# Patient Record
Sex: Male | Born: 1945
Health system: Southern US, Community
[De-identification: ages and names within clinical notes are randomized; demographics above are authoritative.]

## PROBLEM LIST (undated history)

## (undated) DIAGNOSIS — Z5189 Encounter for other specified aftercare: Secondary | ICD-10-CM

## (undated) DIAGNOSIS — H409 Unspecified glaucoma: Secondary | ICD-10-CM

## (undated) DIAGNOSIS — Z87442 Personal history of urinary calculi: Secondary | ICD-10-CM

## (undated) DIAGNOSIS — M1990 Unspecified osteoarthritis, unspecified site: Secondary | ICD-10-CM

## (undated) DIAGNOSIS — N400 Enlarged prostate without lower urinary tract symptoms: Secondary | ICD-10-CM

## (undated) DIAGNOSIS — T7840XA Allergy, unspecified, initial encounter: Secondary | ICD-10-CM

## (undated) DIAGNOSIS — C801 Malignant (primary) neoplasm, unspecified: Secondary | ICD-10-CM

## (undated) DIAGNOSIS — Z8719 Personal history of other diseases of the digestive system: Secondary | ICD-10-CM

## (undated) DIAGNOSIS — E785 Hyperlipidemia, unspecified: Secondary | ICD-10-CM

## (undated) DIAGNOSIS — I219 Acute myocardial infarction, unspecified: Secondary | ICD-10-CM

## (undated) DIAGNOSIS — N200 Calculus of kidney: Secondary | ICD-10-CM

## (undated) HISTORY — PX: OTHER SURGICAL HISTORY: SHX169

## (undated) HISTORY — DX: Benign prostatic hyperplasia without lower urinary tract symptoms: N40.0

## (undated) HISTORY — DX: Allergy, unspecified, initial encounter: T78.40XA

## (undated) HISTORY — DX: Unspecified glaucoma: H40.9

## (undated) HISTORY — DX: Acute myocardial infarction, unspecified: I21.9

## (undated) HISTORY — DX: Hyperlipidemia, unspecified: E78.5

## (undated) HISTORY — DX: Personal history of other diseases of the digestive system: Z87.19

## (undated) HISTORY — PX: COLONOSCOPY: SHX174

## (undated) HISTORY — DX: Encounter for other specified aftercare: Z51.89

## (undated) HISTORY — DX: Calculus of kidney: N20.0

## (undated) MED FILL — Dexamethasone Sodium Phosphate Inj 100 MG/10ML: INTRAMUSCULAR | Qty: 1 | Status: AC

---

## 2000-07-26 ENCOUNTER — Emergency Department (HOSPITAL_COMMUNITY): Admission: EM | Admit: 2000-07-26 | Discharge: 2000-07-26 | Payer: Self-pay | Admitting: Emergency Medicine

## 2000-11-27 ENCOUNTER — Other Ambulatory Visit: Admission: RE | Admit: 2000-11-27 | Discharge: 2000-11-27 | Payer: Self-pay | Admitting: Gastroenterology

## 2000-11-27 ENCOUNTER — Encounter (INDEPENDENT_AMBULATORY_CARE_PROVIDER_SITE_OTHER): Payer: Self-pay | Admitting: Specialist

## 2002-05-13 ENCOUNTER — Encounter: Admission: RE | Admit: 2002-05-13 | Discharge: 2002-05-13 | Payer: Self-pay | Admitting: Internal Medicine

## 2002-05-13 ENCOUNTER — Encounter: Payer: Self-pay | Admitting: Internal Medicine

## 2002-05-13 ENCOUNTER — Ambulatory Visit (HOSPITAL_COMMUNITY): Admission: RE | Admit: 2002-05-13 | Discharge: 2002-05-13 | Payer: Self-pay | Admitting: Internal Medicine

## 2003-08-01 ENCOUNTER — Emergency Department (HOSPITAL_COMMUNITY): Admission: EM | Admit: 2003-08-01 | Discharge: 2003-08-01 | Payer: Self-pay | Admitting: Family Medicine

## 2004-01-19 ENCOUNTER — Ambulatory Visit: Payer: Self-pay | Admitting: Gastroenterology

## 2004-01-29 ENCOUNTER — Ambulatory Visit: Payer: Self-pay | Admitting: Gastroenterology

## 2008-12-22 ENCOUNTER — Encounter (INDEPENDENT_AMBULATORY_CARE_PROVIDER_SITE_OTHER): Payer: Self-pay | Admitting: *Deleted

## 2009-01-12 ENCOUNTER — Encounter (INDEPENDENT_AMBULATORY_CARE_PROVIDER_SITE_OTHER): Payer: Self-pay | Admitting: *Deleted

## 2009-01-13 ENCOUNTER — Encounter (INDEPENDENT_AMBULATORY_CARE_PROVIDER_SITE_OTHER): Payer: Self-pay | Admitting: *Deleted

## 2009-01-13 ENCOUNTER — Ambulatory Visit: Payer: Self-pay | Admitting: Gastroenterology

## 2009-01-27 ENCOUNTER — Ambulatory Visit: Payer: Self-pay | Admitting: Gastroenterology

## 2009-02-10 ENCOUNTER — Encounter: Payer: Self-pay | Admitting: Gastroenterology

## 2010-05-18 LAB — GLUCOSE, CAPILLARY
Glucose-Capillary: 117 mg/dL — ABNORMAL HIGH (ref 70–99)
Glucose-Capillary: 127 mg/dL — ABNORMAL HIGH (ref 70–99)

## 2011-03-16 ENCOUNTER — Encounter (HOSPITAL_COMMUNITY): Payer: Self-pay | Admitting: Pharmacy Technician

## 2011-03-21 ENCOUNTER — Other Ambulatory Visit: Payer: Self-pay | Admitting: Orthopedic Surgery

## 2011-03-22 ENCOUNTER — Other Ambulatory Visit: Payer: Self-pay

## 2011-03-22 ENCOUNTER — Encounter (HOSPITAL_COMMUNITY)
Admission: RE | Admit: 2011-03-22 | Discharge: 2011-03-22 | Disposition: A | Discharge status: Home / Self Care | Payer: Medicare Other | Source: Ambulatory Visit | Attending: Orthopedic Surgery | Admitting: Orthopedic Surgery

## 2011-03-22 ENCOUNTER — Encounter (HOSPITAL_COMMUNITY): Payer: Self-pay

## 2011-03-22 HISTORY — DX: Unspecified osteoarthritis, unspecified site: M19.90

## 2011-03-22 LAB — COMPREHENSIVE METABOLIC PANEL
ALT: 20 U/L (ref 0–53)
Albumin: 4.2 g/dL (ref 3.5–5.2)
Alkaline Phosphatase: 57 U/L (ref 39–117)
Glucose, Bld: 92 mg/dL (ref 70–99)
Potassium: 4.2 mEq/L (ref 3.5–5.1)
Sodium: 138 mEq/L (ref 135–145)
Total Protein: 8 g/dL (ref 6.0–8.3)

## 2011-03-22 LAB — URINALYSIS, ROUTINE W REFLEX MICROSCOPIC
Bilirubin Urine: NEGATIVE
Hgb urine dipstick: NEGATIVE
Nitrite: NEGATIVE
Specific Gravity, Urine: 1.026 (ref 1.005–1.030)
pH: 5 (ref 5.0–8.0)

## 2011-03-22 LAB — DIFFERENTIAL
Eosinophils Absolute: 0.2 10*3/uL (ref 0.0–0.7)
Eosinophils Relative: 2 % (ref 0–5)
Lymphs Abs: 2.3 10*3/uL (ref 0.7–4.0)
Monocytes Absolute: 0.6 10*3/uL (ref 0.1–1.0)
Monocytes Relative: 7 % (ref 3–12)

## 2011-03-22 LAB — CBC
Hemoglobin: 13.2 g/dL (ref 13.0–17.0)
MCHC: 32.2 g/dL (ref 30.0–36.0)
RDW: 14.7 % (ref 11.5–15.5)
WBC: 8 10*3/uL (ref 4.0–10.5)

## 2011-03-22 LAB — APTT: aPTT: 34 seconds (ref 24–37)

## 2011-03-22 MED ORDER — CHLORHEXIDINE GLUCONATE 4 % EX LIQD: 60.0000 mL | Freq: Once | CUTANEOUS | Status: DC

## 2011-03-22 MED ORDER — SODIUM CHLORIDE 0.9 % IV SOLN: INTRAVENOUS | Status: DC

## 2011-03-22 NOTE — Progress Notes (Signed)
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 AND WOULD PREFER NOT TO WEAR THE BLUE BLOOD BAND FOR  13 DAYS...WILL DRAW DOS

## 2011-03-22 NOTE — Pre-Procedure Instructions (Signed)
20 David Kelly  03/22/2011   Your procedure is scheduled on: Monday, FEB 18th  Report to [facility] at  5:30 AM.  Call this number if you have problems the morning of surgery: [phone]   Remember:   Do not eat food:After Midnight Tuesday.  May have clear liquids: up to 4 Hours before arrival time  (1:30AM).  Clear liquids include soda, tea, black coffee, apple or grape juice, broth.   Take these medicines the morning of surgery with A SIP OF WATER: METOPROLOL   Do not wear jewelry, make-up or nail polish.   Do not wear lotions, powders, or perfumes. You may wear deodorant.   Do not bring valuables to the hospital.   Contacts, dentures or bridgework may not be worn into surgery.  Leave suitcase in the car. After surgery it may be brought to your room.  For patients admitted to the hospital, checkout time is 11:00 AM the day of discharge.   Patients discharged the day of surgery will not be allowed to drive home.  Name and phone number of your driver:  David Kelly  --  [phone]*  Special Instructions: CHG Shower Use Special Wash: 1/2 bottle night before surgery and 1/2 bottle morning of surgery.   Please read over the following fact sheets that you were given: Pain Booklet, MRSA Information and Surgical Site Infection Prevention

## 2011-03-23 LAB — URINE CULTURE
Culture  Setup Time: 201302051154
Culture: NO GROWTH

## 2011-03-30 ENCOUNTER — Other Ambulatory Visit: Payer: Self-pay | Admitting: Orthopedic Surgery

## 2011-03-30 NOTE — H&P (Signed)
  David Kelly MRN:  [id number] DOB/SEX:  [birth year]/male  CHIEF COMPLAINT:  Painful left Knee  HISTORY: Patient is a 66 y.o. male presented with a history of pain in the left knee. Onset of symptoms was gradual starting several years ago with gradually worsening course since that time. The patient noted no past surgery on the left knee. Prior procedures on the knee include meniscectomy. Patient has been treated conservatively with over-the-counter NSAIDs and activity modification. Patient currently rates pain in the knee at 8 out of 10 with activity. There is pain at night.  PAST MEDICAL HISTORY: There are no active problems to display for this patient.  Past Medical History  Diagnosis Date  . Bronchitis, chronic   . Diabetes mellitus     DX  5 YR AGO  . Arthritis    No past surgical history on file.   MEDICATIONS:   (Not in a hospital admission)  ALLERGIES:  No Known Allergies  REVIEW OF SYSTEMS:  Pertinent items are noted in HPI.   FAMILY HISTORY:  No family history on file.  SOCIAL HISTORY:   History  Substance Use Topics  . Smoking status: Former Smoker -- 1.5 packs/day for 45 years    Types: Cigarettes    Quit date: 03/21/2006  . Smokeless tobacco: Not on file  . Alcohol Use: No     EXAMINATION:  Vital signs in last 24 hours: @VSRANGES @  General appearance: alert, cooperative and no distress Lungs: clear to auscultation bilaterally Heart: regular rate and rhythm, S1, S2 normal, no murmur, click, rub or gallop Abdomen: soft, non-tender; bowel sounds normal; no masses,  no organomegaly Extremities: Homans sign is negative, no sign of DVT Pulses: 2+ and symmetric Skin: Skin color, texture, turgor normal. No rashes or lesions Neurologic: Alert and oriented X 3, normal strength and tone. Normal symmetric reflexes. Normal coordination and gait  Musculoskeletal:  ROM 0-90, Ligaments INTACT,  Imaging Review Plain radiographs demonstrate severe degenerative  joint disease of the left knee. The overall alignment is mild varus. The bone quality appears to be good for age and reported activity level.  Assessment/Plan: End stage arthritis, left knee   The patient history, physical examination and imaging studies are consistent with advanced degenerative joint disease of the left knee. The patient has failed conservative treatment.  The clearance notes were reviewed.  After discussion with the patient it was felt that Total Knee Replacement was indicated. The procedure,  risks, and benefits of total knee arthroplasty were presented and reviewed. The risks including but not limited to aseptic loosening, infection, blood clots, vascular injury, stiffness, patella tracking problems complications among others were discussed. The patient acknowledged the explanation, agreed to proceed with the plan.  [name] 03/30/2011, 9:19 AM

## 2011-04-03 MED ORDER — CEFAZOLIN SODIUM-DEXTROSE 2-3 GM-% IV SOLR
2.0000 g | INTRAVENOUS | Status: AC
  Administered 2011-04-04: 2 g via INTRAVENOUS
  Filled 2011-04-03: qty 50

## 2011-04-03 MED ORDER — ACETAMINOPHEN 10 MG/ML IV SOLN
1000.0000 mg | Freq: Four times a day (QID) | INTRAVENOUS | Status: DC
  Administered 2011-04-04: 1000 mg via INTRAVENOUS
  Filled 2011-04-03 (×4): qty 100

## 2011-04-04 ENCOUNTER — Inpatient Hospital Stay (HOSPITAL_COMMUNITY)
Admission: RE | Admit: 2011-04-04 | Discharge: 2011-04-06 | DRG: 470 | Disposition: A | Discharge status: Home Health | Payer: Medicare Other | Source: Ambulatory Visit | Attending: Orthopedic Surgery | Admitting: Orthopedic Surgery

## 2011-04-04 ENCOUNTER — Ambulatory Visit (HOSPITAL_COMMUNITY): Payer: Medicare Other | Admitting: Registered Nurse

## 2011-04-04 ENCOUNTER — Encounter (HOSPITAL_COMMUNITY): Payer: Self-pay | Admitting: Registered Nurse

## 2011-04-04 ENCOUNTER — Encounter (HOSPITAL_COMMUNITY): Payer: Self-pay | Admitting: Surgery

## 2011-04-04 ENCOUNTER — Encounter (HOSPITAL_COMMUNITY)
Admission: RE | Disposition: A | Discharge status: Home Health | Payer: Self-pay | Source: Ambulatory Visit | Attending: Orthopedic Surgery

## 2011-04-04 DIAGNOSIS — M1710 Unilateral primary osteoarthritis, unspecified knee: Principal | ICD-10-CM | POA: Diagnosis present

## 2011-04-04 DIAGNOSIS — M1712 Unilateral primary osteoarthritis, left knee: Secondary | ICD-10-CM

## 2011-04-04 DIAGNOSIS — Z23 Encounter for immunization: Secondary | ICD-10-CM

## 2011-04-04 DIAGNOSIS — Z79899 Other long term (current) drug therapy: Secondary | ICD-10-CM

## 2011-04-04 DIAGNOSIS — E119 Type 2 diabetes mellitus without complications: Secondary | ICD-10-CM | POA: Diagnosis present

## 2011-04-04 DIAGNOSIS — Z87891 Personal history of nicotine dependence: Secondary | ICD-10-CM

## 2011-04-04 DIAGNOSIS — D62 Acute posthemorrhagic anemia: Secondary | ICD-10-CM | POA: Diagnosis not present

## 2011-04-04 HISTORY — PX: TOTAL KNEE ARTHROPLASTY: SHX125

## 2011-04-04 LAB — GLUCOSE, CAPILLARY
Glucose-Capillary: 126 mg/dL — ABNORMAL HIGH (ref 70–99)
Glucose-Capillary: 114 mg/dL — ABNORMAL HIGH (ref 70–99)

## 2011-04-04 LAB — TYPE AND SCREEN: Antibody Screen: NEGATIVE

## 2011-04-04 LAB — ABO/RH: ABO/RH(D): A POS

## 2011-04-04 SURGERY — ARTHROPLASTY, KNEE, TOTAL
Anesthesia: General | Site: Knee | Laterality: Left | Wound class: Clean

## 2011-04-04 MED ORDER — INFLUENZA VIRUS VACC SPLIT PF IM SUSP
0.5000 mL | INTRAMUSCULAR | Status: AC
  Filled 2011-04-04: qty 0.5

## 2011-04-04 MED ORDER — PROPOFOL 10 MG/ML IV EMUL
INTRAVENOUS | Status: DC | PRN
  Administered 2011-04-04: 200 mg via INTRAVENOUS

## 2011-04-04 MED ORDER — METFORMIN HCL 850 MG PO TABS
850.0000 mg | ORAL_TABLET | Freq: Two times a day (BID) | ORAL | Status: DC
  Administered 2011-04-04 – 2011-04-06 (×4): 850 mg via ORAL
  Filled 2011-04-04 (×6): qty 1

## 2011-04-04 MED ORDER — SENNOSIDES-DOCUSATE SODIUM 8.6-50 MG PO TABS: 1.0000 | ORAL_TABLET | Freq: Every evening | ORAL | Status: DC | PRN

## 2011-04-04 MED ORDER — OXYCODONE HCL 10 MG PO TB12
10.0000 mg | ORAL_TABLET | Freq: Two times a day (BID) | ORAL | Status: DC
  Administered 2011-04-04 – 2011-04-06 (×4): 10 mg via ORAL
  Filled 2011-04-04 (×4): qty 1

## 2011-04-04 MED ORDER — DIPHENHYDRAMINE HCL 12.5 MG/5ML PO ELIX
12.5000 mg | ORAL_SOLUTION | ORAL | Status: DC | PRN
Start: 2011-04-04 — End: 2011-04-06
  Filled 2011-04-04: qty 10

## 2011-04-04 MED ORDER — SODIUM CHLORIDE 0.9 % IV SOLN
INTRAVENOUS | Status: DC
  Administered 2011-04-04: 1000 mL via INTRAVENOUS
  Administered 2011-04-04 – 2011-04-05 (×2): via INTRAVENOUS

## 2011-04-04 MED ORDER — BUPIVACAINE-EPINEPHRINE PF 0.5-1:200000 % IJ SOLN
INTRAMUSCULAR | Status: DC | PRN
  Administered 2011-04-04: 150 mg

## 2011-04-04 MED ORDER — ONDANSETRON HCL 4 MG PO TABS: 4.0000 mg | ORAL_TABLET | Freq: Four times a day (QID) | ORAL | Status: DC | PRN

## 2011-04-04 MED ORDER — ACETAMINOPHEN 10 MG/ML IV SOLN
INTRAVENOUS | Status: AC
  Filled 2011-04-04: qty 100

## 2011-04-04 MED ORDER — BUPIVACAINE-EPINEPHRINE 0.25% -1:200000 IJ SOLN
INTRAMUSCULAR | Status: DC | PRN
  Administered 2011-04-04: 20 mL

## 2011-04-04 MED ORDER — DOCUSATE SODIUM 100 MG PO CAPS
100.0000 mg | ORAL_CAPSULE | Freq: Two times a day (BID) | ORAL | Status: DC
  Administered 2011-04-04 – 2011-04-06 (×4): 100 mg via ORAL
  Filled 2011-04-04 (×6): qty 1

## 2011-04-04 MED ORDER — BUPIVACAINE 0.25 % ON-Q PUMP SINGLE CATH 300ML
300.0000 mL | INJECTION | Status: AC
  Administered 2011-04-04: 300 mL
  Filled 2011-04-04: qty 300

## 2011-04-04 MED ORDER — METOCLOPRAMIDE HCL 5 MG/ML IJ SOLN
5.0000 mg | Freq: Three times a day (TID) | INTRAMUSCULAR | Status: DC | PRN
  Filled 2011-04-04: qty 2

## 2011-04-04 MED ORDER — SODIUM CHLORIDE 0.9 % IR SOLN
Status: DC | PRN
  Administered 2011-04-04: 1000 mL

## 2011-04-04 MED ORDER — PHENOL 1.4 % MT LIQD
1.0000 | OROMUCOSAL | Status: DC | PRN
  Filled 2011-04-04: qty 177

## 2011-04-04 MED ORDER — METHOCARBAMOL 500 MG PO TABS
500.0000 mg | ORAL_TABLET | Freq: Four times a day (QID) | ORAL | Status: DC | PRN
  Administered 2011-04-05 – 2011-04-06 (×2): 500 mg via ORAL
  Filled 2011-04-04 (×3): qty 1

## 2011-04-04 MED ORDER — DROPERIDOL 2.5 MG/ML IJ SOLN: 0.6250 mg | INTRAMUSCULAR | Status: DC | PRN

## 2011-04-04 MED ORDER — ALUM & MAG HYDROXIDE-SIMETH 200-200-20 MG/5ML PO SUSP: 30.0000 mL | ORAL | Status: DC | PRN

## 2011-04-04 MED ORDER — ACETAMINOPHEN 325 MG PO TABS: 650.0000 mg | ORAL_TABLET | Freq: Four times a day (QID) | ORAL | Status: DC | PRN

## 2011-04-04 MED ORDER — METOPROLOL TARTRATE 25 MG PO TABS
25.0000 mg | ORAL_TABLET | Freq: Two times a day (BID) | ORAL | Status: DC
  Administered 2011-04-04 – 2011-04-06 (×3): 25 mg via ORAL
  Filled 2011-04-04 (×5): qty 1

## 2011-04-04 MED ORDER — BUPIVACAINE ON-Q PAIN PUMP (FOR ORDER SET NO CHG)
INJECTION | Status: DC
  Filled 2011-04-04: qty 1

## 2011-04-04 MED ORDER — OXYCODONE HCL 5 MG PO TABS
5.0000 mg | ORAL_TABLET | ORAL | Status: DC | PRN
  Administered 2011-04-05 – 2011-04-06 (×5): 10 mg via ORAL
  Filled 2011-04-04 (×6): qty 2

## 2011-04-04 MED ORDER — BISACODYL 5 MG PO TBEC: 5.0000 mg | DELAYED_RELEASE_TABLET | Freq: Every day | ORAL | Status: DC | PRN

## 2011-04-04 MED ORDER — ONDANSETRON HCL 4 MG/2ML IJ SOLN
INTRAMUSCULAR | Status: DC | PRN
  Administered 2011-04-04: 4 mg via INTRAVENOUS

## 2011-04-04 MED ORDER — MENTHOL 3 MG MT LOZG: 1.0000 | LOZENGE | OROMUCOSAL | Status: DC | PRN

## 2011-04-04 MED ORDER — FENTANYL CITRATE 0.05 MG/ML IJ SOLN
INTRAMUSCULAR | Status: DC | PRN
  Administered 2011-04-04: 100 ug via INTRAVENOUS
  Administered 2011-04-04 (×3): 50 ug via INTRAVENOUS

## 2011-04-04 MED ORDER — LACTATED RINGERS IV SOLN
INTRAVENOUS | Status: DC | PRN
  Administered 2011-04-04 (×2): via INTRAVENOUS

## 2011-04-04 MED ORDER — ZOLPIDEM TARTRATE 5 MG PO TABS: 5.0000 mg | ORAL_TABLET | Freq: Every evening | ORAL | Status: DC | PRN

## 2011-04-04 MED ORDER — SIMVASTATIN 20 MG PO TABS
20.0000 mg | ORAL_TABLET | Freq: Every day | ORAL | Status: DC
  Administered 2011-04-04 – 2011-04-05 (×2): 20 mg via ORAL
  Filled 2011-04-04 (×3): qty 1

## 2011-04-04 MED ORDER — ACETAMINOPHEN 650 MG RE SUPP: 650.0000 mg | Freq: Four times a day (QID) | RECTAL | Status: DC | PRN

## 2011-04-04 MED ORDER — CELECOXIB 200 MG PO CAPS
200.0000 mg | ORAL_CAPSULE | Freq: Two times a day (BID) | ORAL | Status: DC
  Administered 2011-04-04 – 2011-04-06 (×4): 200 mg via ORAL
  Filled 2011-04-04 (×6): qty 1

## 2011-04-04 MED ORDER — MIDAZOLAM HCL 5 MG/5ML IJ SOLN
INTRAMUSCULAR | Status: DC | PRN
  Administered 2011-04-04 (×2): 1 mg via INTRAVENOUS

## 2011-04-04 MED ORDER — HYDROMORPHONE HCL PF 1 MG/ML IJ SOLN
0.2500 mg | INTRAMUSCULAR | Status: DC | PRN
  Administered 2011-04-04 (×2): 0.5 mg via INTRAVENOUS

## 2011-04-04 MED ORDER — ONDANSETRON HCL 4 MG/2ML IJ SOLN: 4.0000 mg | Freq: Four times a day (QID) | INTRAMUSCULAR | Status: DC | PRN

## 2011-04-04 MED ORDER — DEXTROSE 5 % IV SOLN
500.0000 mg | Freq: Four times a day (QID) | INTRAVENOUS | Status: DC | PRN
  Filled 2011-04-04: qty 5

## 2011-04-04 MED ORDER — METOCLOPRAMIDE HCL 10 MG PO TABS: 5.0000 mg | ORAL_TABLET | Freq: Three times a day (TID) | ORAL | Status: DC | PRN

## 2011-04-04 MED ORDER — HYDROMORPHONE HCL PF 1 MG/ML IJ SOLN
0.5000 mg | INTRAMUSCULAR | Status: DC | PRN
  Administered 2011-04-04 – 2011-04-05 (×4): 1 mg via INTRAVENOUS
  Filled 2011-04-04 (×4): qty 1

## 2011-04-04 MED ORDER — CEFAZOLIN SODIUM-DEXTROSE 2-3 GM-% IV SOLR
2.0000 g | Freq: Four times a day (QID) | INTRAVENOUS | Status: AC
  Administered 2011-04-04 – 2011-04-05 (×3): 2 g via INTRAVENOUS
  Filled 2011-04-04 (×3): qty 50

## 2011-04-04 MED ORDER — FLEET ENEMA 7-19 GM/118ML RE ENEM: 1.0000 | ENEMA | Freq: Once | RECTAL | Status: AC | PRN

## 2011-04-04 MED ORDER — ENOXAPARIN SODIUM 40 MG/0.4ML ~~LOC~~ SOLN
40.0000 mg | SUBCUTANEOUS | Status: DC
  Administered 2011-04-05 – 2011-04-06 (×2): 40 mg via SUBCUTANEOUS
  Filled 2011-04-04 (×3): qty 0.4

## 2011-04-04 SURGICAL SUPPLY — 63 items
BANDAGE ACE 4 STERILE (GAUZE/BANDAGES/DRESSINGS) ×1 IMPLANT
BANDAGE ELASTIC 6 VELCRO ST LF (GAUZE/BANDAGES/DRESSINGS) ×1 IMPLANT
BANDAGE ESMARK 6X9 LF (GAUZE/BANDAGES/DRESSINGS) ×1 IMPLANT
BLADE SAGITTAL 13X1.27X60 (BLADE) ×2 IMPLANT
BLADE SAW SGTL 83.5X18.5 (BLADE) ×2 IMPLANT
BNDG CMPR 9X6 STRL LF SNTH (GAUZE/BANDAGES/DRESSINGS) ×1
BNDG ESMARK 6X9 LF (GAUZE/BANDAGES/DRESSINGS) ×2
BOWL SMART MIX CTS (DISPOSABLE) ×2 IMPLANT
CATH KIT ON Q 10IN SLV (PAIN MANAGEMENT) ×2 IMPLANT
CEMENT BONE SIMPLEX SPEEDSET (Cement) ×4 IMPLANT
CLOTH BEACON ORANGE TIMEOUT ST (SAFETY) ×2 IMPLANT
COVER BACK TABLE 24X17X13 BIG (DRAPES) ×1 IMPLANT
COVER SURGICAL LIGHT HANDLE (MISCELLANEOUS) ×3 IMPLANT
CUFF TOURNIQUET SINGLE 34IN LL (TOURNIQUET CUFF) ×2 IMPLANT
DRAPE EXTREMITY T 121X128X90 (DRAPE) ×2 IMPLANT
DRAPE INCISE IOBAN 66X45 STRL (DRAPES) ×4 IMPLANT
DRAPE PROXIMA HALF (DRAPES) ×2 IMPLANT
DRAPE U-SHAPE 47X51 STRL (DRAPES) ×2 IMPLANT
DRSG ADAPTIC 3X8 NADH LF (GAUZE/BANDAGES/DRESSINGS) ×2 IMPLANT
DRSG PAD ABDOMINAL 8X10 ST (GAUZE/BANDAGES/DRESSINGS) ×2 IMPLANT
DURAPREP 26ML APPLICATOR (WOUND CARE) ×4 IMPLANT
ELECT REM PT RETURN 9FT ADLT (ELECTROSURGICAL) ×2
ELECTRODE REM PT RTRN 9FT ADLT (ELECTROSURGICAL) ×1 IMPLANT
EVACUATOR 1/8 PVC DRAIN (DRAIN) ×2 IMPLANT
GLOVE BIO SURGEON STRL SZ8.5 (GLOVE) ×1 IMPLANT
GLOVE BIOGEL M 7.0 STRL (GLOVE) ×1 IMPLANT
GLOVE BIOGEL PI IND STRL 7.5 (GLOVE) IMPLANT
GLOVE BIOGEL PI IND STRL 8.5 (GLOVE) ×2 IMPLANT
GLOVE BIOGEL PI INDICATOR 7.5 (GLOVE) ×1
GLOVE BIOGEL PI INDICATOR 8.5 (GLOVE) ×2
GLOVE SURG ORTHO 8.0 STRL STRW (GLOVE) ×4 IMPLANT
GLOVE SURG SS PI 7.5 STRL IVOR (GLOVE) ×1 IMPLANT
GLOVE SURG SS PI 8.5 STRL IVOR (GLOVE) ×1
GLOVE SURG SS PI 8.5 STRL STRW (GLOVE) IMPLANT
GOWN PREVENTION PLUS XLARGE (GOWN DISPOSABLE) ×4 IMPLANT
GOWN PREVENTION PLUS XXLARGE (GOWN DISPOSABLE) ×1 IMPLANT
GOWN STRL NON-REIN LRG LVL3 (GOWN DISPOSABLE) ×3 IMPLANT
HANDPIECE INTERPULSE COAX TIP (DISPOSABLE) ×2
HOOD PEEL AWAY FACE SHEILD DIS (HOOD) ×8 IMPLANT
KIT BASIN OR (CUSTOM PROCEDURE TRAY) ×2 IMPLANT
KIT ROOM TURNOVER OR (KITS) ×2 IMPLANT
MANIFOLD NEPTUNE II (INSTRUMENTS) ×2 IMPLANT
NEEDLE 22X1 1/2 (OR ONLY) (NEEDLE) ×1 IMPLANT
NS IRRIG 1000ML POUR BTL (IV SOLUTION) ×2 IMPLANT
PACK TOTAL JOINT (CUSTOM PROCEDURE TRAY) ×2 IMPLANT
PAD ARMBOARD 7.5X6 YLW CONV (MISCELLANEOUS) ×4 IMPLANT
PADDING CAST COTTON 6X4 STRL (CAST SUPPLIES) ×2 IMPLANT
POSITIONER HEAD PRONE TRACH (MISCELLANEOUS) ×2 IMPLANT
SET HNDPC FAN SPRY TIP SCT (DISPOSABLE) ×1 IMPLANT
SPONGE GAUZE 4X4 12PLY (GAUZE/BANDAGES/DRESSINGS) ×2 IMPLANT
STAPLER VISISTAT 35W (STAPLE) ×2 IMPLANT
SUCTION FRAZIER TIP 10 FR DISP (SUCTIONS) ×2 IMPLANT
SUT BONE WAX W31G (SUTURE) ×2 IMPLANT
SUT VIC AB 0 CTB1 27 (SUTURE) ×4 IMPLANT
SUT VIC AB 1 CT1 27 (SUTURE) ×8
SUT VIC AB 1 CT1 27XBRD ANBCTR (SUTURE) ×4 IMPLANT
SUT VIC AB 2-0 CT1 27 (SUTURE) ×4
SUT VIC AB 2-0 CT1 TAPERPNT 27 (SUTURE) ×2 IMPLANT
SYR CONTROL 10ML LL (SYRINGE) ×1 IMPLANT
TOWEL OR 17X24 6PK STRL BLUE (TOWEL DISPOSABLE) ×2 IMPLANT
TOWEL OR 17X26 10 PK STRL BLUE (TOWEL DISPOSABLE) ×2 IMPLANT
TRAY FOLEY CATH 14FR (SET/KITS/TRAYS/PACK) ×1 IMPLANT
WATER STERILE IRR 1000ML POUR (IV SOLUTION) ×5 IMPLANT

## 2011-04-04 NOTE — Anesthesia Postprocedure Evaluation (Signed)
Anesthesia Post Note  Patient: David Kelly  Procedure(s) Performed: Procedure(s) (LRB): TOTAL KNEE ARTHROPLASTY (Left)  Anesthesia type: general  Patient location: PACU  Post pain: Pain level controlled  Post assessment: Patient's Cardiovascular Status Stable  Last Vitals:  Filed Vitals:   04/04/11 1000  BP:   Pulse: 81  Temp:   Resp: 13    Post vital signs: Reviewed and stable  Level of consciousness: sedated  Complications: No apparent anesthesia complications

## 2011-04-04 NOTE — H&P (View-Only) (Signed)
  David Kelly MRN:  [id number] DOB/SEX:  10/18/1945/male  CHIEF COMPLAINT:  Painful left Knee  HISTORY: Patient is a 66 y.o. male presented with a history of pain in the left knee. Onset of symptoms was gradual starting several years ago with gradually worsening course since that time. The patient noted no past surgery on the left knee. Prior procedures on the knee include meniscectomy. Patient has been treated conservatively with over-the-counter NSAIDs and activity modification. Patient currently rates pain in the knee at 8 out of 10 with activity. There is pain at night.  PAST MEDICAL HISTORY: There are no active problems to display for this patient.  Past Medical History  Diagnosis Date  . Bronchitis, chronic   . Diabetes mellitus     DX  5 YR AGO  . Arthritis    No past surgical history on file.   MEDICATIONS:   (Not in a hospital admission)  ALLERGIES:  No Known Allergies  REVIEW OF SYSTEMS:  Pertinent items are noted in HPI.   FAMILY HISTORY:  No family history on file.  SOCIAL HISTORY:   History  Substance Use Topics  . Smoking status: Former Smoker -- 1.5 packs/day for 45 years    Types: Cigarettes    Quit date: 03/21/2006  . Smokeless tobacco: Not on file  . Alcohol Use: No     EXAMINATION:  Vital signs in last 24 hours: @VSRANGES@  General appearance: alert, cooperative and no distress Lungs: clear to auscultation bilaterally Heart: regular rate and rhythm, S1, S2 normal, no murmur, click, rub or gallop Abdomen: soft, non-tender; bowel sounds normal; no masses,  no organomegaly Extremities: Homans sign is negative, no sign of DVT Pulses: 2+ and symmetric Skin: Skin color, texture, turgor normal. No rashes or lesions Neurologic: Alert and oriented X 3, normal strength and tone. Normal symmetric reflexes. Normal coordination and gait  Musculoskeletal:  ROM 0-90, Ligaments INTACT,  Imaging Review Plain radiographs demonstrate severe degenerative  joint disease of the left knee. The overall alignment is mild varus. The bone quality appears to be good for age and reported activity level.  Assessment/Plan: End stage arthritis, left knee   The patient history, physical examination and imaging studies are consistent with advanced degenerative joint disease of the left knee. The patient has failed conservative treatment.  The clearance notes were reviewed.  After discussion with the patient it was felt that Total Knee Replacement was indicated. The procedure,  risks, and benefits of total knee arthroplasty were presented and reviewed. The risks including but not limited to aseptic loosening, infection, blood clots, vascular injury, stiffness, patella tracking problems complications among others were discussed. The patient acknowledged the explanation, agreed to proceed with the plan.  David Kelly 03/30/2011, 9:19 AM   

## 2011-04-04 NOTE — Progress Notes (Signed)
Orthopedic Tech Progress Note Patient Details:  David Kelly June 06, 1945 [id number]  CPM Left Knee CPM Left Knee: On Left Knee Flexion (Degrees): 90  Left Knee Extension (Degrees): 0    David Kelly 04/04/2011, 10:47 AM

## 2011-04-04 NOTE — Anesthesia Preprocedure Evaluation (Signed)
Anesthesia Evaluation  Patient identified by MRN, date of birth, ID band Patient awake    Reviewed: Allergy & Precautions, H&P , NPO status , Patient's Chart, lab work & pertinent test results, reviewed documented beta blocker date and time   History of Anesthesia Complications Negative for: history of anesthetic complications  Airway Mallampati: II TM Distance: >3 FB Neck ROM: Full    Dental  (+) Poor Dentition and Dental Advisory Given   Pulmonary former smoker clear to auscultation  Pulmonary exam normal       Cardiovascular neg cardio ROS Regular Normal- Systolic murmurs    Neuro/Psych Negative Neurological ROS  Negative Psych ROS   GI/Hepatic negative GI ROS, Neg liver ROS,   Endo/Other  Diabetes mellitus-, Type 2, Oral Hypoglycemic Agents  Renal/GU negative Renal ROS     Musculoskeletal   Abdominal   Peds  Hematology   Anesthesia Other Findings   Reproductive/Obstetrics                           Anesthesia Physical Anesthesia Plan  ASA: II  Anesthesia Plan: General   Post-op Pain Management:    Induction: Intravenous  Airway Management Planned: LMA  Additional Equipment:   Intra-op Plan:   Post-operative Plan:   Informed Consent: I have reviewed the patients History and Physical, chart, labs and discussed the procedure including the risks, benefits and alternatives for the proposed anesthesia with the patient or authorized representative who has indicated his/her understanding and acceptance.   Dental advisory given  Plan Discussed with: CRNA, Anesthesiologist and Surgeon  Anesthesia Plan Comments:         Anesthesia Quick Evaluation

## 2011-04-04 NOTE — Progress Notes (Signed)
Orthopedic Tech Progress Note Patient Details:  JATAVIUS ELLENWOOD Feb 06, 1946 [id number]  Patient ID: Delene Ruffini, male   DOB: 03-29-1945, 66 y.o.   MRN: [id number]   Shawnie Pons 04/04/2011, 10:47 AM Trapeze bar

## 2011-04-04 NOTE — Progress Notes (Signed)
Pt took Metformin this AM.  Pt's CBG this AM was 103.  Notified anesthesia per note on chart.//L. LLove,RN

## 2011-04-04 NOTE — Interval H&P Note (Signed)
History and Physical Interval Note:  04/04/2011 7:14 AM  David Kelly  has presented today for surgery, with the diagnosis of OA Left knee   The various methods of treatment have been discussed with the patient and family. After consideration of risks, benefits and other options for treatment, the patient has consented to  Procedure(s) (LRB): TOTAL KNEE ARTHROPLASTY (Left) as a surgical intervention .  The patients' history has been reviewed, patient examined, no change in status, stable for surgery.  I have reviewed the patients' chart and labs.  Questions were answered to the patient's satisfaction.     [name],STEPHEN D

## 2011-04-04 NOTE — Plan of Care (Signed)
Problem: Consults Goal: Diagnosis- Total Joint Replacement Primary Total Knee     

## 2011-04-04 NOTE — Transfer of Care (Signed)
Immediate Anesthesia Transfer of Care Note  Patient: David Kelly  Procedure(s) Performed: Procedure(s) (LRB): TOTAL KNEE ARTHROPLASTY (Left)  Patient Location: PACU  Anesthesia Type: General  Level of Consciousness: awake and alert   Airway & Oxygen Therapy: Patient Spontanous Breathing and Patient connected to nasal cannula oxygen  Post-op Assessment: Report given to PACU RN  Post vital signs: Reviewed  Complications: No apparent anesthesia complications

## 2011-04-04 NOTE — Anesthesia Procedure Notes (Signed)
Anesthesia Regional Block:  Femoral nerve block  Pre-Anesthetic Checklist: ,, timeout performed, Correct Patient, Correct Site, Correct Laterality, Correct Procedure,, site marked, risks and benefits discussed, Surgical consent,  Pre-op evaluation,  At surgeon's request and post-op pain management  Laterality: Left  Prep: chloraprep       Needles:  Injection technique: Single-shot  Needle Type: Echogenic Stimulator Needle     Needle Length: 5cm 5 cm Needle Gauge: 22 and 22 G    Additional Needles:  Procedures: ultrasound guided and nerve stimulator Femoral nerve block  Nerve Stimulator or Paresthesia:  Response: quadraceps contraction, 0.45 mA,   Additional Responses:   Narrative:  Start time: 04/04/2011 7:02 AM End time: 04/04/2011 7:13 AM Injection made incrementally with aspirations every 5 mL.  Performed by: Personally  Anesthesiologist: Halford Decamp, MD  Additional Notes: Functioning IV was confirmed and monitors were applied.  A 50mm 22ga Arrow echogenic stimulator needle was used. Sterile prep and drape,hand hygiene and sterile gloves were used. Ultrasound guidance: relevent anatomy identified, needle position confirmed, local anesthetic spread visualized around nerve(s)., vascular puncture avoided.  Image printed for medical record. Negative aspiration and negative test dose prior to incremental administration of local anesthetic. The patient tolerated the procedure well.    Femoral nerve block

## 2011-04-04 NOTE — Preoperative (Signed)
Beta Blockers   Reason not to administer Beta Blockers:Not Applicable, Patient states Metoprolol was d/c back in January.

## 2011-04-05 LAB — CBC
HCT: 30.8 % — ABNORMAL LOW (ref 39.0–52.0)
Hemoglobin: 10 g/dL — ABNORMAL LOW (ref 13.0–17.0)
MCHC: 32.5 g/dL (ref 30.0–36.0)
MCV: 84.4 fL (ref 78.0–100.0)
RDW: 14.6 % (ref 11.5–15.5)

## 2011-04-05 LAB — GLUCOSE, CAPILLARY
Glucose-Capillary: 120 mg/dL — ABNORMAL HIGH (ref 70–99)
Glucose-Capillary: 121 mg/dL — ABNORMAL HIGH (ref 70–99)
Glucose-Capillary: 113 mg/dL — ABNORMAL HIGH (ref 70–99)

## 2011-04-05 LAB — BASIC METABOLIC PANEL
BUN: 8 mg/dL (ref 6–23)
Creatinine, Ser: 0.6 mg/dL (ref 0.50–1.35)
GFR calc Af Amer: >90 mL/min (ref >90)
GFR calc non Af Amer: >90 mL/min (ref >90)
Glucose, Bld: 144 mg/dL — ABNORMAL HIGH (ref 70–99)
Potassium: 4 mEq/L (ref 3.5–5.1)

## 2011-04-05 NOTE — Evaluation (Signed)
Occupational Therapy Evaluation Patient Details Name: David Kelly MRN: [id number] DOB: Jul 12, 1945 Today's Date: 04/05/2011  Problem List: There is no problem list on file for this patient.   Past Medical History:  Past Medical History  Diagnosis Date  . Bronchitis, chronic   . Diabetes mellitus     DX  5 YR AGO  . Arthritis    Past Surgical History: History reviewed. No pertinent past surgical history.  OT Assessment/Plan/Recommendation OT Assessment Clinical Impression Statement: Pt. presents s/p left TKA and with increased pain. Pt. will benefit from skilled OT to increase functional independence to supervision level at D/C OT Recommendation/Assessment: Patient will need skilled OT in the acute care venue OT Problem List: Decreased activity tolerance;Decreased safety awareness;Decreased knowledge of use of DME or AE;Pain Barriers to Discharge: None OT Therapy Diagnosis : Acute pain OT Plan OT Frequency: Min 2X/week OT Treatment/Interventions: Self-care/ADL training;DME and/or AE instruction;Therapeutic activities;Patient/family education OT Recommendation Follow Up Recommendations: Home health OT;Supervision - Intermittent Equipment Recommended: Tub/shower bench Individuals Consulted Consulted and Agree with Results and Recommendations: Patient OT Goals Acute Rehab OT Goals OT Goal Formulation: With patient Time For Goal Achievement: 7 days ADL Goals Pt Will Perform Grooming: with set-up;with supervision;Standing at sink ADL Goal: Grooming - Progress: Goal set today Pt Will Perform Lower Body Bathing: with set-up;with supervision;Sit to stand from chair;with adaptive equipment ADL Goal: Lower Body Bathing - Progress: Goal set today Pt Will Perform Lower Body Dressing: with set-up;with supervision;Sit to stand from chair;with adaptive equipment ADL Goal: Lower Body Dressing - Progress: Goal set today Pt Will Perform Tub/Shower Transfer: Tub transfer;with  supervision;with DME;Transfer tub bench ADL Goal: Tub/Shower Transfer - Progress: Goal set today  OT Evaluation Precautions/Restrictions  Precautions Precautions: Knee Required Braces or Orthoses: No Restrictions Weight Bearing Restrictions: Yes LLE Weight Bearing: Weight bearing as tolerated Prior Functioning Home Living Lives With: Alone Receives Help From: Friend(s) Type of Home: Mobile home Home Layout: One level Home Access: Stairs to enter Entrance Stairs-Rails: Right;Left;Can reach both Entrance Stairs-Number of Steps: 4 Bathroom Shower/Tub: Forensic scientist: Standard Bathroom Accessibility: Yes How Accessible: Accessible via walker Home Adaptive Equipment: Walker - rolling;Bedside commode/3-in-1 Prior Function Level of Independence: Independent with basic ADLs;Independent with gait;Independent with transfers Able to Take Stairs?: Yes Driving: Yes Vocation: Retired ADL ADL Eating/Feeding: Performed;Independent Where Assessed - Eating/Feeding: Chair Grooming: Performed;Wash/dry hands;Wash/dry face;Set up;Minimal assistance Grooming Details (indicate cue type and reason): Min assist to stand due to left knee buckling Upper Body Bathing: Simulated;Chest;Right arm;Left arm;Abdomen;Set up Where Assessed - Upper Body Bathing: Sitting, chair Lower Body Bathing: Simulated;Maximal assistance Lower Body Bathing Details (indicate cue type and reason): Educated on use of long handled sponge Where Assessed - Lower Body Bathing: Sit to stand from chair Upper Body Dressing: Simulated;Set up Where Assessed - Upper Body Dressing: Sitting, chair Lower Body Dressing: Performed;Minimal assistance Lower Body Dressing Details (indicate cue type and reason): With use of reacher and sock aid to don/doff socks and educated on technique for use of reacher to don/doff undergarments and pants Where Assessed - Lower Body Dressing: Sit to stand from chair Toilet  Transfer: Simulated;Minimal assistance Toilet Transfer Details (indicate cue type and reason): Mod verbal cues for hand placement and technique Toilet Transfer Method: Ambulating Toilet Transfer Equipment: Other (comment) (recliner) Toileting - Clothing Manipulation: Simulated;Minimal assistance Where Assessed - Toileting Clothing Manipulation: Sit to stand from 3-in-1 or toilet Toileting - Hygiene: Simulated;Minimal assistance Where Assessed - Toileting Hygiene: Sit to stand from 3-in-1 or toilet  Tub/Shower Transfer: Not assessed Tub/Shower Transfer Method: Not assessed Equipment Used: Rolling walker;Reacher;Sock aid Ambulation Related to ADLs: NA ADL Comments: Educated pt. on LB ADL techniques with use of AE to complete. Educated and demonstrated for pt. technique for tub transfer with use of bench.     Extremity Assessment RUE Assessment RUE Assessment: Within Functional Limits LUE Assessment LUE Assessment: Within Functional Limits Mobility  Bed Mobility Bed Mobility: No Transfers Transfers: Yes Sit to Stand: 4: Min assist;With armrests;From chair/3-in-1 Sit to Stand Details (indicate cue type and reason): Min verbal cues for hand placement and technique with use of RW    End of Session OT - End of Session Equipment Utilized During Treatment: Gait belt Activity Tolerance: Patient tolerated treatment well Patient left: in chair;with call bell in reach;with family/visitor present Nurse Communication: Mobility status for transfers General Behavior During Session: [facility] for tasks performed Cognition: [facility] for tasks performed   Jazmine Longshore, OTR/L Pager [phone] 04/05/2011, 9:40 AM

## 2011-04-05 NOTE — Progress Notes (Signed)
Physical Therapy Progress Note   04/05/11 1652  PT Visit Information  Last PT Received On 04/05/11  Precautions  Precautions Knee  Restrictions  LLE Weight Bearing WBAT  Bed Mobility  Sit to Supine 3: Mod assist;HOB flat  Sit to Supine - Details (indicate cue type and reason) physical assist to help LLE into bed  Transfers  Sit to Stand 4: Min assist;With upper extremity assist;From chair/3-in-1  Sit to Stand Details (indicate cue type and reason) cues for hand and LLe positioning  Stand to Sit 4: Min assist (guard assist)  Stand to Sit Details cues for safety and control  Ambulation/Gait  Ambulation/Gait Yes  Ambulation/Gait Assistance 4: Min assist  Ambulation/Gait Assistance Details (indicate cue type and reason) cues for sequence and to activate Lquad for stance stability  Ambulation Distance (Feet) 40 Feet (More pain this pm)  Assistive device Rolling walker  Gait Pattern Step-through pattern  Exercises  Exercises Total Joint (Pt politely declining therex; opted for CPM)  PT - End of Session  Equipment Utilized During Treatment Gait belt  Activity Tolerance Patient tolerated treatment well (even though he was in pain)  Patient left in bed;in CPM;with call bell in reach;with family/visitor present  General  Behavior During Session [facility] for tasks performed  Cognition [facility] for tasks performed  PT - Assessment/Plan  Comments on Treatment Session Pain limiting this session; but pt still participating; should be able to dc tomorrow (will likely need both PT sessions)  PT Plan Discharge plan remains appropriate  PT Frequency 7X/week  Follow Up Recommendations Home health PT;Supervision - Intermittent  Equipment Recommended Tub/shower bench  Acute Rehab PT Goals  Time For Goal Achievement 7 days  Pt will go Supine/Side to Sit with modified independence  PT Goal: Supine/Side to Sit - Progress Other (comment)  Pt will go Sit to Supine/Side with modified independence  PT Goal:  Sit to Supine/Side - Progress Progressing toward goal  Pt will go Sit to Stand with modified independence  PT Goal: Sit to Stand - Progress Progressing toward goal  Pt will go Stand to Sit with modified independence  PT Goal: Stand to Sit - Progress Progressing toward goal  Pt will Ambulate >150 feet;with modified independence;with rolling walker  PT Goal: Ambulate - Progress Progressing toward goal  Pt will Go Up / Down Stairs 3-5 stairs;with modified independence;with rail(s)  PT Goal: Up/Down Stairs - Progress Other (comment)  Pt will Perform Home Exercise Program Independently  PT Goal: Perform Home Exercise Program - Progress Other (comment)    Van Clines, Lena [phone]

## 2011-04-05 NOTE — Progress Notes (Signed)
CARE MANAGEMENT NOTE 04/05/2011  Action/Plan:   Discharge planning. Spoke with patient. Choice offered. Preoperatively setup with [facility], no changes. CPM, rolling walker and 3in1 have been delivered to his home.   Anticipated DC Date:  04/07/2011   Anticipated DC Plan:  HOME W HOME HEALTH SERVICES      DC Planning Services  CM consult      [facility] Choice  HOME HEALTH   Choice offered to / List presented to:  C-1 Patient   DME arranged  NA      DME agency  NA     HH arranged  HH-2 PT      [facility] agency  [facility]   Status of service:  Completed, signed off MDischarge Disposition:  HOME W HOME HEALTH SERVICES

## 2011-04-05 NOTE — Progress Notes (Signed)
Physical Therapy Evaluation Patient Details Name: David Kelly MRN: [id number] DOB: [birth year] Today's Date: 04/05/2011  Problem List: There is no problem list on file for this patient.   Past Medical History:  Past Medical History  Diagnosis Date  . Bronchitis, chronic   . Diabetes mellitus     DX  5 YR AGO  . Arthritis    Past Surgical History: History reviewed. No pertinent past surgical history.  PT Assessment/Plan/Recommendation PT Assessment Clinical Impression Statement: 66 yo male s/p LTKA presents with decr functional mobility and impairments listed below; will benefit from acute PT services to maximize independence and safety with mobility, amb, steps, to enable dc home PT Recommendation/Assessment: Patient will need skilled PT in the acute care venue PT Problem List: Decreased strength;Decreased range of motion;Decreased mobility;Decreased knowledge of use of DME;Pain PT Therapy Diagnosis : Difficulty walking;Acute pain PT Plan PT Frequency: 7X/week PT Treatment/Interventions: DME instruction;Gait training;Stair training;Functional mobility training;Therapeutic activities;Therapeutic exercise;Patient/family education PT Recommendation Recommendations for Other Services: OT consult Follow Up Recommendations: Home health PT;Supervision - Intermittent Equipment Recommended: Tub/shower bench PT Goals  Acute Rehab PT Goals PT Goal Formulation: With patient Time For Goal Achievement: 7 days Pt will go Supine/Side to Sit: with modified independence PT Goal: Supine/Side to Sit - Progress: Goal set today Pt will go Sit to Supine/Side: with modified independence PT Goal: Sit to Supine/Side - Progress: Goal set today Pt will go Sit to Stand: with modified independence PT Goal: Sit to Stand - Progress: Goal set today Pt will go Stand to Sit: with modified independence PT Goal: Stand to Sit - Progress: Goal set today Pt will Ambulate: >150 feet;with modified  independence;with rolling walker PT Goal: Ambulate - Progress: Goal set today Pt will Go Up / Down Stairs: 3-5 stairs;with modified independence;with rail(s) PT Goal: Up/Down Stairs - Progress: Goal set today Pt will Perform Home Exercise Program: Independently PT Goal: Perform Home Exercise Program - Progress: Goal set today  PT Evaluation Precautions/Restrictions  Precautions Precautions: Knee Required Braces or Orthoses: No Restrictions Weight Bearing Restrictions: Yes LLE Weight Bearing: Weight bearing as tolerated Prior Functioning  Home Living Lives With: Alone Receives Help From: Friend(s) Type of Home: Mobile home Home Layout: One level Home Access: Stairs to enter Entrance Stairs-Rails: Right;Left;Can reach both Entrance Stairs-Number of Steps: 4 Bathroom Shower/Tub: Forensic scientist: Standard Bathroom Accessibility: Yes How Accessible: Accessible via walker Home Adaptive Equipment: Walker - rolling;Bedside commode/3-in-1 Prior Function Level of Independence: Independent with basic ADLs;Independent with gait;Independent with transfers Able to Take Stairs?: Yes Driving: Yes Vocation: Retired Producer, television/film/video: Awake/alert Overall Cognitive Status: Appears within functional limits for tasks assessed Orientation Level: Oriented X4 Sensation/Coordination Sensation Light Touch: Appears Intact Coordination Gross Motor Movements are Fluid and Coordinated: Yes Fine Motor Movements are Fluid and Coordinated: Yes Extremity Assessment RUE Assessment RUE Assessment: Within Functional Limits LUE Assessment LUE Assessment: Within Functional Limits RLE Assessment RLE Assessment: Within Functional Limits LLE Assessment LLE Assessment: Exceptions to WFL LLE AROM (degrees) LLE Overall AROM Comments: Grossly decr AROM limited by pain; visual estimate 0-70 deg LLE Strength LLE Overall Strength Comments: grossly decr strength,  limited by pain postop; positive quad activation Mobility (including Balance) Bed Mobility Bed Mobility: Yes Supine to Sit: 3: Mod assist;HOB flat;With rails Supine to Sit Details (indicate cue type and reason): cues for safety and technique; physical assist for LLE Transfers Transfers: Yes Sit to Stand: 4: Min assist;With upper extremity assist;From bed Sit to Stand Details (indicate cue type  and reason): Cues fro safety and hand placement Stand to Sit: 4: Min assist;With armrests;To chair/3-in-1 Stand to Sit Details: cues for safety, control, and to pre-position LLE for comfort  Ambulation/Gait Ambulation/Gait: Yes Ambulation/Gait Assistance: 3: Mod assist;4: Min assist (mod assist progressing to min assist) Ambulation/Gait Assistance Details (indicate cue type and reason): initially requiring mod assist for L knee block secondary to buckling; cues for quad activation L for stance stability, which imporved with practice; second person pushing chair behind for safety Ambulation Distance (Feet): 65 Feet Assistive device: Rolling walker Gait Pattern: Step-through pattern (emerging)    Exercise  Total Joint Exercises Ankle Circles/Pumps: AROM;Both;10 reps;Supine Quad Sets: AROM;Left;10 reps;Supine Heel Slides: AAROM;Left;10 reps;Supine End of Session PT - End of Session Equipment Utilized During Treatment: Gait belt Activity Tolerance: Patient tolerated treatment well Patient left: in chair;with call bell in reach;with family/visitor present General Behavior During Session: [facility] for tasks performed Cognition: [facility] for tasks performed  Van Clines [facility] [place], [place] [phone]  04/05/2011, 11:38 AM

## 2011-04-05 NOTE — Progress Notes (Signed)
Clinical Social Work-CSW received referral and reviewed chart with CM/PN-pt will d/c home with home health-No CSW needs-David Kelly-MSW, [phone]

## 2011-04-05 NOTE — Progress Notes (Signed)
PATIENT ID: David Kelly        MRN:  [id number]          DOB/AGE: [birth year] / 66 y.o.     PROGRESS NOTE  Subjective:  negative for Chest Pain  negative for Shortness of Breath  negative for Nausea/Vomiting   negative for Calf Pain  negative for Bowel Movement   Tolerating Diet: yes         Patient reports pain as 3 on 0-10 scale.    Objective: Vital signs in last 24 hours:   Patient Vitals for the past 24 hrs:  BP Temp Temp src Pulse Resp SpO2 Height Weight  04/05/11 0641 131/85 mmHg 97.7 F (36.5 C) - 80  18  100 % - -  04/05/11 0240 132/83 mmHg 98.2 F (36.8 C) - 89  19  100 % - -  04/04/11 2240 152/92 mmHg 98.2 F (36.8 C) Oral 101  20  98 % 6' (1.829 m) 94.666 kg (208 lb 11.2 oz)  04/04/11 1515 133/84 mmHg 97.8 F (36.6 C) - 79  18  100 % - -  04/04/11 1100 118/74 mmHg 97.9 F (36.6 C) - 78  20  98 % - -  04/04/11 1030 - - - 81  11  97 % - -  04/04/11 1020 110/66 mmHg - - - - - - -  04/04/11 1015 - - - 80  13  99 % - -  04/04/11 1005 113/67 mmHg - - - - - - -  04/04/11 1000 - - - 81  13  98 % - -  04/04/11 0950 124/69 mmHg - - - - - - -  04/04/11 0945 - - - 89  15  98 % - -  04/04/11 0935 145/95 mmHg 98 F (36.7 C) - 106  - 98 % - -    @flow {1959:LAST@   Intake/Output from previous day:   02/18 0701 - 02/19 0700 In: 2940 [P.O.:240; I.V.:2700] Out: 1450 [Urine:800; Drains:500]   Intake/Output this shift:   02/19 0701 - 02/19 1900 In: -  Out: 75 [Drains:75]   Intake/Output      02/18 0701 - 02/19 0700 02/19 0701 - 02/20 0700   P.O. 240    I.V. (mL/kg) 2700 (28.5)    Total Intake(mL/kg) 2940 (31.1)    Urine (mL/kg/hr) 800 (0.4)    Drains 500 75   Blood 150    Total Output 1450 75   Net +1490 -75           LABORATORY DATA:  Basename 04/05/11 0505  WBC 8.3  HGB 10.0*  HCT 30.8*  PLT 231    Basename 04/05/11 0505  NA 137  K 4.0  CL 104  CO2 25  BUN 8  CREATININE 0.60  GLUCOSE 144*  CALCIUM 8.9   Lab Results  Component Value Date   INR 1.06 03/22/2011    Examination:  General appearance: alert, cooperative and no distress Extremities: extremities normal, atraumatic, no cyanosis or edema and Homans sign is negative, no sign of DVT  Wound Exam: clean, dry, intact   Drainage:  None: wound tissue dry  Motor Exam: EHL and FHL Intact  Sensory Exam: Deep Peroneal normal  Vascular Exam:    Assessment:    1 Day Post-Op  Procedure(s) (LRB): TOTAL KNEE ARTHROPLASTY (Left)  ADDITIONAL DIAGNOSIS:  Active Problems:  * No active hospital problems. *   Acute Blood Loss Anemia   Plan: Physical Therapy as ordered  Weight Bearing as Tolerated (WBAT)  DVT Prophylaxis:  Lovenox  DISCHARGE PLAN: Home  DISCHARGE NEEDS: HHPT, CPM, Walker and 3-in-1 comode seat         David Kelly 04/05/2011, 8:29 AM

## 2011-04-06 LAB — GLUCOSE, CAPILLARY
Glucose-Capillary: 120 mg/dL — ABNORMAL HIGH (ref 70–99)
Glucose-Capillary: 137 mg/dL — ABNORMAL HIGH (ref 70–99)

## 2011-04-06 LAB — BASIC METABOLIC PANEL
CO2: 24 mEq/L (ref 19–32)
Calcium: 8.8 mg/dL (ref 8.4–10.5)
Chloride: 104 mEq/L (ref 96–112)
Potassium: 3.8 mEq/L (ref 3.5–5.1)
Sodium: 137 mEq/L (ref 135–145)

## 2011-04-06 LAB — CBC
Hemoglobin: 8.8 g/dL — ABNORMAL LOW (ref 13.0–17.0)
Platelets: 199 10*3/uL (ref 150–400)
RBC: 3.27 MIL/uL — ABNORMAL LOW (ref 4.22–5.81)
WBC: 7.2 10*3/uL (ref 4.0–10.5)

## 2011-04-06 MED ORDER — CELECOXIB 200 MG PO CAPS: 200.0000 mg | ORAL_CAPSULE | Freq: Two times a day (BID) | ORAL | Status: AC

## 2011-04-06 MED ORDER — OXYCODONE HCL 10 MG PO TB12: 10.0000 mg | ORAL_TABLET | Freq: Two times a day (BID) | ORAL | Status: AC

## 2011-04-06 MED ORDER — OXYCODONE HCL 5 MG PO TABS: 5.0000 mg | ORAL_TABLET | ORAL | Status: AC | PRN

## 2011-04-06 MED ORDER — ENOXAPARIN SODIUM 40 MG/0.4ML ~~LOC~~ SOLN
40.0000 mg | SUBCUTANEOUS | Status: DC
Start: 2011-04-06 — End: 2015-07-15

## 2011-04-06 MED ORDER — METHOCARBAMOL 500 MG PO TABS: 500.0000 mg | ORAL_TABLET | Freq: Four times a day (QID) | ORAL | Status: AC | PRN

## 2011-04-06 NOTE — Discharge Instructions (Signed)
Home Health PT to be provided thru [facility] [phone]  Diet: As you were doing prior to hospitalization   Activity:  Increase activity slowly as tolerated                  No lifting or driving for 6 weeks  Shower:  May shower without a dressing once there is no drainage from your wound.                 Do NOT wash over the wound.  If drainage remains, cover wound with saran                  Wrap and then shower.  Clean incision with betadine and change dressing                        After saran wrap removed.  Dressing:  You may change your dressing on Thursday                    Then change the dressing daily with sterile 4"x4"s gauze dressing                     And TED hose for knees.  Use paper tape to hold dressing in place                     For hips.  You may clean the incision with alcohol prior to redressing.  Weight Bearing:  Weight bearing as tolerated as taught in physical therapy.  Use a                                walker or Crutches as instructed.  To prevent constipation: you may use a stool softener such as -               Colace ( over the counter) 100 mg by mouth twice a day                Drink plenty of fluids ( prune juice may be helpful) and high fiber foods                Miralax ( over the counter) for constipation as needed.    Precautions:  If you experience chest pain or shortness of breath - call 911 immediately               For transfer to the hospital emergency department!!               If you develop a fever greater that 101 F, purulent drainage from wound,                             increased redness or drainage from wound, or calf pain -- Call the office at                                                 [phone].  Follow- Up Appointment:  Please call for an appointment to be seen on 04/19/11  [place] - [phone]                   

## 2011-04-06 NOTE — Progress Notes (Signed)
  David Spurling, MD   Altamese Cabal, PA-C [address] [place], [place], [place]  [address]                             [phone]   PROGRESS NOTE  Subjective:  negative for Chest Pain  negative for Shortness of Breath  negative for Nausea/Vomiting   negative for Calf Pain  negative for Bowel Movement   Tolerating Diet: yes         Patient reports pain as 3 on 0-10 scale.    Objective: Vital signs in last 24 hours:   Patient Vitals for the past 24 hrs:  BP Temp Pulse Resp SpO2  04/06/11 0623 111/68 mmHg 98.5 F (36.9 C) 82  19  92 %  04/05/11 2229 125/74 mmHg 97.8 F (36.6 C) 102  18  92 %  04/05/11 1330 124/66 mmHg 99.3 F (37.4 C) 93  18  96 %    @flow {1959:LAST@   Intake/Output from previous day:   02/19 0701 - 02/20 0700 In: 960 [P.O.:960] Out: 2175 [Urine:1800; Drains:375]   Intake/Output this shift:   02/19 1901 - 02/20 0700 In: -  Out: 1200 [Urine:900; Drains:300]   Intake/Output      02/19 0701 - 02/20 0700   P.O. 960   Total Intake(mL/kg) 960 (10.1)   Urine (mL/kg/hr) 1800 (0.8)   Drains 375   Total Output 2175   Net -1215          LABORATORY DATA:  Basename 04/06/11 0510 04/05/11 0505  WBC 7.2 8.3  HGB 8.8* 10.0*  HCT 27.3* 30.8*  PLT 199 231    Basename 04/05/11 0505  NA 137  K 4.0  CL 104  CO2 25  BUN 8  CREATININE 0.60  GLUCOSE 144*  CALCIUM 8.9   Lab Results  Component Value Date   INR 1.06 03/22/2011    Examination:  General appearance: alert, cooperative and no distress Extremities: Homans sign is negative, no sign of DVT  Wound Exam: clean, dry, intact   Drainage:  None: wound tissue dry  Motor Exam: EHL and FHL Intact  Sensory Exam: Deep Peroneal normal  Vascular Exam:    Assessment:    2 Days Post-Op  Procedure(s) (LRB): TOTAL KNEE ARTHROPLASTY (Left)  ADDITIONAL DIAGNOSIS:  Active Problems:  * No active hospital problems. *   Acute Blood Loss Anemia   Plan: Physical Therapy as ordered Weight  Bearing as Tolerated (WBAT)  DVT Prophylaxis:  Lovenox  DISCHARGE PLAN: Home  DISCHARGE NEEDS: HHPT, CPM, Walker and 3-in-1 comode seat         David Kelly 04/06/2011, 6:54 AM

## 2011-04-06 NOTE — Op Note (Signed)
TOTAL KNEE REPLACEMENT OPERATIVE NOTE:  04/04/2011  6:14 AM  PATIENT:  David Kelly  66 y.o. male  PRE-OPERATIVE DIAGNOSIS:  OA Left knee   POST-OPERATIVE DIAGNOSIS:  OA Left knee   PROCEDURE:  Procedure(s): TOTAL KNEE ARTHROPLASTY  SURGEON:  Surgeon(s): Raymon Mutton, MD  PHYSICIAN ASSISTANT: Altamese Cabal, [facility]  ANESTHESIA:   general  DRAINS: Hemovac and On-Q Marcaine Pain Pump  SPECIMEN: None  COUNTS:  Correct  TOURNIQUET:   Total Tourniquet Time Documented: Thigh (Left) - 57 minutes  DICTATION:  Indication for procedure:    The patient is a 66 y.o. male who has failed conservative treatment for OA Left knee .  Informed consent was obtained prior to anesthesia. The risks versus benefits of the operation were explain and in a way the patient can, and did, understand.   Description of procedure:     The patient was taken to the operating room and placed under anesthesia.  The patient was positioned in the usual fashion taking care that all body parts were adequately padded and/or protected.  I foley catheter was not placed.  A tourniquet was applied and the leg prepped and draped in the usual sterile fashion.  The extremity was exsanguinated with the esmarch and tourniquet inflated to 350 mmHg.  Pre-operative range of motion was normal.  The knee was in 5 degree of significant varus.  A midline incision approximately 6-7 inches long was made with a #10 blade.  A new blade was used to make a parapatellar arthrotomy going 2-3 cm into the quadriceps tendon, over the patella, and alongside the medial aspect of the patellar tendon.  A synovectomy was then performed with the #10 blade and forceps. I then elevated the deep MCL off the medial tibial metaphysis subperiosteally around to the semimembranosus attachment.    I everted the patella and used calipers to measure patellar thickness.  I used the reamer to ream down to appropriate thickness to recreate the native  thickness.  I then removed excess bone with the rongeur and sagittal saw.  I used the appropriately sized template and drilled the three lug holes.  I then put the trial in place and measured the thickness with the calipers to ensure recreation of the native thickness.  The trial was then removed and the patella subluxed and the knee brought into flexion.  A homan retractor was place to retract and protect the patella and lateral structures.  A Z-retractor was place medially to protect the medial structures.  The extra-medullary alignment system was used to make cut the tibial articular surface perpendicular to the anamotic axis of the tibia and in 3 degrees of posterior slope.  The cut surface and alignment jig was removed.  I then used the intramedullary alignment guide to make a 6 valgus cut on the distal femur.  I then marked out the epicondylar axis on the distal femur.  The posterior condylar axis measured 3 degrees.  I then used the anterior referencing sizer and measured the femur to be a size E.  The 4-In-1 cutting block was screwed into place in external rotation matching the posterior condylar angle, making our cuts perpendicular to the epicondylar axis.  Anterior, posterior and chamfer cuts were made with the sagittal saw.  The cutting block and cut pieces were removed.  A lamina spreader was placed in 90 degrees of flexion.  The ACL, PCL, menisci, and posterior condylar osteophytes were removed.  A 12 mm spacer blocked was found  to offer good flexion and extension gap balance after moderate in degree releasing.   The scoop retractor was then placed and the femoral finishing block was pinned in place.  The small sagittal saw was used as well as the lug drill to finish the femur.  The block and cut surfaces were removed and the medullary canal hole filled with autograft bone from the cut pieces.  The tibia was delivered forward in deep flexion and external rotation.  A size 7 tray was selected and  pinned into place centered on the medial 1/3 of the tibial tubercle.  The reamer and keel was used to prepare the tibia through the tray.    I then trialed with the size E femur, size 7 tibia, a 12 mm insert and the 35 patella.  I had excellent flexion/extension gap balance, excellent patella tracking.  Flexion was full and beyond 120 degrees; extension was zero.  These components were chosen and the staff opened them to me on the back table while the knee was lavaged copiously and the cement mixed.  I cemented in the components and removed all excess cement.  The polyethylene tibial component was snapped into place and the knee placed in extension while cement was hardening.  The capsule was infilltrated with 20cc of .25% Marcaine with epinephrine.  A hemovac was place in the joint exiting superolaterally.  A pain pump was place superomedially superficial to the arthrotomy.  Once the cement was hard, the tourniquet was let down.  Hemostasis was obtained.  The arthrotomy was closed with figure-8 #1 vicryl sutures.  The deep soft tissues were closed with #0 vicryls and the subcuticular layer closed with a running #2-0 vicryl.  The skin was reapproximated and closed with skin staples.  The wound was dressed with xeroform, 4 x4's, 2 ABD sponges, a single layer of webril and a TED stocking.   The patient was then awakened, extubated, and taken to the recovery room in stable condition.  BLOOD LOSS:  300cc DRAINS: 1 hemovac, 1 pain catheter COMPLICATIONS:  None.  PLAN OF CARE: Admit to inpatient   PATIENT DISPOSITION:  PACU - hemodynamically stable.   Delay start of Pharmacological VTE agent (>24hrs) due to surgical blood loss or risk of bleeding:  not applicable  Please fax a copy of this op note to my office at [phone] (please only include page 1 and 2 of the Case Information op note)

## 2011-04-06 NOTE — Discharge Summary (Signed)
PATIENT ID:      [name]  MRN:     [id number] DOB/AGE:    1946-01-03 / 66 y.o.     DISCHARGE SUMMARY  ADMISSION DATE:    04/04/2011 DISCHARGE DATE:   04/06/2011   ADMISSION DIAGNOSIS: OA Left knee   (OA Left knee )  DISCHARGE DIAGNOSIS:  OA Left knee     ADDITIONAL DIAGNOSIS: Active Problems:  * No active hospital problems. *   Past Medical History  Diagnosis Date  . Bronchitis, chronic   . Diabetes mellitus     DX  5 YR AGO  . Arthritis     PROCEDURE: Procedure(s): TOTAL KNEE ARTHROPLASTY on 04/04/2011  CONSULTS:     HISTORY:  See H&P in chart  HOSPITAL COURSE:  David Kelly is a 66 y.o. admitted on 04/04/2011 and found to have a diagnosis of OA Left knee .  After appropriate laboratory studies were obtained  they were taken to the operating room on 04/04/2011 and underwent Procedure(s): TOTAL KNEE ARTHROPLASTY.   They were given perioperative antibiotics:  Anti-infectives     Start     Dose/Rate Route Frequency Ordered Stop   04/04/11 1500   ceFAZolin (ANCEF) IVPB 2 g/50 mL premix        2 g 100 mL/hr over 30 Minutes Intravenous Every 6 hours 04/04/11 1103 2011/04/22 0248   04/03/11 1045   ceFAZolin (ANCEF) IVPB 2 g/50 mL premix        2 g 100 mL/hr over 30 Minutes Intravenous 60 min pre-op 04/03/11 1041 04/04/11 0740        . Blood products given:none   The remainder of the hospital course was dedicated to ambulation and strengthening.   The patient was discharged on 2 Days Post-Op in  Good condition.   DIAGNOSTIC STUDIES: Recent vital signs: Patient Vitals for the past 24 hrs:  BP Temp Pulse Resp SpO2  04/06/11 0623 111/68 mmHg 98.5 F (36.9 C) 82  19  92 %  2011/04/22 2229 125/74 mmHg 97.8 F (36.6 C) 102  18  92 %  2011/04/22 1330 124/66 mmHg 99.3 F (37.4 C) 93  18  96 %       Recent laboratory studies:  Basename 04/06/11 0510 04/22/2011 0505  WBC 7.2 8.3  HGB 8.8* 10.0*  HCT 27.3* 30.8*  PLT 199 231    Basename Apr 22, 2011 0505  NA 137    K 4.0  CL 104  CO2 25  BUN 8  CREATININE 0.60  GLUCOSE 144*  CALCIUM 8.9   Lab Results  Component Value Date   INR 1.06 03/22/2011     Recent Radiographic Studies :  No results found.  DISCHARGE INSTRUCTIONS: Discharge Orders    Future Orders Please Complete By Expires   Diet - low sodium heart healthy      Call MD / Call 911      Comments:   If you experience chest pain or shortness of breath, CALL 911 and be transported to the hospital emergency room.  If you develope a fever above 101 F, pus (white drainage) or increased drainage or redness at the wound, or calf pain, call your surgeon's office.   Constipation Prevention      Comments:   Drink plenty of fluids.  Prune juice may be helpful.  You may use a stool softener, such as Colace (over the counter) 100 mg twice a day.  Use MiraLax (over the counter) for constipation as needed.  Increase activity slowly as tolerated      Weight Bearing as taught in Physical Therapy      Comments:   Use a walker or crutches as instructed.   Do not put a pillow under the knee. Place it under the heel.      Change dressing      Comments:   Change dressing on thursday, then change the dressing daily with sterile 4 x 4 inch gauze dressing and apply TED hose.  You may clean the incision with alcohol prior to redressing.   TED hose      Comments:   Use stockings (TED hose) for 3 weeks on both leg(s).  You may remove them at night for sleeping.   CPM      Comments:   Continuous passive motion machine (CPM):      Use the CPM from 0 to 90 for 6-8 hours per day.      You may increase by 10 per day.  You may break it up into 2 or 3 sessions per day.      Use CPM for 2 weeks or until you are told to stop.   Lifting restrictions      Comments:   No lifting for 6 weeks   Driving restrictions      Comments:   No driving for 6 weeks      DISCHARGE MEDICATIONS:   Medication List  As of 04/06/2011  7:01 AM   STOP taking these medications          aspirin 81 MG chewable tablet      fish oil-omega-3 fatty acids 1000 MG capsule         TAKE these medications         acetaminophen 500 MG tablet   Commonly known as: TYLENOL   Take 500 mg by mouth every 6 (six) hours as needed. For pain/fever      celecoxib 200 MG capsule   Commonly known as: CELEBREX   Take 1 capsule (200 mg total) by mouth 2 (two) times daily with a meal.      enoxaparin 40 MG/0.4ML Soln   Commonly known as: LOVENOX   Inject 0.4 mLs (40 mg total) into the skin daily.      lovastatin 40 MG tablet   Commonly known as: MEVACOR   Take 40 mg by mouth at bedtime.      metFORMIN 850 MG tablet   Commonly known as: GLUCOPHAGE   Take 850 mg by mouth 2 (two) times daily with a meal.      methocarbamol 500 MG tablet   Commonly known as: ROBAXIN   Take 1-2 tablets (500-1,000 mg total) by mouth every 6 (six) hours as needed.      metoprolol tartrate 25 MG tablet   Commonly known as: LOPRESSOR   Take 25 mg by mouth 2 (two) times daily.      oxyCODONE 5 MG immediate release tablet   Commonly known as: Oxy IR/ROXICODONE   Take 1-2 tablets (5-10 mg total) by mouth every 3 (three) hours as needed.      oxyCODONE 10 MG 12 hr tablet   Commonly known as: OXYCONTIN   Take 1 tablet (10 mg total) by mouth every 12 (twelve) hours.            FOLLOW UP VISIT:   Follow-up Information    Follow up with Raymon Mutton, MD. Call on 04/19/2011.   Contact information:   [address] [place] [address] [phone]          DISPOSITION:  Home  Final discharge disposition not confirmed  CONDITION:  Good   [name] 04/06/2011, 7:01 AM

## 2011-04-06 NOTE — Progress Notes (Signed)
UR COMPLETED  

## 2011-04-06 NOTE — Progress Notes (Signed)
CARE MANAGEMENT NOTE 04/06/2011  [facility] can not service this patient. Contacted [facility]- the dont accept [place], contacted [facility], they will be able to accept patient with start of care being 04/07/11. Informed patient.

## 2011-04-06 NOTE — Progress Notes (Signed)
Physical Therapy Treatment Patient Details Name: David Kelly MRN: [id number] DOB: 12-29-1945 Today's Date: 04/06/2011  PT Assessment/Plan  PT - Assessment/Plan Comments on Treatment Session: Pt admitted s/p left TKA and is progressing great.  Pt able to increase ambulation distance/independence as well as negotiate stairs.  Pt ready for d/c home once medically cleared by MD. PT Plan: Discharge plan remains appropriate;Frequency remains appropriate PT Frequency: 7X/week Follow Up Recommendations: Home health PT;Supervision - Intermittent Equipment Recommended: Tub/shower bench PT Goals  Acute Rehab PT Goals PT Goal Formulation: With patient Time For Goal Achievement: 7 days PT Goal: Supine/Side to Sit - Progress: Progressing toward goal PT Goal: Sit to Stand - Progress: Progressing toward goal PT Goal: Stand to Sit - Progress: Progressing toward goal PT Goal: Ambulate - Progress: Progressing toward goal PT Goal: Up/Down Stairs - Progress: Progressing toward goal  PT Treatment Precautions/Restrictions  Precautions Precautions: Knee Precaution Booklet Issued: No Required Braces or Orthoses: No Restrictions Weight Bearing Restrictions: Yes LLE Weight Bearing: Weight bearing as tolerated Mobility (including Balance) Bed Mobility Bed Mobility: Yes Supine to Sit: 4: Min assist;HOB flat Supine to Sit Details (indicate cue type and reason): Assist for left LE due to decreased strength.  Cues for sequence. Sit to Supine: Not Tested (comment) Transfers Transfers: Yes Sit to Stand: 5: Supervision;With upper extremity assist;From bed Sit to Stand Details (indicate cue type and reason): Verbal cues for hand placement and safety. Stand to Sit: 4: Min assist;With upper extremity assist;To chair/3-in-1 (Min (guard)) Stand to Sit Details: Guarding for balance with cues for hand/left LE placement. Ambulation/Gait Ambulation/Gait: Yes Ambulation/Gait Assistance: 5:  Supervision Ambulation/Gait Assistance Details (indicate cue type and reason): Verbal cues for tall posture and safety inside RW. Ambulation Distance (Feet): 150 Feet Assistive device: Rolling walker Gait Pattern: Step-through pattern;Decreased step length - left;Decreased stance time - left;Trunk flexed;Left flexed knee in stance Stairs: Yes Stairs Assistance: 4: Min assist (Min (guard)) Stairs Assistance Details (indicate cue type and reason): Guarding for balance with verbal cues for safe sequence using "up with good, down with bad." Stair Management Technique: Two rails;Step to pattern;Forwards Number of Stairs: 4  Height of Stairs: 8  (inches.) Wheelchair Mobility Wheelchair Mobility: No  Posture/Postural Control Posture/Postural Control: No significant limitations Balance Balance Assessed: No End of Session PT - End of Session Equipment Utilized During Treatment: Gait belt Activity Tolerance: Patient tolerated treatment well Patient left: in chair;with call bell in reach;with family/visitor present Nurse Communication: Mobility status for transfers;Mobility status for ambulation General Behavior During Session: [facility] for tasks performed Cognition: [facility] for tasks performed  Cephus Shelling 04/06/2011, 10:17 AM  04/06/2011 Cephus Shelling, PT, DPT [phone]

## 2011-04-06 NOTE — Progress Notes (Signed)
Occupational Therapy Treatment Patient Details Name: David Kelly MRN: [id number] DOB: [birth year] Today's Date: 04/06/2011  OT Assessment/Plan OT Assessment/Plan Comments on Treatment Session: Pt. moving well and anticipates D/C today OT Plan: Discharge plan remains appropriate OT Frequency: Min 2X/week Follow Up Recommendations: Home health OT;Supervision - Intermittent Equipment Recommended: Tub/shower bench OT Goals Acute Rehab OT Goals OT Goal Formulation: With patient Time For Goal Achievement: 7 days ADL Goals Pt Will Perform Grooming: with set-up;with supervision;Standing at sink ADL Goal: Grooming - Progress: Progressing toward goals Pt Will Perform Lower Body Bathing: with set-up;with supervision;Sit to stand from chair;with adaptive equipment ADL Goal: Lower Body Bathing - Progress: Partly met Pt Will Perform Lower Body Dressing: with set-up;with supervision;Sit to stand from chair;with adaptive equipment ADL Goal: Lower Body Dressing - Progress: Partly met Pt Will Perform Tub/Shower Transfer: Tub transfer;with supervision;with DME;Transfer tub bench ADL Goal: Tub/Shower Transfer - Progress: Partly met  OT Treatment Precautions/Restrictions  Precautions Precautions: Knee Precaution Booklet Issued: No Required Braces or Orthoses: No Restrictions Weight Bearing Restrictions: Yes LLE Weight Bearing: Weight bearing as tolerated   ADL ADL Tub/Shower Transfer: Performed;Minimal assistance Tub/Shower Transfer Details (indicate cue type and reason): Assist for Lt. LE over wall of tub and mod verbal cues for transfer technique and safe hand placement Tub/Shower Transfer Method: Ambulating Tub/Shower Transfer Equipment: Counsellor Used: Rolling walker Ambulation Related to ADLs: Pt. supervision ~8' with RW ADL Comments: Educated pt. on LB ADL techniques with use of AE to complete. Educated and demonstrated for pt. technique for tub transfer with use of  bench.  Mobility  Bed Mobility Bed Mobility: No Supine to Sit: 4: Min assist;HOB flat Supine to Sit Details (indicate cue type and reason): Assist for left LE due to decreased strength.  Cues for sequence. Sit to Supine: Not Tested (comment) Transfers Sit to Stand: 5: Supervision;With upper extremity assist;From bed Sit to Stand Details (indicate cue type and reason): Mod verbal cues for hand placement and safety Stand to Sit: 4: Min assist;With upper extremity assist;To chair/3-in-1 Stand to Sit Details: Guarding for balance with cues for hand/left LE placement.     End of Session OT - End of Session Equipment Utilized During Treatment: Gait belt Activity Tolerance: Patient tolerated treatment well Patient left: in chair;with call bell in reach;with family/visitor present Nurse Communication: Mobility status for transfers General Behavior During Session: [facility] for tasks performed Cognition: [facility] for tasks performed  Clancy Leiner, OTR/L Pager [phone]  04/06/2011, 12:00 PM

## 2011-04-12 ENCOUNTER — Encounter (HOSPITAL_COMMUNITY): Payer: Self-pay | Admitting: Orthopedic Surgery

## 2014-06-23 ENCOUNTER — Encounter: Payer: Self-pay | Admitting: Gastroenterology

## 2015-04-29 DIAGNOSIS — J309 Allergic rhinitis, unspecified: Secondary | ICD-10-CM | POA: Diagnosis not present

## 2015-04-29 DIAGNOSIS — N400 Enlarged prostate without lower urinary tract symptoms: Secondary | ICD-10-CM | POA: Diagnosis not present

## 2015-04-29 DIAGNOSIS — G8929 Other chronic pain: Secondary | ICD-10-CM | POA: Diagnosis not present

## 2015-05-21 DIAGNOSIS — M25562 Pain in left knee: Secondary | ICD-10-CM | POA: Diagnosis not present

## 2015-05-21 DIAGNOSIS — M1712 Unilateral primary osteoarthritis, left knee: Secondary | ICD-10-CM | POA: Diagnosis not present

## 2015-05-28 DIAGNOSIS — Z01818 Encounter for other preprocedural examination: Secondary | ICD-10-CM | POA: Diagnosis not present

## 2015-05-28 DIAGNOSIS — G8929 Other chronic pain: Secondary | ICD-10-CM | POA: Diagnosis not present

## 2015-05-28 DIAGNOSIS — Z79899 Other long term (current) drug therapy: Secondary | ICD-10-CM | POA: Diagnosis not present

## 2015-05-28 DIAGNOSIS — J309 Allergic rhinitis, unspecified: Secondary | ICD-10-CM | POA: Diagnosis not present

## 2015-06-09 DIAGNOSIS — Z01810 Encounter for preprocedural cardiovascular examination: Secondary | ICD-10-CM | POA: Diagnosis not present

## 2015-06-09 DIAGNOSIS — Z01818 Encounter for other preprocedural examination: Secondary | ICD-10-CM | POA: Diagnosis not present

## 2015-07-02 DIAGNOSIS — R1310 Dysphagia, unspecified: Secondary | ICD-10-CM | POA: Diagnosis not present

## 2015-07-15 ENCOUNTER — Other Ambulatory Visit (INDEPENDENT_AMBULATORY_CARE_PROVIDER_SITE_OTHER): Payer: Medicare Other

## 2015-07-15 ENCOUNTER — Encounter: Payer: Self-pay | Admitting: Family

## 2015-07-15 ENCOUNTER — Telehealth: Payer: Self-pay | Admitting: Family

## 2015-07-15 ENCOUNTER — Ambulatory Visit (INDEPENDENT_AMBULATORY_CARE_PROVIDER_SITE_OTHER): Payer: Medicare Other | Admitting: Family

## 2015-07-15 VITALS — BP 118/70 | HR 69 | Temp 97.9°F | Resp 16 | Ht 72.0 in | Wt 203.0 lb

## 2015-07-15 DIAGNOSIS — M150 Primary generalized (osteo)arthritis: Secondary | ICD-10-CM | POA: Diagnosis not present

## 2015-07-15 DIAGNOSIS — E119 Type 2 diabetes mellitus without complications: Secondary | ICD-10-CM

## 2015-07-15 DIAGNOSIS — M159 Polyosteoarthritis, unspecified: Secondary | ICD-10-CM

## 2015-07-15 DIAGNOSIS — M1990 Unspecified osteoarthritis, unspecified site: Secondary | ICD-10-CM | POA: Insufficient documentation

## 2015-07-15 DIAGNOSIS — Z79899 Other long term (current) drug therapy: Secondary | ICD-10-CM | POA: Diagnosis not present

## 2015-07-15 LAB — HEMOGLOBIN A1C: Hgb A1c MFr Bld: 6.8 % — ABNORMAL HIGH (ref 4.6–6.5)

## 2015-07-15 LAB — BASIC METABOLIC PANEL
BUN: 13 mg/dL (ref 6–23)
CO2: 26 mEq/L (ref 19–32)
CREATININE: 0.68 mg/dL (ref 0.40–1.50)
Calcium: 9.5 mg/dL (ref 8.4–10.5)
Chloride: 105 mEq/L (ref 96–112)
GFR: 148.12 mL/min (ref >60.00)
GLUCOSE: 105 mg/dL — AB (ref 70–99)
POTASSIUM: 4.3 meq/L (ref 3.5–5.1)
Sodium: 137 mEq/L (ref 135–145)

## 2015-07-15 MED ORDER — HYDROCODONE-ACETAMINOPHEN 7.5-325 MG PO TABS: 1.0000 | ORAL_TABLET | Freq: Two times a day (BID) | ORAL | Status: DC | PRN

## 2015-07-15 MED ORDER — PROMETHAZINE-CODEINE 6.25-10 MG/5ML PO SYRP: 5.0000 mL | ORAL_SOLUTION | Freq: Four times a day (QID) | ORAL | Status: DC | PRN

## 2015-07-15 NOTE — Progress Notes (Addendum)
Subjective:    Patient ID: David Kelly, male    DOB: 1945/09/20, 70 y.o.   MRN: [id number]  Chief Complaint  Patient presents with  . Establish Care    has arthritis and has been having pain in his right knee, states he gets pain in right shoulder and goes down to right hip, leg and knee, would like refill of hydrocodone, needs A1c check, feet check     HPI:  David Kelly is a 70 y.o. male who  has a past medical history of Diabetes mellitus; Arthritis; Glaucoma; Kidney stones; and BPH (benign prostatic hypertrophy). and presents today for an office visit to establish care.  1.) Type 2 diabetes - Currently maintained on metformin. Reports taking the medication as prescribed and denies adverse side effects or hypoglycemic readings. Blood sugars at home have been lower than 130. Denies symptoms of end organ damage. Last eye exam was in Feb 2017.  2.) Osteoarthritis - Previously diagnosed with osteoarthritis located his right knee and arm that has been going on for about a year. Previous modifying factors include cortisone injections with the last being in 2016 which helped minimally with the pain and did not last very long. Currently maintained on hydrocodone-acetaminophen. Reports taking the medications as prescribed and denies adverse side effects or constipation. Severity of the pain without the medication is a level of awful and unbearable and with the medication it is improved. Not currently working with an orthopedic although has worked with Dr. Lorre Nick in the past for his left total knee replacement.  No Known Allergies   Outpatient Prescriptions Prior to Visit  Medication Sig Dispense Refill  . acetaminophen (TYLENOL) 500 MG tablet Take 500 mg by mouth every 6 (six) hours as needed. For pain/fever    . enoxaparin (LOVENOX) 40 MG/0.4ML SOLN Inject 0.4 mLs (40 mg total) into the skin daily. 12 Syringe 0  . lovastatin (MEVACOR) 40 MG tablet Take 40 mg by mouth at bedtime.    .  metFORMIN (GLUCOPHAGE) 850 MG tablet Take 850 mg by mouth 2 (two) times daily with a meal.    . metoprolol tartrate (LOPRESSOR) 25 MG tablet Take 25 mg by mouth 2 (two) times daily.     No facility-administered medications prior to visit.     Past Medical History  Diagnosis Date  . Diabetes mellitus     DX  5 YR AGO  . Arthritis   . Glaucoma   . Kidney stones   . BPH (benign prostatic hypertrophy)      Past Surgical History  Procedure Laterality Date  . Total knee arthroplasty  04/04/2011    Procedure: TOTAL KNEE ARTHROPLASTY;  Surgeon: Rudean Haskell, MD;  Location: [place];  Service: Orthopedics;  Laterality: Left;  . Left total knee replacement       Family History  Problem Relation Age of Onset  . Arthritis Mother   . Diabetes Sister   . Asthma Paternal Grandfather      Social History   Social History  . Marital Status: Single    Spouse Name: N/A  . Number of Children: 4  . Years of Education: 11   Occupational History  . Not on file.   Social History Main Topics  . Smoking status: Former Smoker -- 1.50 packs/day for 45 years    Types: Cigarettes    Quit date: 03/21/2006  . Smokeless tobacco: Current User    Types: Chew  . Alcohol Use: No  .  Drug Use: No  . Sexual Activity: Not on file   Other Topics Concern  . Not on file   Social History Narrative   Fun: Fish and go to the park     Review of Systems  Constitutional: Negative for fever and chills.  Respiratory: Negative for chest tightness and shortness of breath.   Cardiovascular: Negative for chest pain, palpitations and leg swelling.  Endocrine: Negative for polydipsia, polyphagia and polyuria.  Musculoskeletal:       Positive for right knee pain.       Objective:    BP 118/70 mmHg  Pulse 69  Temp(Src) 97.9 F (36.6 C) (Oral)  Resp 16  Ht 6' (1.829 m)  Wt 203 lb (92.08 kg)  BMI 27.53 kg/m2  SpO2 98% Nursing note and vital signs reviewed.  Physical Exam  Constitutional: He is  oriented to person, place, and time. He appears well-developed and well-nourished. No distress.  Cardiovascular: Normal rate, regular rhythm, normal heart sounds and intact distal pulses.   Pulmonary/Chest: Effort normal and breath sounds normal.  Musculoskeletal:  Right knee - no obvious deformity, discoloration, or edema noted. Generalized tenderness elicited on lateral and medial joint lines. Range of motion is within normal limits and strength is normal. Ligamentous and meniscal testing are negative. Distal pulses and sensation are intact and appropriate.  Neurological: He is alert and oriented to person, place, and time.  Diabetic Foot Exam - Simple   Simple Foot Form  Diabetic Foot exam was performed with the following findings:  Yes  07/15/2015  1:01 PM  Visual Inspection  No deformities, no ulcerations, no other skin breakdown bilaterally:  Yes  Sensation Testing  See comments:  Yes  Pulse Check  See comments:  Yes  Comments  Sensation decreased to monofilament throughout but intact tactile  sensation.   Skin: Skin is warm and dry.  Psychiatric: He has a normal mood and affect. His behavior is normal. Judgment and thought content normal.       Assessment & Plan:   Problem List Items Addressed This Visit      Endocrine   Type 2 diabetes mellitus ([place]) - Primary    Type 2 diabetes appears adequately controlled through blood sugar log at home. No adverse side effects of metformin or hypoglycemic readings. Tablet foot exam completed today. Diabetic eye exam up-to-date. Not currently maintained on a statin or ACE/ARB for CAD risk reduction. Will check [facility] records. Continue to monitor blood sugar at home. Obtain A1c and basic metabolic panel. Continue current dosage of metformin pending A1c results.      Relevant Medications   metFORMIN (GLUCOPHAGE) 1000 MG tablet   aspirin 81 MG tablet   Other Relevant Orders   Hemoglobin A1c (Completed)   Basic Metabolic Panel (BMET)  (Completed)     Musculoskeletal and Integument   Osteoarthritis    Symptoms and exam consistent with osteoarthritis with adequate range of motion and functionality. Discussed use of opioids/narcotics and decreasing to minimal as tolerated for functionality. Refill hydrocodone-acetaminophen decreasing the dose to twice daily as needed. Recommend ice and Tylenol as needed for discomfort. Follow-up with orthopedics if symptoms continue to occur for possible total knee arthroplasty.      Relevant Medications   aspirin 81 MG tablet   HYDROcodone-acetaminophen (NORCO) 7.5-325 MG tablet       I have discontinued Mr. Deschepper lovastatin, metoprolol tartrate, acetaminophen, and enoxaparin. I have also changed his promethazine-codeine and HYDROcodone-acetaminophen. Additionally, I am having him maintain  his tamsulosin, metFORMIN, montelukast, and aspirin.   Meds ordered this encounter  Medications  . tamsulosin (FLOMAX) 0.4 MG CAPS capsule    Sig: Take 0.4 mg by mouth.  . metFORMIN (GLUCOPHAGE) 1000 MG tablet    Sig: Take 1,000 mg by mouth 2 (two) times daily with a meal.  . montelukast (SINGULAIR) 10 MG tablet    Sig: Take 10 mg by mouth at bedtime.  Marland Kitchen DISCONTD: HYDROcodone-acetaminophen (NORCO) 7.5-325 MG tablet    Sig: Take 1 tablet by mouth every 6 (six) hours as needed for moderate pain.  Marland Kitchen aspirin 81 MG tablet    Sig: Take 81 mg by mouth daily.  Marland Kitchen DISCONTD: promethazine-codeine (PHENERGAN WITH CODEINE) 6.25-10 MG/5ML syrup    Sig: Take by mouth every 6 (six) hours as needed for cough.  . promethazine-codeine (PHENERGAN WITH CODEINE) 6.25-10 MG/5ML syrup    Sig: Take 5 mLs by mouth every 6 (six) hours as needed for cough.    Dispense:  180 mL    Refill:  0    Order Specific Question:  Supervising Provider    Answer:  Pricilla Holm A [id number]  . HYDROcodone-acetaminophen (NORCO) 7.5-325 MG tablet    Sig: Take 1 tablet by mouth 2 (two) times daily as needed for moderate pain.     Dispense:  60 tablet    Refill:  0    Order Specific Question:  Supervising Provider    Answer:  Pricilla Holm A [id number]     Follow-up: Return in about 1 month (around 08/14/2015), or if symptoms worsen or fail to improve.  Mauricio Po, FNP

## 2015-07-15 NOTE — Telephone Encounter (Signed)
Please inform patient that his hemoglobin A1c is 6.8 and therefore please continue the current dosage of metformin. Otherwise his kidney function and electrolytes are within the normal limits. Therefore he can plan to follow up in 3 months for his diabetes.

## 2015-07-15 NOTE — Progress Notes (Signed)
Pre visit review using our clinic review tool, if applicable. No additional management support is needed unless otherwise documented below in the visit note. 

## 2015-07-15 NOTE — Patient Instructions (Signed)
Thank you for choosing [facility].  Summary/Instructions:  Please continue to take your medication as prescribed.   Limit the amount of narcotics/opioid as much as possible. Ice and Tylenol as needed. If you continue to have pain please contact Dr. Jeoffrey Massed office to follow up.  Your prescription(s) have been submitted to your pharmacy or been printed and provided for you. Please take as directed and contact our office if you believe you are having problem(s) with the medication(s) or have any questions.  Please stop by the lab on the basement level of the building for your blood work. Your results will be released to [facility] (or called to you) after review, usually within 72 hours after test completion. If any changes need to be made, you will be notified at that same time.  If your symptoms worsen or fail to improve, please contact our office for further instruction, or in case of emergency go directly to the emergency room at the closest medical facility.

## 2015-07-15 NOTE — Assessment & Plan Note (Signed)
Symptoms and exam consistent with osteoarthritis with adequate range of motion and functionality. Discussed use of opioids/narcotics and decreasing to minimal as tolerated for functionality. Refill hydrocodone-acetaminophen decreasing the dose to twice daily as needed. Recommend ice and Tylenol as needed for discomfort. Follow-up with orthopedics if symptoms continue to occur for possible total knee arthroplasty.

## 2015-07-15 NOTE — Assessment & Plan Note (Signed)
Type 2 diabetes appears adequately controlled through blood sugar log at home. No adverse side effects of metformin or hypoglycemic readings. Tablet foot exam completed today. Diabetic eye exam up-to-date. Not currently maintained on a statin or ACE/ARB for CAD risk reduction. Will check [facility] records. Continue to monitor blood sugar at home. Obtain A1c and basic metabolic panel. Continue current dosage of metformin pending A1c results.

## 2015-07-17 NOTE — Telephone Encounter (Signed)
Pt aware of results. Pt states that you gave him a 30 day supply of pain medication. Did you want him to make a follow up appointment for a refill of that pain medication when it is due or does he just need to call for the refill? Please advise

## 2015-07-17 NOTE — Telephone Encounter (Signed)
He was instructed to follow up in 1 month for pain medication follow up.

## 2015-07-23 DIAGNOSIS — E119 Type 2 diabetes mellitus without complications: Secondary | ICD-10-CM | POA: Diagnosis not present

## 2015-07-23 DIAGNOSIS — K648 Other hemorrhoids: Secondary | ICD-10-CM | POA: Diagnosis not present

## 2015-07-23 DIAGNOSIS — K5730 Diverticulosis of large intestine without perforation or abscess without bleeding: Secondary | ICD-10-CM | POA: Diagnosis not present

## 2015-07-23 DIAGNOSIS — F17200 Nicotine dependence, unspecified, uncomplicated: Secondary | ICD-10-CM | POA: Diagnosis not present

## 2015-07-23 DIAGNOSIS — D124 Benign neoplasm of descending colon: Secondary | ICD-10-CM | POA: Diagnosis not present

## 2015-07-23 DIAGNOSIS — Z86010 Personal history of colonic polyps: Secondary | ICD-10-CM | POA: Diagnosis not present

## 2015-07-23 DIAGNOSIS — Z79899 Other long term (current) drug therapy: Secondary | ICD-10-CM | POA: Diagnosis not present

## 2015-07-23 DIAGNOSIS — Z1211 Encounter for screening for malignant neoplasm of colon: Secondary | ICD-10-CM | POA: Diagnosis not present

## 2015-07-23 DIAGNOSIS — Z7984 Long term (current) use of oral hypoglycemic drugs: Secondary | ICD-10-CM | POA: Diagnosis not present

## 2015-07-23 DIAGNOSIS — K635 Polyp of colon: Secondary | ICD-10-CM | POA: Diagnosis not present

## 2015-07-24 NOTE — Telephone Encounter (Signed)
Set up 1 month follow up for medication refill

## 2015-07-28 ENCOUNTER — Encounter: Payer: Self-pay | Admitting: Family

## 2015-08-13 ENCOUNTER — Encounter: Payer: Self-pay | Admitting: Family

## 2015-08-13 ENCOUNTER — Ambulatory Visit (INDEPENDENT_AMBULATORY_CARE_PROVIDER_SITE_OTHER): Payer: Medicare Other | Admitting: Family

## 2015-08-13 VITALS — BP 130/82 | HR 66 | Temp 97.8°F | Resp 16 | Ht 72.0 in | Wt 205.0 lb

## 2015-08-13 DIAGNOSIS — M150 Primary generalized (osteo)arthritis: Secondary | ICD-10-CM

## 2015-08-13 DIAGNOSIS — L299 Pruritus, unspecified: Secondary | ICD-10-CM | POA: Insufficient documentation

## 2015-08-13 DIAGNOSIS — M159 Polyosteoarthritis, unspecified: Secondary | ICD-10-CM

## 2015-08-13 MED ORDER — HYDROCODONE-ACETAMINOPHEN 7.5-325 MG PO TABS: 1.0000 | ORAL_TABLET | Freq: Three times a day (TID) | ORAL | Status: DC | PRN

## 2015-08-13 MED ORDER — BRIMONIDINE TARTRATE-TIMOLOL 0.2-0.5 % OP SOLN: 1.0000 [drp] | Freq: Two times a day (BID) | OPHTHALMIC | Status: DC

## 2015-08-13 MED ORDER — TRIAMCINOLONE ACETONIDE 0.025 % EX CREA: 1.0000 "application " | TOPICAL_CREAM | Freq: Two times a day (BID) | CUTANEOUS | Status: DC

## 2015-08-13 MED ORDER — HYDROCODONE-ACETAMINOPHEN 7.5-325 MG PO TABS: 1.0000 | ORAL_TABLET | Freq: Two times a day (BID) | ORAL | Status: DC | PRN

## 2015-08-13 NOTE — Patient Instructions (Signed)
Thank you for choosing [facility].  Summary/Instructions:  Please check with pharmacy regarding medication change.  Your prescription(s) have been submitted to your pharmacy or been printed and provided for you. Please take as directed and contact our office if you believe you are having problem(s) with the medication(s) or have any questions.  If your symptoms worsen or fail to improve, please contact our office for further instruction, or in case of emergency go directly to the emergency room at the closest medical facility.

## 2015-08-13 NOTE — Assessment & Plan Note (Signed)
No obvious cause of ear itchiness. Question possible eczematous or dryness. Continue current dosage of triamcinolone. Advised to use minimally to prevent skin thinning. Follow up if symptoms worsen or are no longer controlled.

## 2015-08-13 NOTE — Assessment & Plan Note (Signed)
Appears stable and functional with current regimen. Does have some increased pain compared to previous. Will increase frequency of medication back to 3x daily. Continue ice therapy and home exercise. Follow up in 1 month.

## 2015-08-13 NOTE — Progress Notes (Signed)
Subjective:    Patient ID: David Kelly, male    DOB: 1945/07/01, 70 y.o.   MRN: [id number]  Chief Complaint  Patient presents with  . Medication Refill    Refill of pain medication, triamcinolone cream and eye drops    HPI:  David Kelly is a 70 y.o. male who  has a past medical history of Diabetes mellitus; Arthritis; Glaucoma; Kidney stones; and BPH (benign prostatic hypertrophy). and presents today for a follow up office visit.   1.) Osteoarthritis - Currently maintained on hydrocodone-acetaminophen for the associated symptom of pain located in his right knee. Reports taking the medication as prescribed and denies adverse side effects or constipation. Pain is labile with the current medication and does have adequate functionality.  2.) Ear itching - Associated symptom of ear itchiness and currently well maintained with triamcinalone 0.025%. Denies pain, discharge or changes in hearing.    No Known Allergies   Current Outpatient Prescriptions on File Prior to Visit  Medication Sig Dispense Refill  . aspirin 81 MG tablet Take 81 mg by mouth daily.    . metFORMIN (GLUCOPHAGE) 1000 MG tablet Take 1,000 mg by mouth 2 (two) times daily with a meal.    . montelukast (SINGULAIR) 10 MG tablet Take 10 mg by mouth at bedtime.    . tamsulosin (FLOMAX) 0.4 MG CAPS capsule Take 0.4 mg by mouth.     No current facility-administered medications on file prior to visit.     Review of Systems  Constitutional: Negative for fever and chills.  HENT: Negative for ear discharge and ear pain.   Musculoskeletal: Positive for arthralgias.  Neurological: Negative for weakness and numbness.      Objective:    BP 130/82 mmHg  Pulse 66  Temp(Src) 97.8 F (36.6 C) (Oral)  Resp 16  Ht 6' (1.829 m)  Wt 205 lb (92.987 kg)  BMI 27.80 kg/m2  SpO2 97% Nursing note and vital signs reviewed.  Physical Exam  Constitutional: He is oriented to person, place, and time. He appears  well-developed and well-nourished. No distress.  HENT:  Right Ear: Tympanic membrane, external ear and ear canal normal. No drainage, swelling or tenderness. No foreign bodies. No middle ear effusion. No decreased hearing is noted.  Left Ear: Hearing, tympanic membrane, external ear and ear canal normal. No drainage, swelling or tenderness. No foreign bodies.  No middle ear effusion. No decreased hearing is noted.  Cardiovascular: Normal rate, regular rhythm, normal heart sounds and intact distal pulses.   Pulmonary/Chest: Effort normal and breath sounds normal.  Musculoskeletal:  Right knee - no obvious deformity, discoloration, or edema noted. Generalized tenderness elicited on lateral and medial joint lines. Range of motion is within normal limits and strength is normal. Ligamentous and meniscal testing are negative. Distal pulses and sensation are intact and appropriate.  Neurological: He is alert and oriented to person, place, and time.  Skin: Skin is warm and dry.  Psychiatric: He has a normal mood and affect. His behavior is normal. Judgment and thought content normal.       Assessment & Plan:   Problem List Items Addressed This Visit      Musculoskeletal and Integument   Osteoarthritis - Primary    Appears stable and functional with current regimen. Does have some increased pain compared to previous. Will increase frequency of medication back to 3x daily. Continue ice therapy and home exercise. Follow up in 1 month.       Relevant  Medications   HYDROcodone-acetaminophen (NORCO) 7.5-325 MG tablet     Other   Itching of ear    No obvious cause of ear itchiness. Question possible eczematous or dryness. Continue current dosage of triamcinolone. Advised to use minimally to prevent skin thinning. Follow up if symptoms worsen or are no longer controlled.       Relevant Medications   triamcinolone (KENALOG) 0.025 % cream       I have discontinued Mr. Spielberg promethazine-codeine  and HYDROcodone-acetaminophen. I have also changed his HYDROcodone-acetaminophen. Additionally, I am having him maintain his tamsulosin, metFORMIN, montelukast, aspirin, brimonidine-timolol, and triamcinolone.   Meds ordered this encounter  Medications  . DISCONTD: triamcinolone (KENALOG) 0.025 % cream    Sig: Apply 1 application topically 2 (two) times daily.  Marland Kitchen DISCONTD: brimonidine-timolol (COMBIGAN) 0.2-0.5 % ophthalmic solution    Sig: Place 1 drop into both eyes every 12 (twelve) hours.  . brimonidine-timolol (COMBIGAN) 0.2-0.5 % ophthalmic solution    Sig: Place 1 drop into both eyes every 12 (twelve) hours.    Dispense:  15 mL    Refill:  1    Order Specific Question:  Supervising Provider    Answer:  Pricilla Holm A [id number]  . DISCONTD: HYDROcodone-acetaminophen (NORCO) 7.5-325 MG tablet    Sig: Take 1 tablet by mouth 2 (two) times daily as needed for moderate pain.    Dispense:  60 tablet    Refill:  0    Order Specific Question:  Supervising Provider    Answer:  Pricilla Holm A [id number]  . triamcinolone (KENALOG) 0.025 % cream    Sig: Apply 1 application topically 2 (two) times daily.    Dispense:  30 g    Refill:  1    Order Specific Question:  Supervising Provider    Answer:  Pricilla Holm A [id number]  . HYDROcodone-acetaminophen (NORCO) 7.5-325 MG tablet    Sig: Take 1 tablet by mouth 3 (three) times daily as needed for moderate pain.    Dispense:  90 tablet    Refill:  0    Order Specific Question:  Supervising Provider    Answer:  Pricilla Holm A [id number]     Follow-up: Return in about 1 month (around 09/12/2015).  Mauricio Po, FNP

## 2015-09-23 ENCOUNTER — Ambulatory Visit: Payer: Medicare Other | Admitting: Family

## 2015-09-23 ENCOUNTER — Other Ambulatory Visit (INDEPENDENT_AMBULATORY_CARE_PROVIDER_SITE_OTHER): Payer: Medicare Other

## 2015-09-23 ENCOUNTER — Ambulatory Visit (INDEPENDENT_AMBULATORY_CARE_PROVIDER_SITE_OTHER): Payer: Medicare Other | Admitting: Family

## 2015-09-23 ENCOUNTER — Encounter: Payer: Self-pay | Admitting: Family

## 2015-09-23 VITALS — BP 122/80 | HR 71 | Temp 98.0°F | Resp 16 | Ht 72.0 in | Wt 204.0 lb

## 2015-09-23 DIAGNOSIS — M150 Primary generalized (osteo)arthritis: Secondary | ICD-10-CM | POA: Diagnosis not present

## 2015-09-23 DIAGNOSIS — E119 Type 2 diabetes mellitus without complications: Secondary | ICD-10-CM | POA: Diagnosis not present

## 2015-09-23 DIAGNOSIS — M159 Polyosteoarthritis, unspecified: Secondary | ICD-10-CM

## 2015-09-23 LAB — HEMOGLOBIN A1C: Hgb A1c MFr Bld: 6.7 % — ABNORMAL HIGH (ref 4.6–6.5)

## 2015-09-23 LAB — MICROALBUMIN / CREATININE URINE RATIO
CREATININE, U: 162.3 mg/dL
MICROALB UR: 1.5 mg/dL (ref 0.0–1.9)
Microalb Creat Ratio: 0.9 mg/g (ref 0.0–30.0)

## 2015-09-23 MED ORDER — HYDROCODONE-ACETAMINOPHEN 10-325 MG PO TABS: 1.0000 | ORAL_TABLET | Freq: Three times a day (TID) | ORAL | 0 refills | Status: DC | PRN

## 2015-09-23 NOTE — Assessment & Plan Note (Signed)
Type 2 diabetes appears adequately controlled with current regimen and no adverse side effects. Continue current dosage of metformin. Obtain A1c and microalbumin. Not currently maintained on ACE/ARB or statin. Will schedule time for Pneumovax. Diabetic foot and eye exams are up-to-date.

## 2015-09-23 NOTE — Assessment & Plan Note (Signed)
Continues to experience osteoarthritic related pain in multiple joints that is less controlled with a decrease dosage of hydrocodone-acetaminophen. His functionality has been affected not been able to move as much. Increase hydrocodone-acetaminophen to 10-3 25. No side effects or constipation. Recommend a controlled substance database reviewed with no irregularities. Encouraged to continue nonpharmacological treatments including ice/heat and exercise therapy.

## 2015-09-23 NOTE — Patient Instructions (Signed)
Thank you for choosing [facility].  Summary/Instructions:  Please continue to take her medications as prescribed.  They will call to schedule your podiatry exam for your diabetic foot care.  Your prescription(s) have been submitted to your pharmacy or been printed and provided for you. Please take as directed and contact our office if you believe you are having problem(s) with the medication(s) or have any questions.  Please stop by the lab on the lower level of the building for your blood work. Your results will be released to [facility] (or called to you) after review, usually within 72 hours after test completion. If any changes need to be made, you will be notified at that same time.  1. The lab is open from 7:30am to 5:30 pm Monday-Friday  2. No appointment is necessary  3. Fasting (if needed) is 6-8 hours after food and drink; black coffee and water  are okay   If your symptoms worsen or fail to improve, please contact our office for further instruction, or in case of emergency go directly to the emergency room at the closest medical facility.

## 2015-09-23 NOTE — Progress Notes (Signed)
Subjective:    Patient ID: David Kelly, male    DOB: [birth year], 70 y.o.   MRN: [id number]  Chief Complaint  Patient presents with  . Follow-up    requesting a refill of hydrocodone 10-325 mg says that the 7.5-325 are not strong enough, eye drops and ibuprofen refill    HPI:  David Kelly is a 71 y.o. male who  has a past medical history of Arthritis; BPH (benign prostatic hypertrophy); Diabetes mellitus; Glaucoma; and Kidney stones. and presents today for a follow up.  1.) Type 2 diabetes - Currently maintained on metformin. Reports taken medications prescribed and denies adverse side effects. Blood sugars at home have been labile and generally below 150. No new symptoms of end organ damage.   Lab Results  Component Value Date   HGBA1C 6.7 (H) 09/23/2015    2.) Osteoarthritis / Chronic joint pain - Currently maintained on hydrocodone-acetaminophen. Reports taking the medication as prescribed and denies adverse side effects. Notes he continues to experience pain in multiple joints with the current regimen. Notes pain with previously better controlled than the higher dose of hydrocodone-acetaminophen. Has also had some functional limitation secondary to the pain. Denies constipation.  No Known Allergies   Current Outpatient Prescriptions on File Prior to Visit  Medication Sig Dispense Refill  . aspirin 81 MG tablet Take 81 mg by mouth daily.    . brimonidine-timolol (COMBIGAN) 0.2-0.5 % ophthalmic solution Place 1 drop into both eyes every 12 (twelve) hours. 15 mL 1  . metFORMIN (GLUCOPHAGE) 1000 MG tablet Take 1,000 mg by mouth 2 (two) times daily with a meal.    . montelukast (SINGULAIR) 10 MG tablet Take 10 mg by mouth at bedtime.    . tamsulosin (FLOMAX) 0.4 MG CAPS capsule Take 0.4 mg by mouth.    . triamcinolone (KENALOG) 0.025 % cream Apply 1 application topically 2 (two) times daily. 30 g 1   No current facility-administered medications on file prior to visit.       Review of Systems  Constitutional: Negative for chills and fever.  Eyes:       Negative for changes in vision.  Respiratory: Negative for chest tightness and shortness of breath.   Cardiovascular: Negative for chest pain, palpitations and leg swelling.  Endocrine: Negative for polydipsia, polyphagia and polyuria.  Musculoskeletal: Positive for arthralgias.  Neurological: Negative for dizziness, weakness, light-headedness and headaches.      Objective:    BP 122/80 (BP Location: Left Arm, Patient Position: Sitting, Cuff Size: Normal)   Pulse 71   Temp 98 F (36.7 C) (Oral)   Resp 16   Ht 6' (1.829 m)   Wt 204 lb (92.5 kg)   SpO2 96%   BMI 27.67 kg/m  Nursing note and vital signs reviewed.  Physical Exam  Constitutional: He is oriented to person, place, and time. He appears well-developed and well-nourished. No distress.  Cardiovascular: Normal rate, regular rhythm, normal heart sounds and intact distal pulses.   Pulmonary/Chest: Effort normal and breath sounds normal.  Neurological: He is alert and oriented to person, place, and time.  Skin: Skin is warm and dry.  Psychiatric: He has a normal mood and affect. His behavior is normal. Judgment and thought content normal.       Assessment & Plan:   Problem List Items Addressed This Visit      Endocrine   Type 2 diabetes mellitus (David Kelly) - Primary    Type 2 diabetes appears adequately controlled  with current regimen and no adverse side effects. Continue current dosage of metformin. Obtain A1c and microalbumin. Not currently maintained on ACE/ARB or statin. Will schedule time for Pneumovax. Diabetic foot and eye exams are up-to-date.      Relevant Orders   Hemoglobin A1c (Completed)   Urine Microalbumin w/creat. ratio (Completed)   Ambulatory referral to Podiatry     Musculoskeletal and Integument   Osteoarthritis    Continues to experience osteoarthritic related pain in multiple joints that is less controlled with  a decrease dosage of hydrocodone-acetaminophen. His functionality has been affected not been able to move as much. Increase hydrocodone-acetaminophen to 10-3 25. No side effects or constipation. Recommend a controlled substance database reviewed with no irregularities. Encouraged to continue nonpharmacological treatments including ice/heat and exercise therapy.      Relevant Medications   HYDROcodone-acetaminophen (NORCO) 10-325 MG tablet    Other Visit Diagnoses   None.      I have discontinued [name] HYDROcodone-acetaminophen. I am also having him start on HYDROcodone-acetaminophen. Additionally, I am having him maintain his tamsulosin, metFORMIN, montelukast, aspirin, brimonidine-timolol, and triamcinolone.   Meds ordered this encounter  Medications  . HYDROcodone-acetaminophen (NORCO) 10-325 MG tablet    Sig: Take 1 tablet by mouth every 8 (eight) hours as needed.    Dispense:  90 tablet    Refill:  0    Order Specific Question:   Supervising Provider    Answer:   Pricilla Holm A [id number]     Follow-up: Return in about 1 month (around 10/24/2015), or if symptoms worsen or fail to improve.  Mauricio Po, FNP

## 2015-10-05 ENCOUNTER — Encounter: Payer: Self-pay | Admitting: Podiatry

## 2015-10-05 ENCOUNTER — Ambulatory Visit (INDEPENDENT_AMBULATORY_CARE_PROVIDER_SITE_OTHER): Payer: Medicare Other | Admitting: Podiatry

## 2015-10-05 DIAGNOSIS — E119 Type 2 diabetes mellitus without complications: Secondary | ICD-10-CM | POA: Diagnosis not present

## 2015-10-05 DIAGNOSIS — B351 Tinea unguium: Secondary | ICD-10-CM | POA: Diagnosis not present

## 2015-10-05 DIAGNOSIS — M79676 Pain in unspecified toe(s): Secondary | ICD-10-CM | POA: Diagnosis not present

## 2015-10-05 NOTE — Progress Notes (Signed)
   Subjective:    Patient ID: David Kelly, male    DOB: [birth year], 70 y.o.   MRN: [id number]  HPI    Review of Systems  All other systems reviewed and are negative.      Objective:   Physical Exam        Assessment & Plan:

## 2015-10-05 NOTE — Progress Notes (Signed)
Complaint:  Visit Type: Patient returns to my office for diabetic foot exam. Complaint: Patient states" my nails have grown long and thick and become painful to walk and wear shoes".  He says he cut his toenails just last night. Patient has been diagnosed with DM with no foot complications.  No changes to ROS  Podiatric Exam: Vascular: dorsalis pedis and posterior tibial pulses are palpable bilateral. Capillary return is immediate. Temperature gradient is WNL. Skin turgor WNL  Sensorium: Normal Semmes Weinstein monofilament test. Normal tactile sensation bilaterally. Nail Exam: Pt has thick disfigured discolored nails with subungual debris noted bilateral entire nail hallux Ulcer Exam: There is no evidence of ulcer or pre-ulcerative changes or infection. Orthopedic Exam: Muscle tone and strength are WNL. No limitations in general ROM. No crepitus or effusions noted. Foot type and digits show no abnormalities. Bony prominences are unremarkable.HAV  B/L.  Hammer toe B/L Skin: No Porokeratosis. No infection or ulcers  Diagnosis:  Onychomycosis, , Pain in right toe, pain in left toes  Treatment & Plan Procedures and Treatment: Consent by patient was obtained for treatment procedures. The patient understood the discussion of treatment and procedures well. All questions were answered thoroughly reviewed. Debridement of mycotic and hypertrophic toenails, 1 through 5 bilateral and clearing of subungual debris. No ulceration, no infection noted. Diabetic foot exam performed. Return Visit-Office Procedure: Patient instructed to return to the office for a follow up visit 3 months for nail care.    Gardiner Barefoot DPM

## 2015-10-28 ENCOUNTER — Encounter: Payer: Self-pay | Admitting: Family

## 2015-10-28 ENCOUNTER — Ambulatory Visit (INDEPENDENT_AMBULATORY_CARE_PROVIDER_SITE_OTHER): Payer: Medicare Other | Admitting: Family

## 2015-10-28 VITALS — BP 128/80 | HR 76 | Temp 97.9°F | Resp 16 | Ht 72.0 in | Wt 209.0 lb

## 2015-10-28 DIAGNOSIS — M159 Polyosteoarthritis, unspecified: Secondary | ICD-10-CM

## 2015-10-28 DIAGNOSIS — M150 Primary generalized (osteo)arthritis: Secondary | ICD-10-CM

## 2015-10-28 DIAGNOSIS — E119 Type 2 diabetes mellitus without complications: Secondary | ICD-10-CM | POA: Diagnosis not present

## 2015-10-28 DIAGNOSIS — Z23 Encounter for immunization: Secondary | ICD-10-CM | POA: Diagnosis not present

## 2015-10-28 DIAGNOSIS — R300 Dysuria: Secondary | ICD-10-CM | POA: Diagnosis not present

## 2015-10-28 DIAGNOSIS — Z79891 Long term (current) use of opiate analgesic: Secondary | ICD-10-CM | POA: Diagnosis not present

## 2015-10-28 LAB — POCT URINALYSIS DIPSTICK
BILIRUBIN UA: NEGATIVE
Glucose, UA: NEGATIVE
KETONES UA: NEGATIVE
LEUKOCYTES UA: NEGATIVE
Nitrite, UA: NEGATIVE
PH UA: 6
PROTEIN UA: NEGATIVE
RBC UA: NEGATIVE
Spec Grav, UA: >=1.03
Urobilinogen, UA: NEGATIVE

## 2015-10-28 MED ORDER — FLUOCINOLONE ACETONIDE 0.01 % OT OIL: 5.0000 [drp] | TOPICAL_OIL | Freq: Two times a day (BID) | OTIC | 0 refills | Status: DC

## 2015-10-28 MED ORDER — HYDROCODONE-ACETAMINOPHEN 10-325 MG PO TABS: 1.0000 | ORAL_TABLET | Freq: Three times a day (TID) | ORAL | 0 refills | Status: DC | PRN

## 2015-10-28 MED ORDER — METFORMIN HCL 1000 MG PO TABS: 1000.0000 mg | ORAL_TABLET | Freq: Two times a day (BID) | ORAL | 0 refills | Status: DC

## 2015-10-28 NOTE — Progress Notes (Signed)
Subjective:    Patient ID: David Kelly, male    DOB: [birth year], 70 y.o.   MRN: [id number]  Chief Complaint  Patient presents with  . Follow-up    diabetes, dysuria at times    HPI:  David Kelly is a 70 y.o. male who  has a past medical history of Arthritis; BPH (benign prostatic hypertrophy); Diabetes mellitus; Glaucoma; and Kidney stones. and presents today   1.) Type 2 diabetes - Currently maintained on metformin. Reports taking the medication as prescribed and denies adverse side effects or hypoglycemic readings. No symptoms of end organ damage present. Reports blood sugars at home have been averaging around the lower 100s. Does have occasional numbness and tingling.    Lab Results  Component Value Date   HGBA1C 6.7 (H) 09/23/2015    2.) Osteoarthritis/chronic joint pain - currently maintained on hydrocodone-acetaminophen. Reports taking medications prescribed and denies adverse side effects or constipation. Notes that his pain is generally well controlled with occasional exacerbations of a stinging pain. Continues to move around with his cane and does a lot of exercising. Describes the pain as manageable at the current time. Without the medication he is unable to function as much.    No Known Allergies   Outpatient Medications Prior to Visit  Medication Sig Dispense Refill  . aspirin 81 MG tablet Take 81 mg by mouth daily.    . brimonidine-timolol (COMBIGAN) 0.2-0.5 % ophthalmic solution Place 1 drop into both eyes every 12 (twelve) hours. 15 mL 1  . montelukast (SINGULAIR) 10 MG tablet Take 10 mg by mouth at bedtime.    . tamsulosin (FLOMAX) 0.4 MG CAPS capsule Take 0.4 mg by mouth.    . triamcinolone (KENALOG) 0.025 % cream Apply 1 application topically 2 (two) times daily. 30 g 1  . HYDROcodone-acetaminophen (NORCO) 10-325 MG tablet Take 1 tablet by mouth every 8 (eight) hours as needed. 90 tablet 0  . metFORMIN (GLUCOPHAGE) 1000 MG tablet Take 1,000 mg by mouth  2 (two) times daily with a meal.     No facility-administered medications prior to visit.      Review of Systems  Constitutional: Negative for chills and fever.  Musculoskeletal: Positive for arthralgias.  Neurological: Negative for weakness and numbness.      Objective:    BP 128/80 (BP Location: Left Arm, Patient Position: Sitting, Cuff Size: Large)   Pulse 76   Temp 97.9 F (36.6 C) (Oral)   Resp 16   Ht 6' (1.829 m)   Wt 209 lb (94.8 kg)   SpO2 98%   BMI 28.35 kg/m  Nursing note and vital signs reviewed.  Physical Exam  Constitutional: He is oriented to person, place, and time. He appears well-developed and well-nourished. No distress.  Cardiovascular: Normal rate, regular rhythm, normal heart sounds and intact distal pulses.   Pulmonary/Chest: Effort normal and breath sounds normal.  Neurological: He is alert and oriented to person, place, and time.  Skin: Skin is warm and dry.  Psychiatric: He has a normal mood and affect. His behavior is normal. Judgment and thought content normal.       Assessment & Plan:   Problem List Items Addressed This Visit      Endocrine   Type 2 diabetes mellitus ([place])    Type 2 diabetes appears adequate control with A1c of 6.7. Continue current dosage of metformin. Follow-up A1c in 4 months. Not currently maintained on statin or Ace/ARB for CAD risk reduction. Follow-up in  4 months.      Relevant Medications   metFORMIN (GLUCOPHAGE) 1000 MG tablet     Musculoskeletal and Integument   Osteoarthritis - Primary    Osteoarthritis appears stable with increased pain medication and able to function adequately in performing his activities of daily living. No adverse side effects or constipation. Does continue to have occasional pain although is manageable at this time. Urine drug screen obtained. [place] controlled substance database reviewed with no irregularities. Continue current dosage of hydrocodone-acetaminophen.       Relevant Medications   HYDROcodone-acetaminophen (NORCO) 10-325 MG tablet     Other   Dysuria    In office UA negative for leukocytes, nitrates and hematuria. No other urinary symptoms and only occurs sporadically. No evidence of prostate issues or UTI. Continue to monitor and additional work up as needed.       Relevant Orders   POCT urinalysis dipstick (Completed)    Other Visit Diagnoses    Encounter for immunization       Relevant Orders   Flu vaccine HIGH DOSE PF (Completed)       I have changed David Kelly metFORMIN. I am also having him start on Fluocinolone Acetonide. Additionally, I am having him maintain his tamsulosin, montelukast, aspirin, brimonidine-timolol, triamcinolone, and HYDROcodone-acetaminophen.   Meds ordered this encounter  Medications  . metFORMIN (GLUCOPHAGE) 1000 MG tablet    Sig: Take 1 tablet (1,000 mg total) by mouth 2 (two) times daily with a meal.    Dispense:  180 tablet    Refill:  0  . HYDROcodone-acetaminophen (NORCO) 10-325 MG tablet    Sig: Take 1 tablet by mouth every 8 (eight) hours as needed.    Dispense:  90 tablet    Refill:  0  . Fluocinolone Acetonide 0.01 % OIL    Sig: Place 5 drops in ear(s) 2 (two) times daily.    Dispense:  20 mL    Refill:  0    Order Specific Question:   Supervising Provider    Answer:   Pricilla Holm A [id number]     Follow-up: Return in about 2 months (around 12/28/2015), or if symptoms worsen or fail to improve.  David Po, FNP

## 2015-10-28 NOTE — Assessment & Plan Note (Signed)
Osteoarthritis appears stable with increased pain medication and able to function adequately in performing his activities of daily living. No adverse side effects or constipation. Does continue to have occasional pain although is manageable at this time. Urine drug screen obtained. [place] controlled substance database reviewed with no irregularities. Continue current dosage of hydrocodone-acetaminophen.

## 2015-10-28 NOTE — Patient Instructions (Signed)
Thank you for choosing [facility].  SUMMARY AND INSTRUCTIONS:  Medication:  Your prescription(s) have been submitted to your pharmacy or been printed and provided for you. Please take as directed and contact our office if you believe you are having problem(s) with the medication(s) or have any questions.  Follow up:  If your symptoms worsen or fail to improve, please contact our office for further instruction, or in case of emergency go directly to the emergency room at the closest medical facility.     

## 2015-10-28 NOTE — Assessment & Plan Note (Signed)
Type 2 diabetes appears adequate control with A1c of 6.7. Continue current dosage of metformin. Follow-up A1c in 4 months. Not currently maintained on statin or Ace/ARB for CAD risk reduction. Follow-up in 4 months.

## 2015-10-28 NOTE — Assessment & Plan Note (Signed)
In office UA negative for leukocytes, nitrates and hematuria. No other urinary symptoms and only occurs sporadically. No evidence of prostate issues or UTI. Continue to monitor and additional work up as needed.

## 2015-11-27 ENCOUNTER — Telehealth: Payer: Self-pay | Admitting: Family

## 2015-11-27 DIAGNOSIS — M159 Polyosteoarthritis, unspecified: Secondary | ICD-10-CM

## 2015-11-27 DIAGNOSIS — M150 Primary generalized (osteo)arthritis: Principal | ICD-10-CM

## 2015-11-27 NOTE — Telephone Encounter (Signed)
Pt come in and said that he needs a refill on his eye drops, ear drops and pain meds   Best number - Home number

## 2015-11-30 MED ORDER — BRIMONIDINE TARTRATE-TIMOLOL 0.2-0.5 % OP SOLN: 1.0000 [drp] | Freq: Two times a day (BID) | OPHTHALMIC | 1 refills | Status: DC

## 2015-11-30 MED ORDER — FLUOCINOLONE ACETONIDE 0.01 % OT OIL: 5.0000 [drp] | TOPICAL_OIL | Freq: Two times a day (BID) | OTIC | 0 refills | Status: DC

## 2015-11-30 MED ORDER — HYDROCODONE-ACETAMINOPHEN 10-325 MG PO TABS: 1.0000 | ORAL_TABLET | Freq: Three times a day (TID) | ORAL | 0 refills | Status: DC | PRN

## 2015-11-30 NOTE — Telephone Encounter (Signed)
Medications refilled

## 2015-11-30 NOTE — Telephone Encounter (Signed)
LVM letting pt know rx was ready for pick up.

## 2015-12-30 NOTE — Telephone Encounter (Signed)
Patient came in the office today requesting refills on the same medication.  Please call once ready for pickup.

## 2015-12-31 MED ORDER — FLUOCINOLONE ACETONIDE 0.01 % OT OIL: 5.0000 [drp] | TOPICAL_OIL | Freq: Two times a day (BID) | OTIC | 1 refills | Status: DC

## 2015-12-31 MED ORDER — BRIMONIDINE TARTRATE-TIMOLOL 0.2-0.5 % OP SOLN
1.0000 [drp] | Freq: Two times a day (BID) | OPHTHALMIC | 1 refills | Status: DC
Start: 2015-12-31 — End: 2016-02-01

## 2015-12-31 MED ORDER — HYDROCODONE-ACETAMINOPHEN 10-325 MG PO TABS: 1.0000 | ORAL_TABLET | Freq: Three times a day (TID) | ORAL | 0 refills | Status: DC | PRN

## 2015-12-31 NOTE — Addendum Note (Signed)
Addended by: Mauricio Po D on: 12/31/2015 01:26 PM   Modules accepted: Orders

## 2015-12-31 NOTE — Telephone Encounter (Signed)
Medications refilled for pick up.

## 2015-12-31 NOTE — Telephone Encounter (Signed)
Notified patient.

## 2016-01-04 DIAGNOSIS — J069 Acute upper respiratory infection, unspecified: Secondary | ICD-10-CM | POA: Diagnosis not present

## 2016-01-04 DIAGNOSIS — J209 Acute bronchitis, unspecified: Secondary | ICD-10-CM | POA: Diagnosis not present

## 2016-01-04 DIAGNOSIS — R0602 Shortness of breath: Secondary | ICD-10-CM | POA: Diagnosis not present

## 2016-01-11 ENCOUNTER — Ambulatory Visit: Payer: Medicare Other | Admitting: Podiatry

## 2016-01-15 ENCOUNTER — Ambulatory Visit: Payer: Medicare Other | Admitting: Sports Medicine

## 2016-01-22 ENCOUNTER — Encounter: Payer: Self-pay | Admitting: Sports Medicine

## 2016-01-22 ENCOUNTER — Ambulatory Visit (INDEPENDENT_AMBULATORY_CARE_PROVIDER_SITE_OTHER): Payer: Medicare Other | Admitting: Sports Medicine

## 2016-01-22 DIAGNOSIS — E119 Type 2 diabetes mellitus without complications: Secondary | ICD-10-CM | POA: Diagnosis not present

## 2016-01-22 DIAGNOSIS — M79676 Pain in unspecified toe(s): Secondary | ICD-10-CM

## 2016-01-22 DIAGNOSIS — B351 Tinea unguium: Secondary | ICD-10-CM | POA: Diagnosis not present

## 2016-01-22 NOTE — Progress Notes (Signed)
Subjective: David Kelly is a 70 y.o. male patient with history of diabetes who presents to office today complaining of long, painful nails  while ambulating in shoes; unable to trim. Patient states that the glucose reading this morning was 125 mg/dl. Patient denies any new changes in medication or new problems. Patient denies any new cramping, numbness, burning or tingling in the legs.  Patient Active Problem List   Diagnosis Date Noted  . Dysuria 10/28/2015  . Itching of ear 08/13/2015  . Type 2 diabetes mellitus ([place]) 07/15/2015  . Osteoarthritis 07/15/2015   Current Outpatient Prescriptions on File Prior to Visit  Medication Sig Dispense Refill  . aspirin 81 MG tablet Take 81 mg by mouth daily.    . brimonidine-timolol (COMBIGAN) 0.2-0.5 % ophthalmic solution Place 1 drop into both eyes every 12 (twelve) hours. 15 mL 1  . Fluocinolone Acetonide 0.01 % OIL Place 5 drops in ear(s) 2 (two) times daily. 20 mL 1  . HYDROcodone-acetaminophen (NORCO) 10-325 MG tablet Take 1 tablet by mouth every 8 (eight) hours as needed. 90 tablet 0  . metFORMIN (GLUCOPHAGE) 1000 MG tablet Take 1 tablet (1,000 mg total) by mouth 2 (two) times daily with a meal. 180 tablet 0  . montelukast (SINGULAIR) 10 MG tablet Take 10 mg by mouth at bedtime.    . tamsulosin (FLOMAX) 0.4 MG CAPS capsule Take 0.4 mg by mouth.    . triamcinolone (KENALOG) 0.025 % cream Apply 1 application topically 2 (two) times daily. 30 g 1   No current facility-administered medications on file prior to visit.    No Known Allergies  Recent Results (from the past 2160 hour(s))  POCT urinalysis dipstick     Status: None   Collection Time: 10/28/15 10:26 AM  Result Value Ref Range   Color, UA yellow    Clarity, UA clear    Glucose, UA negative    Bilirubin, UA negative    Ketones, UA negative    Spec Grav, UA >=1.030    Blood, UA negative    pH, UA 6.0    Protein, UA negative    Urobilinogen, UA negative    Nitrite, UA  negative    Leukocytes, UA Negative Negative    Objective: General: Patient is awake, alert, and oriented x 3 and in no acute distress.  Integument: Skin is warm, dry and supple bilateral. Nails are tender, long, thickened and dystrophic with subungual debris, consistent with onychomycosis, 1-5 bilateral. No signs of infection. No open lesions or preulcerative lesions present bilateral. Remaining integument unremarkable.  Vasculature:  Dorsalis Pedis pulse 2/4 bilateral. Posterior Tibial pulse 1 /4 bilateral. Capillary fill time <3 sec 1-5 bilateral. Positive hair growth to the level of the digits.Temperature gradient within normal limits. No varicosities present bilateral. No edema present bilateral.   Neurology: The patient has intact sensation measured with a 5.07/10g Semmes Weinstein Monofilament at all pedal sites bilateral . Vibratory sensation diminished bilateral with tuning fork. No Babinski sign present bilateral.   Musculoskeletal: Asymptomatic bunion and hammertoe pedal deformities noted bilateral. Muscular strength 5/5 in all lower extremity muscular groups bilateral without pain on range of motion. No tenderness with calf compression bilateral.  Assessment and Plan: Problem List Items Addressed This Visit    None    Visit Diagnoses    Pain due to onychomycosis of toenail    -  Primary   Diabetes mellitus without complication ([place])          -Examined patient. -Discussed  and educated patient on diabetic foot care, especially with  regards to the vascular, neurological and musculoskeletal systems.  -Stressed the importance of good glycemic control and the detriment of not  controlling glucose levels in relation to the foot. -Mechanically debrided all nails 1-5 bilateral using sterile nail nipper and filed with dremel without incident  -Recommend good supportive shoes for foot type -Answered all patient questions -Patient to return  in 3 months for at risk foot  care -Patient advised to call the office if any problems or questions arise in the meantime.  Landis Martins, DPM

## 2016-02-01 ENCOUNTER — Telehealth: Payer: Self-pay | Admitting: *Deleted

## 2016-02-01 DIAGNOSIS — M150 Primary generalized (osteo)arthritis: Principal | ICD-10-CM

## 2016-02-01 DIAGNOSIS — M159 Polyosteoarthritis, unspecified: Secondary | ICD-10-CM

## 2016-02-01 MED ORDER — BRIMONIDINE TARTRATE-TIMOLOL 0.2-0.5 % OP SOLN: 1.0000 [drp] | Freq: Two times a day (BID) | OPHTHALMIC | 1 refills | Status: DC

## 2016-02-01 MED ORDER — HYDROCODONE-ACETAMINOPHEN 10-325 MG PO TABS: 1.0000 | ORAL_TABLET | Freq: Three times a day (TID) | ORAL | 0 refills | Status: DC | PRN

## 2016-02-01 NOTE — Telephone Encounter (Signed)
Left msg on triage requesting refills on his Hydrocodone, ear drops & eye drops...David Kelly

## 2016-02-01 NOTE — Telephone Encounter (Signed)
Medications refilled. Needs OV for additional.

## 2016-02-01 NOTE — Telephone Encounter (Signed)
Notified pt rx ready for pick-up.../lmb 

## 2016-03-01 ENCOUNTER — Telehealth: Payer: Self-pay | Admitting: Family

## 2016-03-01 DIAGNOSIS — M150 Primary generalized (osteo)arthritis: Principal | ICD-10-CM

## 2016-03-01 DIAGNOSIS — M159 Polyosteoarthritis, unspecified: Secondary | ICD-10-CM

## 2016-03-01 MED ORDER — BRIMONIDINE TARTRATE-TIMOLOL 0.2-0.5 % OP SOLN: 1.0000 [drp] | Freq: Two times a day (BID) | OPHTHALMIC | 1 refills | Status: DC

## 2016-03-01 MED ORDER — FLUOCINOLONE ACETONIDE 0.01 % OT OIL: 5.0000 [drp] | TOPICAL_OIL | Freq: Two times a day (BID) | OTIC | 1 refills | Status: DC

## 2016-03-01 MED ORDER — MONTELUKAST SODIUM 10 MG PO TABS
10.0000 mg | ORAL_TABLET | Freq: Every day | ORAL | 0 refills | Status: DC
Start: 2016-03-01 — End: 2016-04-29

## 2016-03-01 MED ORDER — HYDROCODONE-ACETAMINOPHEN 10-325 MG PO TABS: 1.0000 | ORAL_TABLET | Freq: Three times a day (TID) | ORAL | 0 refills | Status: DC | PRN

## 2016-03-01 NOTE — Telephone Encounter (Signed)
Pt came by and needs refills on   Hydrocodone  Brimonidine Tartrate (eye drops) Singulair   Pt also said he needs refill on ear drops   Pharmacy [facility]

## 2016-03-01 NOTE — Telephone Encounter (Signed)
Patient had pharmacy call in regard to hydrocodone.

## 2016-03-01 NOTE — Telephone Encounter (Signed)
All medications have been sent. Please advise on the pain meds. Thanks

## 2016-03-04 NOTE — Telephone Encounter (Signed)
Pain med up front ready for pick up. Pt is aware.

## 2016-03-24 DIAGNOSIS — R51 Headache: Secondary | ICD-10-CM | POA: Diagnosis not present

## 2016-03-24 DIAGNOSIS — E119 Type 2 diabetes mellitus without complications: Secondary | ICD-10-CM | POA: Diagnosis not present

## 2016-03-24 DIAGNOSIS — R5383 Other fatigue: Secondary | ICD-10-CM | POA: Diagnosis not present

## 2016-03-24 DIAGNOSIS — R200 Anesthesia of skin: Secondary | ICD-10-CM | POA: Diagnosis not present

## 2016-03-24 DIAGNOSIS — R531 Weakness: Secondary | ICD-10-CM | POA: Diagnosis not present

## 2016-03-24 DIAGNOSIS — R202 Paresthesia of skin: Secondary | ICD-10-CM | POA: Diagnosis not present

## 2016-03-31 ENCOUNTER — Telehealth: Payer: Self-pay | Admitting: *Deleted

## 2016-03-31 NOTE — Telephone Encounter (Signed)
Rec'd call pt requesting refill on his Hydrocodone.../lmb 

## 2016-04-01 ENCOUNTER — Telehealth: Payer: Self-pay | Admitting: Family

## 2016-04-01 DIAGNOSIS — L299 Pruritus, unspecified: Secondary | ICD-10-CM

## 2016-04-01 DIAGNOSIS — M159 Polyosteoarthritis, unspecified: Secondary | ICD-10-CM

## 2016-04-01 DIAGNOSIS — M150 Primary generalized (osteo)arthritis: Secondary | ICD-10-CM

## 2016-04-01 DIAGNOSIS — E119 Type 2 diabetes mellitus without complications: Secondary | ICD-10-CM

## 2016-04-01 MED ORDER — BRIMONIDINE TARTRATE-TIMOLOL 0.2-0.5 % OP SOLN: 1.0000 [drp] | Freq: Two times a day (BID) | OPHTHALMIC | 1 refills | Status: DC

## 2016-04-01 MED ORDER — TRIAMCINOLONE ACETONIDE 0.025 % EX CREA: 1.0000 "application " | TOPICAL_CREAM | Freq: Two times a day (BID) | CUTANEOUS | 1 refills | Status: DC

## 2016-04-01 MED ORDER — METFORMIN HCL 1000 MG PO TABS: 1000.0000 mg | ORAL_TABLET | Freq: Two times a day (BID) | ORAL | 0 refills | Status: DC

## 2016-04-01 NOTE — Telephone Encounter (Signed)
Called pt no answer LMOM w/Greg response../lmb 

## 2016-04-01 NOTE — Telephone Encounter (Signed)
Needs OV for controlled substance as he has not been seen since September - noted at last refill.

## 2016-04-01 NOTE — Telephone Encounter (Signed)
All medications have been sent. Please advise on pain meds.

## 2016-04-01 NOTE — Telephone Encounter (Signed)
Got appt scheduled

## 2016-04-01 NOTE — Telephone Encounter (Signed)
Needs office visit as he has not been seen since September.

## 2016-04-01 NOTE — Telephone Encounter (Signed)
Patient requesting refills on metformin and triamcinolone .025% and combigan 0.2-0.5%.  Also, see request for hydrocodone.   Patient uses [facility].

## 2016-04-04 ENCOUNTER — Ambulatory Visit (INDEPENDENT_AMBULATORY_CARE_PROVIDER_SITE_OTHER): Payer: Medicare Other | Admitting: Family

## 2016-04-04 ENCOUNTER — Encounter: Payer: Self-pay | Admitting: Family

## 2016-04-04 VITALS — BP 118/78 | HR 78 | Temp 98.0°F | Ht 72.0 in | Wt 214.0 lb

## 2016-04-04 DIAGNOSIS — M150 Primary generalized (osteo)arthritis: Secondary | ICD-10-CM

## 2016-04-04 DIAGNOSIS — M159 Polyosteoarthritis, unspecified: Secondary | ICD-10-CM

## 2016-04-04 MED ORDER — HYDROCODONE-ACETAMINOPHEN 10-325 MG PO TABS: 1.0000 | ORAL_TABLET | Freq: Three times a day (TID) | ORAL | 0 refills | Status: DC | PRN

## 2016-04-04 NOTE — Patient Instructions (Addendum)
Thank you for choosing [facility].  SUMMARY AND INSTRUCTIONS:  Medication:  Please continue to take your medications as prescribed.   Your prescription(s) have been submitted to your pharmacy or been printed and provided for you. Please take as directed and contact our office if you believe you are having problem(s) with the medication(s) or have any questions.  Follow up:  If your symptoms worsen or fail to improve, please contact our office for further instruction, or in case of emergency go directly to the emergency room at the closest medical facility.     

## 2016-04-04 NOTE — Assessment & Plan Note (Addendum)
Symptoms and exam remain consistent with osteoarthritis of multiple joints currently maintained on hydrocodone-acetaminophen with mild improvements in pain with the current regimen. No adverse side effects. Emphasize importance of nonpharmacological treatments and physical activity. Counseled regarding risks associated with long-term opioid/narcotic use. [place] controlled substance database reviewed with no irregularities. Continue current dosage of hydrocodone-acetaminophen. Most recent urine drug screen on 07/15/15

## 2016-04-04 NOTE — Progress Notes (Signed)
Subjective:    Patient ID: David Kelly, male    DOB: [birth year], 71 y.o.   MRN: [id number]  Chief Complaint  Patient presents with  . Knee Pain    Pt need refill on pain medication.    HPI:  David Kelly is a 71 y.o. male who  has a past medical history of Arthritis; BPH (benign prostatic hypertrophy); Diabetes mellitus; Glaucoma; and Kidney stones. and presents today for a follow up office visit.   Osteoarthritis - Currently maintained on hydrocodone-acetaminophen. Reports taking the medication as prescribed and denies adverse side. Continues to experience pain located in his knees, arms and back. He is able to function at a pain level at 7.5/8 out of 10 with the current medication regimen that would be increased to "into the hospital" without the medication. Not currently using any ice or heat packs. Exercise consists of walking the dog.     No Known Allergies    Outpatient Medications Prior to Visit  Medication Sig Dispense Refill  . aspirin 81 MG tablet Take 81 mg by mouth daily.    . brimonidine-timolol (COMBIGAN) 0.2-0.5 % ophthalmic solution Place 1 drop into both eyes every 12 (twelve) hours. 15 mL 1  . Fluocinolone Acetonide 0.01 % OIL Place 5 drops in ear(s) 2 (two) times daily. 20 mL 1  . metFORMIN (GLUCOPHAGE) 1000 MG tablet Take 1 tablet (1,000 mg total) by mouth 2 (two) times daily with a meal. 180 tablet 0  . montelukast (SINGULAIR) 10 MG tablet Take 1 tablet (10 mg total) by mouth at bedtime. 90 tablet 0  . tamsulosin (FLOMAX) 0.4 MG CAPS capsule Take 0.4 mg by mouth.    . triamcinolone (KENALOG) 0.025 % cream Apply 1 application topically 2 (two) times daily. 30 g 1  . HYDROcodone-acetaminophen (NORCO) 10-325 MG tablet Take 1 tablet by mouth every 8 (eight) hours as needed. 90 tablet 0   No facility-administered medications prior to visit.       Past Surgical History:  Procedure Laterality Date  . left total knee replacement    . TOTAL KNEE  ARTHROPLASTY  04/04/2011   Procedure: TOTAL KNEE ARTHROPLASTY;  Surgeon: Rudean Haskell, MD;  Location: [facility];  Service: Orthopedics;  Laterality: Left;      Past Medical History:  Diagnosis Date  . Arthritis   . BPH (benign prostatic hypertrophy)   . Diabetes mellitus    DX  5 YR AGO  . Glaucoma   . Kidney stones       Review of Systems  Constitutional: Negative for chills and fever.  Respiratory: Negative for chest tightness and shortness of breath.   Cardiovascular: Negative for chest pain, palpitations and leg swelling.  Musculoskeletal: Positive for back pain.       Positive for bilateral knee and arm pain.  Neurological: Negative for weakness and light-headedness.      Objective:    BP 118/78   Pulse 78   Temp 98 F (36.7 C)   Ht 6' (1.829 m)   Wt 214 lb (97.1 kg)   SpO2 97%   BMI 29.02 kg/m  Nursing note and vital signs reviewed.  Physical Exam  Constitutional: He is oriented to person, place, and time. He appears well-developed and well-nourished. No distress.  Cardiovascular: Normal rate, regular rhythm, normal heart sounds and intact distal pulses.   Pulmonary/Chest: Effort normal and breath sounds normal.  Musculoskeletal:  Bilateral knees - no obvious deformity, discoloration, or edema. There is  generalized tenderness on the anterior aspect of the knee on medial or lateral joint lines. Range of motion appears normal. Strength slightly decreased bilaterally. Distal pulses and sensation are intact and appropriate.  Low back - no obvious deformity, discoloration, or edema. Mild tenderness elicited over bilateral lumbar paraspinal musculature with no crepitus or deformity. Tone of muscle appears normal. There is decreased flexion secondary to discomfort. All other motions are normal. Distal pulses and sensation are intact and appropriate. Negative straight leg raise and Faber's.  Neurological: He is alert and oriented to person, place, and time.  Skin: Skin is  warm and dry.  Psychiatric: He has a normal mood and affect. His behavior is normal. Judgment and thought content normal.       Assessment & Plan:   Problem List Items Addressed This Visit      Musculoskeletal and Integument   Osteoarthritis - Primary    Symptoms and exam remain consistent with osteoarthritis of multiple joints currently maintained on hydrocodone-acetaminophen with mild improvements in pain with the current regimen. No adverse side effects. Emphasize importance of nonpharmacological treatments and physical activity. Counseled regarding risks associated with long-term opioid/narcotic use. [place] controlled substance database reviewed with no irregularities. Continue current dosage of hydrocodone-acetaminophen. Most recent urine drug screen on 07/15/15      Relevant Medications   HYDROcodone-acetaminophen (NORCO) 10-325 MG tablet       I am having Mr. Manella maintain his tamsulosin, aspirin, montelukast, Fluocinolone Acetonide, triamcinolone, metFORMIN, brimonidine-timolol, and HYDROcodone-acetaminophen.   Meds ordered this encounter  Medications  . HYDROcodone-acetaminophen (NORCO) 10-325 MG tablet    Sig: Take 1 tablet by mouth every 8 (eight) hours as needed.    Dispense:  90 tablet    Refill:  0    Order Specific Question:   Supervising Provider    Answer:   Pricilla Holm A [id number]     Follow-up: Return in about 2 months (around 06/02/2016), or if symptoms worsen or fail to improve.  Mauricio Po, FNP

## 2016-04-05 NOTE — Progress Notes (Signed)
Reviewed and agree with treatment plan. Hoyt Koch, MD

## 2016-04-22 ENCOUNTER — Ambulatory Visit (INDEPENDENT_AMBULATORY_CARE_PROVIDER_SITE_OTHER): Payer: Medicare Other | Admitting: Sports Medicine

## 2016-04-22 ENCOUNTER — Encounter: Payer: Self-pay | Admitting: Sports Medicine

## 2016-04-22 DIAGNOSIS — E119 Type 2 diabetes mellitus without complications: Secondary | ICD-10-CM

## 2016-04-22 DIAGNOSIS — M79676 Pain in unspecified toe(s): Secondary | ICD-10-CM | POA: Diagnosis not present

## 2016-04-22 DIAGNOSIS — B351 Tinea unguium: Secondary | ICD-10-CM | POA: Diagnosis not present

## 2016-04-22 NOTE — Progress Notes (Signed)
Subjective: David Kelly is a 71 y.o. male patient with history of diabetes who presents to office today complaining of long, painful nails  while ambulating in shoes; unable to trim. Patient states that the glucose reading this morning was 160 mg/dl. Patient denies any new changes in medication or new problems. Patient denies any new cramping, numbness, burning or tingling in the legs.  Only wants me to cut nails since last time they were cut too close  Patient Active Problem List   Diagnosis Date Noted  . Dysuria 10/28/2015  . Itching of ear 08/13/2015  . Type 2 diabetes mellitus ([place]) 07/15/2015  . Osteoarthritis 07/15/2015   Current Outpatient Prescriptions on File Prior to Visit  Medication Sig Dispense Refill  . aspirin 81 MG tablet Take 81 mg by mouth daily.    . brimonidine-timolol (COMBIGAN) 0.2-0.5 % ophthalmic solution Place 1 drop into both eyes every 12 (twelve) hours. 15 mL 1  . Fluocinolone Acetonide 0.01 % OIL Place 5 drops in ear(s) 2 (two) times daily. 20 mL 1  . HYDROcodone-acetaminophen (NORCO) 10-325 MG tablet Take 1 tablet by mouth every 8 (eight) hours as needed. 90 tablet 0  . metFORMIN (GLUCOPHAGE) 1000 MG tablet Take 1 tablet (1,000 mg total) by mouth 2 (two) times daily with a meal. 180 tablet 0  . montelukast (SINGULAIR) 10 MG tablet Take 1 tablet (10 mg total) by mouth at bedtime. 90 tablet 0  . tamsulosin (FLOMAX) 0.4 MG CAPS capsule Take 0.4 mg by mouth.    . triamcinolone (KENALOG) 0.025 % cream Apply 1 application topically 2 (two) times daily. 30 g 1   No current facility-administered medications on file prior to visit.    No Known Allergies  No results found for this or any previous visit (from the past 2160 hour(s)).  Objective: General: Patient is awake, alert, and oriented x 3 and in no acute distress.  Integument: Skin is warm, dry and supple bilateral. Nails are tender, long, thickened and dystrophic with subungual debris, consistent with  onychomycosis, 1-5 bilateral. No signs of infection. No open lesions or preulcerative lesions present bilateral. Remaining integument unremarkable.  Vasculature:  Dorsalis Pedis pulse 2/4 bilateral. Posterior Tibial pulse 1 /4 bilateral. Capillary fill time <3 sec 1-5 bilateral. Positive hair growth to the level of the digits.Temperature gradient within normal limits. No varicosities present bilateral. No edema present bilateral.   Neurology: The patient has intact sensation measured with a 5.07/10g Semmes Weinstein Monofilament at all pedal sites bilateral . Vibratory sensation diminished bilateral with tuning fork. No Babinski sign present bilateral.   Musculoskeletal: Asymptomatic bunion and hammertoe pedal deformities noted bilateral. Muscular strength 5/5 in all lower extremity muscular groups bilateral without pain on range of motion. No tenderness with calf compression bilateral.  Assessment and Plan: Problem List Items Addressed This Visit    None    Visit Diagnoses    Pain due to onychomycosis of toenail    -  Primary   Diabetes mellitus without complication ([place])          -Examined patient. -Discussed and educated patient on diabetic foot care, especially with  regards to the vascular, neurological and musculoskeletal systems.  -Stressed the importance of good glycemic control and the detriment of not  controlling glucose levels in relation to the foot. -Mechanically debrided all nails 1-5 bilateral using sterile nail nipper and filed with dremel without incident  -Recommend good supportive shoes for foot type -Answered all patient questions -Patient to return  in 3 months for at risk foot care -Patient advised to call the office if any problems or questions arise in the meantime.  Landis Martins, DPM

## 2016-04-29 ENCOUNTER — Telehealth: Payer: Self-pay | Admitting: Family

## 2016-04-29 DIAGNOSIS — M159 Polyosteoarthritis, unspecified: Secondary | ICD-10-CM

## 2016-04-29 DIAGNOSIS — M150 Primary generalized (osteo)arthritis: Principal | ICD-10-CM

## 2016-04-29 MED ORDER — BRIMONIDINE TARTRATE-TIMOLOL 0.2-0.5 % OP SOLN: 1.0000 [drp] | Freq: Two times a day (BID) | OPHTHALMIC | 1 refills | Status: DC

## 2016-04-29 MED ORDER — FLUOCINOLONE ACETONIDE 0.01 % OT OIL: 5.0000 [drp] | TOPICAL_OIL | Freq: Two times a day (BID) | OTIC | 1 refills | Status: DC

## 2016-04-29 MED ORDER — MONTELUKAST SODIUM 10 MG PO TABS: 10.0000 mg | ORAL_TABLET | Freq: Every day | ORAL | 1 refills | Status: DC

## 2016-04-29 MED ORDER — HYDROCODONE-ACETAMINOPHEN 10-325 MG PO TABS: 1.0000 | ORAL_TABLET | Freq: Three times a day (TID) | ORAL | 0 refills | Status: DC | PRN

## 2016-04-29 MED ORDER — MONTELUKAST SODIUM 10 MG PO TABS: 10.0000 mg | ORAL_TABLET | Freq: Every day | ORAL | 0 refills | Status: DC

## 2016-04-29 NOTE — Telephone Encounter (Signed)
Pt needs refill on   Eye drops Ear drops  Hydrocodone  Singulair   Pharmacy [facility]

## 2016-04-29 NOTE — Telephone Encounter (Signed)
Last refill for pain med was 04/04/16. The rest of the medications were refilled.

## 2016-04-29 NOTE — Telephone Encounter (Signed)
Medications refilled and dated per previous refill.

## 2016-04-29 NOTE — Telephone Encounter (Signed)
Pt aware.

## 2016-05-04 ENCOUNTER — Telehealth: Payer: Self-pay

## 2016-05-04 NOTE — Telephone Encounter (Signed)
Called to let pt know that in order to get forms filled out for diabetic shoes we would need him to make an appointment with Marya Amsler to have his feet checked and have notes supporting the fact of him needing diabetic shoes. Pt states he will call back to do so at his convenience to schedule an appointment.

## 2016-05-17 ENCOUNTER — Encounter (HOSPITAL_COMMUNITY): Payer: Self-pay | Admitting: Emergency Medicine

## 2016-05-17 DIAGNOSIS — E119 Type 2 diabetes mellitus without complications: Secondary | ICD-10-CM | POA: Insufficient documentation

## 2016-05-17 DIAGNOSIS — Z79899 Other long term (current) drug therapy: Secondary | ICD-10-CM | POA: Insufficient documentation

## 2016-05-17 DIAGNOSIS — Z87891 Personal history of nicotine dependence: Secondary | ICD-10-CM | POA: Insufficient documentation

## 2016-05-17 DIAGNOSIS — Z7984 Long term (current) use of oral hypoglycemic drugs: Secondary | ICD-10-CM | POA: Diagnosis not present

## 2016-05-17 DIAGNOSIS — Z96652 Presence of left artificial knee joint: Secondary | ICD-10-CM | POA: Insufficient documentation

## 2016-05-17 DIAGNOSIS — Z7982 Long term (current) use of aspirin: Secondary | ICD-10-CM | POA: Diagnosis not present

## 2016-05-17 DIAGNOSIS — N1339 Other hydronephrosis: Secondary | ICD-10-CM | POA: Insufficient documentation

## 2016-05-17 DIAGNOSIS — R109 Unspecified abdominal pain: Secondary | ICD-10-CM | POA: Diagnosis present

## 2016-05-17 DIAGNOSIS — N132 Hydronephrosis with renal and ureteral calculous obstruction: Secondary | ICD-10-CM | POA: Diagnosis not present

## 2016-05-17 DIAGNOSIS — N201 Calculus of ureter: Secondary | ICD-10-CM | POA: Insufficient documentation

## 2016-05-17 LAB — URINALYSIS, ROUTINE W REFLEX MICROSCOPIC
Bilirubin Urine: NEGATIVE
GLUCOSE, UA: NEGATIVE mg/dL
HGB URINE DIPSTICK: NEGATIVE
KETONES UR: NEGATIVE mg/dL
Leukocytes, UA: NEGATIVE
Nitrite: NEGATIVE
PROTEIN: NEGATIVE mg/dL
Specific Gravity, Urine: 1.018 (ref 1.005–1.030)
pH: 5 (ref 5.0–8.0)

## 2016-05-17 LAB — COMPREHENSIVE METABOLIC PANEL
ALK PHOS: 55 U/L (ref 38–126)
ALT: 30 U/L (ref 17–63)
AST: 30 U/L (ref 15–41)
Albumin: 4 g/dL (ref 3.5–5.0)
Anion gap: 13 (ref 5–15)
BUN: 13 mg/dL (ref 6–20)
CHLORIDE: 107 mmol/L (ref 101–111)
CO2: 22 mmol/L (ref 22–32)
CREATININE: 0.79 mg/dL (ref 0.61–1.24)
Calcium: 9.5 mg/dL (ref 8.9–10.3)
GFR calc Af Amer: >60 mL/min (ref >60)
Glucose, Bld: 177 mg/dL — ABNORMAL HIGH (ref 65–99)
Potassium: 4.1 mmol/L (ref 3.5–5.1)
Sodium: 142 mmol/L (ref 135–145)
Total Bilirubin: 0.3 mg/dL (ref 0.3–1.2)
Total Protein: 7.2 g/dL (ref 6.5–8.1)

## 2016-05-17 LAB — CBC
HCT: 38.2 % — ABNORMAL LOW (ref 39.0–52.0)
Hemoglobin: 12.1 g/dL — ABNORMAL LOW (ref 13.0–17.0)
MCH: 26.7 pg (ref 26.0–34.0)
MCHC: 31.7 g/dL (ref 30.0–36.0)
MCV: 84.3 fL (ref 78.0–100.0)
PLATELETS: 278 10*3/uL (ref 150–400)
RBC: 4.53 MIL/uL (ref 4.22–5.81)
RDW: 14.5 % (ref 11.5–15.5)
WBC: 7.9 10*3/uL (ref 4.0–10.5)

## 2016-05-17 LAB — LIPASE, BLOOD: LIPASE: 36 U/L (ref 11–51)

## 2016-05-17 NOTE — ED Triage Notes (Addendum)
Patient reports right lateral abdominal pain with urinary frequency onset 2 days ago , denies emesis or diarrhea , no fever or chills . Pt. added mild fatigue this week .

## 2016-05-18 ENCOUNTER — Emergency Department (HOSPITAL_COMMUNITY): Payer: Medicare Other

## 2016-05-18 ENCOUNTER — Emergency Department (HOSPITAL_COMMUNITY)
Admission: EM | Admit: 2016-05-18 | Discharge: 2016-05-18 | Disposition: A | Discharge status: Home / Self Care | Payer: Medicare Other | Attending: Emergency Medicine | Admitting: Emergency Medicine

## 2016-05-18 DIAGNOSIS — N132 Hydronephrosis with renal and ureteral calculous obstruction: Secondary | ICD-10-CM | POA: Diagnosis not present

## 2016-05-18 DIAGNOSIS — N1330 Unspecified hydronephrosis: Secondary | ICD-10-CM

## 2016-05-18 DIAGNOSIS — N201 Calculus of ureter: Secondary | ICD-10-CM | POA: Diagnosis not present

## 2016-05-18 MED ORDER — HYDROCODONE-ACETAMINOPHEN 5-325 MG PO TABS
1.0000 | ORAL_TABLET | Freq: Once | ORAL | Status: AC
  Administered 2016-05-18: 1 via ORAL
  Filled 2016-05-18: qty 1

## 2016-05-18 MED ORDER — ONDANSETRON 4 MG PO TBDP
8.0000 mg | ORAL_TABLET | Freq: Once | ORAL | Status: AC
  Administered 2016-05-18: 8 mg via ORAL
  Filled 2016-05-18: qty 2

## 2016-05-18 NOTE — ED Provider Notes (Signed)
[facility] DEPT Provider Note   CSN: [id number] Arrival date & time: 05/17/16  2004  By signing my name below, I, Oleh Genin, attest that this documentation has been prepared under the direction and in the presence of Ripley Fraise, MD. Electronically Signed: Oleh Genin, Scribe. 05/18/16. 1:01 AM.   History   Chief Complaint Chief Complaint  Patient presents with  . Abdominal Pain  . Urinary Frequency  . Fatigue    HPI David Kelly is a 71 y.o. male with history of diabetes and prior nephrolithiasis who presents to the ED with abdominal pain. This patient states that in the last week he has experienced R lateral vs R flank pain as well as urinary frequency in the last 3 days. No dysuria. No hematuria. At interview, he is also reporting a mild productive cough. No hemoptysis. No diarrhea. No fever, vomiting, or diarrhea. No chest pain. He does have history of a renal stone just over 1 year ago.   The history is provided by the patient. No language interpreter was used.  Abdominal Pain   This is a new problem. Episode onset: 1 week. The problem has not changed since onset.The pain is associated with an unknown factor. Pain location: R lateral. Associated symptoms include frequency. Pertinent negatives include fever, diarrhea, nausea, vomiting, dysuria, hematuria and headaches.    Past Medical History:  Diagnosis Date  . Arthritis   . BPH (benign prostatic hypertrophy)   . Diabetes mellitus    DX  5 YR AGO  . Glaucoma   . Kidney stones     Patient Active Problem List   Diagnosis Date Noted  . Dysuria 10/28/2015  . Itching of ear 08/13/2015  . Type 2 diabetes mellitus ([place]) 07/15/2015  . Osteoarthritis 07/15/2015    Past Surgical History:  Procedure Laterality Date  . left total knee replacement    . TOTAL KNEE ARTHROPLASTY  04/04/2011   Procedure: TOTAL KNEE ARTHROPLASTY;  Surgeon: Rudean Haskell, MD;  Location: [facility];  Service: Orthopedics;   Laterality: Left;       Home Medications    Prior to Admission medications   Medication Sig Start Date End Date Taking? Authorizing Provider  aspirin 81 MG tablet Take 81 mg by mouth daily.    Historical Provider, MD  brimonidine-timolol (COMBIGAN) 0.2-0.5 % ophthalmic solution Place 1 drop into both eyes every 12 (twelve) hours. 04/29/16   Golden Circle, FNP  Fluocinolone Acetonide 0.01 % OIL Place 5 drops in ear(s) 2 (two) times daily. 04/29/16   Golden Circle, FNP  HYDROcodone-acetaminophen (NORCO) 10-325 MG tablet Take 1 tablet by mouth every 8 (eight) hours as needed. 04/29/16   Golden Circle, FNP  metFORMIN (GLUCOPHAGE) 1000 MG tablet Take 1 tablet (1,000 mg total) by mouth 2 (two) times daily with a meal. 04/01/16   Golden Circle, FNP  montelukast (SINGULAIR) 10 MG tablet Take 1 tablet (10 mg total) by mouth at bedtime. 04/29/16   Golden Circle, FNP  tamsulosin (FLOMAX) 0.4 MG CAPS capsule Take 0.4 mg by mouth.    Historical Provider, MD  triamcinolone (KENALOG) 0.025 % cream Apply 1 application topically 2 (two) times daily. 04/01/16   Golden Circle, FNP    Family History Family History  Problem Relation Age of Onset  . Arthritis Mother   . Asthma Paternal Grandfather   . Diabetes Sister     Social History Social History  Substance Use Topics  . Smoking status: Former Smoker  Packs/day: 1.50    Years: 45.00    Types: Cigarettes    Quit date: 03/21/2006  . Smokeless tobacco: Current User    Types: Chew  . Alcohol use No     Allergies   Patient has no known allergies.   Review of Systems Review of Systems  Constitutional: Negative for fever.  Respiratory: Positive for cough.   Gastrointestinal: Positive for abdominal pain. Negative for diarrhea, nausea and vomiting.  Genitourinary: Positive for flank pain and frequency. Negative for dysuria and hematuria.  Neurological: Negative for headaches.  All other systems reviewed and are  negative.    Physical Exam Updated Vital Signs BP 115/71   Pulse (!) 103   Temp 98.3 F (36.8 C) (Oral)   Resp 18   Wt 212 lb 4.8 oz (96.3 kg)   SpO2 96%   BMI 28.79 kg/m   Physical Exam CONSTITUTIONAL: Well developed/well nourished HEAD: Normocephalic/atraumatic EYES: EOMI/PERRL ENMT: Mucous membranes moist NECK: supple no meningeal signs SPINE/BACK:entire spine nontender CV: S1/S2 noted, no murmurs/rubs/gallops noted LUNGS: Lungs are clear to auscultation bilaterally, no apparent distress ABDOMEN: soft, nontender, no rebound or guarding, bowel sounds noted throughout abdomen GU: R CVA tenderness NEURO: Pt is awake/alert/appropriate, moves all extremitiesx4.  No facial droop.   EXTREMITIES: pulses normal/equal, full ROM SKIN: warm, color normal PSYCH: no abnormalities of mood noted, alert and oriented to situation  ED Treatments / Results  Labs (all labs ordered are listed, but only abnormal results are displayed) Labs Reviewed  COMPREHENSIVE METABOLIC PANEL - Abnormal; Notable for the following:       Result Value   Glucose, Bld 177 (*)    All other components within normal limits  CBC - Abnormal; Notable for the following:    Hemoglobin 12.1 (*)    HCT 38.2 (*)    All other components within normal limits  LIPASE, BLOOD  URINALYSIS, ROUTINE W REFLEX MICROSCOPIC    EKG  EKG Interpretation  Date/Time:  Tuesday May 17 2016 20:30:03 EDT Ventricular Rate:  104 PR Interval:  174 QRS Duration: 64 QT Interval:  326 QTC Calculation: 428 R Axis:   -2 Text Interpretation:  Sinus tachycardia Low voltage QRS Septal infarct , age undetermined Abnormal ECG Confirmed by Christy Gentles  MD, Fitzroy Mikami ([id number]) on 05/18/2016 12:59:26 AM       Radiology Ct Renal Stone Study  Result Date: 05/18/2016 CLINICAL DATA:  Right lower quadrant abdominal pain. EXAM: CT ABDOMEN AND PELVIS WITHOUT CONTRAST TECHNIQUE: Multidetector CT imaging of the abdomen and pelvis was performed following  the standard protocol without IV contrast. COMPARISON:  09/30/2014 FINDINGS: Lower chest: No acute abnormality. Hepatobiliary: Cholelithiasis. Normal bile ducts. Unremarkable unenhanced appearances of the liver except for a stable subcentimeter hypodensity in the left lobe dome which is unchanged from at least 11/25/2013. Pancreas: Unremarkable. No pancreatic ductal dilatation or surrounding inflammatory changes. Spleen: Normal in size without focal abnormality. Adrenals/Urinary Tract: Both adrenals are normal. There is marked hydronephrosis and hydroureter on the right. There is a 7 x 10 mm calculus which appears to be within the urinary bladder lumen and this may represent recent stone passage. Left kidney has a 4 x 7 mm calculus in the midpole collecting system. No left hydronephrosis or ureteral dilatation. Stomach/Bowel: Stomach is within normal limits. Appendix is normal. Extensive colonic diverticulosis. No evidence of bowel wall thickening, distention, or inflammatory changes. Vascular/Lymphatic: The abdominal aorta is normal in caliber with moderate atherosclerotic calcification. No adenopathy in the abdomen or pelvis. Reproductive: Unremarkable  Other: No focal inflammation.  No ascites. Musculoskeletal: No significant skeletal lesion. IMPRESSION: 1. Normal appendix 2. Marked right hydronephrosis and ureteral dilatation. There is an 8 x 10 mm calculus which probably is in the urinary bladder lumen and this could represent recent stone passage. Left nephrolithiasis. 3. Colonic diverticulosis.  No evidence of acute diverticulitis. 4. Aortic atherosclerosis. Electronically Signed   By: Andreas Newport M.D.   On: 05/18/2016 02:19    Procedures Procedures (including critical care time)  Medications Ordered in ED Medications  ondansetron (ZOFRAN-ODT) disintegrating tablet 8 mg (8 mg Oral Given 05/18/16 0123)  HYDROcodone-acetaminophen (NORCO/VICODIN) 5-325 MG per tablet 1 tablet (1 tablet Oral Given  05/18/16 0123)     Initial Impression / Assessment and Plan / ED Course  I have reviewed the triage vital signs and the nursing notes.  Pertinent labs & imaging results that were available during my care of the patient were reviewed by me and considered in my medical decision making (see chart for details).     Pt improved No vomiting Reports he feels well He is requesting d/c home Referred to urology He has pain meds at home Narcotic database reviewed and considered in decision making  BP (!) 118/91 (BP Location: Right Arm)   Pulse 79   Temp 98.2 F (36.8 C) (Oral)   Resp 18   Wt 96.3 kg   SpO2 99%   BMI 28.79 kg/m   Final Clinical Impressions(s) / ED Diagnoses   Final diagnoses:  Hydronephrosis of right kidney  Right ureteral stone    New Prescriptions New Prescriptions   No medications on file  I personally performed the services described in this documentation, which was scribed in my presence. The recorded information has been reviewed and is accurate.       Ripley Fraise, MD 05/18/16 [phone]

## 2016-05-18 NOTE — ED Notes (Signed)
ED Provider at bedside. 

## 2016-05-18 NOTE — ED Notes (Signed)
Patient transported to CT 

## 2016-05-18 NOTE — ED Notes (Signed)
Pt returned from CT °

## 2016-05-20 ENCOUNTER — Ambulatory Visit (HOSPITAL_COMMUNITY): Payer: Medicare Other | Admitting: Registered Nurse

## 2016-05-20 ENCOUNTER — Ambulatory Visit (HOSPITAL_COMMUNITY)
Admission: RE | Admit: 2016-05-20 | Discharge: 2016-05-20 | Disposition: A | Discharge status: Home / Self Care | Payer: Medicare Other | Source: Ambulatory Visit | Attending: Urology | Admitting: Urology

## 2016-05-20 ENCOUNTER — Ambulatory Visit (HOSPITAL_COMMUNITY): Payer: Medicare Other

## 2016-05-20 ENCOUNTER — Encounter (HOSPITAL_COMMUNITY)
Admission: RE | Disposition: A | Discharge status: Home / Self Care | Payer: Self-pay | Source: Ambulatory Visit | Attending: Urology

## 2016-05-20 ENCOUNTER — Encounter (HOSPITAL_COMMUNITY): Payer: Self-pay | Admitting: *Deleted

## 2016-05-20 ENCOUNTER — Other Ambulatory Visit: Payer: Self-pay | Admitting: Urology

## 2016-05-20 DIAGNOSIS — N201 Calculus of ureter: Secondary | ICD-10-CM | POA: Diagnosis not present

## 2016-05-20 DIAGNOSIS — J449 Chronic obstructive pulmonary disease, unspecified: Secondary | ICD-10-CM | POA: Insufficient documentation

## 2016-05-20 DIAGNOSIS — Z79899 Other long term (current) drug therapy: Secondary | ICD-10-CM | POA: Insufficient documentation

## 2016-05-20 DIAGNOSIS — N2889 Other specified disorders of kidney and ureter: Secondary | ICD-10-CM | POA: Insufficient documentation

## 2016-05-20 DIAGNOSIS — E119 Type 2 diabetes mellitus without complications: Secondary | ICD-10-CM | POA: Diagnosis not present

## 2016-05-20 DIAGNOSIS — Z7982 Long term (current) use of aspirin: Secondary | ICD-10-CM | POA: Insufficient documentation

## 2016-05-20 DIAGNOSIS — Z87891 Personal history of nicotine dependence: Secondary | ICD-10-CM | POA: Diagnosis not present

## 2016-05-20 DIAGNOSIS — I2510 Atherosclerotic heart disease of native coronary artery without angina pectoris: Secondary | ICD-10-CM | POA: Insufficient documentation

## 2016-05-20 DIAGNOSIS — Z7984 Long term (current) use of oral hypoglycemic drugs: Secondary | ICD-10-CM | POA: Diagnosis not present

## 2016-05-20 DIAGNOSIS — Q6231 Congenital ureterocele, orthotopic: Secondary | ICD-10-CM | POA: Diagnosis not present

## 2016-05-20 DIAGNOSIS — I252 Old myocardial infarction: Secondary | ICD-10-CM | POA: Diagnosis not present

## 2016-05-20 DIAGNOSIS — M1990 Unspecified osteoarthritis, unspecified site: Secondary | ICD-10-CM | POA: Diagnosis not present

## 2016-05-20 DIAGNOSIS — Z79891 Long term (current) use of opiate analgesic: Secondary | ICD-10-CM | POA: Diagnosis not present

## 2016-05-20 HISTORY — PX: CYSTOSCOPY WITH RETROGRADE PYELOGRAM, URETEROSCOPY AND STENT PLACEMENT: SHX5789

## 2016-05-20 LAB — GLUCOSE, CAPILLARY
GLUCOSE-CAPILLARY: 86 mg/dL (ref 65–99)
Glucose-Capillary: 75 mg/dL (ref 65–99)

## 2016-05-20 SURGERY — CYSTOURETEROSCOPY, WITH RETROGRADE PYELOGRAM AND STENT INSERTION
Anesthesia: General | Site: Ureter | Laterality: Right

## 2016-05-20 MED ORDER — ONDANSETRON HCL 4 MG/2ML IJ SOLN
INTRAMUSCULAR | Status: AC
  Filled 2016-05-20: qty 2

## 2016-05-20 MED ORDER — OXYCODONE HCL 5 MG PO TABS: 5.0000 mg | ORAL_TABLET | ORAL | Status: DC | PRN

## 2016-05-20 MED ORDER — ONDANSETRON HCL 4 MG/2ML IJ SOLN
INTRAMUSCULAR | Status: DC | PRN
Start: 2016-05-20 — End: 2016-05-20
  Administered 2016-05-20: 4 mg via INTRAVENOUS

## 2016-05-20 MED ORDER — DEXAMETHASONE SODIUM PHOSPHATE 10 MG/ML IJ SOLN
INTRAMUSCULAR | Status: AC
  Filled 2016-05-20: qty 1

## 2016-05-20 MED ORDER — PROPOFOL 10 MG/ML IV BOLUS
INTRAVENOUS | Status: DC | PRN
  Administered 2016-05-20: 170 mg via INTRAVENOUS

## 2016-05-20 MED ORDER — PROPOFOL 10 MG/ML IV BOLUS
INTRAVENOUS | Status: AC
  Filled 2016-05-20: qty 20

## 2016-05-20 MED ORDER — SODIUM CHLORIDE 0.9% FLUSH
3.0000 mL | INTRAVENOUS | Status: DC | PRN
Start: 2016-05-20 — End: 2016-05-20

## 2016-05-20 MED ORDER — SODIUM CHLORIDE 0.9 % IV SOLN: 250.0000 mL | INTRAVENOUS | Status: DC | PRN

## 2016-05-20 MED ORDER — FENTANYL CITRATE (PF) 100 MCG/2ML IJ SOLN
INTRAMUSCULAR | Status: AC
  Filled 2016-05-20: qty 2

## 2016-05-20 MED ORDER — LIDOCAINE 2% (20 MG/ML) 5 ML SYRINGE
INTRAMUSCULAR | Status: DC | PRN
  Administered 2016-05-20: 60 mg via INTRAVENOUS

## 2016-05-20 MED ORDER — FENTANYL CITRATE (PF) 100 MCG/2ML IJ SOLN: 25.0000 ug | INTRAMUSCULAR | Status: DC | PRN

## 2016-05-20 MED ORDER — ACETAMINOPHEN 650 MG RE SUPP
650.0000 mg | RECTAL | Status: DC | PRN
  Filled 2016-05-20: qty 1

## 2016-05-20 MED ORDER — LIDOCAINE 2% (20 MG/ML) 5 ML SYRINGE
INTRAMUSCULAR | Status: AC
  Filled 2016-05-20: qty 20

## 2016-05-20 MED ORDER — SODIUM CHLORIDE 0.9 % IR SOLN
Status: DC | PRN
  Administered 2016-05-20: 4000 mL via INTRAVESICAL

## 2016-05-20 MED ORDER — SODIUM CHLORIDE 0.9% FLUSH: 3.0000 mL | Freq: Two times a day (BID) | INTRAVENOUS | Status: DC

## 2016-05-20 MED ORDER — DEXAMETHASONE SODIUM PHOSPHATE 10 MG/ML IJ SOLN
INTRAMUSCULAR | Status: DC | PRN
  Administered 2016-05-20: 10 mg via INTRAVENOUS

## 2016-05-20 MED ORDER — LACTATED RINGERS IV SOLN
INTRAVENOUS | Status: DC
  Administered 2016-05-20 (×2): via INTRAVENOUS

## 2016-05-20 MED ORDER — CEFAZOLIN SODIUM-DEXTROSE 2-4 GM/100ML-% IV SOLN
2.0000 g | INTRAVENOUS | Status: AC
  Administered 2016-05-20: 2 g via INTRAVENOUS
  Filled 2016-05-20: qty 100

## 2016-05-20 MED ORDER — FENTANYL CITRATE (PF) 100 MCG/2ML IJ SOLN
INTRAMUSCULAR | Status: DC | PRN
  Administered 2016-05-20 (×2): 50 ug via INTRAVENOUS

## 2016-05-20 MED ORDER — ACETAMINOPHEN 325 MG PO TABS: 650.0000 mg | ORAL_TABLET | ORAL | Status: DC | PRN

## 2016-05-20 SURGICAL SUPPLY — 26 items
BAG URO CATCHER STRL LF (MISCELLANEOUS) ×3 IMPLANT
BASKET STONE NCOMPASS (UROLOGICAL SUPPLIES) IMPLANT
BASKET ZERO TIP NITINOL 2.4FR (BASKET) ×2 IMPLANT
BSKT STON RTRVL ZERO TP 2.4FR (BASKET) ×1
CATH URET 5FR 28IN OPEN ENDED (CATHETERS) ×2 IMPLANT
CATH URET DUAL LUMEN 6-10FR 50 (CATHETERS) ×1 IMPLANT
CLOTH BEACON ORANGE TIMEOUT ST (SAFETY) ×3 IMPLANT
COVER SURGICAL LIGHT HANDLE (MISCELLANEOUS) ×1 IMPLANT
ELECT COAG BIPOLAR CYL 1.2MMM (ELECTROSURGICAL) ×3
ELECTRODE COAG BIPLR CYL 1.2MM (ELECTROSURGICAL) IMPLANT
EXTRACTOR STONE NITINOL NGAGE (UROLOGICAL SUPPLIES) ×1 IMPLANT
FIBER LASER FLEXIVA 1000 (UROLOGICAL SUPPLIES) IMPLANT
FIBER LASER FLEXIVA 365 (UROLOGICAL SUPPLIES) IMPLANT
FIBER LASER FLEXIVA 550 (UROLOGICAL SUPPLIES) IMPLANT
GLOVE SURG SS PI 8.0 STRL IVOR (GLOVE) ×4 IMPLANT
GOWN STRL REUS W/TWL XL LVL3 (GOWN DISPOSABLE) ×5 IMPLANT
GUIDEWIRE STR DUAL SENSOR (WIRE) ×3 IMPLANT
IV NS 1000ML (IV SOLUTION) ×3
IV NS 1000ML BAXH (IV SOLUTION) ×1 IMPLANT
IV NS IRRIG 3000ML ARTHROMATIC (IV SOLUTION) ×1 IMPLANT
MANIFOLD NEPTUNE II (INSTRUMENTS) ×3 IMPLANT
PACK CYSTO (CUSTOM PROCEDURE TRAY) ×3 IMPLANT
SHEATH ACCESS URETERAL 38CM (SHEATH) ×1 IMPLANT
SHEATH URET ACCESS 10/12FR (MISCELLANEOUS) IMPLANT
TUBING CONNECTING 10 (TUBING) ×2 IMPLANT
TUBING CONNECTING 10' (TUBING) ×1

## 2016-05-20 NOTE — Anesthesia Postprocedure Evaluation (Addendum)
Anesthesia Post Note  Patient: David Kelly  Procedure(s) Performed: Procedure(s) (LRB): CYSTOSCOPY, URETEROSCOPY, TRANSURETHRAL INCISION OF URETHROCELE, BASKET STONE REMOVAL (Right)  Patient location during evaluation: PACU Anesthesia Type: General Level of consciousness: awake and alert Pain management: pain level controlled Vital Signs Assessment: post-procedure vital signs reviewed and stable Respiratory status: spontaneous breathing, nonlabored ventilation, respiratory function stable and patient connected to nasal cannula oxygen Cardiovascular status: blood pressure returned to baseline and stable Postop Assessment: no signs of nausea or vomiting Anesthetic complications: no       Last Vitals:  Vitals:   05/20/16 1730 05/20/16 1743  BP: 129/68 (!) 150/69  Pulse: (!) 52 (!) 54  Resp: (!) 21 17  Temp: 36.6 C 36.5 C    Last Pain:  Vitals:   05/20/16 1426  TempSrc: Oral  PainSc: 4                  Effie Berkshire

## 2016-05-20 NOTE — Anesthesia Procedure Notes (Signed)
Procedure Name: LMA Insertion Date/Time: 05/20/2016 4:10 PM Performed by: Talbot Grumbling Pre-anesthesia Checklist: Patient identified, Emergency Drugs available, Suction available and Patient being monitored Patient Re-evaluated:Patient Re-evaluated prior to inductionOxygen Delivery Method: Circle system utilized Preoxygenation: Pre-oxygenation with 100% oxygen Intubation Type: IV induction Ventilation: Mask ventilation without difficulty LMA: LMA inserted LMA Size: 4.0 Number of attempts: 1 Placement Confirmation: positive ETCO2 and breath sounds checked- equal and bilateral Tube secured with: Tape Dental Injury: Teeth and Oropharynx as per pre-operative assessment

## 2016-05-20 NOTE — Op Note (Signed)
NAMEMING, MCMANNIS NO.:  [id number]  MEDICAL RECORD NO.:  [id number]  LOCATION:                                 FACILITY:  PHYSICIAN:  Marshall Cork. Jeffie Pollock, M.D.    DATE OF BIRTH:  September 30, 1945  DATE OF PROCEDURE:  05/20/2016 DATE OF DISCHARGE:                              OPERATIVE REPORT   PROCEDURE:  Cystoscopy with transurethral incision of right ureterocele and ureteroscopy with basket retrieval of a right ureteral stone.  PREOPERATIVE DIAGNOSIS:  Right distal ureteral stone.  POSTOPERATIVE DIAGNOSIS:  Right distal ureteral stone with simple ureterocele.  SURGEON:  Marshall Cork. Jeffie Pollock, M.D.  ANESTHESIA:  General.  SPECIMEN:  Stone.  DRAINS:  None.  BLOOD LOSS:  None.  SPECIMEN:  Stone.  COMPLICATIONS:  None.  INDICATION:  Mr. David Kelly is a 71 year old African American male, who presented today in followup from the ER visit, where he was found to have a 7 x 10 mm stone in the very right distal ureter, there was actually a suggestion of a stone within a ureterocele.  He was quite uncomfortable and elected to proceed with endoscopic therapy.  FINDINGS OF PROCEDURE:  He was taken to the operating room where general anesthetic was induced.  He was placed in lithotomy position.  His perineum and genitalia were prepped with Betadine solution and he was draped in the usual sterile fashion.  A cystoscopy was performed using the 23-French scope and 30-degree lens. Examination revealed a normal urethra.  The external sphincter was intact.  The prostatic urethra was patent from prior TURP with some left prostatic regrowth. Inspection of the bladder demonstrated a stone crowning.  A large stone crowning at the right ureteral orifice within a ureterocele with some surrounding erythema.  The left ureteral orifice was unremarkable.  The bladder wall had mild trabeculation with no tumors, stones, or inflammation.  An initial attempt to incise the ureterocele with  endoscopic scissors was unsuccessful and the stone actually popped back up into the ureter.  I then switched out to a 28-French continuous flow resectoscope sheath. This was fitted with an Beatrix Fetters handle with a [employer] and a 30- degree lens, saline was used as the irrigant and bipolar setting for the knife was used.  The ureterocele was then unroofed, but the stone had retropulsed and was not visible.  The resectoscope was then removed and a short 6.5 French semirigid ureteroscope was then passed up the ureter until the stone was visualized.  It was grasped with a Zero-Tip Nitinol basket, removed intact.  Once the stone was out, the cystoscope was reinserted and the bladder was drained.  The scope was removed.  The patient was taken down from the lithotomy position.  His anesthetic was reversed.  He was moved to recovery room in stable condition.  There were no complications.     Marshall Cork. Jeffie Pollock, M.D.   ______________________________ Marshall Cork. Jeffie Pollock, M.D.    JJW/MEDQ  D:  05/20/2016  T:  05/20/2016  Job:  [id number]

## 2016-05-20 NOTE — Transfer of Care (Signed)
Immediate Anesthesia Transfer of Care Note  Patient: David Kelly  Procedure(s) Performed: Procedure(s): CYSTOSCOPY, URETEROSCOPY, TRANSURETHRAL INCISION OF URETHROCELE, BASKET STONE REMOVAL (Right)  Patient Location: PACU  Anesthesia Type:General  Level of Consciousness:  sedated, patient cooperative and responds to stimulation  Airway & Oxygen Therapy:Patient Spontanous Breathing and Patient connected to face mask oxgen  Post-op Assessment:  Report given to PACU RN and Post -op Vital signs reviewed and stable  Post vital signs:  Reviewed and stable  Last Vitals:  Vitals:   05/20/16 1426  BP: 137/77  Pulse: 66  Resp: 20  Temp: 20.8 C    Complications: No apparent anesthesia complications

## 2016-05-20 NOTE — H&P (Signed)
CC: I have ureteral stone.  HPI: David Kelly is a 71 year-old male patient who was referred by Dr. Sharyon Cable, MD who is here for ureteral stone.  The problem is on the right side. He first stated noticing pain on approximately 05/13/2016. This is not his first kidney stone. He is currently having flank pain. He denies having back pain, groin pain, nausea, vomiting, fever, and chills. Pain is occuring on the right side. He has not caught a stone in his urine strainer since his symptoms began.   He has never had surgical treatment for calculi in the past.   Mr. David Kelly was seen in the ER on 4/3 for severe right flank pain that had been intermittent over a week and was associated with N/V. He has no hematuria but had some penile discomfort with urgency. he has had no prior stones. He had a 7x28m stone either in the bladder or at the right UVJ. He also has a small left renal stone. He had right hydro. He has no associated signs or symptoms or prior GU surgery.     ALLERGIES: None   MEDICATIONS: Metformin Hcl 1,000 mg tablet  Tamsulosin Hcl 0.4 mg capsule, ext release 24 hr  Aspirin Ec 81 mg tablet, delayed release  Combigan 0.2 %-0.5 % drops  Fluocinolone Acetonide  Hydrocodone-Acetaminophen 10 mg-325 mg tablet  Kenalog  Singulair 10 mg tablet     GU PSH: None     PSH Notes: colon surgery  gallbladder surgery     NON-GU PSH: None   GU PMH: None   NON-GU PMH: Arthritis COPD Coronary Artery Disease Diabetes Type 2 Glaucoma Myocardial Infarction    FAMILY HISTORY: Prostate Cancer - Grandfather   SOCIAL HISTORY: Marital Status: Single Current Smoking Status: Patient does not smoke anymore.   Tobacco Use Assessment Completed: Used Tobacco in last 30 days? Drinks 3 caffeinated drinks per day.     Notes: retired   REVIEW OF SYSTEMS:    GU Review Male:   Patient reports frequent urination, hard to postpone urination, burning/ pain with urination, get up at night  to urinate, leakage of urine, and stream starts and stops. Patient denies trouble starting your stream, have to strain to urinate , erection problems, and penile pain.  Gastrointestinal (Upper):   Patient denies nausea, vomiting, and indigestion/ heartburn.  Gastrointestinal (Lower):   Patient reports diarrhea. Patient denies constipation.  Constitutional:   Patient denies fever, night sweats, weight loss, and fatigue.  Skin:   Patient denies skin rash/ lesion and itching.  Eyes:   Patient denies blurred vision and double vision.  Ears/ Nose/ Throat:   Patient denies sore throat and sinus problems.  Hematologic/Lymphatic:   Patient denies swollen glands and easy bruising.  Cardiovascular:   Patient denies leg swelling and chest pains.  Respiratory:   Patient reports cough and shortness of breath.   Endocrine:   Patient denies excessive thirst.  Musculoskeletal:   Patient reports back pain. Patient denies joint pain.  Neurological:   Patient reports headaches and dizziness.   Psychologic:   Patient denies depression and anxiety.   VITAL SIGNS:      05/20/2016 08:49 AM  Weight 212 lb / 96.16 kg  Height 72 in / 182.88 cm  BP 122/76 mmHg  Pulse 66 /min  Temperature 97.1 F / 36 C  BMI 28.7 kg/m   GU PHYSICAL EXAMINATION:    Scrotum: No lesions. No edema. No cysts. No warts.  Epididymides:  Right: no spermatocele, no masses, no cysts, no tenderness, no induration, no enlargement. Left: no spermatocele, no masses, no cysts, no tenderness, no induration, no enlargement.  Testes: No tenderness, no swelling, no enlargement left testes. No tenderness, no swelling, no enlargement right testes. Normal location left testes. Normal location right testes. No mass, no cyst, no varicocele, no hydrocele left testes. No mass, no cyst, no varicocele, no hydrocele right testes.  Urethral Meatus: Normal size. No lesion, no wart, no discharge, no polyp. Normal location.  Penis: Penis uncircumcised. No foreskin  warts, no cracks. No dorsal peyronie's plaques, no left corporal peyronie's plaques, no right corporal peyronie's plaques, no scarring, no shaft warts. No balanitis, no meatal stenosis.    MULTI-SYSTEM PHYSICAL EXAMINATION:    Constitutional: Well-nourished. No physical deformities. Normally developed. Good grooming.  Neck: Neck symmetrical, not swollen. Normal tracheal position.  Respiratory: No labored breathing, no use of accessory muscles. CTA  Cardiovascular: Normal temperature, RRR without murmur.   Lymphatic: No enlargement of neck, axillae, groin.  Skin: No paleness, no jaundice, no cyanosis. No lesion, no ulcer, no rash.  Neurologic / Psychiatric: Oriented to time, oriented to place, oriented to person. No depression, no anxiety, no agitation.  Gastrointestinal: Abdominal tenderness RCVAT and RLQT, obese. No hernia. No mass, no rigidity.   Musculoskeletal: Normal gait and station of head and neck.     PAST DATA REVIEWED:  Source Of History:  Patient  Lab Test Review:   CBC with Diff, CMP  Records Review:   Previous Hospital Records  Urine Test Review:   Urinalysis  X-Ray Review: C.T. Stone Protocol: Reviewed Films. Reviewed Report. Discussed With Patient.    Notes:                     ER records and labs reviewed.    PROCEDURES:         KUB - [id number]  A single view of the abdomen is obtained. There appears to be a 6 x 66m stone at the right UVJ. He has a 4-567mLMP stone. He has some lumbar DDD. No gas or soft tissue abnormalities are noted.       Stone hasn't passed.          Urinalysis Dipstick Dipstick Cont'd  Color: Yellow Bilirubin: Neg  Appearance: Clear Ketones: Neg  Specific Gravity: 1.025 Blood: Neg  pH: <=5.0 Protein: Neg  Glucose: Neg Urobilinogen: 0.2    Nitrites: Neg    Leukocyte Esterase: Neg    ASSESSMENT:      ICD-10 Details  1 GU:   Calculus Ureter - N20.1 He has a large right UVJ stone and I reviewed the treatment options including MET, ureteroscopy  and ESWL. He wants to go ahead with ureteroscopy today. I reviewed the risks of bleeding, infection, ureteral injury, need for a stent on secondary procedures, thrombotic events and anesthetic complications.    PLAN:           Orders X-Rays: KUB          Schedule Return Visit/Planned Activity: ASAP - Schedule Surgery             Note: Ureteroscopy today.          Document Letter(s):  Created for Patient: Clinical Summary

## 2016-05-20 NOTE — Brief Op Note (Signed)
05/20/2016  4:47 PM  PATIENT:  David Kelly  71 y.o. male  PRE-OPERATIVE DIAGNOSIS:  right ueteral vesicle junction stone  POST-OPERATIVE DIAGNOSIS:  Same with simple right ureterocele  PROCEDURE:  Procedure(s): CYSTOSCOPY, URETEROSCOPY, TRANSURETHRAL INCISION OF URETHROCELE, BASKET STONE REMOVAL (Right)  SURGEON:  Surgeon(s) and Role:    * Irine Seal, MD - Primary  PHYSICIAN ASSISTANT:   ASSISTANTS: none   ANESTHESIA:   general  EBL:  Total I/O In: 1000 [I.V.:1000] Out: 0   BLOOD ADMINISTERED:none  DRAINS: none   LOCAL MEDICATIONS USED:  NONE  SPECIMEN:  Source of Specimen:  right UVJ stone  DISPOSITION OF SPECIMEN:  to patient to bring to office.  COUNTS:  YES  TOURNIQUET:  * No tourniquets in log *  DICTATION: .Other Dictation: Dictation Number [id number]  PLAN OF CARE: Discharge to home after PACU  PATIENT DISPOSITION:  PACU - hemodynamically stable.   Delay start of Pharmacological VTE agent (>24hrs) due to surgical blood loss or risk of bleeding: not applicable

## 2016-05-20 NOTE — Discharge Instructions (Addendum)
General Anesthesia, Adult, Care After These instructions provide you with information about caring for yourself after your procedure. Your health care provider may also give you more specific instructions. Your treatment has been planned according to current medical practices, but problems sometimes occur. Call your health care provider if you have any problems or questions after your procedure. What can I expect after the procedure? After the procedure, it is common to have:  Vomiting.  A sore throat.  Mental slowness. It is common to feel:  Nauseous.  Cold or shivery.  Sleepy.  Tired.  Sore or achy, even in parts of your body where you did not have surgery. Follow these instructions at home: For at least 24 hours after the procedure:   Do not:  Participate in activities where you could fall or become injured.  Drive.  Use heavy machinery.  Drink alcohol.  Take sleeping pills or medicines that cause drowsiness.  Make important decisions or sign legal documents.  Take care of children on your own.  Rest. Eating and drinking   If you vomit, drink water, juice, or soup when you can drink without vomiting.  Drink enough fluid to keep your urine clear or pale yellow.  Make sure you have little or no nausea before eating solid foods.  Follow the diet recommended by your health care provider. General instructions   Have a responsible adult stay with you until you are awake and alert.  Return to your normal activities as told by your health care provider. Ask your health care provider what activities are safe for you.  Take over-the-counter and prescription medicines only as told by your health care provider.  If you smoke, do not smoke without supervision.  Keep all follow-up visits as told by your health care provider. This is important. Contact a health care provider if:  You continue to have nausea or vomiting at home, and medicines are not helpful.  You  cannot drink fluids or start eating again.  You cannot urinate after 8-12 hours.  You develop a skin rash.  You have fever.  You have increasing redness at the site of your procedure. Get help right away if:  You have difficulty breathing.  You have chest pain.  You have unexpected bleeding.  You feel that you are having a life-threatening or urgent problem. This information is not intended to replace advice given to you by your health care provider. Make sure you discuss any questions you have with your health care provider. Document Released: 05/09/2000 Document Revised: 07/06/2015 Document Reviewed: 01/15/2015 Elsevier Interactive Patient Education  2017 [employer]     Ureteroscopy, Care After This sheet gives you information about how to care for yourself after your procedure. Your health care provider may also give you more specific instructions. If you have problems or questions, contact your health care provider. What can I expect after the procedure? After the procedure, it is common to have:  A burning sensation when you urinate.  Blood in your urine.  Mild discomfort in the bladder area or kidney area when urinating.  Needing to urinate more often or urgently. Follow these instructions at home: Medicines   Take over-the-counter and prescription medicines only as told by your health care provider.  If you were prescribed an antibiotic medicine, take it as told by your health care provider. Do not stop taking the antibiotic even if you start to feel better. General instructions    Donot drive for 24 hours if you  were given a medicine to help you relax (sedative) during your procedure.  To relieve burning, try taking a warm bath or holding a warm washcloth over your groin.  Drink enough fluid to keep your urine clear or pale yellow.  Drink two 8-ounce glasses of water every hour for the first 2 hours after you get home.  Continue to drink water often  at home.  You can eat what you usually do.  Keep all follow-up visits as told by your health care provider. This is important.  If you had a tube placed to keep urine flowing (ureteral stent), ask your health care provider when you need to return to have it removed. Contact a health care provider if:  You have chills or a fever.  You have burning pain for longer than 24 hours after the procedure.  You have blood in your urine for longer than 24 hours after the procedure. Get help right away if:  You have large amounts of blood in your urine.  You have blood clots in your urine.  You have very bad pain.  You have chest pain or trouble breathing.  You are unable to urinate and you have the feeling of a full bladder.  Bring your stone to the office.   This information is not intended to replace advice given to you by your health care provider. Make sure you discuss any questions you have with your health care provider. Document Released: 02/05/2013 Document Revised: 11/17/2015 Document Reviewed: 11/13/2015 Elsevier Interactive Patient Education  2017 [employer].

## 2016-05-20 NOTE — Op Note (Deleted)
  The note originally documented on this encounter has been moved the the encounter in which it belongs.  

## 2016-05-20 NOTE — Anesthesia Preprocedure Evaluation (Addendum)
Anesthesia Evaluation  Patient identified by MRN, date of birth, ID band Patient awake    Reviewed: Allergy & Precautions, NPO status , Patient's Chart, lab work & pertinent test results  Airway Mallampati: I  TM Distance: >3 FB     Dental  (+) Partial Lower, Partial Upper, Missing, Dental Advisory Given   Pulmonary former smoker,    breath sounds clear to auscultation       Cardiovascular negative cardio ROS   Rhythm:Regular Rate:Normal     Neuro/Psych negative neurological ROS  negative psych ROS   GI/Hepatic negative GI ROS, Neg liver ROS,   Endo/Other  diabetes, Type 2, Oral Hypoglycemic Agents  Renal/GU Renal disease  negative genitourinary   Musculoskeletal  (+) Arthritis ,   Abdominal   Peds negative pediatric ROS (+)  Hematology negative hematology ROS (+)   Anesthesia Other Findings   Reproductive/Obstetrics negative OB ROS                            Lab Results  Component Value Date   WBC 7.9 05/17/2016   HGB 12.1 (L) 05/17/2016   HCT 38.2 (L) 05/17/2016   MCV 84.3 05/17/2016   PLT 278 05/17/2016   Lab Results  Component Value Date   CREATININE 0.79 05/17/2016   BUN 13 05/17/2016   NA 142 05/17/2016   K 4.1 05/17/2016   CL 107 05/17/2016   CO2 22 05/17/2016   Lab Results  Component Value Date   INR 1.06 03/22/2011   EKG: sinus tachycardia.  Anesthesia Physical Anesthesia Plan  ASA: II  Anesthesia Plan: General   Post-op Pain Management:    Induction: Intravenous  Airway Management Planned: LMA  Additional Equipment:   Intra-op Plan:   Post-operative Plan: Extubation in OR  Informed Consent: I have reviewed the patients History and Physical, chart, labs and discussed the procedure including the risks, benefits and alternatives for the proposed anesthesia with the patient or authorized representative who has indicated his/her understanding and  acceptance.   Dental advisory given  Plan Discussed with: CRNA  Anesthesia Plan Comments:         Anesthesia Quick Evaluation

## 2016-05-23 ENCOUNTER — Encounter (HOSPITAL_COMMUNITY): Payer: Self-pay | Admitting: Urology

## 2016-06-03 ENCOUNTER — Telehealth: Payer: Self-pay | Admitting: Family

## 2016-06-03 DIAGNOSIS — M150 Primary generalized (osteo)arthritis: Principal | ICD-10-CM

## 2016-06-03 DIAGNOSIS — N201 Calculus of ureter: Secondary | ICD-10-CM | POA: Diagnosis not present

## 2016-06-03 DIAGNOSIS — M159 Polyosteoarthritis, unspecified: Secondary | ICD-10-CM

## 2016-06-03 MED ORDER — HYDROCODONE-ACETAMINOPHEN 10-325 MG PO TABS: 1.0000 | ORAL_TABLET | Freq: Three times a day (TID) | ORAL | 0 refills | Status: DC | PRN

## 2016-06-03 NOTE — Telephone Encounter (Signed)
Pt would like a refill of HYDROcodone-acetaminophen (NORCO) 10-325 MG tablet   [facility] on [address] in [place]

## 2016-06-03 NOTE — Telephone Encounter (Signed)
Medication available for pick up. 

## 2016-06-03 NOTE — Telephone Encounter (Signed)
Patient is aware 

## 2016-06-07 DIAGNOSIS — N201 Calculus of ureter: Secondary | ICD-10-CM | POA: Diagnosis not present

## 2016-06-17 DIAGNOSIS — M1990 Unspecified osteoarthritis, unspecified site: Secondary | ICD-10-CM | POA: Diagnosis not present

## 2016-06-17 DIAGNOSIS — K648 Other hemorrhoids: Secondary | ICD-10-CM | POA: Diagnosis not present

## 2016-06-17 DIAGNOSIS — E7800 Pure hypercholesterolemia, unspecified: Secondary | ICD-10-CM | POA: Diagnosis not present

## 2016-06-17 DIAGNOSIS — G8929 Other chronic pain: Secondary | ICD-10-CM | POA: Diagnosis not present

## 2016-06-17 DIAGNOSIS — Z79899 Other long term (current) drug therapy: Secondary | ICD-10-CM | POA: Diagnosis not present

## 2016-06-17 DIAGNOSIS — E119 Type 2 diabetes mellitus without complications: Secondary | ICD-10-CM | POA: Diagnosis not present

## 2016-06-17 DIAGNOSIS — I10 Essential (primary) hypertension: Secondary | ICD-10-CM | POA: Diagnosis not present

## 2016-06-17 DIAGNOSIS — D62 Acute posthemorrhagic anemia: Secondary | ICD-10-CM | POA: Diagnosis not present

## 2016-06-17 DIAGNOSIS — I2510 Atherosclerotic heart disease of native coronary artery without angina pectoris: Secondary | ICD-10-CM | POA: Diagnosis not present

## 2016-06-17 DIAGNOSIS — I252 Old myocardial infarction: Secondary | ICD-10-CM | POA: Diagnosis not present

## 2016-06-17 DIAGNOSIS — E11630 Type 2 diabetes mellitus with periodontal disease: Secondary | ICD-10-CM | POA: Diagnosis not present

## 2016-06-17 DIAGNOSIS — Z7982 Long term (current) use of aspirin: Secondary | ICD-10-CM | POA: Diagnosis not present

## 2016-06-17 DIAGNOSIS — Z87891 Personal history of nicotine dependence: Secondary | ICD-10-CM | POA: Diagnosis not present

## 2016-06-17 DIAGNOSIS — R55 Syncope and collapse: Secondary | ICD-10-CM | POA: Diagnosis not present

## 2016-06-17 DIAGNOSIS — K922 Gastrointestinal hemorrhage, unspecified: Secondary | ICD-10-CM | POA: Diagnosis not present

## 2016-06-17 DIAGNOSIS — K5731 Diverticulosis of large intestine without perforation or abscess with bleeding: Secondary | ICD-10-CM | POA: Diagnosis not present

## 2016-06-17 DIAGNOSIS — K625 Hemorrhage of anus and rectum: Secondary | ICD-10-CM | POA: Diagnosis not present

## 2016-06-24 ENCOUNTER — Ambulatory Visit: Payer: Medicare Other | Admitting: Sports Medicine

## 2016-06-28 ENCOUNTER — Other Ambulatory Visit (INDEPENDENT_AMBULATORY_CARE_PROVIDER_SITE_OTHER): Payer: Medicare Other

## 2016-06-28 ENCOUNTER — Ambulatory Visit (INDEPENDENT_AMBULATORY_CARE_PROVIDER_SITE_OTHER): Payer: Medicare Other | Admitting: Family

## 2016-06-28 ENCOUNTER — Encounter: Payer: Self-pay | Admitting: Family

## 2016-06-28 DIAGNOSIS — K922 Gastrointestinal hemorrhage, unspecified: Secondary | ICD-10-CM | POA: Insufficient documentation

## 2016-06-28 LAB — CBC
HEMATOCRIT: 34.1 % — AB (ref 39.0–52.0)
HEMOGLOBIN: 11.1 g/dL — AB (ref 13.0–17.0)
MCHC: 32.6 g/dL (ref 30.0–36.0)
MCV: 86.4 fl (ref 78.0–100.0)
PLATELETS: 370 10*3/uL (ref 150.0–400.0)
RBC: 3.95 Mil/uL — AB (ref 4.22–5.81)
RDW: 15.7 % — ABNORMAL HIGH (ref 11.5–15.5)
WBC: 8.6 10*3/uL (ref 4.0–10.5)

## 2016-06-28 LAB — IBC PANEL
Iron: 182 ug/dL — ABNORMAL HIGH (ref 42–165)
Saturation Ratios: 46.6 % (ref 20.0–50.0)
Transferrin: 279 mg/dL (ref 212.0–360.0)

## 2016-06-28 NOTE — Progress Notes (Signed)
Subjective:    Patient ID: David Kelly, male    DOB: Jun 20, 1945, 71 y.o.   MRN: [id number]  Chief Complaint  Patient presents with  . Hospitalization Follow-up    HPI:  David Kelly is a 71 y.o. male who  has a past medical history of Arthritis; BPH (benign prostatic hypertrophy); Diabetes mellitus; Glaucoma; and Kidney stones. and presents today for a hospitalization follow up.    This is a new problem. Recently evaluated in the emergency department and admitted to the hospital with concern for gastrointestinal bleeding following the chief complaint of a bowel movement that had off-color and concerning for bright red blood. Noted previous history of diverticular bleed. In the emergency per patient had an additional 2 large bowel movements that were changed and red in color. Severity with an event of possible syncope. Blood pressure remained stable with his initial hemoglobin 11.9, given syncopal episode he was given 2 units of pack red blood cells. Labs were unremarkable. Gastroenterology was consult and evaluate patient with instructions to transfuse as needed and provide clear liquid diet. During hospitalization his hemoglobin remained stable with no active worsening bleeding. Gastroenterology recommended medical management with no indication for colonoscopy. His hemoglobin remained stable with instructions follow-up with gastroenterology 1-2 weeks. He was started on iron supplementation and shorted to hold aspirin for at least 1 week. Hospitalist recommended H&H in one week with primary care. All hospital records, labs, and imaging reviewed in detail.  Since leaving the hospital he reports no further symptoms of gastrointestinal bleed, abdominal pain, or syncope. Bowel movements have returned to normal. He has remained off of the aspirin since being discharged from the hospital. Reports taking the iron as prescribed and denies adverse side effects or constipation. Eating and drinking  well with no nausea, vomiting, constipation, or diarrhea. Indicates he occasionally used ibuprofen or Aleve.      No Known Allergies    Outpatient Medications Prior to Visit  Medication Sig Dispense Refill  . aspirin EC 81 MG tablet Take 81 mg by mouth at bedtime.    . brimonidine-timolol (COMBIGAN) 0.2-0.5 % ophthalmic solution Place 1 drop into both eyes every 12 (twelve) hours. 15 mL 1  . Fluocinolone Acetonide 0.01 % OIL Place 5 drops in ear(s) 2 (two) times daily. 20 mL 1  . HYDROcodone-acetaminophen (NORCO) 10-325 MG tablet Take 1 tablet by mouth every 8 (eight) hours as needed. 90 tablet 0  . metFORMIN (GLUCOPHAGE) 1000 MG tablet Take 1 tablet (1,000 mg total) by mouth 2 (two) times daily with a meal. 180 tablet 0  . montelukast (SINGULAIR) 10 MG tablet Take 1 tablet (10 mg total) by mouth at bedtime. 90 tablet 1  . Multiple Vitamin (MULTIVITAMIN WITH MINERALS) TABS tablet Take 1 tablet by mouth daily.    . tamsulosin (FLOMAX) 0.4 MG CAPS capsule Take 0.4 mg by mouth at bedtime.     . triamcinolone (KENALOG) 0.025 % cream Apply 1 application topically 2 (two) times daily. (Patient taking differently: Apply 1 application topically 2 (two) times daily as needed (for itching/irritation). ) 30 g 1   No facility-administered medications prior to visit.       Past Surgical History:  Procedure Laterality Date  . CYSTOSCOPY WITH RETROGRADE PYELOGRAM, URETEROSCOPY AND STENT PLACEMENT Right 05/20/2016   Procedure: CYSTOSCOPY, URETEROSCOPY, TRANSURETHRAL INCISION OF URETHROCELE, BASKET STONE REMOVAL;  Surgeon: David Seal, MD;  Location: [facility];  Service: Urology;  Laterality: Right;  . left total knee replacement    .  TOTAL KNEE ARTHROPLASTY  04/04/2011   Procedure: TOTAL KNEE ARTHROPLASTY;  Surgeon: David Haskell, MD;  Location: [facility];  Service: Orthopedics;  Laterality: Left;      Past Medical History:  Diagnosis Date  . Arthritis   . BPH (benign prostatic hypertrophy)   .  Diabetes mellitus    DX  5 YR AGO  . Glaucoma   . Kidney stones       Review of Systems  Constitutional: Negative for chills and fever.  Respiratory: Negative for chest tightness and shortness of breath.   Cardiovascular: Negative for chest pain, palpitations and leg swelling.  Gastrointestinal: Negative for abdominal distention, abdominal pain, anal bleeding, blood in stool, constipation, diarrhea, nausea, rectal pain and vomiting.  Neurological: Negative for dizziness, syncope and weakness.      Objective:    BP (!) 142/70 (BP Location: Left Arm, Patient Position: Sitting, Cuff Size: Large)   Pulse 80   Temp 98.2 F (36.8 C) (Oral)   Resp 16   Ht 6' (1.829 m)   Wt 207 lb (93.9 kg)   SpO2 98%   BMI 28.07 kg/m  Nursing note and vital signs reviewed.  Physical Exam  Constitutional: He is oriented to person, place, and time. He appears well-developed and well-nourished. No distress.  Cardiovascular: Normal rate, regular rhythm, normal heart sounds and intact distal pulses.   Pulmonary/Chest: Effort normal and breath sounds normal.  Abdominal: Normal appearance and bowel sounds are normal. He exhibits no mass. There is no hepatosplenomegaly. There is no tenderness. There is no rigidity, no rebound, no guarding, no tenderness at McBurney's point and negative Murphy's sign.  Neurological: He is alert and oriented to person, place, and time.  Skin: Skin is warm and dry.  Psychiatric: He has a normal mood and affect. His behavior is normal. Judgment and thought content normal.       Assessment & Plan:   Problem List Items Addressed This Visit      Digestive   Gastrointestinal bleed    New onset gastrointestinal bleed with undetermined cause with no further symptoms since leaving the hospital. Abdominal exam is benign and vital signs are stable. Obtain CBC and IBC panel. Continue current dosage of ferrous sulfate pending blood work results. Continue to monitor.        Relevant Orders   IBC panel (Completed)   CBC (Completed)       I am having Mr. David Kelly maintain his tamsulosin, triamcinolone, metFORMIN, brimonidine-timolol, Fluocinolone Acetonide, montelukast, aspirin EC, multivitamin with minerals, and HYDROcodone-acetaminophen.   Follow-up: Return in about 2 months (around 08/28/2016), or if symptoms worsen or fail to improve.  David Po, FNP

## 2016-06-28 NOTE — Patient Instructions (Signed)
Thank you for choosing [facility].  SUMMARY AND INSTRUCTIONS:  Please discontinue aspirin.   Continue to take the ferrous sulfate.   Follow up if symptoms return.   Medication:  Your prescription(s) have been submitted to your pharmacy or been printed and provided for you. Please take as directed and contact our office if you believe you are having problem(s) with the medication(s) or have any questions.  Follow up:  If your symptoms worsen or fail to improve, please contact our office for further instruction, or in case of emergency go directly to the emergency room at the closest medical facility.

## 2016-06-28 NOTE — Assessment & Plan Note (Signed)
New onset gastrointestinal bleed with undetermined cause with no further symptoms since leaving the hospital. Abdominal exam is benign and vital signs are stable. Obtain CBC and IBC panel. Continue current dosage of ferrous sulfate pending blood work results. Continue to monitor.

## 2016-06-29 DIAGNOSIS — D649 Anemia, unspecified: Secondary | ICD-10-CM | POA: Diagnosis not present

## 2016-07-04 ENCOUNTER — Other Ambulatory Visit: Payer: Self-pay | Admitting: *Deleted

## 2016-07-04 DIAGNOSIS — M159 Polyosteoarthritis, unspecified: Secondary | ICD-10-CM

## 2016-07-04 DIAGNOSIS — L299 Pruritus, unspecified: Secondary | ICD-10-CM

## 2016-07-04 DIAGNOSIS — E119 Type 2 diabetes mellitus without complications: Secondary | ICD-10-CM

## 2016-07-04 DIAGNOSIS — M150 Primary generalized (osteo)arthritis: Secondary | ICD-10-CM

## 2016-07-04 MED ORDER — HYDROCODONE-ACETAMINOPHEN 10-325 MG PO TABS: 1.0000 | ORAL_TABLET | Freq: Three times a day (TID) | ORAL | 0 refills | Status: DC | PRN

## 2016-07-04 MED ORDER — METFORMIN HCL 1000 MG PO TABS: 1000.0000 mg | ORAL_TABLET | Freq: Two times a day (BID) | ORAL | 0 refills | Status: DC

## 2016-07-04 MED ORDER — FLUOCINOLONE ACETONIDE 0.01 % OT OIL: 5.0000 [drp] | TOPICAL_OIL | Freq: Two times a day (BID) | OTIC | 1 refills | Status: DC

## 2016-07-04 MED ORDER — TRIAMCINOLONE ACETONIDE 0.025 % EX CREA: 1.0000 "application " | TOPICAL_CREAM | Freq: Two times a day (BID) | CUTANEOUS | 0 refills | Status: DC | PRN

## 2016-07-04 NOTE — Telephone Encounter (Signed)
Medication refilled and available for pick up. 

## 2016-07-04 NOTE — Telephone Encounter (Signed)
Rec'd call pt requesting refill on his hydrocodone, metformin, kenalog cream, and ear drops. Sent maintenance meds pls advise on pain med & ear drops...Johny Chess

## 2016-07-04 NOTE — Telephone Encounter (Signed)
Called pt no answer [facility] rx's have been filled but will need to pick-up pain med rx at the office....David Kelly

## 2016-07-04 NOTE — Addendum Note (Signed)
Addended by: Earnstine Regal on: 07/04/2016 01:53 PM   Modules accepted: Orders

## 2016-07-06 ENCOUNTER — Ambulatory Visit: Payer: Medicare Other | Admitting: Sports Medicine

## 2016-07-06 DIAGNOSIS — K921 Melena: Secondary | ICD-10-CM | POA: Diagnosis not present

## 2016-07-15 NOTE — Addendum Note (Signed)
Addendum  created 07/15/16 1212 by Effie Berkshire, MD   Sign clinical note

## 2016-07-22 ENCOUNTER — Telehealth: Payer: Self-pay | Admitting: Family

## 2016-07-22 NOTE — Telephone Encounter (Signed)
Pt walked in and would like a Rx written prescription for his Accu check test strips, he has been paying for them out of pocket at [facility], but he cannot afford them anymore and would like a prescription written to [facility]. He only has a few days left of his strips. Please advise.

## 2016-07-25 MED ORDER — GLUCOSE BLOOD VI STRP: ORAL_STRIP | 12 refills | Status: DC

## 2016-07-25 NOTE — Telephone Encounter (Signed)
Strips have been sent to requested pharmacy.

## 2016-07-27 DIAGNOSIS — N202 Calculus of kidney with calculus of ureter: Secondary | ICD-10-CM | POA: Diagnosis not present

## 2016-07-28 DIAGNOSIS — N201 Calculus of ureter: Secondary | ICD-10-CM | POA: Diagnosis not present

## 2016-08-03 DIAGNOSIS — D649 Anemia, unspecified: Secondary | ICD-10-CM | POA: Diagnosis not present

## 2016-08-04 ENCOUNTER — Telehealth: Payer: Self-pay | Admitting: Family

## 2016-08-04 DIAGNOSIS — M150 Primary generalized (osteo)arthritis: Principal | ICD-10-CM

## 2016-08-04 DIAGNOSIS — M159 Polyosteoarthritis, unspecified: Secondary | ICD-10-CM

## 2016-08-04 MED ORDER — HYDROCODONE-ACETAMINOPHEN 10-325 MG PO TABS: 1.0000 | ORAL_TABLET | Freq: Three times a day (TID) | ORAL | 0 refills | Status: DC | PRN

## 2016-08-04 NOTE — Telephone Encounter (Signed)
Medication refilled

## 2016-08-04 NOTE — Telephone Encounter (Signed)
Pt would like a refill of HYDROcodone-acetaminophen (NORCO) 10-325 MG tablet

## 2016-08-05 NOTE — Telephone Encounter (Signed)
Called pt no answer [facility] rx ready for pick-up.../lmb 

## 2016-09-02 ENCOUNTER — Telehealth: Payer: Self-pay | Admitting: Family

## 2016-09-02 DIAGNOSIS — M150 Primary generalized (osteo)arthritis: Principal | ICD-10-CM

## 2016-09-02 DIAGNOSIS — M159 Polyosteoarthritis, unspecified: Secondary | ICD-10-CM

## 2016-09-02 MED ORDER — FLUOCINOLONE ACETONIDE 0.01 % OT OIL: 5.0000 [drp] | TOPICAL_OIL | Freq: Two times a day (BID) | OTIC | 1 refills | Status: DC

## 2016-09-02 MED ORDER — HYDROCODONE-ACETAMINOPHEN 10-325 MG PO TABS: 1.0000 | ORAL_TABLET | Freq: Three times a day (TID) | ORAL | 0 refills | Status: DC | PRN

## 2016-09-02 NOTE — Telephone Encounter (Signed)
Pt would like a refill of his  HYDROcodone-acetaminophen (NORCO) 10-325 MG tablet And  Fluocinolone Acetonide 0.01 % OIL  [facility] in [place]   Please advise

## 2016-09-02 NOTE — Telephone Encounter (Signed)
Last refill was 6/22 per [facility] other med sent.

## 2016-09-02 NOTE — Telephone Encounter (Signed)
Medication refilled and available for pick up. 

## 2016-09-05 NOTE — Telephone Encounter (Signed)
Pt notified its ready to be picked up

## 2016-09-05 NOTE — Telephone Encounter (Signed)
Tried calling pt line keep being busy x's 2 calls will try back later...David Kelly

## 2016-09-07 DIAGNOSIS — D649 Anemia, unspecified: Secondary | ICD-10-CM | POA: Diagnosis not present

## 2016-09-08 ENCOUNTER — Telehealth: Payer: Self-pay | Admitting: Emergency Medicine

## 2016-09-08 NOTE — Telephone Encounter (Signed)
Attempted PA for DermOtic 0.01% oil. Insurance will not cover this med. Please advise on alternative.

## 2016-09-13 NOTE — Telephone Encounter (Signed)
There are no other alternatives other than over the counter ear drops or sweet oil

## 2016-09-14 NOTE — Telephone Encounter (Signed)
LVM informing pt of message below.

## 2016-09-21 DIAGNOSIS — K921 Melena: Secondary | ICD-10-CM | POA: Diagnosis not present

## 2016-10-05 ENCOUNTER — Telehealth: Payer: Self-pay | Admitting: Family

## 2016-10-05 DIAGNOSIS — M150 Primary generalized (osteo)arthritis: Principal | ICD-10-CM

## 2016-10-05 DIAGNOSIS — M159 Polyosteoarthritis, unspecified: Secondary | ICD-10-CM

## 2016-10-05 MED ORDER — FLUOCINOLONE ACETONIDE 0.01 % OT OIL: 5.0000 [drp] | TOPICAL_OIL | Freq: Two times a day (BID) | OTIC | 1 refills | Status: DC

## 2016-10-05 MED ORDER — HYDROCODONE-ACETAMINOPHEN 10-325 MG PO TABS
1.0000 | ORAL_TABLET | Freq: Three times a day (TID) | ORAL | 0 refills | Status: DC | PRN
Start: 2016-10-05 — End: 2016-11-07

## 2016-10-05 NOTE — Telephone Encounter (Signed)
Check [place] registry last filled 09/05/2016...David Kelly

## 2016-10-05 NOTE — Telephone Encounter (Signed)
Medication printed to be picked up. 

## 2016-10-05 NOTE — Telephone Encounter (Signed)
Patient requesting script for hydrocodone and fluocinolone acetonide.   Patient uses [facility] in [place].

## 2016-10-05 NOTE — Telephone Encounter (Signed)
Tried calling pt x's 2 phone ring once sounds like someone is picking up but no saying anything. If pt call back rx is ready for pick-up.Marland KitchenJohny Kelly

## 2016-10-07 ENCOUNTER — Telehealth: Payer: Self-pay | Admitting: Family

## 2016-10-07 MED ORDER — TRIAMCINOLONE ACETONIDE 0.025 % EX OINT
1.0000 | TOPICAL_OINTMENT | Freq: Two times a day (BID) | CUTANEOUS | 0 refills | Status: DC | PRN
Start: 2016-10-07 — End: 2017-01-12

## 2016-10-07 NOTE — Telephone Encounter (Signed)
Pharmacy called stating the Pt needs a refill of triamcinolone (KENALOG) 0.025 % cream But instead of cream he would like topical ointment  Please advise Last seen 06/2016

## 2016-10-07 NOTE — Telephone Encounter (Signed)
Change cream to ointment sent updated script...Johny Chess

## 2016-10-25 DIAGNOSIS — S99922A Unspecified injury of left foot, initial encounter: Secondary | ICD-10-CM | POA: Diagnosis not present

## 2016-10-25 DIAGNOSIS — R05 Cough: Secondary | ICD-10-CM | POA: Diagnosis not present

## 2016-10-31 ENCOUNTER — Telehealth: Payer: Self-pay | Admitting: Family

## 2016-10-31 ENCOUNTER — Other Ambulatory Visit: Payer: Self-pay | Admitting: Family

## 2016-10-31 DIAGNOSIS — E119 Type 2 diabetes mellitus without complications: Secondary | ICD-10-CM

## 2016-10-31 MED ORDER — METFORMIN HCL 1000 MG PO TABS: 1000.0000 mg | ORAL_TABLET | Freq: Two times a day (BID) | ORAL | 0 refills | Status: DC

## 2016-10-31 NOTE — Telephone Encounter (Signed)
Pt is due for f/u appt send 30 day script to local pharmacy until appt...David Kelly

## 2016-10-31 NOTE — Telephone Encounter (Signed)
Pt called in and needs a refill sent in for  metFORMIN (GLUCOPHAGE) 1000 MG tablet [[id number]]    Pharmacy on file

## 2016-11-04 ENCOUNTER — Telehealth: Payer: Self-pay | Admitting: Family

## 2016-11-04 DIAGNOSIS — M159 Polyosteoarthritis, unspecified: Secondary | ICD-10-CM

## 2016-11-04 DIAGNOSIS — M150 Primary generalized (osteo)arthritis: Principal | ICD-10-CM

## 2016-11-04 NOTE — Telephone Encounter (Signed)
Pt need refills on  brimonidine-timolol (COMBIGAN) 0.2-0.5 % ophthalmic solution [[id number]]   Hydrocodone   Metformin   Pharmacy [facility]

## 2016-11-05 NOTE — Telephone Encounter (Signed)
Relation to PQ:DIYM Call back [place]: [place], [place] [phone] (Phone) [phone] (Fax)     Reason for call:  Patient walked in, checking on the status of medication mentioned below, informed patient regarding Hydrocodone office requires 48 hours notice and PCP will follow up on Monday, please advise

## 2016-11-07 MED ORDER — BRIMONIDINE TARTRATE-TIMOLOL 0.2-0.5 % OP SOLN: 1.0000 [drp] | Freq: Two times a day (BID) | OPHTHALMIC | 1 refills | Status: DC

## 2016-11-07 MED ORDER — HYDROCODONE-ACETAMINOPHEN 10-325 MG PO TABS: 1.0000 | ORAL_TABLET | Freq: Three times a day (TID) | ORAL | 0 refills | Status: DC | PRN

## 2016-11-07 NOTE — Telephone Encounter (Signed)
Medications refilled

## 2016-11-07 NOTE — Telephone Encounter (Signed)
Notified pt rx ready for pick-up.../lmb 

## 2016-11-18 DIAGNOSIS — D649 Anemia, unspecified: Secondary | ICD-10-CM | POA: Diagnosis not present

## 2016-11-28 ENCOUNTER — Telehealth: Payer: Self-pay | Admitting: Family

## 2016-11-28 DIAGNOSIS — E119 Type 2 diabetes mellitus without complications: Secondary | ICD-10-CM

## 2016-11-29 MED ORDER — METFORMIN HCL 1000 MG PO TABS: 1000.0000 mg | ORAL_TABLET | Freq: Two times a day (BID) | ORAL | 0 refills | Status: DC

## 2016-11-29 NOTE — Addendum Note (Signed)
Addended by: Earnstine Regal on: 11/29/2016 04:31 PM   Modules accepted: Orders

## 2016-11-29 NOTE — Telephone Encounter (Signed)
Patient has scheduled appt with Dr. [name] for end of month.  Patient will be out of this medication tomorrow.  Can we please fill until the appt date?  Please follow up with patient in regard at [phone].

## 2016-12-12 DIAGNOSIS — K921 Melena: Secondary | ICD-10-CM | POA: Diagnosis not present

## 2016-12-13 ENCOUNTER — Encounter: Payer: Self-pay | Admitting: Internal Medicine

## 2016-12-13 ENCOUNTER — Ambulatory Visit (INDEPENDENT_AMBULATORY_CARE_PROVIDER_SITE_OTHER): Payer: Medicare Other | Admitting: Internal Medicine

## 2016-12-13 ENCOUNTER — Other Ambulatory Visit (INDEPENDENT_AMBULATORY_CARE_PROVIDER_SITE_OTHER): Payer: Medicare Other

## 2016-12-13 VITALS — BP 100/70 | HR 74 | Temp 98.2°F | Resp 18 | Wt 209.4 lb

## 2016-12-13 DIAGNOSIS — G8929 Other chronic pain: Secondary | ICD-10-CM | POA: Insufficient documentation

## 2016-12-13 DIAGNOSIS — E119 Type 2 diabetes mellitus without complications: Secondary | ICD-10-CM | POA: Diagnosis not present

## 2016-12-13 DIAGNOSIS — R296 Repeated falls: Secondary | ICD-10-CM | POA: Insufficient documentation

## 2016-12-13 DIAGNOSIS — H409 Unspecified glaucoma: Secondary | ICD-10-CM | POA: Insufficient documentation

## 2016-12-13 DIAGNOSIS — Z0000 Encounter for general adult medical examination without abnormal findings: Secondary | ICD-10-CM

## 2016-12-13 DIAGNOSIS — Z1159 Encounter for screening for other viral diseases: Secondary | ICD-10-CM

## 2016-12-13 DIAGNOSIS — M150 Primary generalized (osteo)arthritis: Secondary | ICD-10-CM

## 2016-12-13 DIAGNOSIS — N200 Calculus of kidney: Secondary | ICD-10-CM | POA: Insufficient documentation

## 2016-12-13 DIAGNOSIS — M159 Polyosteoarthritis, unspecified: Secondary | ICD-10-CM

## 2016-12-13 DIAGNOSIS — Z9189 Other specified personal risk factors, not elsewhere classified: Secondary | ICD-10-CM | POA: Diagnosis not present

## 2016-12-13 LAB — TSH: TSH: 1.38 u[IU]/mL (ref 0.35–4.50)

## 2016-12-13 LAB — CBC WITH DIFFERENTIAL/PLATELET
BASOS ABS: 0 10*3/uL (ref 0.0–0.1)
BASOS PCT: 0.7 % (ref 0.0–3.0)
EOS PCT: 2.9 % (ref 0.0–5.0)
Eosinophils Absolute: 0.2 10*3/uL (ref 0.0–0.7)
HEMATOCRIT: 42.2 % (ref 39.0–52.0)
Hemoglobin: 13.4 g/dL (ref 13.0–17.0)
LYMPHS ABS: 1.7 10*3/uL (ref 0.7–4.0)
LYMPHS PCT: 24.2 % (ref 12.0–46.0)
MCHC: 31.7 g/dL (ref 30.0–36.0)
MCV: 84.6 fl (ref 78.0–100.0)
MONOS PCT: 7.5 % (ref 3.0–12.0)
Monocytes Absolute: 0.5 10*3/uL (ref 0.1–1.0)
NEUTROS ABS: 4.5 10*3/uL (ref 1.4–7.7)
NEUTROS PCT: 64.7 % (ref 43.0–77.0)
PLATELETS: 288 10*3/uL (ref 150.0–400.0)
RBC: 4.99 Mil/uL (ref 4.22–5.81)
RDW: 15.6 % — AB (ref 11.5–15.5)
WBC: 7 10*3/uL (ref 4.0–10.5)

## 2016-12-13 LAB — MICROALBUMIN / CREATININE URINE RATIO
CREATININE, U: 97.3 mg/dL
MICROALB UR: 1 mg/dL (ref 0.0–1.9)
MICROALB/CREAT RATIO: 1 mg/g (ref 0.0–30.0)

## 2016-12-13 LAB — PSA: PSA: 0.59 ng/mL (ref 0.10–4.00)

## 2016-12-13 MED ORDER — METFORMIN HCL 1000 MG PO TABS: 1000.0000 mg | ORAL_TABLET | Freq: Two times a day (BID) | ORAL | 3 refills | Status: DC

## 2016-12-13 MED ORDER — HYDROCODONE-ACETAMINOPHEN 10-325 MG PO TABS: 1.0000 | ORAL_TABLET | Freq: Three times a day (TID) | ORAL | 0 refills | Status: DC | PRN

## 2016-12-13 NOTE — Progress Notes (Signed)
Subjective:    Patient ID: David Kelly, male    DOB: 06/04/1945, 71 y.o.   MRN: [id number]  HPI  Here for wellness and f/u;  Overall doing ok;  Pt denies Chest pain, worsening SOB, DOE, wheezing, orthopnea, PND, worsening LE edema, palpitations, dizziness or syncope.  Pt denies neurological change such as new headache, facial or extremity weakness.  Pt denies polydipsia, polyuria, or low sugar symptoms. Pt states overall good compliance with treatment and medications, good tolerability, and has been trying to follow appropriate diet.  Pt denies worsening depressive symptoms, suicidal ideation or panic. No fever, night sweats, wt loss, loss of appetite, or other constitutional symptoms.  Pt states good ability with ADL's, has low to mod fall risk with several trips and falls recently without injury, home safety reviewed and adequate, no other significant changes in hearing or vision, and not active with exercise.  Declined flu shot and immunizations. Due for eye doctor f/ui.  Has overall stable pain control, asks for refill.  Past Medical History:  Diagnosis Date  . Arthritis   . BPH (benign prostatic hypertrophy)   . Diabetes mellitus    DX  5 YR AGO  . Glaucoma   . Kidney stones    Past Surgical History:  Procedure Laterality Date  . CYSTOSCOPY WITH RETROGRADE PYELOGRAM, URETEROSCOPY AND STENT PLACEMENT Right 05/20/2016   Procedure: CYSTOSCOPY, URETEROSCOPY, TRANSURETHRAL INCISION OF URETHROCELE, BASKET STONE REMOVAL;  Surgeon: Irine Seal, MD;  Location: [facility];  Service: Urology;  Laterality: Right;  . left total knee replacement    . TOTAL KNEE ARTHROPLASTY  04/04/2011   Procedure: TOTAL KNEE ARTHROPLASTY;  Surgeon: Rudean Haskell, MD;  Location: [facility];  Service: Orthopedics;  Laterality: Left;    reports that he quit smoking about 10 years ago. His smoking use included Cigarettes. He has a 67.50 pack-year smoking history. His smokeless tobacco use includes Chew. He reports that he does  not drink alcohol or use drugs. family history includes Arthritis in his mother; Asthma in his paternal grandfather; Diabetes in his sister. No Known Allergies Current Outpatient Prescriptions on File Prior to Visit  Medication Sig Dispense Refill  . brimonidine-timolol (COMBIGAN) 0.2-0.5 % ophthalmic solution Place 1 drop into both eyes every 12 (twelve) hours. 15 mL 1  . Fluocinolone Acetonide 0.01 % OIL Place 5 drops in ear(s) 2 (two) times daily. 20 mL 1  . glucose blood (IGLUCOSE TEST STRIPS) test strip Use as instructed 100 each 12  . montelukast (SINGULAIR) 10 MG tablet Take 1 tablet (10 mg total) by mouth at bedtime. 90 tablet 1  . Multiple Vitamin (MULTIVITAMIN WITH MINERALS) TABS tablet Take 1 tablet by mouth daily.    . tamsulosin (FLOMAX) 0.4 MG CAPS capsule Take 0.4 mg by mouth at bedtime.     . triamcinolone (KENALOG) 0.025 % ointment Apply 1 application topically 2 (two) times daily as needed. 30 g 0  . aspirin EC 81 MG tablet Take 81 mg by mouth at bedtime.     No current facility-administered medications on file prior to visit.    Review of Systems Constitutional: Negative for other unusual diaphoresis, sweats, appetite or weight changes HENT: Negative for other worsening hearing loss, ear pain, facial swelling, mouth sores or neck stiffness.   Eyes: Negative for other worsening pain, redness or other visual disturbance.  Respiratory: Negative for other stridor or swelling Cardiovascular: Negative for other palpitations or other chest pain  Gastrointestinal: Negative for worsening diarrhea or  loose stools, blood in stool, distention or other pain Genitourinary: Negative for hematuria, flank pain or other change in urine volume.  Musculoskeletal: Negative for myalgias or other joint swelling.  Skin: Negative for other color change, or other wound or worsening drainage.  Neurological: Negative for other syncope or numbness. Hematological: Negative for other adenopathy or  swelling Psychiatric/Behavioral: Negative for hallucinations, other worsening agitation, SI, self-injury, or new decreased concentration All other system neg per pt    Objective:   Physical Exam BP 100/70 (BP Location: Left Arm, Patient Position: Sitting, Cuff Size: Large)   Pulse 74   Temp 98.2 F (36.8 C) (Oral)   Resp 18   Wt 209 lb 6 oz (95 kg)   SpO2 95%   BMI 28.40 kg/m  VS noted,  Constitutional: Pt is oriented to person, place, and time. Appears well-developed and well-nourished, in no significant distress and comfortable Head: Normocephalic and atraumatic  Eyes: Conjunctivae and EOM are normal. Pupils are equal, round, and reactive to light Right Ear: External ear normal without discharge Left Ear: External ear normal without discharge Nose: Nose without discharge or deformity Mouth/Throat: Oropharynx is without other ulcerations and moist  Neck: Normal range of motion. Neck supple. No JVD present. No tracheal deviation present or significant neck LA or mass Cardiovascular: Normal rate, regular rhythm, normal heart sounds and intact distal pulses.   Pulmonary/Chest: WOB normal and breath sounds without rales or wheezing  Abdominal: Soft. Bowel sounds are normal. NT. No HSM  Musculoskeletal: Normal range of motion. Exhibits no edema Lymphadenopathy: Has no other cervical adenopathy.  Neurological: Pt is alert and oriented to person, place, and time. Pt has normal reflexes. No cranial nerve deficit. Motor grossly intact, Gait intact Skin: Skin is warm and dry. No rash noted or new ulcerations Psychiatric:  Has normal mood and affect. Behavior is normal without agitation No other exam findings Lab Results  Component Value Date   WBC 8.6 06/28/2016   HGB 11.1 (L) 06/28/2016   HCT 34.1 (L) 06/28/2016   PLT 370.0 06/28/2016   GLUCOSE 177 (H) 05/17/2016   ALT 30 05/17/2016   AST 30 05/17/2016   NA 142 05/17/2016   K 4.1 05/17/2016   CL 107 05/17/2016   CREATININE 0.79  05/17/2016   BUN 13 05/17/2016   CO2 22 05/17/2016   INR 1.06 03/22/2011   HGBA1C 6.7 (H) 09/23/2015   MICROALBUR 1.5 09/23/2015       Assessment & Plan:

## 2016-12-13 NOTE — Patient Instructions (Addendum)
You will be contacted regarding the referral for: podiatry  Please continue all other medications as before, and refills have been done if requested - the hydrocodone  Please have the pharmacy call with any other refills you may need.  Please continue your efforts at being more active, low cholesterol diet, and weight control.  You are otherwise up to date with prevention measures today.  Please keep your appointments with your specialists as you may have planned  Please go to the LAB in the Basement (turn left off the elevator) for the tests to be done today  You will be contacted by phone if any changes need to be made immediately.  Otherwise, you will receive a letter about your results with an explanation, but please check with MyChart first.  Please remember to sign up for MyChart if you have not done so, as this will be important to you in the future with finding out test results, communicating by private email, and scheduling acute appointments online when needed.  Please return in 3 months, or sooner if needed

## 2016-12-13 NOTE — Assessment & Plan Note (Addendum)
Ok for cane for outside the home

## 2016-12-13 NOTE — Assessment & Plan Note (Signed)

## 2016-12-13 NOTE — Assessment & Plan Note (Addendum)
stable overall by history and exam, recent data reviewed with pt, and pt to continue medical treatment as before,  to f/u any worsening symptoms or concerns Lab Results  Component Value Date   HGBA1C 6.7 (H) 09/23/2015   In addition to the time spent performing CPE, I spent an additional 15 minutes face to face,in which greater than 50% of this time was spent in counseling and coordination of care for patient's illness as documented,. Including the differential dx, tx, further evaluation and other management of DM, chronic pain and recurring falls.

## 2016-12-13 NOTE — Assessment & Plan Note (Signed)
Stable, for med refill,  to f/u any worsening symptoms or concerns 

## 2016-12-14 ENCOUNTER — Encounter: Payer: Self-pay | Admitting: Internal Medicine

## 2016-12-14 ENCOUNTER — Other Ambulatory Visit: Payer: Self-pay | Admitting: Internal Medicine

## 2016-12-14 LAB — HEPATIC FUNCTION PANEL
ALBUMIN: 4.4 g/dL (ref 3.5–5.2)
ALK PHOS: 56 U/L (ref 39–117)
ALT: 22 U/L (ref 0–53)
AST: 19 U/L (ref 0–37)
Bilirubin, Direct: 0.1 mg/dL (ref 0.0–0.3)
Total Bilirubin: 0.3 mg/dL (ref 0.2–1.2)
Total Protein: 7.4 g/dL (ref 6.0–8.3)

## 2016-12-14 LAB — URINALYSIS, ROUTINE W REFLEX MICROSCOPIC
Bilirubin Urine: NEGATIVE
HGB URINE DIPSTICK: NEGATIVE
Ketones, ur: NEGATIVE
Leukocytes, UA: NEGATIVE
NITRITE: NEGATIVE
RBC / HPF: NONE SEEN (ref 0–?)
Specific Gravity, Urine: 1.025 (ref 1.000–1.030)
TOTAL PROTEIN, URINE-UPE24: NEGATIVE
Urine Glucose: NEGATIVE
Urobilinogen, UA: 0.2 (ref 0.0–1.0)
WBC, UA: NONE SEEN (ref 0–?)
pH: 5.5 (ref 5.0–8.0)

## 2016-12-14 LAB — BASIC METABOLIC PANEL
BUN: 14 mg/dL (ref 6–23)
CHLORIDE: 102 meq/L (ref 96–112)
CO2: 26 mEq/L (ref 19–32)
Calcium: 9.7 mg/dL (ref 8.4–10.5)
Creatinine, Ser: 0.79 mg/dL (ref 0.40–1.50)
GFR: 124.08 mL/min (ref >60.00)
GLUCOSE: 82 mg/dL (ref 70–99)
POTASSIUM: 4.4 meq/L (ref 3.5–5.1)
SODIUM: 138 meq/L (ref 135–145)

## 2016-12-14 LAB — LIPID PANEL
CHOLESTEROL: 251 mg/dL — AB (ref 0–200)
HDL: 33 mg/dL — ABNORMAL LOW (ref >39.00)
NonHDL: 218.24
Total CHOL/HDL Ratio: 8
Triglycerides: 292 mg/dL — ABNORMAL HIGH (ref 0.0–149.0)
VLDL: 58.4 mg/dL — ABNORMAL HIGH (ref 0.0–40.0)

## 2016-12-14 LAB — HEMOGLOBIN A1C: HEMOGLOBIN A1C: 6.8 % — AB (ref 4.6–6.5)

## 2016-12-14 LAB — HEPATITIS C ANTIBODY
HEP C AB: NONREACTIVE
SIGNAL TO CUT-OFF: 0.01 (ref <1.00)

## 2016-12-14 LAB — LDL CHOLESTEROL, DIRECT: LDL DIRECT: 188 mg/dL

## 2016-12-14 MED ORDER — ATORVASTATIN CALCIUM 40 MG PO TABS: 40.0000 mg | ORAL_TABLET | Freq: Every day | ORAL | 3 refills | Status: DC

## 2016-12-15 ENCOUNTER — Telehealth: Payer: Self-pay

## 2016-12-15 NOTE — Telephone Encounter (Signed)
Pt has been informed and expressed understanding.  

## 2016-12-15 NOTE — Telephone Encounter (Signed)
-----   Message from Biagio Borg, MD sent at 12/14/2016 12:36 PM EDT ----- Letter sent, cont same tx except  The test results show that your current treatment is OK, except the LDL cholesterol is VERY high, since the goal is to be less than 70.  This increases your risk of heart disease and stroke.  Please follow a lower cholesterol diet, and also start generic Lipitor 40 mg per day.  I will send a prescription, and you should hear from the office as well.Redmond Baseman to please inform pt, I will do rx

## 2016-12-27 ENCOUNTER — Telehealth: Payer: Self-pay | Admitting: Internal Medicine

## 2016-12-27 DIAGNOSIS — E119 Type 2 diabetes mellitus without complications: Secondary | ICD-10-CM

## 2016-12-27 MED ORDER — METFORMIN HCL 1000 MG PO TABS: 1000.0000 mg | ORAL_TABLET | Freq: Two times a day (BID) | ORAL | 3 refills | Status: DC

## 2016-12-27 NOTE — Telephone Encounter (Signed)
Done

## 2016-12-27 NOTE — Telephone Encounter (Signed)
Pt called asking if the prescription for metFORMIN (GLUCOPHAGE) 1000 MG tablet could be resent to [facility].

## 2017-01-12 ENCOUNTER — Other Ambulatory Visit: Payer: Self-pay | Admitting: Internal Medicine

## 2017-01-12 DIAGNOSIS — M150 Primary generalized (osteo)arthritis: Principal | ICD-10-CM

## 2017-01-12 DIAGNOSIS — M159 Polyosteoarthritis, unspecified: Secondary | ICD-10-CM

## 2017-01-12 MED ORDER — TRIAMCINOLONE ACETONIDE 0.5 % EX OINT: 1.0000 "application " | TOPICAL_OINTMENT | Freq: Two times a day (BID) | CUTANEOUS | 1 refills | Status: DC

## 2017-01-12 MED ORDER — TRIAMCINOLONE ACETONIDE 0.025 % EX OINT: 1.0000 "application " | TOPICAL_OINTMENT | Freq: Two times a day (BID) | CUTANEOUS | 0 refills | Status: DC | PRN

## 2017-01-12 MED ORDER — BRIMONIDINE TARTRATE-TIMOLOL 0.2-0.5 % OP SOLN: 1.0000 [drp] | Freq: Two times a day (BID) | OPHTHALMIC | 1 refills | Status: DC

## 2017-01-12 MED ORDER — HYDROCODONE-ACETAMINOPHEN 10-325 MG PO TABS: 1.0000 | ORAL_TABLET | Freq: Three times a day (TID) | ORAL | 0 refills | Status: DC | PRN

## 2017-01-12 NOTE — Telephone Encounter (Signed)
Pls advise on renewal of Hydrocodone 10/325 mg as well...David Kelly

## 2017-01-12 NOTE — Telephone Encounter (Signed)
Ok, this was changed

## 2017-01-12 NOTE — Telephone Encounter (Signed)
Done erx 

## 2017-01-12 NOTE — Telephone Encounter (Signed)
[facility] is calling kenalog does not come in 0.025 ointment.. The medication does come in lotion or cream. The combigan come in 5 ml which is 20 day supply. Please verify medication

## 2017-01-12 NOTE — Addendum Note (Signed)
Addended by: Biagio Borg on: 01/12/2017 05:29 PM   Modules accepted: Orders

## 2017-01-12 NOTE — Telephone Encounter (Signed)
Request for controlled substance; see CRM # [phone]

## 2017-01-12 NOTE — Telephone Encounter (Signed)
Check [place] registry last filled hydrocodone 12/14/2016, other refills already been sent.Marland KitchenJohny Kelly

## 2017-01-12 NOTE — Telephone Encounter (Signed)
Copied from [place] [id number]. Topic: Quick Communication - See Telephone Encounter >> Jan 12, 2017  9:28 AM Burnis Medin, NT wrote: CRM for notification. See Telephone encounter for: Pt is calling in requesting a refill for HYDROcodone-acetaminophen (NORCO) 10-325 MG tablet  triamcinolone (KENALOG) 0.025 % ointment and brimonidine-timolol (COMBIGAN) 0.2-0.5 % ophthalmic. Pt uses [facility] in [facility]. 01/12/17.

## 2017-01-13 ENCOUNTER — Other Ambulatory Visit: Payer: Self-pay | Admitting: Internal Medicine

## 2017-01-13 DIAGNOSIS — M159 Polyosteoarthritis, unspecified: Secondary | ICD-10-CM

## 2017-01-13 DIAGNOSIS — G8929 Other chronic pain: Secondary | ICD-10-CM

## 2017-01-13 DIAGNOSIS — M150 Primary generalized (osteo)arthritis: Principal | ICD-10-CM

## 2017-01-13 MED ORDER — HYDROCODONE-ACETAMINOPHEN 10-325 MG PO TABS: 1.0000 | ORAL_TABLET | Freq: Three times a day (TID) | ORAL | 0 refills | Status: DC | PRN

## 2017-01-13 MED ORDER — TRIAMCINOLONE ACETONIDE 0.5 % EX OINT: 1.0000 "application " | TOPICAL_OINTMENT | Freq: Two times a day (BID) | CUTANEOUS | 1 refills | Status: DC

## 2017-01-13 NOTE — Telephone Encounter (Signed)
[place] for triam cream and hydrocodone only  Please let pt know, that it appears he has a chronic pain condition that requires regular tx with narcotic.  I do not do this as a part of my practice (I dont treat chronic pain with schedule II or higher narcotic)  If he needs further tx after this prescription, I will need to refer to Pain management.  I'll go ahead and do this now

## 2017-02-08 ENCOUNTER — Encounter: Payer: Self-pay | Admitting: Physical Medicine & Rehabilitation

## 2017-02-10 ENCOUNTER — Telehealth: Payer: Self-pay | Admitting: Internal Medicine

## 2017-02-10 DIAGNOSIS — M150 Primary generalized (osteo)arthritis: Secondary | ICD-10-CM

## 2017-02-10 DIAGNOSIS — G8929 Other chronic pain: Secondary | ICD-10-CM

## 2017-02-10 DIAGNOSIS — M159 Polyosteoarthritis, unspecified: Secondary | ICD-10-CM

## 2017-02-10 MED ORDER — HYDROCODONE-ACETAMINOPHEN 10-325 MG PO TABS: 1.0000 | ORAL_TABLET | Freq: Three times a day (TID) | ORAL | 0 refills | Status: DC | PRN

## 2017-02-10 NOTE — Telephone Encounter (Signed)
[place] for refill this time  Please let pt know that bc it seems he needs chronic pain medication, I will refer him to Pain Management as I do not normally continue Norco prescriptions more than a few months (and do not practice chronic pain management)

## 2017-02-10 NOTE — Telephone Encounter (Signed)
Pt requesting refill of Norco

## 2017-02-10 NOTE — Telephone Encounter (Signed)
Notified pt w/MD response. Inform rx sent to [facility].Marland KitchenJohny Chess

## 2017-02-10 NOTE — Telephone Encounter (Signed)
Copied from [place]. Topic: Quick Communication - See Telephone Encounter >> Feb 10, 2017  8:49 AM Burnis Medin, NT wrote: CRM for notification. See Telephone encounter for. Pt called in to get a medication refill for  HYDROcodone-acetaminophen (NORCO) 10-325 MG tablet. Pt uses  [place], [place] 02/10/17.

## 2017-02-21 DIAGNOSIS — D649 Anemia, unspecified: Secondary | ICD-10-CM | POA: Diagnosis not present

## 2017-02-21 DIAGNOSIS — D510 Vitamin B12 deficiency anemia due to intrinsic factor deficiency: Secondary | ICD-10-CM | POA: Diagnosis not present

## 2017-02-23 ENCOUNTER — Ambulatory Visit: Payer: Medicare Other | Admitting: Sports Medicine

## 2017-02-24 ENCOUNTER — Ambulatory Visit (INDEPENDENT_AMBULATORY_CARE_PROVIDER_SITE_OTHER): Payer: Medicare Other | Admitting: Sports Medicine

## 2017-02-24 ENCOUNTER — Encounter: Payer: Self-pay | Admitting: Sports Medicine

## 2017-02-24 DIAGNOSIS — B351 Tinea unguium: Secondary | ICD-10-CM | POA: Diagnosis not present

## 2017-02-24 DIAGNOSIS — E119 Type 2 diabetes mellitus without complications: Secondary | ICD-10-CM

## 2017-02-24 DIAGNOSIS — M79676 Pain in unspecified toe(s): Secondary | ICD-10-CM

## 2017-02-24 NOTE — Progress Notes (Signed)
Subjective: David Kelly is a 72 y.o. male patient with history of diabetes who returns to office today complaining of long, painful nails  while ambulating in shoes; unable to trim. Patient states that the glucose reading this morning was 167 mg/dl. Patient denies any new changes in medication or new problems. Patient denies any new cramping, numbness, burning or tingling in the legs.  Saw primary care Dr. Cathlean Cower 2-3 months ago.   Patient Active Problem List   Diagnosis Date Noted  . Recurrent falls while walking 12/13/2016  . Preventative health care 12/13/2016  . Chronic pain 12/13/2016  . Kidney stones   . Glaucoma   . Gastrointestinal bleed 06/28/2016  . Dysuria 10/28/2015  . Itching of ear 08/13/2015  . Type 2 diabetes mellitus ([place]) 07/15/2015  . Osteoarthritis 07/15/2015   Current Outpatient Medications on File Prior to Visit  Medication Sig Dispense Refill  . aspirin EC 81 MG tablet Take 81 mg by mouth at bedtime.    Marland Kitchen atorvastatin (LIPITOR) 40 MG tablet Take 1 tablet (40 mg total) by mouth daily. 90 tablet 3  . brimonidine-timolol (COMBIGAN) 0.2-0.5 % ophthalmic solution Place 1 drop into both eyes every 12 (twelve) hours. 15 mL 1  . Fluocinolone Acetonide 0.01 % OIL Place 5 drops in ear(s) 2 (two) times daily. 20 mL 1  . glucose blood (IGLUCOSE TEST STRIPS) test strip Use as instructed 100 each 12  . HYDROcodone-acetaminophen (NORCO) 10-325 MG tablet Take 1 tablet by mouth every 8 (eight) hours as needed. 90 tablet 0  . metFORMIN (GLUCOPHAGE) 1000 MG tablet Take 1 tablet (1,000 mg total) 2 (two) times daily with a meal by mouth. Must keep appt w/new provider for future refills 180 tablet 3  . montelukast (SINGULAIR) 10 MG tablet Take 1 tablet (10 mg total) by mouth at bedtime. 90 tablet 1  . Multiple Vitamin (MULTIVITAMIN WITH MINERALS) TABS tablet Take 1 tablet by mouth daily.    . tamsulosin (FLOMAX) 0.4 MG CAPS capsule Take 0.4 mg by mouth at bedtime.     .  triamcinolone ointment (KENALOG) 0.5 % Apply 1 application topically 2 (two) times daily. 30 g 1   No current facility-administered medications on file prior to visit.    No Known Allergies  Recent Results (from the past 2160 hour(s))  Microalbumin / creatinine urine ratio     Status: None   Collection Time: 12/13/16  5:12 PM  Result Value Ref Range   Microalb, Ur 1.0 0.0 - 1.9 mg/dL   Creatinine,U 97.3 mg/dL   Microalb Creat Ratio 1.0 0.0 - 30.0 mg/g  Hemoglobin A1c     Status: Abnormal   Collection Time: 12/13/16  5:12 PM  Result Value Ref Range   Hgb A1c MFr Bld 6.8 (H) 4.6 - 6.5 %    Comment: Glycemic Control Guidelines for People with Diabetes:Non Diabetic:  <6%Goal of Therapy: <7%Additional Action Suggested:  >8%   Lipid panel     Status: Abnormal   Collection Time: 12/13/16  5:12 PM  Result Value Ref Range   Cholesterol 251 (H) 0 - 200 mg/dL    Comment: ATP III Classification       Desirable:  < 200 mg/dL               Borderline High:  200 - 239 mg/dL          High:  > = 240 mg/dL   Triglycerides 292.0 (H) 0.0 - 149.0 mg/dL  Comment: Normal:  <150 mg/dLBorderline High:  150 - 199 mg/dL   HDL 33.00 (L) >39.00 mg/dL   VLDL 58.4 (H) 0.0 - 40.0 mg/dL   Total CHOL/HDL Ratio 8     Comment:                Men          Women1/2 Average Risk     3.4          3.3Average Risk          5.0          4.42X Average Risk          9.6          7.13X Average Risk          15.0          11.0                       NonHDL 218.24     Comment: NOTE:  Non-HDL goal should be 30 mg/dL higher than patient's LDL goal (i.e. LDL goal of < 70 mg/dL, would have non-HDL goal of < 100 mg/dL)  Basic metabolic panel     Status: None   Collection Time: 12/13/16  5:12 PM  Result Value Ref Range   Sodium 138 135 - 145 mEq/L   Potassium 4.4 3.5 - 5.1 mEq/L   Chloride 102 96 - 112 mEq/L   CO2 26 19 - 32 mEq/L   Glucose, Bld 82 70 - 99 mg/dL   BUN 14 6 - 23 mg/dL   Creatinine, Ser 0.79 0.40 - 1.50 mg/dL    Calcium 9.7 8.4 - 10.5 mg/dL   GFR 124.08 >60.00 mL/min  Hepatic function panel     Status: None   Collection Time: 12/13/16  5:12 PM  Result Value Ref Range   Total Bilirubin 0.3 0.2 - 1.2 mg/dL   Bilirubin, Direct 0.1 0.0 - 0.3 mg/dL   Alkaline Phosphatase 56 39 - 117 U/L   AST 19 0 - 37 U/L   ALT 22 0 - 53 U/L   Total Protein 7.4 6.0 - 8.3 g/dL   Albumin 4.4 3.5 - 5.2 g/dL  CBC with Differential/Platelet     Status: Abnormal   Collection Time: 12/13/16  5:12 PM  Result Value Ref Range   WBC 7.0 4.0 - 10.5 K/uL   RBC 4.99 4.22 - 5.81 Mil/uL   Hemoglobin 13.4 13.0 - 17.0 g/dL   HCT 42.2 39.0 - 52.0 %   MCV 84.6 78.0 - 100.0 fl   MCHC 31.7 30.0 - 36.0 g/dL   RDW 15.6 (H) 11.5 - 15.5 %   Platelets 288.0 150.0 - 400.0 K/uL   Neutrophils Relative % 64.7 43.0 - 77.0 %   Lymphocytes Relative 24.2 12.0 - 46.0 %   Monocytes Relative 7.5 3.0 - 12.0 %   Eosinophils Relative 2.9 0.0 - 5.0 %   Basophils Relative 0.7 0.0 - 3.0 %   Neutro Abs 4.5 1.4 - 7.7 K/uL   Lymphs Abs 1.7 0.7 - 4.0 K/uL   Monocytes Absolute 0.5 0.1 - 1.0 K/uL   Eosinophils Absolute 0.2 0.0 - 0.7 K/uL   Basophils Absolute 0.0 0.0 - 0.1 K/uL  TSH     Status: None   Collection Time: 12/13/16  5:12 PM  Result Value Ref Range   TSH 1.38 0.35 - 4.50 uIU/mL  Urinalysis, Routine w reflex microscopic  Status: Abnormal   Collection Time: 12/13/16  5:12 PM  Result Value Ref Range   Color, Urine YELLOW Yellow;Lt. Yellow   APPearance CLEAR Clear   Specific Gravity, Urine 1.025 1.000 - 1.030   pH 5.5 5.0 - 8.0   Total Protein, Urine NEGATIVE Negative   Urine Glucose NEGATIVE Negative   Ketones, ur NEGATIVE Negative   Bilirubin Urine NEGATIVE Negative   Hgb urine dipstick NEGATIVE Negative   Urobilinogen, UA 0.2 0.0 - 1.0   Leukocytes, UA NEGATIVE Negative   Nitrite NEGATIVE Negative   WBC, UA none seen 0-2/hpf   RBC / HPF none seen 0-2/hpf   Mucus, UA Presence of (A) None  PSA     Status: None   Collection Time:  12/13/16  5:12 PM  Result Value Ref Range   PSA 0.59 0.10 - 4.00 ng/mL    Comment: Test performed using Access Hybritech PSA Assay, a parmagnetic partical, chemiluminecent immunoassay.  Hepatitis C Antibody     Status: None   Collection Time: 12/13/16  5:12 PM  Result Value Ref Range   Hepatitis C Ab NON-REACTIVE NON-REACTI   SIGNAL TO CUT-OFF 0.01 <1.00  LDL cholesterol, direct     Status: None   Collection Time: 12/13/16  5:12 PM  Result Value Ref Range   Direct LDL 188.0 mg/dL    Comment: Optimal:  <100 mg/dLNear or Above Optimal:  100-129 mg/dLBorderline High:  130-159 mg/dLHigh:  160-189 mg/dLVery High:  >190 mg/dL    Objective: General: Patient is awake, alert, and oriented x 3 and in no acute distress.  Integument: Skin is warm, dry and supple bilateral. Nails are tender, long, thickened and dystrophic with subungual debris, consistent with onychomycosis, 1-5 bilateral. No signs of infection. No open lesions or preulcerative lesions present bilateral. Remaining integument unremarkable.  Vasculature:  Dorsalis Pedis pulse 2/4 bilateral. Posterior Tibial pulse 1 /4 bilateral. Capillary fill time <3 sec 1-5 bilateral. Positive hair growth to the level of the digits.Temperature gradient within normal limits. No varicosities present bilateral. No edema present bilateral.   Neurology: The patient has intact sensation measured with a 5.07/10g Semmes Weinstein Monofilament at all pedal sites bilateral . Vibratory sensation diminished bilateral with tuning fork. No Babinski sign present bilateral.   Musculoskeletal: Asymptomatic bunion and hammertoe pedal deformities noted bilateral. Muscular strength 5/5 in all lower extremity muscular groups bilateral without pain on range of motion. No tenderness with calf compression bilateral.  Assessment and Plan: Problem List Items Addressed This Visit    None    Visit Diagnoses    Pain due to onychomycosis of toenail    -  Primary   Diabetes  mellitus without complication ([place])         -Examined patient. -Discussed and educated patient on diabetic foot care, especially with  regards to the vascular, neurological and musculoskeletal systems.  -Stressed the importance of good glycemic control and the detriment of not  controlling glucose levels in relation to the foot. -Mechanically debrided all nails 1-5 bilateral using sterile nail nipper and filed with dremel without incident  -Recommend good supportive shoes for foot type -Answered all patient questions -Patient to return  in 3 months for at risk foot care -Patient advised to call the office if any problems or questions arise in the meantime.  Landis Martins, DPM

## 2017-03-08 DIAGNOSIS — K921 Melena: Secondary | ICD-10-CM | POA: Diagnosis not present

## 2017-03-14 ENCOUNTER — Other Ambulatory Visit: Payer: Self-pay | Admitting: Internal Medicine

## 2017-03-14 DIAGNOSIS — M150 Primary generalized (osteo)arthritis: Principal | ICD-10-CM

## 2017-03-14 DIAGNOSIS — M159 Polyosteoarthritis, unspecified: Secondary | ICD-10-CM

## 2017-03-14 NOTE — Telephone Encounter (Signed)
[place] controlled substance database queried, report printed/signed and sent to EMR scan  Done erx

## 2017-03-15 ENCOUNTER — Ambulatory Visit: Payer: Medicare Other | Admitting: Internal Medicine

## 2017-03-15 ENCOUNTER — Encounter: Payer: Self-pay | Admitting: Physical Medicine & Rehabilitation

## 2017-03-15 ENCOUNTER — Encounter: Payer: Medicare Other | Attending: Physical Medicine & Rehabilitation | Admitting: Physical Medicine & Rehabilitation

## 2017-03-15 DIAGNOSIS — M75101 Unspecified rotator cuff tear or rupture of right shoulder, not specified as traumatic: Secondary | ICD-10-CM | POA: Diagnosis not present

## 2017-03-15 DIAGNOSIS — H42 Glaucoma in diseases classified elsewhere: Secondary | ICD-10-CM | POA: Insufficient documentation

## 2017-03-15 DIAGNOSIS — Z87442 Personal history of urinary calculi: Secondary | ICD-10-CM | POA: Insufficient documentation

## 2017-03-15 DIAGNOSIS — Z87891 Personal history of nicotine dependence: Secondary | ICD-10-CM | POA: Insufficient documentation

## 2017-03-15 DIAGNOSIS — F419 Anxiety disorder, unspecified: Secondary | ICD-10-CM | POA: Insufficient documentation

## 2017-03-15 DIAGNOSIS — S3992XS Unspecified injury of lower back, sequela: Secondary | ICD-10-CM | POA: Diagnosis not present

## 2017-03-15 DIAGNOSIS — M1711 Unilateral primary osteoarthritis, right knee: Secondary | ICD-10-CM | POA: Diagnosis not present

## 2017-03-15 DIAGNOSIS — M545 Low back pain: Secondary | ICD-10-CM | POA: Diagnosis not present

## 2017-03-15 DIAGNOSIS — E1139 Type 2 diabetes mellitus with other diabetic ophthalmic complication: Secondary | ICD-10-CM | POA: Insufficient documentation

## 2017-03-15 DIAGNOSIS — M25561 Pain in right knee: Secondary | ICD-10-CM | POA: Diagnosis present

## 2017-03-15 DIAGNOSIS — G8929 Other chronic pain: Secondary | ICD-10-CM | POA: Diagnosis not present

## 2017-03-15 DIAGNOSIS — M75102 Unspecified rotator cuff tear or rupture of left shoulder, not specified as traumatic: Secondary | ICD-10-CM

## 2017-03-15 DIAGNOSIS — Z96652 Presence of left artificial knee joint: Secondary | ICD-10-CM | POA: Insufficient documentation

## 2017-03-15 DIAGNOSIS — M25562 Pain in left knee: Secondary | ICD-10-CM | POA: Insufficient documentation

## 2017-03-15 MED ORDER — DICLOFENAC SODIUM 1 % TD GEL
1.0000 | Freq: Three times a day (TID) | TRANSDERMAL | 4 refills | Status: DC
Start: 2017-03-15 — End: 2021-06-25

## 2017-03-15 NOTE — Progress Notes (Signed)
Subjective:    Patient ID: David Kelly, male    DOB: 01-13-1946, 72 y.o.   MRN: [id number]  HPI   This is an initial visit for [name] who complains of chronic pain in his right knee and left knees. He had a left TKA 6 years ago by Dr. Ronnie Derby but his right knee has become progressively worse. He states that the knee occasionally swells and bothers him the most during "bad weather" or if he's on his feet. He takes hydrocodone typically twice a da for pain which are prescribed by Dr. Judi Cong. He doesn't use heat or ice. He does not remember the last time his knee was xrayed. He occasionally uses ES tylenol.   Both shoulders bother him as well. He has had the pain for about 10 years.  He denies any accident or trauma leading to the pain and says that the pain came on slowly over time.  He has had PT for the shoulders but doesn't do shoulders exercises regularly.  He also complains of right low back pain since last spring when he developed a kidney stone.  I found a CT scan of his kidneys and pelvis from April of last year which showed notable stones and hydronephrosis.  He states that his problems were treated and that he no longer sees a urologist.  He tells me that his diabetes is under reasonable control.  He walks daily with his dog about a mile daily to help with his "insulin secretion".  .      Pain Inventory Average Pain 8 Pain Right Now 8 My pain is constant, stabbing, tingling and throbing  In the last 24 hours, has pain interfered with the following? General activity 6 Relation with others 6 Enjoyment of life 7 What TIME of day is your pain at its worst? all Sleep (in general) Poor  Pain is worse with: walking, bending, standing and some activites Pain improves with: medication Relief from Meds: 5  Mobility walk with assistance use a cane  Function disabled: date disabled .  Neuro/Psych weakness tingling trouble walking spasms dizziness anxiety  Prior  Studies new visit  Physicians involved in your care new visit   Family History  Problem Relation Age of Onset  . Arthritis Mother   . Asthma Paternal Grandfather   . Diabetes Sister    Social History   Socioeconomic History  . Marital status: Single    Spouse name: None  . Number of children: 4  . Years of education: 7  . Highest education level: None  Social Needs  . Financial resource strain: None  . Food insecurity - worry: None  . Food insecurity - inability: None  . Transportation needs - medical: None  . Transportation needs - non-medical: None  Occupational History  . None  Tobacco Use  . Smoking status: Former Smoker    Packs/day: 1.50    Years: 45.00    Pack years: 67.50    Types: Cigarettes    Last attempt to quit: 03/21/2006    Years since quitting: 10.9  . Smokeless tobacco: Current User    Types: Chew  Substance and Sexual Activity  . Alcohol use: No  . Drug use: No  . Sexual activity: None  Other Topics Concern  . None  Social History Narrative   Fun: Fish and go to the park   Past Surgical History:  Procedure Laterality Date  . CYSTOSCOPY WITH RETROGRADE PYELOGRAM, URETEROSCOPY AND STENT PLACEMENT Right 05/20/2016  Procedure: CYSTOSCOPY, URETEROSCOPY, TRANSURETHRAL INCISION OF URETHROCELE, BASKET STONE REMOVAL;  Surgeon: Irine Seal, MD;  Location: [facility];  Service: Urology;  Laterality: Right;  . left total knee replacement    . TOTAL KNEE ARTHROPLASTY  04/04/2011   Procedure: TOTAL KNEE ARTHROPLASTY;  Surgeon: Rudean Haskell, MD;  Location: [facility];  Service: Orthopedics;  Laterality: Left;   Past Medical History:  Diagnosis Date  . Arthritis   . BPH (benign prostatic hypertrophy)   . Diabetes mellitus    DX  5 YR AGO  . Glaucoma   . Kidney stones    BP 133/83 (BP Location: Right Arm, Patient Position: Sitting, Cuff Size: Normal)   Pulse 85   Resp 14   SpO2 96%   Opioid Risk Score:   Fall Risk Score:  `1  Depression screen PHQ  2/9  Depression screen PHQ 2/9 12/13/2016  Decreased Interest 1  Down, Depressed, Hopeless 2  PHQ - 2 Score 3  Altered sleeping 2  Tired, decreased energy 0  Change in appetite 2  Feeling bad or failure about yourself  0  Trouble concentrating 0  Moving slowly or fidgety/restless 1  Suicidal thoughts 0  PHQ-9 Score 8  Difficult doing work/chores Not difficult at all    Review of Systems  HENT: Negative.   Eyes: Negative.   Respiratory: Negative.   Cardiovascular: Negative.   Gastrointestinal: Negative.   Endocrine: Negative.   Genitourinary: Negative.   Musculoskeletal: Positive for arthralgias, back pain and gait problem.  Skin: Negative.   Allergic/Immunologic: Negative.   Neurological: Positive for weakness and numbness.  Hematological: Negative.   Psychiatric/Behavioral: The patient is nervous/anxious.   All other systems reviewed and are negative.      Objective:   Physical Exam   General: Alert and oriented x 3, No apparent distress HEENT: Head is normocephalic, atraumatic, PERRLA, EOMI, sclera anicteric, oral mucosa pink and moist, dentition intact, ext ear canals clear,  Neck: Supple without JVD or lymphadenopathy Heart: Reg rate and rhythm. No murmurs rubs or gallops Chest: CTA bilaterally without wheezes, rales, or rhonchi; no distress Abdomen: Soft, non-tender, non-distended, bowel sounds positive. Extremities: No clubbing, cyanosis, or edema. Pulses are 2+ Skin: Clean and intact without signs of breakdown Neuro: Pt is cognitively appropriate with normal insight, memory, and awareness. Cranial nerves 2-12 are intact. Sensory exam is normal. Reflexes are 2+ in all 4's. Fine motor coordination is intact. No tremors. Motor function is grossly 5/5.  Musculoskeletal: Both shoulders somewhat tender with internal and external rotation.  Pain most prominent over the subacromial space and deltoid.  Impingement maneuver was positive bilaterally.  Neck range of motion  was functional.  Posture at the head neck was reasonable.  Low back range of motion was fair.  He had tenderness over the right flank region but minimal tenderness over the axial spine and waistline.  Straight leg testing was equivocal to negative.  Facet testing was negative.  Right knee notable for medial greater than lateral joint line pain.  Meniscal maneuvers only produced pain along the medial joint line.  Mild effusion was noted.  He has significant crepitus with extension and adduction.  He has notable antalgia with weightbearing on the right lower extremity. Psych: Pt's affect is appropriate. Pt is cooperative        Assessment & Plan:  1. OA right knee 2. Hx of left knee replacement 3. Bilateral rotator cuff syndrome 4. Low back pain, history of nephrolithiasis and hydronephrosis  Plan: 1. xrays right knee to assess joint space  2. xrays lumbar spine to assess low back.  This also will pick up any incidental stones on his right side.  I suspect that his pain at this point may be more related to his kidneys and his low back. 3.  Begin trial of Voltaren gel 3 times daily to right knee and bilateral shoulders 4.  Patient was provided extensive shoulder exercises to address range of motion of the rotator cuff and scapula.  Consider formal physical therapy 5.  Drug swab collected today.  I would be willing to assume prescription writing of his hydrocodone. 6.  Patient to follow-up with me in about 5 weeks time.  Greater than 30 minutes was spent in direct patient contact treatment today.

## 2017-03-27 ENCOUNTER — Telehealth: Payer: Self-pay | Admitting: Internal Medicine

## 2017-03-27 NOTE — Telephone Encounter (Signed)
Copied from [place] [id number]. Topic: Quick Communication - Rx Refill/Question >> Mar 27, 2017  3:38 PM Oliver Pila B wrote: Medication: promethazine-codeine (PHENERGAN WITH CODEINE) 6.25-10 MG/5ML syrup [[id number]] DISCONTINUED   Preferred Pharmacy (with phone number or street name): [facility]   Agent: Please be advised that RX refills may take up to 3 business days. We ask that you follow-up with your pharmacy.

## 2017-03-28 NOTE — Telephone Encounter (Signed)
Unfortunately, this is not normally a chronic refillable medication  Please consider ROV if having symptoms that might warrant this, thanks

## 2017-03-28 NOTE — Telephone Encounter (Signed)
Notified pt w/MD response. Made appt for 03/31/17.Marland KitchenJohny Chess

## 2017-03-31 ENCOUNTER — Encounter: Payer: Self-pay | Admitting: Internal Medicine

## 2017-03-31 ENCOUNTER — Telehealth: Payer: Self-pay | Admitting: Internal Medicine

## 2017-03-31 ENCOUNTER — Ambulatory Visit (INDEPENDENT_AMBULATORY_CARE_PROVIDER_SITE_OTHER): Payer: Medicare Other | Admitting: Internal Medicine

## 2017-03-31 ENCOUNTER — Other Ambulatory Visit: Payer: Self-pay | Admitting: Internal Medicine

## 2017-03-31 ENCOUNTER — Other Ambulatory Visit (INDEPENDENT_AMBULATORY_CARE_PROVIDER_SITE_OTHER): Payer: Medicare Other

## 2017-03-31 VITALS — BP 122/82 | HR 96 | Temp 98.5°F | Ht 72.0 in | Wt 213.0 lb

## 2017-03-31 DIAGNOSIS — R05 Cough: Secondary | ICD-10-CM

## 2017-03-31 DIAGNOSIS — R053 Chronic cough: Secondary | ICD-10-CM | POA: Insufficient documentation

## 2017-03-31 DIAGNOSIS — L299 Pruritus, unspecified: Secondary | ICD-10-CM

## 2017-03-31 DIAGNOSIS — E119 Type 2 diabetes mellitus without complications: Secondary | ICD-10-CM

## 2017-03-31 DIAGNOSIS — Z0000 Encounter for general adult medical examination without abnormal findings: Secondary | ICD-10-CM

## 2017-03-31 DIAGNOSIS — R059 Cough, unspecified: Secondary | ICD-10-CM

## 2017-03-31 LAB — HEPATIC FUNCTION PANEL
ALT: 25 U/L (ref 0–53)
AST: 21 U/L (ref 0–37)
Albumin: 4.5 g/dL (ref 3.5–5.2)
Alkaline Phosphatase: 61 U/L (ref 39–117)
BILIRUBIN TOTAL: 0.3 mg/dL (ref 0.2–1.2)
Bilirubin, Direct: 0 mg/dL (ref 0.0–0.3)
Total Protein: 7.5 g/dL (ref 6.0–8.3)

## 2017-03-31 LAB — CBC WITH DIFFERENTIAL/PLATELET
BASOS PCT: 0.4 % (ref 0.0–3.0)
Basophils Absolute: 0 10*3/uL (ref 0.0–0.1)
EOS PCT: 2.9 % (ref 0.0–5.0)
Eosinophils Absolute: 0.2 10*3/uL (ref 0.0–0.7)
HCT: 41.5 % (ref 39.0–52.0)
Hemoglobin: 13.5 g/dL (ref 13.0–17.0)
Lymphocytes Relative: 23.4 % (ref 12.0–46.0)
Lymphs Abs: 1.9 10*3/uL (ref 0.7–4.0)
MCHC: 32.4 g/dL (ref 30.0–36.0)
MCV: 83.6 fl (ref 78.0–100.0)
MONO ABS: 0.5 10*3/uL (ref 0.1–1.0)
Monocytes Relative: 6.6 % (ref 3.0–12.0)
Neutro Abs: 5.5 10*3/uL (ref 1.4–7.7)
Neutrophils Relative %: 66.7 % (ref 43.0–77.0)
Platelets: 272 10*3/uL (ref 150.0–400.0)
RBC: 4.96 Mil/uL (ref 4.22–5.81)
RDW: 14 % (ref 11.5–15.5)
WBC: 8.3 10*3/uL (ref 4.0–10.5)

## 2017-03-31 LAB — LIPID PANEL
Cholesterol: 139 mg/dL (ref 0–200)
HDL: 25.3 mg/dL — AB (ref >39.00)
NONHDL: 113.7
Total CHOL/HDL Ratio: 5
Triglycerides: 224 mg/dL — ABNORMAL HIGH (ref 0.0–149.0)
VLDL: 44.8 mg/dL — AB (ref 0.0–40.0)

## 2017-03-31 LAB — BASIC METABOLIC PANEL
BUN: 12 mg/dL (ref 6–23)
CO2: 25 meq/L (ref 19–32)
Calcium: 9 mg/dL (ref 8.4–10.5)
Chloride: 105 mEq/L (ref 96–112)
Creatinine, Ser: 0.8 mg/dL (ref 0.40–1.50)
GFR: 122.2 mL/min (ref >60.00)
GLUCOSE: 180 mg/dL — AB (ref 70–99)
POTASSIUM: 3.9 meq/L (ref 3.5–5.1)
SODIUM: 140 meq/L (ref 135–145)

## 2017-03-31 LAB — HEMOGLOBIN A1C: HEMOGLOBIN A1C: 8.6 % — AB (ref 4.6–6.5)

## 2017-03-31 LAB — LDL CHOLESTEROL, DIRECT: Direct LDL: 86 mg/dL

## 2017-03-31 MED ORDER — TRIAMCINOLONE ACETONIDE 0.5 % EX OINT: 1.0000 "application " | TOPICAL_OINTMENT | Freq: Two times a day (BID) | CUTANEOUS | 1 refills | Status: DC

## 2017-03-31 MED ORDER — PROMETHAZINE-CODEINE 6.25-10 MG/5ML PO SYRP: 5.0000 mL | ORAL_SOLUTION | ORAL | 0 refills | Status: AC | PRN

## 2017-03-31 MED ORDER — AZITHROMYCIN 250 MG PO TABS: ORAL_TABLET | ORAL | 1 refills | Status: DC

## 2017-03-31 MED ORDER — GLIPIZIDE ER 2.5 MG PO TB24: 2.5000 mg | ORAL_TABLET | Freq: Every day | ORAL | 3 refills | Status: DC

## 2017-03-31 NOTE — Progress Notes (Signed)
Subjective:    Patient ID: David Kelly, male    DOB: [birth year], 72 y.o.   MRN: [id number]  HPI  Here with acute onset mild to mod 2-3 days ST, HA, general weakness and malaise, with prod cough greenish sputum, but Pt denies chest pain, increased sob or doe, wheezing, orthopnea, PND, increased LE swelling, palpitations, dizziness or syncope. Pt denies new neurological symptoms such as new headache, or facial or extremity weakness or numbness   Pt denies polydipsia, polyuria,  Pt asks that all labs be forwarded to Dr Meisinheimer/GI in [place], including cbc's.  Does have some persistent ear itching mostly on the right, asks for triam ointment refill.  No other new complaints or interval hx Past Medical History:  Diagnosis Date  . Arthritis   . BPH (benign prostatic hypertrophy)   . Diabetes mellitus    DX  5 YR AGO  . Glaucoma   . Kidney stones    Past Surgical History:  Procedure Laterality Date  . CYSTOSCOPY WITH RETROGRADE PYELOGRAM, URETEROSCOPY AND STENT PLACEMENT Right 05/20/2016   Procedure: CYSTOSCOPY, URETEROSCOPY, TRANSURETHRAL INCISION OF URETHROCELE, BASKET STONE REMOVAL;  Surgeon: Irine Seal, MD;  Location: [facility];  Service: Urology;  Laterality: Right;  . left total knee replacement    . TOTAL KNEE ARTHROPLASTY  04/04/2011   Procedure: TOTAL KNEE ARTHROPLASTY;  Surgeon: Rudean Haskell, MD;  Location: [facility];  Service: Orthopedics;  Laterality: Left;    reports that he quit smoking about 11 years ago. His smoking use included cigarettes. He has a 67.50 pack-year smoking history. His smokeless tobacco use includes chew. He reports that he does not drink alcohol or use drugs. family history includes Arthritis in his mother; Asthma in his paternal grandfather; Diabetes in his sister. No Known Allergies Current Outpatient Medications on File Prior to Visit  Medication Sig Dispense Refill  . aspirin EC 81 MG tablet Take 81 mg by mouth at bedtime.    Marland Kitchen atorvastatin (LIPITOR)  40 MG tablet Take 1 tablet (40 mg total) by mouth daily. 90 tablet 3  . brimonidine-timolol (COMBIGAN) 0.2-0.5 % ophthalmic solution Place 1 drop into both eyes every 12 (twelve) hours. 15 mL 1  . diclofenac sodium (VOLTAREN) 1 % GEL Apply 1 application topically 3 (three) times daily. Right knee and both shoulders. 3 Tube 4  . Fluocinolone Acetonide 0.01 % OIL Place 5 drops in ear(s) 2 (two) times daily. 20 mL 1  . glucose blood (IGLUCOSE TEST STRIPS) test strip Use as instructed 100 each 12  . HYDROcodone-acetaminophen (NORCO) 10-325 MG tablet TAKE 1 TABLET BY MOUTH EVERY 8 HOURS AS NEEDED 90 tablet 0  . metFORMIN (GLUCOPHAGE) 1000 MG tablet Take 1 tablet (1,000 mg total) 2 (two) times daily with a meal by mouth. Must keep appt w/new provider for future refills 180 tablet 3  . montelukast (SINGULAIR) 10 MG tablet Take 1 tablet (10 mg total) by mouth at bedtime. 90 tablet 1  . Multiple Vitamin (MULTIVITAMIN WITH MINERALS) TABS tablet Take 1 tablet by mouth daily.    . tamsulosin (FLOMAX) 0.4 MG CAPS capsule Take 0.4 mg by mouth at bedtime.      No current facility-administered medications on file prior to visit.    Review of Systems  Constitutional: Negative for other unusual diaphoresis or sweats HENT: Negative for ear discharge or swelling Eyes: Negative for other worsening visual disturbances Respiratory: Negative for stridor or other swelling  Gastrointestinal: Negative for worsening distension or other  blood Genitourinary: Negative for retention or other urinary change Musculoskeletal: Negative for other MSK pain or swelling Skin: Negative for color change or other new lesions Neurological: Negative for worsening tremors and other numbness  Psychiatric/Behavioral: Negative for worsening agitation or other fatigue All other system neg per pt    Objective:   Physical Exam BP 122/82   Pulse 96   Temp 98.5 F (36.9 C) (Oral)   Ht 6' (1.829 m)   Wt 213 lb (96.6 kg)   SpO2 98%    BMI 28.89 kg/m  VS noted,  Constitutional: Pt appears in NAD HENT: Head: NCAT.  Right Ear: External ear normal.  Left Ear: External ear normal.  Eyes: . Pupils are equal, round, and reactive to light. Conjunctivae and EOM are normal Bilat tm's with mild erythema.  Max sinus areas mild tender.  Pharynx with mild erythema, no exudate Nose: without d/c or deformity Neck: Neck supple. Gross normal ROM Cardiovascular: Normal rate and regular rhythm.   Pulmonary/Chest: Effort normal and breath sounds decreased without rales or wheezing.  Neurological: Pt is alert. At baseline orientation, motor grossly intact Skin: Skin is warm. No rashes, other new lesions, no LE edema Psychiatric: Pt behavior is normal without agitation  No other exam findings    Assessment & Plan:

## 2017-03-31 NOTE — Assessment & Plan Note (Signed)
stable overall by history and exam, recent data reviewed with pt, and pt to continue medical treatment as before,  to f/u any worsening symptoms or concerns, for f/u a1c with labs 

## 2017-03-31 NOTE — Patient Instructions (Addendum)
Please take all new medication as prescribed - the antibiotic, and cough medicine  Please continue all other medications as before, and refills have been done if requested  - the steroid ointment  Please have the pharmacy call with any other refills you may need.  Please continue your efforts at being more active, low cholesterol diet, and weight control  Please keep your appointments with your specialists as you may have planned  We will try to forward your lab results to your Gastroenterologist  Please go to the LAB in the Basement (turn left off the elevator) for the tests to be done today  You will be contacted by phone if any changes need to be made immediately.  Otherwise, you will receive a letter about your results with an explanation, but please check with MyChart first.  Please remember to sign up for MyChart if you have not done so, as this will be important to you in the future with finding out test results, communicating by private email, and scheduling acute appointments online when needed.  Please return in 6 months, or sooner if needed, with Lab testing done 3-5 days before

## 2017-03-31 NOTE — Telephone Encounter (Signed)
Patient is requesting lab results to be sent to [facility]  Dr. Elissa Lovett Misenhimer MD.  [address] [place]  [address].

## 2017-03-31 NOTE — Assessment & Plan Note (Signed)
Mild to mod, c/w bronchitis vs pna, declines cxr, for antibx course, cough med prn,  to f/u any worsening symptoms or concerns 

## 2017-03-31 NOTE — Assessment & Plan Note (Signed)
Mild, ok for triam ointment prn refill

## 2017-04-03 ENCOUNTER — Telehealth: Payer: Self-pay

## 2017-04-03 NOTE — Telephone Encounter (Signed)
-----   Message from Biagio Borg, MD sent at 03/31/2017  7:13 PM EST ----- Left message on MyChart, pt to cont same tx except  The test results show that your current treatment is OK, except the A1c is mildly too high.  Please start an additional medication called glipizide ER 2.5 mg per day, as this will help bring down the sugar.  I will send a new prescription, and you should hear from the office as well.    Shirron to please inform pt, I will do rx

## 2017-04-03 NOTE — Telephone Encounter (Signed)
Done

## 2017-04-03 NOTE — Telephone Encounter (Signed)
Pt has been informed and expressed understanding.  

## 2017-04-12 ENCOUNTER — Other Ambulatory Visit: Payer: Self-pay | Admitting: Internal Medicine

## 2017-04-12 DIAGNOSIS — M159 Polyosteoarthritis, unspecified: Secondary | ICD-10-CM

## 2017-04-12 DIAGNOSIS — M150 Primary generalized (osteo)arthritis: Principal | ICD-10-CM

## 2017-04-12 NOTE — Telephone Encounter (Signed)
Copied from [place] [id number]. Topic: Quick Communication - See Telephone Encounter >> Apr 12, 2017  9:05 AM David Kelly, NT wrote: CRM for notification. See Telephone encounter for:  04/12/17.  Patient is calling and requesting a refill on HYDROcodone-acetaminophen (NORCO) 10-325 MG tablet. Please advise.  [place], [place]

## 2017-04-12 NOTE — Telephone Encounter (Signed)
LOV: 03/31/17  PCP: Dr. [name]  Pharmacy: [facility]

## 2017-04-13 MED ORDER — HYDROCODONE-ACETAMINOPHEN 10-325 MG PO TABS: 1.0000 | ORAL_TABLET | Freq: Three times a day (TID) | ORAL | 0 refills | Status: DC | PRN

## 2017-04-13 NOTE — Telephone Encounter (Signed)
Ok to let pt know, that I normally do not treat chronic pain. I can refill this time, but please ask if he anticipates further need for pain control, I can refer to orthopedic (if he is not already) for the knees or Pain Management.

## 2017-04-19 ENCOUNTER — Encounter: Payer: Medicare Other | Admitting: Physical Medicine & Rehabilitation

## 2017-04-28 ENCOUNTER — Encounter: Payer: Self-pay | Admitting: Sports Medicine

## 2017-04-28 ENCOUNTER — Ambulatory Visit (INDEPENDENT_AMBULATORY_CARE_PROVIDER_SITE_OTHER): Payer: Medicare Other | Admitting: Sports Medicine

## 2017-04-28 DIAGNOSIS — E119 Type 2 diabetes mellitus without complications: Secondary | ICD-10-CM | POA: Diagnosis not present

## 2017-04-28 DIAGNOSIS — M79676 Pain in unspecified toe(s): Secondary | ICD-10-CM | POA: Diagnosis not present

## 2017-04-28 DIAGNOSIS — B351 Tinea unguium: Secondary | ICD-10-CM | POA: Diagnosis not present

## 2017-04-28 NOTE — Progress Notes (Signed)
Subjective: David Kelly is a 72 y.o. male patient with history of diabetes who returns to office today complaining of long, painful nails  while ambulating in shoes; unable to trim. Patient states that the glucose reading yesterday was 161 mg/dl. Patient denies any new changes in medication or new problems. Patient denies any other pedal complaints at this time.  Saw primary care Dr. Cathlean Cower 4month ago.   Patient Active Problem List   Diagnosis Date Noted  . Cough 03/31/2017  . Primary osteoarthritis of right knee 03/15/2017  . Unspecified injury of lower back, sequela 03/15/2017  . Bilateral rotator cuff syndrome 03/15/2017  . Recurrent falls while walking 12/13/2016  . Preventative health care 12/13/2016  . Chronic pain 12/13/2016  . Kidney stones   . Glaucoma   . Gastrointestinal bleed 06/28/2016  . Dysuria 10/28/2015  . Itching of ear 08/13/2015  . Type 2 diabetes mellitus ([place]) 07/15/2015  . Osteoarthritis 07/15/2015   Current Outpatient Medications on File Prior to Visit  Medication Sig Dispense Refill  . aspirin EC 81 MG tablet Take 81 mg by mouth at bedtime.    Marland Kitchen atorvastatin (LIPITOR) 40 MG tablet Take 1 tablet (40 mg total) by mouth daily. 90 tablet 3  . azithromycin (ZITHROMAX Z-PAK) 250 MG tablet 2 tab by mouth day 1, then 1 per day 1 tablet 1  . brimonidine-timolol (COMBIGAN) 0.2-0.5 % ophthalmic solution Place 1 drop into both eyes every 12 (twelve) hours. 15 mL 1  . diclofenac sodium (VOLTAREN) 1 % GEL Apply 1 application topically 3 (three) times daily. Right knee and both shoulders. 3 Tube 4  . Fluocinolone Acetonide 0.01 % OIL Place 5 drops in ear(s) 2 (two) times daily. 20 mL 1  . glipiZIDE (GLUCOTROL XL) 2.5 MG 24 hr tablet Take 1 tablet (2.5 mg total) by mouth daily with breakfast. 90 tablet 3  . glucose blood (IGLUCOSE TEST STRIPS) test strip Use as instructed 100 each 12  . HYDROcodone-acetaminophen (NORCO) 10-325 MG tablet Take 1 tablet by mouth every 8  (eight) hours as needed. 90 tablet 0  . metFORMIN (GLUCOPHAGE) 1000 MG tablet Take 1 tablet (1,000 mg total) 2 (two) times daily with a meal by mouth. Must keep appt w/new provider for future refills 180 tablet 3  . montelukast (SINGULAIR) 10 MG tablet Take 1 tablet (10 mg total) by mouth at bedtime. 90 tablet 1  . Multiple Vitamin (MULTIVITAMIN WITH MINERALS) TABS tablet Take 1 tablet by mouth daily.    . tamsulosin (FLOMAX) 0.4 MG CAPS capsule Take 0.4 mg by mouth at bedtime.     . triamcinolone ointment (KENALOG) 0.5 % Apply 1 application topically 2 (two) times daily. 30 g 1   No current facility-administered medications on file prior to visit.    No Known Allergies  Recent Results (from the past 2160 hour(s))  CBC with Differential/Platelet     Status: None   Collection Time: 03/31/17  2:54 PM  Result Value Ref Range   WBC 8.3 4.0 - 10.5 K/uL   RBC 4.96 4.22 - 5.81 Mil/uL   Hemoglobin 13.5 13.0 - 17.0 g/dL   HCT 41.5 39.0 - 52.0 %   MCV 83.6 78.0 - 100.0 fl   MCHC 32.4 30.0 - 36.0 g/dL   RDW 14.0 11.5 - 15.5 %   Platelets 272.0 150.0 - 400.0 K/uL   Neutrophils Relative % 66.7 43.0 - 77.0 %   Lymphocytes Relative 23.4 12.0 - 46.0 %   Monocytes Relative  6.6 3.0 - 12.0 %   Eosinophils Relative 2.9 0.0 - 5.0 %   Basophils Relative 0.4 0.0 - 3.0 %   Neutro Abs 5.5 1.4 - 7.7 K/uL   Lymphs Abs 1.9 0.7 - 4.0 K/uL   Monocytes Absolute 0.5 0.1 - 1.0 K/uL   Eosinophils Absolute 0.2 0.0 - 0.7 K/uL   Basophils Absolute 0.0 0.0 - 0.1 K/uL  Hepatic function panel     Status: None   Collection Time: 03/31/17  2:54 PM  Result Value Ref Range   Total Bilirubin 0.3 0.2 - 1.2 mg/dL   Bilirubin, Direct 0.0 0.0 - 0.3 mg/dL   Alkaline Phosphatase 61 39 - 117 U/L   AST 21 0 - 37 U/L   ALT 25 0 - 53 U/L   Total Protein 7.5 6.0 - 8.3 g/dL   Albumin 4.5 3.5 - 5.2 g/dL  Basic metabolic panel     Status: Abnormal   Collection Time: 03/31/17  2:54 PM  Result Value Ref Range   Sodium 140 135 -  145 mEq/L   Potassium 3.9 3.5 - 5.1 mEq/L   Chloride 105 96 - 112 mEq/L   CO2 25 19 - 32 mEq/L   Glucose, Bld 180 (H) 70 - 99 mg/dL   BUN 12 6 - 23 mg/dL   Creatinine, Ser 0.80 0.40 - 1.50 mg/dL   Calcium 9.0 8.4 - 10.5 mg/dL   GFR 122.20 >60.00 mL/min  Lipid panel     Status: Abnormal   Collection Time: 03/31/17  2:54 PM  Result Value Ref Range   Cholesterol 139 0 - 200 mg/dL    Comment: ATP III Classification       Desirable:  < 200 mg/dL               Borderline High:  200 - 239 mg/dL          High:  > = 240 mg/dL   Triglycerides 224.0 (H) 0.0 - 149.0 mg/dL    Comment: Normal:  <150 mg/dLBorderline High:  150 - 199 mg/dL   HDL 25.30 (L) >39.00 mg/dL   VLDL 44.8 (H) 0.0 - 40.0 mg/dL   Total CHOL/HDL Ratio 5     Comment:                Men          Women1/2 Average Risk     3.4          3.3Average Risk          5.0          4.42X Average Risk          9.6          7.13X Average Risk          15.0          11.0                       NonHDL 113.70     Comment: NOTE:  Non-HDL goal should be 30 mg/dL higher than patient's LDL goal (i.e. LDL goal of < 70 mg/dL, would have non-HDL goal of < 100 mg/dL)  Hemoglobin A1c     Status: Abnormal   Collection Time: 03/31/17  2:54 PM  Result Value Ref Range   Hgb A1c MFr Bld 8.6 (H) 4.6 - 6.5 %    Comment: Glycemic Control Guidelines for People with Diabetes:Non Diabetic:  <6%Goal of Therapy: <7%Additional Action Suggested:  >  8%   LDL cholesterol, direct     Status: None   Collection Time: 03/31/17  2:54 PM  Result Value Ref Range   Direct LDL 86.0 mg/dL    Comment: Optimal:  <100 mg/dLNear or Above Optimal:  100-129 mg/dLBorderline High:  130-159 mg/dLHigh:  160-189 mg/dLVery High:  >190 mg/dL    Objective: General: Patient is awake, alert, and oriented x 3 and in no acute distress.  Integument: Skin is warm, dry and supple bilateral. Nails are tender, long, thickened and dystrophic with subungual debris, consistent with onychomycosis, 1-5  bilateral. No signs of infection. No open lesions or preulcerative lesions present bilateral. Remaining integument unremarkable.  Vasculature:  Dorsalis Pedis pulse 2/4 bilateral. Posterior Tibial pulse 1 /4 bilateral. Capillary fill time <3 sec 1-5 bilateral. Positive hair growth to the level of the digits.Temperature gradient within normal limits. No varicosities present bilateral. No edema present bilateral.   Neurology: The patient has intact sensation measured with a 5.07/10g Semmes Weinstein Monofilament at all pedal sites bilateral . Vibratory sensation diminished bilateral with tuning fork. No Babinski sign present bilateral.   Musculoskeletal: Asymptomatic bunion and hammertoe pedal deformities noted bilateral. Muscular strength 5/5 in all lower extremity muscular groups bilateral without pain on range of motion. No tenderness with calf compression bilateral.  Assessment and Plan: Problem List Items Addressed This Visit    None    Visit Diagnoses    Pain due to onychomycosis of toenail    -  Primary   Diabetes mellitus without complication ([place])         -Examined patient. -Discussed and educated patient on diabetic foot care, especially with  regards to the vascular, neurological and musculoskeletal systems.  -Stressed the importance of good glycemic control and the detriment of not  controlling glucose levels in relation to the foot. -Mechanically debrided all nails 1-5 bilateral using sterile nail nipper and filed with dremel without incident  -Answered all patient questions -Patient to return  in 3 months for at risk foot care -Patient advised to call the office if any problems or questions arise in the meantime.  Landis Martins, DPM

## 2017-05-11 ENCOUNTER — Other Ambulatory Visit: Payer: Self-pay | Admitting: Internal Medicine

## 2017-05-11 ENCOUNTER — Other Ambulatory Visit: Payer: Self-pay | Admitting: *Deleted

## 2017-05-11 DIAGNOSIS — M150 Primary generalized (osteo)arthritis: Principal | ICD-10-CM

## 2017-05-11 DIAGNOSIS — K5900 Constipation, unspecified: Secondary | ICD-10-CM | POA: Diagnosis not present

## 2017-05-11 DIAGNOSIS — R51 Headache: Secondary | ICD-10-CM | POA: Diagnosis not present

## 2017-05-11 DIAGNOSIS — M159 Polyosteoarthritis, unspecified: Secondary | ICD-10-CM

## 2017-05-11 MED ORDER — HYDROCODONE-ACETAMINOPHEN 10-325 MG PO TABS: 1.0000 | ORAL_TABLET | Freq: Three times a day (TID) | ORAL | 0 refills | Status: DC | PRN

## 2017-05-11 MED ORDER — GLUCOSE BLOOD VI STRP: ORAL_STRIP | 12 refills | Status: DC

## 2017-05-11 MED ORDER — BRIMONIDINE TARTRATE-TIMOLOL 0.2-0.5 % OP SOLN: 1.0000 [drp] | Freq: Two times a day (BID) | OPHTHALMIC | 1 refills | Status: DC

## 2017-05-11 NOTE — Telephone Encounter (Signed)
Copied from [place] [id number]. Topic: Quick Communication - Rx Refill/Question >> May 11, 2017  9:11 AM Cleaster Corin, NT wrote: Medication: HYDROcodone-acetaminophen (NORCO) 10-325 MG tablet [[id number]]  glucose blood (IGLUCOSE TEST STRIPS) test strip [[id number]] and lancets  brimonidine-timolol (COMBIGAN) 0.2-0.5 % ophthalmic solution [[id number]]   Has the patient contacted their pharmacy?yes (Agent: If no, request that the patient contact the pharmacy for the refill.) Preferred Pharmacy (with phone number or street name): [facility] [place] [facility] [address] [place] [address] Phone: [phone] Fax: [phone]   Agent: Please be advised that RX refills may take up to 3 business days. We ask that you follow-up with your pharmacy.

## 2017-05-11 NOTE — Telephone Encounter (Signed)
Done erx 

## 2017-05-11 NOTE — Telephone Encounter (Signed)
Test strips and ophthalmic solution refilled per office protocol.   Rx refill request: Norco 10-325 mg  LOV: 03/31/17  PCP: Banks: verified

## 2017-06-08 ENCOUNTER — Other Ambulatory Visit: Payer: Self-pay | Admitting: Internal Medicine

## 2017-06-08 DIAGNOSIS — M159 Polyosteoarthritis, unspecified: Secondary | ICD-10-CM

## 2017-06-08 DIAGNOSIS — M150 Primary generalized (osteo)arthritis: Principal | ICD-10-CM

## 2017-06-08 NOTE — Telephone Encounter (Signed)
Done erx 

## 2017-06-08 NOTE — Telephone Encounter (Signed)
05/11/2017 90# 

## 2017-07-07 ENCOUNTER — Other Ambulatory Visit: Payer: Self-pay | Admitting: Internal Medicine

## 2017-07-07 DIAGNOSIS — M150 Primary generalized (osteo)arthritis: Principal | ICD-10-CM

## 2017-07-07 DIAGNOSIS — M159 Polyosteoarthritis, unspecified: Secondary | ICD-10-CM

## 2017-07-07 NOTE — Telephone Encounter (Signed)
LOV w/you: 03/31/17 Next OV: 09/28/17 Ok to Rf?

## 2017-07-07 NOTE — Telephone Encounter (Signed)
Done erx 

## 2017-08-04 ENCOUNTER — Encounter: Payer: Self-pay | Admitting: Sports Medicine

## 2017-08-04 ENCOUNTER — Ambulatory Visit (INDEPENDENT_AMBULATORY_CARE_PROVIDER_SITE_OTHER): Payer: Medicare Other | Admitting: Sports Medicine

## 2017-08-04 VITALS — BP 113/74 | HR 93 | Temp 97.8°F | Resp 14

## 2017-08-04 DIAGNOSIS — B351 Tinea unguium: Secondary | ICD-10-CM | POA: Diagnosis not present

## 2017-08-04 DIAGNOSIS — M79676 Pain in unspecified toe(s): Secondary | ICD-10-CM | POA: Diagnosis not present

## 2017-08-04 DIAGNOSIS — E119 Type 2 diabetes mellitus without complications: Secondary | ICD-10-CM | POA: Diagnosis not present

## 2017-08-04 NOTE — Progress Notes (Signed)
Subjective: David Kelly is a 72 y.o. male patient with history of diabetes who returns to office today complaining of long, painful nails  while ambulating in shoes; unable to trim. Patient states that the glucose reading yesterday was 121mg /dl. Patient denies any new changes in medication or new problems. Patient denies any other pedal complaints at this time.  Saw primary care Dr. Cathlean Cower 3 months ago.   Patient Active Problem List   Diagnosis Date Noted  . Cough 03/31/2017  . Primary osteoarthritis of right knee 03/15/2017  . Unspecified injury of lower back, sequela 03/15/2017  . Bilateral rotator cuff syndrome 03/15/2017  . Recurrent falls while walking 12/13/2016  . Preventative health care 12/13/2016  . Chronic pain 12/13/2016  . Kidney stones   . Glaucoma   . Gastrointestinal bleed 06/28/2016  . Dysuria 10/28/2015  . Itching of ear 08/13/2015  . Type 2 diabetes mellitus ([place]) 07/15/2015  . Osteoarthritis 07/15/2015   Current Outpatient Medications on File Prior to Visit  Medication Sig Dispense Refill  . aspirin EC 81 MG tablet Take 81 mg by mouth at bedtime.    Marland Kitchen atorvastatin (LIPITOR) 40 MG tablet Take 1 tablet (40 mg total) by mouth daily. 90 tablet 3  . azithromycin (ZITHROMAX Z-PAK) 250 MG tablet 2 tab by mouth day 1, then 1 per day 1 tablet 1  . brimonidine-timolol (COMBIGAN) 0.2-0.5 % ophthalmic solution Place 1 drop into both eyes every 12 (twelve) hours. 15 mL 1  . diclofenac sodium (VOLTAREN) 1 % GEL Apply 1 application topically 3 (three) times daily. Right knee and both shoulders. 3 Tube 4  . Fluocinolone Acetonide 0.01 % OIL Place 5 drops in ear(s) 2 (two) times daily. 20 mL 1  . glipiZIDE (GLUCOTROL XL) 2.5 MG 24 hr tablet Take 1 tablet (2.5 mg total) by mouth daily with breakfast. 90 tablet 3  . glucose blood (IGLUCOSE TEST STRIPS) test strip Use as instructed 100 each 12  . HYDROcodone-acetaminophen (NORCO) 10-325 MG tablet TAKE 1 TABLET BY MOUTH  EVERY 8 HOURS AS NEEDED 90 tablet 0  . metFORMIN (GLUCOPHAGE) 1000 MG tablet Take 1 tablet (1,000 mg total) 2 (two) times daily with a meal by mouth. Must keep appt w/new provider for future refills 180 tablet 3  . montelukast (SINGULAIR) 10 MG tablet Take 1 tablet (10 mg total) by mouth at bedtime. 90 tablet 1  . Multiple Vitamin (MULTIVITAMIN WITH MINERALS) TABS tablet Take 1 tablet by mouth daily.    . tamsulosin (FLOMAX) 0.4 MG CAPS capsule Take 0.4 mg by mouth at bedtime.     . triamcinolone ointment (KENALOG) 0.5 % Apply 1 application topically 2 (two) times daily. 30 g 1   No current facility-administered medications on file prior to visit.    No Known Allergies  No results found for this or any previous visit (from the past 2160 hour(s)).  Objective: General: Patient is awake, alert, and oriented x 3 and in no acute distress. No acute changes with physical exam  Integument: Skin is warm, dry and supple bilateral. Nails are tender, long, thickened and dystrophic with subungual debris, consistent with onychomycosis, 1-5 bilateral. No signs of infection. No open lesions or preulcerative lesions present bilateral. Remaining integument unremarkable.  Vasculature:  Dorsalis Pedis pulse 2/4 bilateral. Posterior Tibial pulse 1 /4 bilateral. Capillary fill time <3 sec 1-5 bilateral. Positive hair growth to the level of the digits.Temperature gradient within normal limits. No varicosities present bilateral. No edema present bilateral.  Neurology: The patient has intact sensation measured with a 5.07/10g Semmes Weinstein Monofilament at all pedal sites bilateral . Vibratory sensation diminished bilateral with tuning fork. No Babinski sign present bilateral.   Musculoskeletal: Asymptomatic bunion and hammertoe pedal deformities noted bilateral. Muscular strength 5/5 in all lower extremity muscular groups bilateral without pain on range of motion. No tenderness with calf compression  bilateral.  Assessment and Plan: Problem List Items Addressed This Visit    None    Visit Diagnoses    Pain due to onychomycosis of toenail    -  Primary   Diabetes mellitus without complication ([place])         -Examined patient. -Discussed and educated patient on diabetic foot care, especially with  regards to the vascular, neurological and musculoskeletal systems.  -Stressed the importance of good glycemic control and the detriment of not  controlling glucose levels in relation to the foot. -Mechanically debrided all nails 1-5 bilateral using sterile nail nipper and filed with dremel without incident  -Patient to return  in 3 months for at risk foot care -Patient advised to call the office if any problems or questions arise in the meantime.  Landis Martins, DPM

## 2017-08-07 ENCOUNTER — Other Ambulatory Visit: Payer: Self-pay | Admitting: Internal Medicine

## 2017-08-07 DIAGNOSIS — E119 Type 2 diabetes mellitus without complications: Secondary | ICD-10-CM | POA: Diagnosis not present

## 2017-08-07 DIAGNOSIS — M159 Polyosteoarthritis, unspecified: Secondary | ICD-10-CM

## 2017-08-07 DIAGNOSIS — M150 Primary generalized (osteo)arthritis: Principal | ICD-10-CM

## 2017-08-07 NOTE — Telephone Encounter (Signed)
07/07/2017 90# 

## 2017-08-07 NOTE — Telephone Encounter (Signed)
Ok to let pt know I refilled his pain medication, but I can only do this 1 or 2 more times.  Since he seems to need this on a regular basis, and I dont practice chronic pain management, I will refer to Pain clinic,

## 2017-08-08 LAB — HM DIABETES EYE EXAM

## 2017-09-02 ENCOUNTER — Other Ambulatory Visit: Payer: Self-pay | Admitting: Family

## 2017-09-06 ENCOUNTER — Other Ambulatory Visit: Payer: Self-pay | Admitting: Internal Medicine

## 2017-09-06 DIAGNOSIS — M159 Polyosteoarthritis, unspecified: Secondary | ICD-10-CM

## 2017-09-06 DIAGNOSIS — M150 Primary generalized (osteo)arthritis: Principal | ICD-10-CM

## 2017-09-06 NOTE — Telephone Encounter (Signed)
08/07/2017 90# 

## 2017-09-06 NOTE — Telephone Encounter (Signed)
Done erx  Pain clinic referral pending

## 2017-09-27 NOTE — Progress Notes (Addendum)
Subjective:   David Kelly is a 72 y.o. male who presents for an Initial Medicare Annual Wellness Visit.  Review of Systems  No ROS.  Medicare Wellness Visit. Additional risk factors are reflected in the social history.  Cardiac Risk Factors include: advanced age (>16men, >33 women);diabetes mellitus;dyslipidemia;hypertension;male gender Sleep patterns: feels rested on waking, gets up 1 times nightly to void and sleeps 7-8 hours nightly.    Home Safety/Smoke Alarms: Feels safe in home. Smoke alarms in place.  Living environment; residence and Firearm Safety: 1-story house/ trailer, no firearms. Lives alone,needs tub bench DME, limited support system Seat Belt Safety/Bike Helmet: Wears seat belt.    Objective:    Today's Vitals   09/28/17 1345 09/28/17 1648  BP: 120/76   Pulse: 75   SpO2: 95%   Weight: 201 lb (91.2 kg)   Height: 6' (1.829 m)   PainSc:  3    Body mass index is 27.26 kg/m.  Advanced Directives 09/28/2017 05/20/2016 05/17/2016 04/04/2011  Does Patient Have a Medical Advance Directive? No No No Patient does not have advance directive  Would patient like information on creating a medical advance directive? Yes (ED - Information included in AVS) No - Patient declined - -    Current Medications (verified) Outpatient Encounter Medications as of 09/28/2017  Medication Sig  . aspirin EC 81 MG tablet Take 81 mg by mouth at bedtime.  Marland Kitchen atorvastatin (LIPITOR) 40 MG tablet Take 1 tablet (40 mg total) by mouth daily.  . brimonidine-timolol (COMBIGAN) 0.2-0.5 % ophthalmic solution Place 1 drop into both eyes every 12 (twelve) hours.  . diclofenac sodium (VOLTAREN) 1 % GEL Apply 1 application topically 3 (three) times daily. Right knee and both shoulders.  . Fluocinolone Acetonide 0.01 % OIL Place 5 drops in ear(s) 2 (two) times daily.  Marland Kitchen glipiZIDE (GLUCOTROL XL) 2.5 MG 24 hr tablet Take 1 tablet (2.5 mg total) by mouth daily with breakfast.  . glucose blood (IGLUCOSE TEST  STRIPS) test strip Use as instructed  . HYDROcodone-acetaminophen (NORCO) 10-325 MG tablet TAKE 1 TABLET BY MOUTH EVERY 8 HOURS AS NEEDED  . metFORMIN (GLUCOPHAGE) 1000 MG tablet Take 1 tablet (1,000 mg total) by mouth 2 (two) times daily with a meal. Must keep appt w/new provider for future refills  . montelukast (SINGULAIR) 10 MG tablet Take 1 tablet (10 mg total) by mouth at bedtime.  . Multiple Vitamin (MULTIVITAMIN WITH MINERALS) TABS tablet Take 1 tablet by mouth daily.  Marland Kitchen triamcinolone ointment (KENALOG) 0.5 % Apply 1 application topically 2 (two) times daily.  . [DISCONTINUED] atorvastatin (LIPITOR) 40 MG tablet Take 1 tablet (40 mg total) by mouth daily.  . [DISCONTINUED] azithromycin (ZITHROMAX Z-PAK) 250 MG tablet 2 tab by mouth day 1, then 1 per day  . [DISCONTINUED] glipiZIDE (GLUCOTROL XL) 2.5 MG 24 hr tablet Take 1 tablet (2.5 mg total) by mouth daily with breakfast.  . [DISCONTINUED] metFORMIN (GLUCOPHAGE) 1000 MG tablet Take 1 tablet (1,000 mg total) 2 (two) times daily with a meal by mouth. Must keep appt w/new provider for future refills  . [DISCONTINUED] montelukast (SINGULAIR) 10 MG tablet Take 1 tablet (10 mg total) by mouth at bedtime.  . [DISCONTINUED] tamsulosin (FLOMAX) 0.4 MG CAPS capsule Take 0.4 mg by mouth at bedtime.    No facility-administered encounter medications on file as of 09/28/2017.     Allergies (verified) Patient has no known allergies.   History: Past Medical History:  Diagnosis Date  . Arthritis   .  BPH (benign prostatic hypertrophy)   . Diabetes mellitus    DX  5 YR AGO  . Glaucoma   . HLD (hyperlipidemia) 09/28/2017  . Kidney stones    Past Surgical History:  Procedure Laterality Date  . CYSTOSCOPY WITH RETROGRADE PYELOGRAM, URETEROSCOPY AND STENT PLACEMENT Right 05/20/2016   Procedure: CYSTOSCOPY, URETEROSCOPY, TRANSURETHRAL INCISION OF URETHROCELE, BASKET STONE REMOVAL;  Surgeon: Irine Seal, MD;  Location: [facility];  Service: Urology;   Laterality: Right;  . left total knee replacement    . TOTAL KNEE ARTHROPLASTY  04/04/2011   Procedure: TOTAL KNEE ARTHROPLASTY;  Surgeon: Rudean Haskell, MD;  Location: [facility];  Service: Orthopedics;  Laterality: Left;   Family History  Problem Relation Age of Onset  . Arthritis Mother   . Asthma Paternal Grandfather   . Diabetes Sister    Social History   Socioeconomic History  . Marital status: Single    Spouse name: Not on file  . Number of children: 4  . Years of education: 67  . Highest education level: Not on file  Occupational History  . Not on file  Social Needs  . Financial resource strain: Not hard at all  . Food insecurity:    Worry: Never true    Inability: Never true  . Transportation needs:    Medical: No    Non-medical: No  Tobacco Use  . Smoking status: Former Smoker    Packs/day: 1.50    Years: 45.00    Pack years: 67.50    Types: Cigarettes    Last attempt to quit: 03/21/2006    Years since quitting: 11.5  . Smokeless tobacco: Current User    Types: Chew  Substance and Sexual Activity  . Alcohol use: No  . Drug use: No  . Sexual activity: Not Currently  Lifestyle  . Physical activity:    Days per week: 4 days    Minutes per session: 50 min  . Stress: Not at all  Relationships  . Social connections:    Talks on phone: More than three times a week    Gets together: More than three times a week    Attends religious service: Never    Active member of club or organization: Yes    Attends meetings of clubs or organizations: More than 4 times per year    Relationship status: Not on file  Other Topics Concern  . Not on file  Social History Narrative   Fun: Fish and go to the park   Tobacco Counseling Ready to quit: Not Answered Counseling given: Not Answered  Activities of Daily Living In your present state of health, do you have any difficulty performing the following activities: 09/28/2017  Hearing? N  Vision? N  Difficulty concentrating  or making decisions? N  Walking or climbing stairs? N  Dressing or bathing? N  Doing errands, shopping? N  Preparing Food and eating ? N  Using the Toilet? N  In the past six months, have you accidently leaked urine? N  Do you have problems with loss of bowel control? N  Managing your Medications? N  Managing your Finances? N  Housekeeping or managing your Housekeeping? N  Some recent data might be hidden     Immunizations and Health Maintenance Immunization History  Administered Date(s) Administered  . Influenza, High Dose Seasonal PF 10/28/2015   Health Maintenance Due  Topic Date Due  . HEMOGLOBIN A1C  09/28/2017    Patient Care Team: Biagio Borg, MD  as PCP - General (Internal Medicine) Landis Martins, DPM as Consulting Physician (Podiatry) Irine Seal, MD as Attending Physician (Urology)  Indicate any recent Medical Services you may have received from other than [facility] providers in the past year (date may be approximate).    Assessment:   This is a routine wellness examination for [employer]. Physical assessment deferred to PCP.   Hearing/Vision screen Hearing Screening Comments: Able to hear conversational tones w/o difficulty. No issues reported.  Passed whisper test Vision Screening Comments: appointment yearly [facility]  Dietary issues and exercise activities discussed: Current Exercise Habits: Home exercise routine, Type of exercise: walking, Time (Minutes): 50, Frequency (Times/Week): 6, Weekly Exercise (Minutes/Week): 300, Intensity: Mild, Exercise limited by: orthopedic condition(s)  Diet (meal preparation, eat out, water intake, caffeinated beverages, dairy products, fruits and vegetables): in general, a "healthy" diet  , well balanced   Reviewed heart healthy and diabetic diet. Encouraged patient to increase daily water and healthy fluid intake.Diet education was attached to patient's AVS.  Goals    . Patient Stated     Stay as healthy and as  independent as possible. Continue to exercise, eat healthy, and enjoy life and church.      Depression Screen PHQ 2/9 Scores 09/28/2017 03/31/2017 12/13/2016  PHQ - 2 Score 0 0 3  PHQ- 9 Score - - 8    Fall Risk Fall Risk  09/28/2017 03/31/2017 03/15/2017 12/13/2016  Falls in the past year? Yes No Yes Yes  Comment - - last fall December 2018 -  Number falls in past yr: 1 - 2 or more 2 or more  Injury with Fall? No - Yes No   Cognitive Function: MMSE - Mini Mental State Exam 09/28/2017  Orientation to time 5  Orientation to Place 5  Registration 3  Attention/ Calculation 3  Recall 1  Language- name 2 objects 2  Language- repeat 1  Language- follow 3 step command 3  Language- read & follow direction 1  Write a sentence 1  Copy design 1  Total score 26        Screening Tests Health Maintenance  Topic Date Due  . HEMOGLOBIN A1C  09/28/2017  . INFLUENZA VACCINE  11/15/2017 (Originally 09/14/2017)  . TETANUS/TDAP  09/29/2018 (Originally 03/19/1964)  . PNA vac Low Risk Adult (1 of 2 - PCV13) 09/29/2018 (Originally 03/19/2010)  . FOOT EXAM  12/13/2017  . URINE MICROALBUMIN  12/13/2017  . OPHTHALMOLOGY EXAM  08/09/2018  . COLONOSCOPY  01/28/2019  . Hepatitis C Screening  Completed      Plan:     Continue doing brain stimulating activities (puzzles, reading, adult coloring books, staying active) to keep memory sharp.   Continue to eat heart healthy diet (full of fruits, vegetables, whole grains, lean protein, water--limit salt, fat, and sugar intake) and increase physical activity as tolerated.   I have personally reviewed and noted the following in the patient's chart:   . Medical and social history . Use of alcohol, tobacco or illicit drugs  . Current medications and supplements . Functional ability and status . Nutritional status . Physical activity . Advanced directives . List of other physicians . Vitals . Screenings to include cognitive, depression, and  falls . Referrals and appointments  In addition, I have reviewed and discussed with patient certain preventive protocols, quality metrics, and best practice recommendations. A written personalized care plan for preventive services as well as general preventive health recommendations were provided to patient.     Sharee Pimple  A Wine, RN   09/28/2017      Medical screening examination/treatment/procedure(s) were performed by non-physician practitioner and as supervising physician I was immediately available for consultation/collaboration. I agree with above. Cathlean Cower, MD

## 2017-09-28 ENCOUNTER — Ambulatory Visit (INDEPENDENT_AMBULATORY_CARE_PROVIDER_SITE_OTHER): Payer: Medicare Other | Admitting: Internal Medicine

## 2017-09-28 ENCOUNTER — Encounter: Payer: Self-pay | Admitting: Internal Medicine

## 2017-09-28 ENCOUNTER — Ambulatory Visit (INDEPENDENT_AMBULATORY_CARE_PROVIDER_SITE_OTHER): Payer: Medicare Other | Admitting: *Deleted

## 2017-09-28 ENCOUNTER — Other Ambulatory Visit (INDEPENDENT_AMBULATORY_CARE_PROVIDER_SITE_OTHER): Payer: Medicare Other

## 2017-09-28 VITALS — BP 120/76 | HR 75 | Temp 98.0°F | Ht 72.0 in | Wt 201.0 lb

## 2017-09-28 VITALS — BP 120/76 | HR 75 | Ht 72.0 in | Wt 201.0 lb

## 2017-09-28 DIAGNOSIS — Z0000 Encounter for general adult medical examination without abnormal findings: Secondary | ICD-10-CM

## 2017-09-28 DIAGNOSIS — E119 Type 2 diabetes mellitus without complications: Secondary | ICD-10-CM | POA: Diagnosis not present

## 2017-09-28 DIAGNOSIS — G8929 Other chronic pain: Secondary | ICD-10-CM | POA: Diagnosis not present

## 2017-09-28 DIAGNOSIS — Z9181 History of falling: Secondary | ICD-10-CM

## 2017-09-28 DIAGNOSIS — E785 Hyperlipidemia, unspecified: Secondary | ICD-10-CM | POA: Diagnosis not present

## 2017-09-28 HISTORY — DX: Hyperlipidemia, unspecified: E78.5

## 2017-09-28 LAB — URINALYSIS, ROUTINE W REFLEX MICROSCOPIC
BILIRUBIN URINE: NEGATIVE
HGB URINE DIPSTICK: NEGATIVE
Ketones, ur: NEGATIVE
LEUKOCYTES UA: NEGATIVE
NITRITE: NEGATIVE
RBC / HPF: NONE SEEN (ref 0–?)
Specific Gravity, Urine: >=1.03 — AB (ref 1.000–1.030)
Total Protein, Urine: NEGATIVE
Urine Glucose: NEGATIVE
Urobilinogen, UA: 0.2 (ref 0.0–1.0)
pH: 5 (ref 5.0–8.0)

## 2017-09-28 LAB — HEPATIC FUNCTION PANEL
ALBUMIN: 4.7 g/dL (ref 3.5–5.2)
ALK PHOS: 57 U/L (ref 39–117)
ALT: 22 U/L (ref 0–53)
AST: 20 U/L (ref 0–37)
Bilirubin, Direct: 0.1 mg/dL (ref 0.0–0.3)
TOTAL PROTEIN: 7.6 g/dL (ref 6.0–8.3)
Total Bilirubin: 0.6 mg/dL (ref 0.2–1.2)

## 2017-09-28 LAB — LIPID PANEL
Cholesterol: 270 mg/dL — ABNORMAL HIGH (ref 0–200)
HDL: 33.3 mg/dL — ABNORMAL LOW (ref >39.00)
LDL CALC: 200 mg/dL — AB (ref 0–99)
NonHDL: 237
Total CHOL/HDL Ratio: 8
Triglycerides: 183 mg/dL — ABNORMAL HIGH (ref 0.0–149.0)
VLDL: 36.6 mg/dL (ref 0.0–40.0)

## 2017-09-28 LAB — BASIC METABOLIC PANEL
BUN: 13 mg/dL (ref 6–23)
CHLORIDE: 104 meq/L (ref 96–112)
CO2: 28 mEq/L (ref 19–32)
CREATININE: 0.77 mg/dL (ref 0.40–1.50)
Calcium: 9.8 mg/dL (ref 8.4–10.5)
GFR: 127.53 mL/min (ref >60.00)
Glucose, Bld: 115 mg/dL — ABNORMAL HIGH (ref 70–99)
POTASSIUM: 4.4 meq/L (ref 3.5–5.1)
Sodium: 139 mEq/L (ref 135–145)

## 2017-09-28 LAB — CBC WITH DIFFERENTIAL/PLATELET
BASOS PCT: 0.3 % (ref 0.0–3.0)
Basophils Absolute: 0 10*3/uL (ref 0.0–0.1)
EOS PCT: 2.1 % (ref 0.0–5.0)
Eosinophils Absolute: 0.2 10*3/uL (ref 0.0–0.7)
HEMATOCRIT: 41.5 % (ref 39.0–52.0)
Hemoglobin: 13.4 g/dL (ref 13.0–17.0)
LYMPHS PCT: 17.9 % (ref 12.0–46.0)
Lymphs Abs: 1.5 10*3/uL (ref 0.7–4.0)
MCHC: 32.3 g/dL (ref 30.0–36.0)
MCV: 83.9 fl (ref 78.0–100.0)
Monocytes Absolute: 0.4 10*3/uL (ref 0.1–1.0)
Monocytes Relative: 5 % (ref 3.0–12.0)
NEUTROS ABS: 6.2 10*3/uL (ref 1.4–7.7)
Neutrophils Relative %: 74.7 % (ref 43.0–77.0)
PLATELETS: 265 10*3/uL (ref 150.0–400.0)
RBC: 4.95 Mil/uL (ref 4.22–5.81)
RDW: 14.6 % (ref 11.5–15.5)
WBC: 8.3 10*3/uL (ref 4.0–10.5)

## 2017-09-28 LAB — MICROALBUMIN / CREATININE URINE RATIO
Creatinine,U: 129 mg/dL
MICROALB UR: 1.9 mg/dL (ref 0.0–1.9)
Microalb Creat Ratio: 1.5 mg/g (ref 0.0–30.0)

## 2017-09-28 LAB — HEMOGLOBIN A1C: Hgb A1c MFr Bld: 7.4 % — ABNORMAL HIGH (ref 4.6–6.5)

## 2017-09-28 LAB — TSH: TSH: 0.77 u[IU]/mL (ref 0.35–4.50)

## 2017-09-28 LAB — PSA: PSA: 0.75 ng/mL (ref 0.10–4.00)

## 2017-09-28 MED ORDER — TAMSULOSIN HCL 0.4 MG PO CAPS: 0.4000 mg | ORAL_CAPSULE | Freq: Every day | ORAL | 3 refills | Status: DC

## 2017-09-28 MED ORDER — METFORMIN HCL 1000 MG PO TABS: 1000.0000 mg | ORAL_TABLET | Freq: Two times a day (BID) | ORAL | 3 refills | Status: DC

## 2017-09-28 MED ORDER — GLIPIZIDE ER 2.5 MG PO TB24: 2.5000 mg | ORAL_TABLET | Freq: Every day | ORAL | 3 refills | Status: DC

## 2017-09-28 MED ORDER — ATORVASTATIN CALCIUM 40 MG PO TABS: 40.0000 mg | ORAL_TABLET | Freq: Every day | ORAL | 3 refills | Status: DC

## 2017-09-28 MED ORDER — MONTELUKAST SODIUM 10 MG PO TABS: 10.0000 mg | ORAL_TABLET | Freq: Every day | ORAL | 3 refills | Status: DC

## 2017-09-28 NOTE — Assessment & Plan Note (Signed)
stable overall by history and exam, recent data reviewed with pt, and pt to continue medical treatment as before,  to f/u any worsening symptoms or concerns, for f/u a1c with labs 

## 2017-09-28 NOTE — Assessment & Plan Note (Signed)
Knees and shoulder bilat, for f/u sports medicine in this office

## 2017-09-28 NOTE — Progress Notes (Signed)
Subjective:    Patient ID: David Kelly, male    DOB: 1945/10/03, 72 y.o.   MRN: [id number]  HPI  Here for wellness and f/u;  Overall doing ok;  Pt denies Chest pain, worsening SOB, DOE, wheezing, orthopnea, PND, worsening LE edema, palpitations,  or syncope.  Pt denies neurological change such as new headache, facial or extremity weakness.  Pt denies polydipsia, polyuria, or low sugar symptoms. Pt states overall good compliance with treatment and medications, good tolerability, and has been trying to follow appropriate diet.  Pt denies worsening depressive symptoms, suicidal ideation or panic. No fever, night sweats, wt loss, loss of appetite, or other constitutional symptoms.  Pt states good ability with ADL's, has low fall risk, home safety reviewed and adequate, no other significant changes in hearing or vision, and only occasionally active with exercise. Has to get up slowly and use cane to avoid dizziness and falling. Past Medical History:  Diagnosis Date  . Arthritis   . BPH (benign prostatic hypertrophy)   . Diabetes mellitus    DX  5 YR AGO  . Glaucoma   . HLD (hyperlipidemia) 09/28/2017  . Kidney stones    Past Surgical History:  Procedure Laterality Date  . CYSTOSCOPY WITH RETROGRADE PYELOGRAM, URETEROSCOPY AND STENT PLACEMENT Right 05/20/2016   Procedure: CYSTOSCOPY, URETEROSCOPY, TRANSURETHRAL INCISION OF URETHROCELE, BASKET STONE REMOVAL;  Surgeon: Irine Seal, MD;  Location: [facility];  Service: Urology;  Laterality: Right;  . left total knee replacement    . TOTAL KNEE ARTHROPLASTY  04/04/2011   Procedure: TOTAL KNEE ARTHROPLASTY;  Surgeon: Rudean Haskell, MD;  Location: [facility];  Service: Orthopedics;  Laterality: Left;    reports that he quit smoking about 11 years ago. His smoking use included cigarettes. He has a 67.50 pack-year smoking history. His smokeless tobacco use includes chew. He reports that he does not drink alcohol or use drugs. family history includes Arthritis in  his mother; Asthma in his paternal grandfather; Diabetes in his sister. No Known Allergies Current Outpatient Medications on File Prior to Visit  Medication Sig Dispense Refill  . aspirin EC 81 MG tablet Take 81 mg by mouth at bedtime.    . brimonidine-timolol (COMBIGAN) 0.2-0.5 % ophthalmic solution Place 1 drop into both eyes every 12 (twelve) hours. 15 mL 1  . diclofenac sodium (VOLTAREN) 1 % GEL Apply 1 application topically 3 (three) times daily. Right knee and both shoulders. 3 Tube 4  . Fluocinolone Acetonide 0.01 % OIL Place 5 drops in ear(s) 2 (two) times daily. 20 mL 1  . glucose blood (IGLUCOSE TEST STRIPS) test strip Use as instructed 100 each 12  . HYDROcodone-acetaminophen (NORCO) 10-325 MG tablet TAKE 1 TABLET BY MOUTH EVERY 8 HOURS AS NEEDED 90 tablet 0  . Multiple Vitamin (MULTIVITAMIN WITH MINERALS) TABS tablet Take 1 tablet by mouth daily.    Marland Kitchen triamcinolone ointment (KENALOG) 0.5 % Apply 1 application topically 2 (two) times daily. 30 g 1   No current facility-administered medications on file prior to visit.    Review of Systems Constitutional: Negative for other unusual diaphoresis, sweats, appetite or weight changes HENT: Negative for other worsening hearing loss, ear pain, facial swelling, mouth sores or neck stiffness.   Eyes: Negative for other worsening pain, redness or other visual disturbance.  Respiratory: Negative for other stridor or swelling Cardiovascular: Negative for other palpitations or other chest pain  Gastrointestinal: Negative for worsening diarrhea or loose stools, blood in stool, distention or other  pain Genitourinary: Negative for hematuria, flank pain or other change in urine volume.  Musculoskeletal: Negative for myalgias or other joint swelling.  Skin: Negative for other color change, or other wound or worsening drainage.  Neurological: Negative for other syncope or numbness. Hematological: Negative for other adenopathy or  swelling Psychiatric/Behavioral: Negative for hallucinations, other worsening agitation, SI, self-injury, or new decreased concentration All other system neg per pt    Objective:   Physical Exam BP 120/76   Pulse 75   Temp 98 F (36.7 C) (Oral)   Ht 6' (1.829 m)   Wt 201 lb (91.2 kg)   SpO2 95%   BMI 27.26 kg/m  VS noted,  Constitutional: Pt is oriented to person, place, and time. Appears well-developed and well-nourished, in no significant distress and comfortable Head: Normocephalic and atraumatic  Eyes: Conjunctivae and EOM are normal. Pupils are equal, round, and reactive to light Right Ear: External ear normal without discharge Left Ear: External ear normal without discharge Nose: Nose without discharge or deformity Mouth/Throat: Oropharynx is without other ulcerations and moist  Neck: Normal range of motion. Neck supple. No JVD present. No tracheal deviation present or significant neck LA or mass Cardiovascular: Normal rate, regular rhythm, normal heart sounds and intact distal pulses.   Pulmonary/Chest: WOB normal and breath sounds without rales or wheezing  Abdominal: Soft. Bowel sounds are normal. NT. No HSM  Musculoskeletal: Normal range of motion. Exhibits no edema Lymphadenopathy: Has no other cervical adenopathy.  Neurological: Pt is alert and oriented to person, place, and time. Pt has normal reflexes. No cranial nerve deficit. Motor grossly intact, Gait intact Skin: Skin is warm and dry. No rash noted or new ulcerations Psychiatric:  Has normal mood and affect. Behavior is normal without agitation No other exam findings Lab Results  Component Value Date   WBC 8.3 03/31/2017   HGB 13.5 03/31/2017   HCT 41.5 03/31/2017   PLT 272.0 03/31/2017   GLUCOSE 180 (H) 03/31/2017   CHOL 139 03/31/2017   TRIG 224.0 (H) 03/31/2017   HDL 25.30 (L) 03/31/2017   LDLDIRECT 86.0 03/31/2017   ALT 25 03/31/2017   AST 21 03/31/2017   NA 140 03/31/2017   K 3.9 03/31/2017   CL  105 03/31/2017   CREATININE 0.80 03/31/2017   BUN 12 03/31/2017   CO2 25 03/31/2017   TSH 1.38 12/13/2016   PSA 0.59 12/13/2016   INR 1.06 03/22/2011   HGBA1C 8.6 (H) 03/31/2017   MICROALBUR 1.0 12/13/2016       Assessment & Plan:

## 2017-09-28 NOTE — Assessment & Plan Note (Signed)

## 2017-09-28 NOTE — Patient Instructions (Signed)

## 2017-09-28 NOTE — Patient Instructions (Addendum)
Continue doing brain stimulating activities (puzzles, reading, adult coloring books, staying active) to keep memory sharp.   Continue to eat heart healthy diet (full of fruits, vegetables, whole grains, lean protein, water--limit salt, fat, and sugar intake) and increase physical activity as tolerated.  David Kelly , Thank you for taking time to come for your Medicare Wellness Visit. I appreciate your ongoing commitment to your health goals. Please review the following plan we discussed and let me know if I can assist you in the future.   These are the goals we discussed: Goals    . Patient Stated     Stay as healthy and as independent as possible. Continue to exercise, eat healthy, and enjoy life and church.       This is a list of the screening recommended for you and due dates:  Health Maintenance  Topic Date Due  . Hemoglobin A1C  09/28/2017  . Flu Shot  11/15/2017*  . Tetanus Vaccine  09/29/2018*  . Pneumonia vaccines (1 of 2 - PCV13) 09/29/2018*  . Complete foot exam   12/13/2017  . Urine Protein Check  12/13/2017  . Eye exam for diabetics  08/09/2018  . Colon Cancer Screening  01/28/2019  .  Hepatitis C: One time screening is recommended by Center for Disease Control  (CDC) for  adults born from [birth year] through 1965.   Completed  *Topic was postponed. The date shown is not the original due date.   [facility] would like to hear from you! Contact us if we can help you or your loved one, or if you would like to volunteer. For immediate assistance, phone the [facility].  Call us:  [place]  [phone]  The [facility] is staffed by compassionate and knowledgeable specialists who can not only provide information on resources available through a myriad of government and civic programs, but can also refer specific needs directly to volunteers across the state.  Call Center assistance is available:  Monday through Friday  9:00 a.m. - 5:00 p.m.  You can also  reach [place] by mail:  [facility]  [address] [place]  Email [place]:  Use the contact form to message [place] and/or subscribe to our newsletter.  Please read our privacy policy. Referrals are accepted by phone only. Please phone [phone] to speak with a Call Center Specialist.     Carbohydrate Counting for Diabetes Mellitus, Adult Carbohydrate counting is a method for keeping track of how many carbohydrates you eat. Eating carbohydrates naturally increases the amount of sugar (glucose) in the blood. Counting how many carbohydrates you eat helps keep your blood glucose within normal limits, which helps you manage your diabetes (diabetes mellitus). It is important to know how many carbohydrates you can safely have in each meal. This is different for every person. A diet and nutrition specialist (registered dietitian) can help you make a meal plan and calculate how many carbohydrates you should have at each meal and snack. Carbohydrates are found in the following foods:  Grains, such as breads and cereals.  Dried beans and soy products.  Starchy vegetables, such as potatoes, peas, and corn.  Fruit and fruit juices.  Milk and yogurt.  Sweets and snack foods, such as cake, cookies, candy, chips, and soft drinks.  How do I count carbohydrates? There are two ways to count carbohydrates in food. You can use either of the methods or a combination of both. Reading "Nutrition Facts" on packaged  food The "Nutrition Facts" list is included on the labels of almost all packaged foods and beverages in the [place] It includes:  The serving size.  Information about nutrients in each serving, including the grams (g) of carbohydrate per serving.  To use the "Nutrition Facts":  Decide how many servings you will have.  Multiply the number of servings by the number of carbohydrates per serving.  The resulting number is the total amount of carbohydrates that you will be  having.  Learning standard serving sizes of other foods When you eat foods containing carbohydrates that are not packaged or do not include "Nutrition Facts" on the label, you need to measure the servings in order to count the amount of carbohydrates:  Measure the foods that you will eat with a food scale or measuring cup, if needed.  Decide how many standard-size servings you will eat.  Multiply the number of servings by 15. Most carbohydrate-rich foods have about 15 g of carbohydrates per serving. ? For example, if you eat 8 oz (170 g) of strawberries, you will have eaten 2 servings and 30 g of carbohydrates (2 servings x 15 g = 30 g).  For foods that have more than one food mixed, such as soups and casseroles, you must count the carbohydrates in each food that is included.  The following list contains standard serving sizes of common carbohydrate-rich foods. Each of these servings has about 15 g of carbohydrates:   hamburger bun or  English muffin.   oz (15 mL) syrup.   oz (14 g) jelly.  1 slice of bread.  1 six-inch tortilla.  3 oz (85 g) cooked rice or pasta.  4 oz (113 g) cooked dried beans.  4 oz (113 g) starchy vegetable, such as peas, corn, or potatoes.  4 oz (113 g) hot cereal.  4 oz (113 g) mashed potatoes or  of a large baked potato.  4 oz (113 g) canned or frozen fruit.  4 oz (120 mL) fruit juice.  4-6 crackers.  6 chicken nuggets.  6 oz (170 g) unsweetened dry cereal.  6 oz (170 g) plain fat-free yogurt or yogurt sweetened with artificial sweeteners.  8 oz (240 mL) milk.  8 oz (170 g) fresh fruit or one small piece of fruit.  24 oz (680 g) popped popcorn.  Example of carbohydrate counting Sample meal  3 oz (85 g) chicken breast.  6 oz (170 g) brown rice.  4 oz (113 g) corn.  8 oz (240 mL) milk.  8 oz (170 g) strawberries with sugar-free whipped topping. Carbohydrate calculation 1. Identify the foods that contain  carbohydrates: ? Rice. ? Corn. ? Milk. ? Strawberries. 2. Calculate how many servings you have of each food: ? 2 servings rice. ? 1 serving corn. ? 1 serving milk. ? 1 serving strawberries. 3. Multiply each number of servings by 15 g: ? 2 servings rice x 15 g = 30 g. ? 1 serving corn x 15 g = 15 g. ? 1 serving milk x 15 g = 15 g. ? 1 serving strawberries x 15 g = 15 g. 4. Add together all of the amounts to find the total grams of carbohydrates eaten: ? 30 g + 15 g + 15 g + 15 g = 75 g of carbohydrates total. This information is not intended to replace advice given to you by your health care provider. Make sure you discuss any questions you have with your health care provider. Document Released:  01/31/2005 Document Revised: 08/21/2015 Document Reviewed: 07/15/2015 Elsevier Interactive Patient Education  2018 [employer].  Diabetes Mellitus and Nutrition When you have diabetes (diabetes mellitus), it is very important to have healthy eating habits because your blood sugar (glucose) levels are greatly affected by what you eat and drink. Eating healthy foods in the appropriate amounts, at about the same times every day, can help you:  Control your blood glucose.  Lower your risk of heart disease.  Improve your blood pressure.  Reach or maintain a healthy weight.  Every person with diabetes is different, and each person has different needs for a meal plan. Your health care provider may recommend that you work with a diet and nutrition specialist (dietitian) to make a meal plan that is best for you. Your meal plan may vary depending on factors such as:  The calories you need.  The medicines you take.  Your weight.  Your blood glucose, blood pressure, and cholesterol levels.  Your activity level.  Other health conditions you have, such as heart or kidney disease.  How do carbohydrates affect me? Carbohydrates affect your blood glucose level more than any other type of food.  Eating carbohydrates naturally increases the amount of glucose in your blood. Carbohydrate counting is a method for keeping track of how many carbohydrates you eat. Counting carbohydrates is important to keep your blood glucose at a healthy level, especially if you use insulin or take certain oral diabetes medicines. It is important to know how many carbohydrates you can safely have in each meal. This is different for every person. Your dietitian can help you calculate how many carbohydrates you should have at each meal and for snack. Foods that contain carbohydrates include:  Bread, cereal, rice, pasta, and crackers.  Potatoes and corn.  Peas, beans, and lentils.  Milk and yogurt.  Fruit and juice.  Desserts, such as cakes, cookies, ice cream, and candy.  How does alcohol affect me? Alcohol can cause a sudden decrease in blood glucose (hypoglycemia), especially if you use insulin or take certain oral diabetes medicines. Hypoglycemia can be a life-threatening condition. Symptoms of hypoglycemia (sleepiness, dizziness, and confusion) are similar to symptoms of having too much alcohol. If your health care provider says that alcohol is safe for you, follow these guidelines:  Limit alcohol intake to no more than 1 drink per day for nonpregnant women and 2 drinks per day for men. One drink equals 12 oz of beer, 5 oz of wine, or 1 oz of hard liquor.  Do not drink on an empty stomach.  Keep yourself hydrated with water, diet soda, or unsweetened iced tea.  Keep in mind that regular soda, juice, and other mixers may contain a lot of sugar and must be counted as carbohydrates.  What are tips for following this plan? Reading food labels  Start by checking the serving size on the label. The amount of calories, carbohydrates, fats, and other nutrients listed on the label are based on one serving of the food. Many foods contain more than one serving per package.  Check the total grams (g) of  carbohydrates in one serving. You can calculate the number of servings of carbohydrates in one serving by dividing the total carbohydrates by 15. For example, if a food has 30 g of total carbohydrates, it would be equal to 2 servings of carbohydrates.  Check the number of grams (g) of saturated and trans fats in one serving. Choose foods that have low or no amount  of these fats.  Check the number of milligrams (mg) of sodium in one serving. Most people should limit total sodium intake to less than 2,300 mg per day.  Always check the nutrition information of foods labeled as "low-fat" or "nonfat". These foods may be higher in added sugar or refined carbohydrates and should be avoided.  Talk to your dietitian to identify your daily goals for nutrients listed on the label. Shopping  Avoid buying canned, premade, or processed foods. These foods tend to be high in fat, sodium, and added sugar.  Shop around the outside edge of the grocery store. This includes fresh fruits and vegetables, bulk grains, fresh meats, and fresh dairy. Cooking  Use low-heat cooking methods, such as baking, instead of high-heat cooking methods like deep frying.  Cook using healthy oils, such as olive, canola, or sunflower oil.  Avoid cooking with butter, cream, or high-fat meats. Meal planning  Eat meals and snacks regularly, preferably at the same times every day. Avoid going long periods of time without eating.  Eat foods high in fiber, such as fresh fruits, vegetables, beans, and whole grains. Talk to your dietitian about how many servings of carbohydrates you can eat at each meal.  Eat 4-6 ounces of lean protein each day, such as lean meat, chicken, fish, eggs, or tofu. 1 ounce is equal to 1 ounce of meat, chicken, or fish, 1 egg, or 1/4 cup of tofu.  Eat some foods each day that contain healthy fats, such as avocado, nuts, seeds, and fish. Lifestyle   Check your blood glucose regularly.  Exercise at least  30 minutes 5 or more days each week, or as told by your health care provider.  Take medicines as told by your health care provider.  Do not use any products that contain nicotine or tobacco, such as cigarettes and e-cigarettes. If you need help quitting, ask your health care provider.  Work with a Social worker or diabetes educator to identify strategies to manage stress and any emotional and social challenges. What are some questions to ask my health care provider?  Do I need to meet with a diabetes educator?  Do I need to meet with a dietitian?  What number can I call if I have questions?  When are the best times to check my blood glucose? Where to find more information:  American Diabetes Association: diabetes.org/food-and-fitness/food  Academy of Nutrition and Dietetics: PokerClues.dk  [employer] of Diabetes and Digestive and Kidney Diseases (NIH): ContactWire.be Summary  A healthy meal plan will help you control your blood glucose and maintain a healthy lifestyle.  Working with a diet and nutrition specialist (dietitian) can help you make a meal plan that is best for you.  Keep in mind that carbohydrates and alcohol have immediate effects on your blood glucose levels. It is important to count carbohydrates and to use alcohol carefully. This information is not intended to replace advice given to you by your health care provider. Make sure you discuss any questions you have with your health care provider. Document Released: 10/28/2004 Document Revised: 03/07/2016 Document Reviewed: 03/07/2016 Elsevier Interactive Patient Education  [employer].

## 2017-09-29 ENCOUNTER — Encounter: Payer: Self-pay | Admitting: Internal Medicine

## 2017-09-29 ENCOUNTER — Other Ambulatory Visit: Payer: Self-pay | Admitting: Internal Medicine

## 2017-09-29 MED ORDER — ATORVASTATIN CALCIUM 40 MG PO TABS: 40.0000 mg | ORAL_TABLET | Freq: Every day | ORAL | 3 refills | Status: DC

## 2017-09-29 MED ORDER — GLIPIZIDE ER 5 MG PO TB24: 5.0000 mg | ORAL_TABLET | Freq: Every day | ORAL | 3 refills | Status: DC

## 2017-10-02 ENCOUNTER — Telehealth: Payer: Self-pay | Admitting: Internal Medicine

## 2017-10-02 ENCOUNTER — Telehealth: Payer: Self-pay

## 2017-10-02 NOTE — Telephone Encounter (Signed)
Called pt, LVM.   CRM created.  

## 2017-10-02 NOTE — Telephone Encounter (Signed)
Patient called about his lab results- patient notified of results- reviewed medication and instruction. Patient will expect letter in mail.

## 2017-10-02 NOTE — Telephone Encounter (Signed)
-----   Message from Biagio Borg, MD sent at 09/29/2017  8:11 AM EDT ----- Left message on MyChart, pt to cont same tx except  The test results show that your current treatment is OK, except the A1c is mildly high, and the LDL cholesterol is severely high.  We should increase the glipizide ER to 5 mg per day, and please take the lipitor 40 mg as prescribed.  I will send the prescriptions, and you should hear from the office as well.Redmond Baseman to please inform pt, I will do rx x 2

## 2017-10-05 ENCOUNTER — Other Ambulatory Visit: Payer: Self-pay | Admitting: Internal Medicine

## 2017-10-05 DIAGNOSIS — M159 Polyosteoarthritis, unspecified: Secondary | ICD-10-CM

## 2017-10-05 DIAGNOSIS — M150 Primary generalized (osteo)arthritis: Principal | ICD-10-CM

## 2017-10-05 NOTE — Telephone Encounter (Signed)
Last OV 09/28/17  [place] Controlled Substance Database checked. Last filled on 09/06/17

## 2017-10-05 NOTE — Telephone Encounter (Signed)
Done erx  Ok to let pt know, that if this medication cont's to be required, I will need to refer to Pain Management at some point, as I do not practice chronic pain medicine

## 2017-10-06 NOTE — Telephone Encounter (Signed)
Spoke with pt to inform.  

## 2017-10-13 ENCOUNTER — Ambulatory Visit (INDEPENDENT_AMBULATORY_CARE_PROVIDER_SITE_OTHER): Payer: Medicare Other | Admitting: Sports Medicine

## 2017-10-13 ENCOUNTER — Encounter: Payer: Self-pay | Admitting: Sports Medicine

## 2017-10-13 VITALS — BP 122/81 | HR 92 | Resp 15

## 2017-10-13 DIAGNOSIS — E119 Type 2 diabetes mellitus without complications: Secondary | ICD-10-CM

## 2017-10-13 DIAGNOSIS — M79676 Pain in unspecified toe(s): Secondary | ICD-10-CM | POA: Diagnosis not present

## 2017-10-13 DIAGNOSIS — B351 Tinea unguium: Secondary | ICD-10-CM

## 2017-10-13 NOTE — Progress Notes (Signed)
Subjective: David Kelly is a 72 y.o. male patient with history of diabetes who returns to office today complaining of long, painful nails  while ambulating in shoes; unable to trim. Patient states that the glucose reading yesterday was 131mg /dl. Patient denies any new changes in medication or new problems. Patient denies any other pedal complaints at this time.  Saw primary care Dr. Cathlean Cower was earlier this month  Patient Active Problem List   Diagnosis Date Noted  . HLD (hyperlipidemia) 09/28/2017  . Cough 03/31/2017  . Primary osteoarthritis of right knee 03/15/2017  . Unspecified injury of lower back, sequela 03/15/2017  . Bilateral rotator cuff syndrome 03/15/2017  . Recurrent falls while walking 12/13/2016  . Preventative health care 12/13/2016  . Chronic pain 12/13/2016  . Kidney stones   . Glaucoma   . Gastrointestinal bleed 06/28/2016  . Dysuria 10/28/2015  . Itching of ear 08/13/2015  . Type 2 diabetes mellitus ([place]) 07/15/2015  . Osteoarthritis 07/15/2015   Current Outpatient Medications on File Prior to Visit  Medication Sig Dispense Refill  . aspirin EC 81 MG tablet Take 81 mg by mouth at bedtime.    Marland Kitchen atorvastatin (LIPITOR) 40 MG tablet Take 1 tablet (40 mg total) by mouth daily. 90 tablet 3  . brimonidine-timolol (COMBIGAN) 0.2-0.5 % ophthalmic solution Place 1 drop into both eyes every 12 (twelve) hours. 15 mL 1  . diclofenac sodium (VOLTAREN) 1 % GEL Apply 1 application topically 3 (three) times daily. Right knee and both shoulders. 3 Tube 4  . Fluocinolone Acetonide 0.01 % OIL Place 5 drops in ear(s) 2 (two) times daily. 20 mL 1  . glipiZIDE (GLUCOTROL XL) 5 MG 24 hr tablet Take 1 tablet (5 mg total) by mouth daily with breakfast. 90 tablet 3  . glucose blood (IGLUCOSE TEST STRIPS) test strip Use as instructed 100 each 12  . HYDROcodone-acetaminophen (NORCO) 10-325 MG tablet TAKE 1 TABLET BY MOUTH EVERY 8 HOURS AS NEEDED 90 tablet 0  . metFORMIN  (GLUCOPHAGE) 1000 MG tablet Take 1 tablet (1,000 mg total) by mouth 2 (two) times daily with a meal. Must keep appt w/new provider for future refills 180 tablet 3  . montelukast (SINGULAIR) 10 MG tablet Take 1 tablet (10 mg total) by mouth at bedtime. 90 tablet 3  . Multiple Vitamin (MULTIVITAMIN WITH MINERALS) TABS tablet Take 1 tablet by mouth daily.    . tamsulosin (FLOMAX) 0.4 MG CAPS capsule Take 1 capsule (0.4 mg total) by mouth at bedtime. 90 capsule 3  . triamcinolone ointment (KENALOG) 0.5 % Apply 1 application topically 2 (two) times daily. 30 g 1   No current facility-administered medications on file prior to visit.    No Known Allergies  Recent Results (from the past 2160 hour(s))  HM DIABETES EYE EXAM     Status: None   Collection Time: 08/08/17 12:00 AM  Result Value Ref Range   HM Diabetic Eye Exam No Retinopathy No Retinopathy  PSA     Status: None   Collection Time: 09/28/17  2:38 PM  Result Value Ref Range   PSA 0.75 0.10 - 4.00 ng/mL    Comment: Test performed using Access Hybritech PSA Assay, a parmagnetic partical, chemiluminecent immunoassay.  Urinalysis, Routine w reflex microscopic     Status: Abnormal   Collection Time: 09/28/17  2:38 PM  Result Value Ref Range   Color, Urine YELLOW Yellow;Lt. Yellow   APPearance CLEAR Clear   Specific Gravity, Urine >=1.030 (A) 1.000 -  1.030   pH 5.0 5.0 - 8.0   Total Protein, Urine NEGATIVE Negative   Urine Glucose NEGATIVE Negative   Ketones, ur NEGATIVE Negative   Bilirubin Urine NEGATIVE Negative   Hgb urine dipstick NEGATIVE Negative   Urobilinogen, UA 0.2 0.0 - 1.0   Leukocytes, UA NEGATIVE Negative   Nitrite NEGATIVE Negative   WBC, UA 0-2/hpf 0-2/hpf   RBC / HPF none seen 0-2/hpf   Mucus, UA Presence of (A) None  TSH     Status: None   Collection Time: 09/28/17  2:38 PM  Result Value Ref Range   TSH 0.77 0.35 - 4.50 uIU/mL  CBC with Differential/Platelet     Status: None   Collection Time: 09/28/17  2:38 PM   Result Value Ref Range   WBC 8.3 4.0 - 10.5 K/uL   RBC 4.95 4.22 - 5.81 Mil/uL   Hemoglobin 13.4 13.0 - 17.0 g/dL   HCT 41.5 39.0 - 52.0 %   MCV 83.9 78.0 - 100.0 fl   MCHC 32.3 30.0 - 36.0 g/dL   RDW 14.6 11.5 - 15.5 %   Platelets 265.0 150.0 - 400.0 K/uL   Neutrophils Relative % 74.7 43.0 - 77.0 %   Lymphocytes Relative 17.9 12.0 - 46.0 %   Monocytes Relative 5.0 3.0 - 12.0 %   Eosinophils Relative 2.1 0.0 - 5.0 %   Basophils Relative 0.3 0.0 - 3.0 %   Neutro Abs 6.2 1.4 - 7.7 K/uL   Lymphs Abs 1.5 0.7 - 4.0 K/uL   Monocytes Absolute 0.4 0.1 - 1.0 K/uL   Eosinophils Absolute 0.2 0.0 - 0.7 K/uL   Basophils Absolute 0.0 0.0 - 0.1 K/uL  Hepatic function panel     Status: None   Collection Time: 09/28/17  2:38 PM  Result Value Ref Range   Total Bilirubin 0.6 0.2 - 1.2 mg/dL   Bilirubin, Direct 0.1 0.0 - 0.3 mg/dL   Alkaline Phosphatase 57 39 - 117 U/L   AST 20 0 - 37 U/L   ALT 22 0 - 53 U/L   Total Protein 7.6 6.0 - 8.3 g/dL   Albumin 4.7 3.5 - 5.2 g/dL  Basic metabolic panel     Status: Abnormal   Collection Time: 09/28/17  2:38 PM  Result Value Ref Range   Sodium 139 135 - 145 mEq/L   Potassium 4.4 3.5 - 5.1 mEq/L   Chloride 104 96 - 112 mEq/L   CO2 28 19 - 32 mEq/L   Glucose, Bld 115 (H) 70 - 99 mg/dL   BUN 13 6 - 23 mg/dL   Creatinine, Ser 0.77 0.40 - 1.50 mg/dL   Calcium 9.8 8.4 - 10.5 mg/dL   GFR 127.53 >60.00 mL/min  Lipid panel     Status: Abnormal   Collection Time: 09/28/17  2:38 PM  Result Value Ref Range   Cholesterol 270 (H) 0 - 200 mg/dL    Comment: ATP III Classification       Desirable:  < 200 mg/dL               Borderline High:  200 - 239 mg/dL          High:  > = 240 mg/dL   Triglycerides 183.0 (H) 0.0 - 149.0 mg/dL    Comment: Normal:  <150 mg/dLBorderline High:  150 - 199 mg/dL   HDL 33.30 (L) >39.00 mg/dL   VLDL 36.6 0.0 - 40.0 mg/dL   LDL Cholesterol 200 (H) 0 - 99  mg/dL   Total CHOL/HDL Ratio 8     Comment:                Men           Women1/2 Average Risk     3.4          3.3Average Risk          5.0          4.42X Average Risk          9.6          7.13X Average Risk          15.0          11.0                       NonHDL 237.00     Comment: NOTE:  Non-HDL goal should be 30 mg/dL higher than patient's LDL goal (i.e. LDL goal of < 70 mg/dL, would have non-HDL goal of < 100 mg/dL)  Hemoglobin A1c     Status: Abnormal   Collection Time: 09/28/17  2:38 PM  Result Value Ref Range   Hgb A1c MFr Bld 7.4 (H) 4.6 - 6.5 %    Comment: Glycemic Control Guidelines for People with Diabetes:Non Diabetic:  <6%Goal of Therapy: <7%Additional Action Suggested:  >8%   Microalbumin / creatinine urine ratio     Status: None   Collection Time: 09/28/17  2:38 PM  Result Value Ref Range   Microalb, Ur 1.9 0.0 - 1.9 mg/dL   Creatinine,U 129.0 mg/dL   Microalb Creat Ratio 1.5 0.0 - 30.0 mg/g    Objective: General: Patient is awake, alert, and oriented x 3 and in no acute distress. No acute changes with physical exam  Integument: Skin is warm, dry and supple bilateral. Nails are tender, long, thickened and dystrophic with subungual debris, consistent with onychomycosis, 1-5 bilateral. No signs of infection. No open lesions or preulcerative lesions present bilateral.  Minimal reactive keratosis to ball on left.  Remaining integument unremarkable.  Vasculature:  Dorsalis Pedis pulse 2/4 bilateral. Posterior Tibial pulse 1 /4 bilateral. Capillary fill time <3 sec 1-5 bilateral. Positive hair growth to the level of the digits.Temperature gradient within normal limits. No varicosities present bilateral. No edema present bilateral.   Neurology: The patient has intact sensation measured with a 5.07/10g Semmes Weinstein Monofilament at all pedal sites bilateral . Vibratory sensation diminished bilateral with tuning fork. No Babinski sign present bilateral.   Musculoskeletal: Asymptomatic bunion and hammertoe pedal deformities noted bilateral. Muscular  strength 5/5 in all lower extremity muscular groups bilateral without pain on range of motion. No tenderness with calf compression bilateral.  Assessment and Plan: Problem List Items Addressed This Visit    None    Visit Diagnoses    Pain due to onychomycosis of toenail    -  Primary   Diabetes mellitus without complication ([place])         -Examined patient. -Discussed and educated patient on diabetic foot care, especially with  regards to the vascular, neurological and musculoskeletal systems.  -Mechanically debrided all nails 1-5 bilateral using sterile nail nipper and filed with dremel without incident and smooth reactive keratosis on left using rotary bur without incident -Patient to return  in 3 months for at risk foot care -Patient advised to call the office if any problems or questions arise in the meantime.  Landis Martins, DPM

## 2017-11-04 ENCOUNTER — Other Ambulatory Visit: Payer: Self-pay | Admitting: Internal Medicine

## 2017-11-04 DIAGNOSIS — M150 Primary generalized (osteo)arthritis: Principal | ICD-10-CM

## 2017-11-04 DIAGNOSIS — M159 Polyosteoarthritis, unspecified: Secondary | ICD-10-CM

## 2017-11-04 NOTE — Telephone Encounter (Signed)
Please let pt know, I refilled his rx for pain medication this last time, but I cannot fill further after this, as I do not practice chronic pain treatment  Pt has been referred to pain clinic twice last 2018, orthopedic June 2019 and recommended for sports medicine in this office at last visit  I believe I have acted in good faith to bridge him towards solutions for his pain, and I can no longer continue to tx with narcotic pain medication after this month

## 2017-11-04 NOTE — Telephone Encounter (Signed)
 Controlled Substance Database checked. Last filled on 10/05/17  Last OV 09/28/17

## 2017-11-06 NOTE — Telephone Encounter (Signed)
Pt informed of below.  

## 2017-12-06 ENCOUNTER — Telehealth: Payer: Self-pay | Admitting: Internal Medicine

## 2017-12-06 DIAGNOSIS — M159 Polyosteoarthritis, unspecified: Secondary | ICD-10-CM

## 2017-12-06 DIAGNOSIS — M150 Primary generalized (osteo)arthritis: Principal | ICD-10-CM

## 2017-12-06 NOTE — Telephone Encounter (Signed)
Called Dr. Plummer's office and left a msg with the his asst to see if they can get him back on the schedule. I called pt and he is aware.

## 2017-12-06 NOTE — Telephone Encounter (Signed)
Copied from [place] [id number]. Topic: General - Other >> Dec 06, 2017 10:08 AM Lennox Solders wrote: Reason for CRM: pt is calling and needs a refill on hydrocodone. [facility] in [place]

## 2017-12-06 NOTE — Telephone Encounter (Signed)
Noted  

## 2017-12-06 NOTE — Telephone Encounter (Signed)
Spoke with pt, reviewed the 9/21 telephone note with him again. He stated that he had an appt with pain management but he was sick that day and could not go. He did not think to reschedule.   Cecille Rubin- can you follow up on this and inform the patient of what you can find out? Thank you!

## 2017-12-06 NOTE — Telephone Encounter (Signed)
I am not able to fill.  Please review Dr Gwynn Burly note from 9/21 - that was the last refill and he advised he will not be filling this prescription any more.

## 2017-12-06 NOTE — Telephone Encounter (Signed)
I have reviewed the 9/21 note and called the patient. I received the VM so I left a msg for patient to call back and discuss.

## 2017-12-06 NOTE — Telephone Encounter (Signed)
Pt is returning shirron call

## 2017-12-06 NOTE — Telephone Encounter (Signed)
   LOV:09/28/17 NextOV:not scheduled Last Filled/Quantity:11/04/17 90#

## 2017-12-07 DIAGNOSIS — R0601 Orthopnea: Secondary | ICD-10-CM | POA: Diagnosis not present

## 2017-12-07 DIAGNOSIS — R0602 Shortness of breath: Secondary | ICD-10-CM | POA: Diagnosis not present

## 2017-12-07 DIAGNOSIS — I2119 ST elevation (STEMI) myocardial infarction involving other coronary artery of inferior wall: Secondary | ICD-10-CM | POA: Diagnosis not present

## 2017-12-07 DIAGNOSIS — M25511 Pain in right shoulder: Secondary | ICD-10-CM | POA: Diagnosis not present

## 2017-12-07 DIAGNOSIS — M75101 Unspecified rotator cuff tear or rupture of right shoulder, not specified as traumatic: Secondary | ICD-10-CM | POA: Diagnosis not present

## 2017-12-07 DIAGNOSIS — R918 Other nonspecific abnormal finding of lung field: Secondary | ICD-10-CM | POA: Diagnosis not present

## 2017-12-07 DIAGNOSIS — M25561 Pain in right knee: Secondary | ICD-10-CM | POA: Diagnosis not present

## 2017-12-08 DIAGNOSIS — I2119 ST elevation (STEMI) myocardial infarction involving other coronary artery of inferior wall: Secondary | ICD-10-CM | POA: Diagnosis not present

## 2017-12-11 DIAGNOSIS — Z7689 Persons encountering health services in other specified circumstances: Secondary | ICD-10-CM | POA: Diagnosis not present

## 2017-12-11 DIAGNOSIS — E119 Type 2 diabetes mellitus without complications: Secondary | ICD-10-CM | POA: Diagnosis not present

## 2017-12-11 DIAGNOSIS — M75101 Unspecified rotator cuff tear or rupture of right shoulder, not specified as traumatic: Secondary | ICD-10-CM | POA: Diagnosis not present

## 2017-12-11 DIAGNOSIS — I219 Acute myocardial infarction, unspecified: Secondary | ICD-10-CM | POA: Insufficient documentation

## 2017-12-11 DIAGNOSIS — K922 Gastrointestinal hemorrhage, unspecified: Secondary | ICD-10-CM | POA: Diagnosis not present

## 2017-12-11 DIAGNOSIS — M1711 Unilateral primary osteoarthritis, right knee: Secondary | ICD-10-CM | POA: Diagnosis not present

## 2017-12-11 DIAGNOSIS — Z96652 Presence of left artificial knee joint: Secondary | ICD-10-CM | POA: Insufficient documentation

## 2017-12-20 ENCOUNTER — Ambulatory Visit (INDEPENDENT_AMBULATORY_CARE_PROVIDER_SITE_OTHER): Payer: Medicare Other | Admitting: Sports Medicine

## 2017-12-20 ENCOUNTER — Encounter: Payer: Self-pay | Admitting: Sports Medicine

## 2017-12-20 VITALS — BP 126/83 | HR 80 | Resp 16

## 2017-12-20 DIAGNOSIS — E119 Type 2 diabetes mellitus without complications: Secondary | ICD-10-CM

## 2017-12-20 DIAGNOSIS — B351 Tinea unguium: Secondary | ICD-10-CM | POA: Diagnosis not present

## 2017-12-20 DIAGNOSIS — M79676 Pain in unspecified toe(s): Secondary | ICD-10-CM | POA: Diagnosis not present

## 2017-12-20 NOTE — Progress Notes (Signed)
Subjective: David Kelly is a 72 y.o. male patient with history of diabetes who returns to office today complaining of long, painful nails  while ambulating in shoes; unable to trim. Patient states that the glucose reading yesterday was 134mg /dl. Patient denies any new changes in medication or new problems however patient reports that he is tired today. Patient denies any other pedal complaints at this time.  Saw primary care Dr. Cathlean Kelly in September.  Patient Active Problem List   Diagnosis Date Noted  . HLD (hyperlipidemia) 09/28/2017  . Cough 03/31/2017  . Primary osteoarthritis of right knee 03/15/2017  . Unspecified injury of lower back, sequela 03/15/2017  . Bilateral rotator cuff syndrome 03/15/2017  . Recurrent falls while walking 12/13/2016  . Preventative health care 12/13/2016  . Chronic pain 12/13/2016  . Kidney stones   . Glaucoma   . Gastrointestinal bleed 06/28/2016  . Dysuria 10/28/2015  . Itching of ear 08/13/2015  . Type 2 diabetes mellitus ([place]) 07/15/2015  . Osteoarthritis 07/15/2015   Current Outpatient Medications on File Prior to Visit  Medication Sig Dispense Refill  . aspirin EC 81 MG tablet Take 81 mg by mouth at bedtime.    Marland Kitchen atorvastatin (LIPITOR) 40 MG tablet Take 1 tablet (40 mg total) by mouth daily. 90 tablet 3  . brimonidine-timolol (COMBIGAN) 0.2-0.5 % ophthalmic solution Place 1 drop into both eyes every 12 (twelve) hours. 15 mL 1  . diclofenac sodium (VOLTAREN) 1 % GEL Apply 1 application topically 3 (three) times daily. Right knee and both shoulders. 3 Tube 4  . Fluocinolone Acetonide 0.01 % OIL Place 5 drops in ear(s) 2 (two) times daily. 20 mL 1  . glipiZIDE (GLUCOTROL XL) 5 MG 24 hr tablet Take 1 tablet (5 mg total) by mouth daily with breakfast. 90 tablet 3  . glucose blood (IGLUCOSE TEST STRIPS) test strip Use as instructed 100 each 12  . HYDROcodone-acetaminophen (NORCO) 10-325 MG tablet TAKE 1 TABLET BY MOUTH EVERY 8 HOURS AS  NEEDED 90 tablet 0  . metFORMIN (GLUCOPHAGE) 1000 MG tablet Take 1 tablet (1,000 mg total) by mouth 2 (two) times daily with a meal. Must keep appt w/new provider for future refills 180 tablet 3  . montelukast (SINGULAIR) 10 MG tablet Take 1 tablet (10 mg total) by mouth at bedtime. 90 tablet 3  . Multiple Vitamin (MULTIVITAMIN WITH MINERALS) TABS tablet Take 1 tablet by mouth daily.    . naproxen (NAPROSYN) 500 MG tablet Take by mouth.    . tamsulosin (FLOMAX) 0.4 MG CAPS capsule Take 1 capsule (0.4 mg total) by mouth at bedtime. 90 capsule 3  . triamcinolone ointment (KENALOG) 0.5 % Apply 1 application topically 2 (two) times daily. 30 g 1   No current facility-administered medications on file prior to visit.    No Known Allergies  Recent Results (from the past 2160 hour(s))  PSA     Status: None   Collection Time: 09/28/17  2:38 PM  Result Value Ref Range   PSA 0.75 0.10 - 4.00 ng/mL    Comment: Test performed using Access Hybritech PSA Assay, a parmagnetic partical, chemiluminecent immunoassay.  Urinalysis, Routine w reflex microscopic     Status: Abnormal   Collection Time: 09/28/17  2:38 PM  Result Value Ref Range   Color, Urine YELLOW Yellow;Lt. Yellow   APPearance CLEAR Clear   Specific Gravity, Urine >=1.030 (A) 1.000 - 1.030   pH 5.0 5.0 - 8.0   Total Protein, Urine NEGATIVE Negative  Urine Glucose NEGATIVE Negative   Ketones, ur NEGATIVE Negative   Bilirubin Urine NEGATIVE Negative   Hgb urine dipstick NEGATIVE Negative   Urobilinogen, UA 0.2 0.0 - 1.0   Leukocytes, UA NEGATIVE Negative   Nitrite NEGATIVE Negative   WBC, UA 0-2/hpf 0-2/hpf   RBC / HPF none seen 0-2/hpf   Mucus, UA Presence of (A) None  TSH     Status: None   Collection Time: 09/28/17  2:38 PM  Result Value Ref Range   TSH 0.77 0.35 - 4.50 uIU/mL  CBC with Differential/Platelet     Status: None   Collection Time: 09/28/17  2:38 PM  Result Value Ref Range   WBC 8.3 4.0 - 10.5 K/uL   RBC 4.95 4.22  - 5.81 Mil/uL   Hemoglobin 13.4 13.0 - 17.0 g/dL   HCT 41.5 39.0 - 52.0 %   MCV 83.9 78.0 - 100.0 fl   MCHC 32.3 30.0 - 36.0 g/dL   RDW 14.6 11.5 - 15.5 %   Platelets 265.0 150.0 - 400.0 K/uL   Neutrophils Relative % 74.7 43.0 - 77.0 %   Lymphocytes Relative 17.9 12.0 - 46.0 %   Monocytes Relative 5.0 3.0 - 12.0 %   Eosinophils Relative 2.1 0.0 - 5.0 %   Basophils Relative 0.3 0.0 - 3.0 %   Neutro Abs 6.2 1.4 - 7.7 K/uL   Lymphs Abs 1.5 0.7 - 4.0 K/uL   Monocytes Absolute 0.4 0.1 - 1.0 K/uL   Eosinophils Absolute 0.2 0.0 - 0.7 K/uL   Basophils Absolute 0.0 0.0 - 0.1 K/uL  Hepatic function panel     Status: None   Collection Time: 09/28/17  2:38 PM  Result Value Ref Range   Total Bilirubin 0.6 0.2 - 1.2 mg/dL   Bilirubin, Direct 0.1 0.0 - 0.3 mg/dL   Alkaline Phosphatase 57 39 - 117 U/L   AST 20 0 - 37 U/L   ALT 22 0 - 53 U/L   Total Protein 7.6 6.0 - 8.3 g/dL   Albumin 4.7 3.5 - 5.2 g/dL  Basic metabolic panel     Status: Abnormal   Collection Time: 09/28/17  2:38 PM  Result Value Ref Range   Sodium 139 135 - 145 mEq/L   Potassium 4.4 3.5 - 5.1 mEq/L   Chloride 104 96 - 112 mEq/L   CO2 28 19 - 32 mEq/L   Glucose, Bld 115 (H) 70 - 99 mg/dL   BUN 13 6 - 23 mg/dL   Creatinine, Ser 0.77 0.40 - 1.50 mg/dL   Calcium 9.8 8.4 - 10.5 mg/dL   GFR 127.53 >60.00 mL/min  Lipid panel     Status: Abnormal   Collection Time: 09/28/17  2:38 PM  Result Value Ref Range   Cholesterol 270 (H) 0 - 200 mg/dL    Comment: ATP III Classification       Desirable:  < 200 mg/dL               Borderline High:  200 - 239 mg/dL          High:  > = 240 mg/dL   Triglycerides 183.0 (H) 0.0 - 149.0 mg/dL    Comment: Normal:  <150 mg/dLBorderline High:  150 - 199 mg/dL   HDL 33.30 (L) >39.00 mg/dL   VLDL 36.6 0.0 - 40.0 mg/dL   LDL Cholesterol 200 (H) 0 - 99 mg/dL   Total CHOL/HDL Ratio 8     Comment:  Men          Women1/2 Average Risk     3.4          3.3Average Risk          5.0           4.42X Average Risk          9.6          7.13X Average Risk          15.0          11.0                       NonHDL 237.00     Comment: NOTE:  Non-HDL goal should be 30 mg/dL higher than patient's LDL goal (i.e. LDL goal of < 70 mg/dL, would have non-HDL goal of < 100 mg/dL)  Hemoglobin A1c     Status: Abnormal   Collection Time: 09/28/17  2:38 PM  Result Value Ref Range   Hgb A1c MFr Bld 7.4 (H) 4.6 - 6.5 %    Comment: Glycemic Control Guidelines for People with Diabetes:Non Diabetic:  <6%Goal of Therapy: <7%Additional Action Suggested:  >8%   Microalbumin / creatinine urine ratio     Status: None   Collection Time: 09/28/17  2:38 PM  Result Value Ref Range   Microalb, Ur 1.9 0.0 - 1.9 mg/dL   Creatinine,U 129.0 mg/dL   Microalb Creat Ratio 1.5 0.0 - 30.0 mg/g    Objective: General: Patient is awake, alert, and oriented x 3 and in no acute distress. No acute changes with physical exam  Integument: Skin is warm, dry and supple bilateral. Nails are tender, mildly elongated, thickened and dystrophic with subungual debris, consistent with onychomycosis, 1-5 bilateral. No signs of infection. No open lesions or preulcerative lesions present bilateral.  Minimal reactive keratosis to ball on left.  Remaining integument unremarkable.  Vasculature:  Dorsalis Pedis pulse 2/4 bilateral. Posterior Tibial pulse 1 /4 bilateral. Capillary fill time <3 sec 1-5 bilateral. Positive hair growth to the level of the digits.Temperature gradient within normal limits. No varicosities present bilateral. No edema present bilateral.   Neurology: The patient has intact sensation measured with a 5.07/10g Semmes Weinstein Monofilament at all pedal sites bilateral . Vibratory sensation diminished bilateral with tuning fork. No Babinski sign present bilateral.   Musculoskeletal: Asymptomatic bunion and hammertoe pedal deformities noted bilateral. Muscular strength 5/5 in all lower extremity muscular groups bilateral  without pain on range of motion. No tenderness with calf compression bilateral.  Assessment and Plan: Problem List Items Addressed This Visit    None    Visit Diagnoses    Pain due to onychomycosis of toenail    -  Primary   Diabetes mellitus without complication ([place])         -Examined patient. -Discussed and educated patient on diabetic foot care -Mechanically debrided all nails 1-5 bilateral using sterile nail nipper and filed with dremel without incident and smooth reactive keratosis on left using rotary bur without incident -Patient to return  in 3 months for at risk foot care -Patient advised to call the office if any problems or questions arise in the meantime.  Landis Martins, DPM

## 2017-12-21 DIAGNOSIS — G8929 Other chronic pain: Secondary | ICD-10-CM | POA: Diagnosis not present

## 2017-12-21 DIAGNOSIS — Z96652 Presence of left artificial knee joint: Secondary | ICD-10-CM | POA: Diagnosis not present

## 2017-12-21 DIAGNOSIS — M25561 Pain in right knee: Secondary | ICD-10-CM | POA: Diagnosis not present

## 2017-12-21 DIAGNOSIS — M25511 Pain in right shoulder: Secondary | ICD-10-CM | POA: Diagnosis not present

## 2017-12-27 ENCOUNTER — Other Ambulatory Visit: Payer: Self-pay | Admitting: Internal Medicine

## 2017-12-27 DIAGNOSIS — M159 Polyosteoarthritis, unspecified: Secondary | ICD-10-CM

## 2017-12-27 DIAGNOSIS — M150 Primary generalized (osteo)arthritis: Principal | ICD-10-CM

## 2017-12-27 MED ORDER — HYDROCODONE-ACETAMINOPHEN 10-325 MG PO TABS: 1.0000 | ORAL_TABLET | Freq: Three times a day (TID) | ORAL | 0 refills | Status: DC | PRN

## 2017-12-27 MED ORDER — GLUCOSE BLOOD VI STRP: ORAL_STRIP | 12 refills | Status: DC

## 2017-12-27 NOTE — Telephone Encounter (Signed)
Copied from [place] [id number]. Topic: Quick Communication - Rx Refill/Question >> Dec 27, 2017  9:51 AM Leward Quan A wrote: Medication: HYDROcodone-acetaminophen (NORCO) 10-325 MG tablet, glucose blood (IGLUCOSE TEST STRIPS) test strip, Blood Glucose Lancets  Has the patient contacted their pharmacy? Yes.     Preferred Pharmacy (with phone number or street name): [facility], [place] [phone] (Phone) [phone] (Fax)    Agent: Please be advised that RX refills may take up to 3 business days. We ask that you follow-up with your pharmacy.

## 2017-12-27 NOTE — Telephone Encounter (Signed)
Requested medication (s) are due for refill today: Yes  Requested medication (s) are on the active medication list: Yes  Last refill:  11/04/17  Future visit scheduled: No  Notes to clinic:  Also requesting lancets.    Requested Prescriptions  Pending Prescriptions Disp Refills   HYDROcodone-acetaminophen (NORCO) 10-325 MG tablet 90 tablet 0    Sig: Take 1 tablet by mouth every 8 (eight) hours as needed.     Not Delegated - Analgesics:  Opioid Agonist Combinations Failed - 12/27/2017  9:58 AM      Failed - This refill cannot be delegated      Failed - Urine Drug Screen completed in last 360 days.      Passed - Valid encounter within last 6 months    Recent Outpatient Visits          3 months ago Type 2 diabetes mellitus without complication, without long-term current use of insulin ([place])   [place] John, James W, MD   9 months ago Cough   [facility] -Georges Mouse, MD   1 year ago Preventative health care   [facility] -Georges Mouse, MD   1 year ago Gastrointestinal hemorrhage, unspecified gastrointestinal hemorrhage type   [place], FNP   1 year ago Primary osteoarthritis involving multiple joints   [place], Ples Specter, FNP            glucose blood (IGLUCOSE TEST STRIPS) test strip 100 each 12    Sig: Use as instructed     Endocrinology: Diabetes - Testing Supplies Passed - 12/27/2017  9:58 AM      Passed - Valid encounter within last 12 months    Recent Outpatient Visits          3 months ago Type 2 diabetes mellitus without complication, without long-term current use of insulin ([place])   [place] [facility] -Georges Mouse, MD   9 months ago Cough   [place], James W, MD   1 year ago Preventative health care   [facility] -Georges Mouse, MD   1 year ago Gastrointestinal hemorrhage, unspecified gastrointestinal hemorrhage type   [place], [place]   1 year ago Primary osteoarthritis involving multiple joints   [place], Gregory D, FNP

## 2017-12-27 NOTE — Telephone Encounter (Signed)
Pt also requesting lancets

## 2017-12-27 NOTE — Telephone Encounter (Signed)
Done erx 

## 2017-12-28 NOTE — Telephone Encounter (Signed)
MD approved and sent electronically to pof../lmb  

## 2018-01-24 ENCOUNTER — Other Ambulatory Visit: Payer: Self-pay | Admitting: Family

## 2018-01-24 ENCOUNTER — Other Ambulatory Visit: Payer: Self-pay | Admitting: Internal Medicine

## 2018-01-24 NOTE — Telephone Encounter (Signed)
Pt would need OV regarding cough med with codeine to determine if appropriate

## 2018-01-24 NOTE — Telephone Encounter (Signed)
Please schedule an OV for pt since med is not working for his symptoms.

## 2018-01-26 ENCOUNTER — Other Ambulatory Visit: Payer: Self-pay | Admitting: Internal Medicine

## 2018-01-26 DIAGNOSIS — M150 Primary generalized (osteo)arthritis: Principal | ICD-10-CM

## 2018-01-26 DIAGNOSIS — M159 Polyosteoarthritis, unspecified: Secondary | ICD-10-CM

## 2018-01-26 MED ORDER — BRIMONIDINE TARTRATE-TIMOLOL 0.2-0.5 % OP SOLN: 1.0000 [drp] | Freq: Two times a day (BID) | OPHTHALMIC | 1 refills | Status: DC

## 2018-01-26 MED ORDER — HYDROCODONE-ACETAMINOPHEN 10-325 MG PO TABS: 1.0000 | ORAL_TABLET | Freq: Three times a day (TID) | ORAL | 0 refills | Status: DC | PRN

## 2018-01-26 NOTE — Telephone Encounter (Signed)
Requested Prescriptions  Pending Prescriptions Disp Refills  . HYDROcodone-acetaminophen (NORCO) 10-325 MG tablet 90 tablet 0    Sig: Take 1 tablet by mouth every 8 (eight) hours as needed.     Not Delegated - Analgesics:  Opioid Agonist Combinations Failed - 01/26/2018  9:38 AM      Failed - This refill cannot be delegated      Failed - Urine Drug Screen completed in last 360 days.      Passed - Valid encounter within last 6 months    Recent Outpatient Visits          4 months ago Type 2 diabetes mellitus without complication, without long-term current use of insulin ([place])   [place] Kelly, David W, MD   10 months ago Cough   [facility] -Georges Mouse, MD   1 year ago Preventative health care   [facility] -Georges Mouse, MD   1 year ago Gastrointestinal hemorrhage, unspecified gastrointestinal hemorrhage type   [place], [place]   1 year ago Primary osteoarthritis involving multiple joints   [place], FNP      Future Appointments            In 3 days Biagio Borg, MD [place], [facility]         . brimonidine-timolol (COMBIGAN) 0.2-0.5 % ophthalmic solution 15 mL 1    Sig: Place 1 drop into both eyes every 12 (twelve) hours.     Ophthalmology:  Glaucoma Passed - 01/26/2018  9:38 AM      Passed - Valid encounter within last 12 months    Recent Outpatient Visits          4 months ago Type 2 diabetes mellitus without complication, without long-term current use of insulin [facility])   [place] [facility] -Georges Mouse, MD   10 months ago Cough   [place] [facility] -Georges Mouse, MD   1 year ago Preventative health care   [facility] -Georges Mouse, MD   1 year ago Gastrointestinal hemorrhage, unspecified gastrointestinal  hemorrhage type   [place], [place]   1 year ago Primary osteoarthritis involving multiple joints   [place], FNP      Future Appointments            In 3 days [name], Hunt Oris, MD [place], [facility]

## 2018-01-26 NOTE — Telephone Encounter (Signed)
Requested medication (s) are due for refill today: yes  Requested medication (s) are on the active medication list: yes  Last refill:  12/27/17 for 90 tabs  Future visit scheduled: yes  Notes to clinic:  Opioid agonist combinations failed. No urine drug screen.  Med can not be delegated.  Requested Prescriptions  Pending Prescriptions Disp Refills   HYDROcodone-acetaminophen (NORCO) 10-325 MG tablet 90 tablet 0    Sig: Take 1 tablet by mouth every 8 (eight) hours as needed.     Not Delegated - Analgesics:  Opioid Agonist Combinations Failed - 01/26/2018  9:38 AM      Failed - This refill cannot be delegated      Failed - Urine Drug Screen completed in last 360 days.      Passed - Valid encounter within last 6 months    Recent Outpatient Visits          4 months ago Type 2 diabetes mellitus without complication, without long-term current use of insulin ([place])   [place] John, James W, MD   10 months ago Cough   [place] [facility] -Georges Mouse, MD   1 year ago Preventative health care   [place] [facility] -Georges Mouse, MD   1 year ago Gastrointestinal hemorrhage, unspecified gastrointestinal hemorrhage type   [place], [place]   1 year ago Primary osteoarthritis involving multiple joints   [place], FNP      Future Appointments            In 3 days Biagio Borg, MD [place] [facility], [facility]         Signed Prescriptions Disp Refills   brimonidine-timolol (COMBIGAN) 0.2-0.5 % ophthalmic solution 15 mL 1    Sig: Place 1 drop into both eyes every 12 (twelve) hours.     Ophthalmology:  Glaucoma Passed - 01/26/2018  9:38 AM      Passed - Valid encounter within last 12 months    Recent Outpatient Visits          4 months ago Type 2 diabetes mellitus without complication, without long-term current use  of insulin [facility])   [place] [facility] -Georges Mouse, MD   10 months ago Cough   [place] [facility] -Georges Mouse, MD   1 year ago Preventative health care   [facility] -Georges Mouse, MD   1 year ago Gastrointestinal hemorrhage, unspecified gastrointestinal hemorrhage type   [place], [place]   1 year ago Primary osteoarthritis involving multiple joints   [place], FNP      Future Appointments            In 3 days [name], Hunt Oris, MD [place], [facility]

## 2018-01-26 NOTE — Telephone Encounter (Signed)
appt made

## 2018-01-26 NOTE — Telephone Encounter (Unsigned)
Copied from [place] [id number]. Topic: Quick Communication - Rx Refill/Question >> Jan 26, 2018  9:14 AM Antonieta Iba C wrote: Medication: HYDROcodone-acetaminophen (NORCO) 10-325 MG tablet  and also brimonidine-timolol (COMBIGAN) 0.2-0.5 % ophthalmic solution  Has the patient contacted their pharmacy? Yes  (Agent: If no, request that the patient contact the pharmacy for the refill.) (Agent: If yes, when and what did the pharmacy advise?)  Preferred Pharmacy (with phone number or street name): [facility] [place]: Please be advised that RX refills may take up to 3 business days. We ask that you follow-up with your pharmacy.

## 2018-01-26 NOTE — Telephone Encounter (Signed)
Done erx 

## 2018-01-29 ENCOUNTER — Ambulatory Visit: Payer: Medicare Other | Admitting: Internal Medicine

## 2018-02-15 ENCOUNTER — Ambulatory Visit: Payer: Medicare Other | Admitting: Internal Medicine

## 2018-02-26 ENCOUNTER — Other Ambulatory Visit: Payer: Self-pay | Admitting: Internal Medicine

## 2018-02-26 DIAGNOSIS — M150 Primary generalized (osteo)arthritis: Principal | ICD-10-CM

## 2018-02-26 DIAGNOSIS — M159 Polyosteoarthritis, unspecified: Secondary | ICD-10-CM

## 2018-02-28 ENCOUNTER — Telehealth: Payer: Self-pay | Admitting: Internal Medicine

## 2018-02-28 ENCOUNTER — Ambulatory Visit: Payer: Self-pay | Admitting: *Deleted

## 2018-02-28 NOTE — Telephone Encounter (Signed)
Copied from [place] [id number]. Topic: Quick Communication - Rx Refill/Question >> Feb 28, 2018 10:16 AM Carolyn Stare wrote: Medication     promethazine-codeine (PHENERGAN WITH CODEINE) 6.25-10 MG/5ML syrup    pt said this med help him and is asking for a refill   Hydrocodone 10-325   Preferred Pharmacy  [facility]   Agent: Please be advised that RX refills may take up to 3 business days. We ask that you follow-up with your pharmacy.

## 2018-02-28 NOTE — Telephone Encounter (Signed)
The pt says he has a persistent cough that has been getting worse over the past 8 months; pt request refill of his cough syrup (promethazine-codeine); he says that he was told to only get cough medication from his doctor because he is diabetic; (also see telephone encounter dated 02/28/2018);  the pt says that his cough is productive at times; he also has headaches due to his sinuses; the pt says that he did get a flu shot this year; nurse triage initiated, and recommendations made per protocol; pt requested appointment on 03/12/2018 because he has to arrange to have someone to bring him; pt offered and accepted appointment with Dr Cathlean Cower, 03/12/2018 at 1400; he verbalized understanding; will route to office for notification.    Reason for Disposition . Cough lasts > 3 weeks  Answer Assessment - Initial Assessment Questions 1. ONSET: "When did the cough begin?"      Worsening over the past 8 months 2. SEVERITY: "How bad is the cough today?"      Moderate; worse at night and in the morning 3. RESPIRATORY DISTRESS: "Describe your breathing."      Shortness of breath since last year 4. FEVER: "Do you have a fever?" If so, ask: "What is your temperature, how was it measured, and when did it start?"     no 5. SPUTUM: "Describe the color of your sputum" (e.g., clear, white, yellow, green), "Has there been any change recently?"     Dark yellow 6. HEMOPTYSIS: "Are you coughing up any blood?" If so ask: "How much, flecks, streaks, tablespoons, etc.?"    no 7. CARDIAC HISTORY: "Do you have any history of heart disease?" (e.g., heart attack, congestive heart failure)      no 8. LUNG HISTORY: "Do you have any history of lung disease?"  (e.g., pulmonary embolus, asthma, emphysema/COPD)     History of bronchitis per pt 9. OTHER SYMPTOMS: "Do you have any other symptoms? (e.g., runny nose, wheezing, chest pain)     Runny nose 10. PREGNANCY: "Is there any chance you are pregnant?" "When was your last  menstrual period?"       n/a 11. TRAVEL: "Have you traveled out of the country in the last month?" (e.g., travel history, exposures)       no  Protocols used: COUGH - CHRONIC-A-AH

## 2018-02-28 NOTE — Telephone Encounter (Signed)
This medication is not normally refillable on request, and only used temporarily for acute problems.  It really should not be used for chronic cough.  Pt would need ROV to assess if still felt needed.. Otherwise please try OTC delsym

## 2018-02-28 NOTE — Telephone Encounter (Signed)
Pt has an appt schedule.

## 2018-02-28 NOTE — Telephone Encounter (Signed)
Contacted pt regarding his symptoms (see nurse triage note dated 02/28/2018); pt request refill of his cough syrup (promethazine-codeine); he says that he was told to only get cough medication from his doctor because he is diabetic; he also request refill on his hydrocodone-acetaminophen 10-325; he verifies that he uses [facility], [place], [place]; he can be contacted at [phone]; will route to office for final disposition.  Requested medication (s) are due for refill today: yes hydrocodone-acetaminophen 10-325   Requested medication (s) are on the active medication list: yes  Last refill:  01/26/18  Future visit scheduled: yes  Notes to clinic:  Not delegated   Requested medication (s) are due for refill today: ? Promethazine-codeine Requested medication (s) are on the active medication list: no    Last refill: 03/31/17  Future visit scheduled: yes  Notes to clinic:  not delegated; prescription original written by Terri Piedra

## 2018-03-01 ENCOUNTER — Other Ambulatory Visit: Payer: Self-pay | Admitting: Internal Medicine

## 2018-03-01 DIAGNOSIS — M150 Primary generalized (osteo)arthritis: Principal | ICD-10-CM

## 2018-03-01 DIAGNOSIS — M159 Polyosteoarthritis, unspecified: Secondary | ICD-10-CM

## 2018-03-01 NOTE — Telephone Encounter (Signed)
Pt has appt scheduled.

## 2018-03-01 NOTE — Telephone Encounter (Signed)
Pt calling in to find out why medication is being denied.  Pt states he needs his pain medication and he has been completely out for over a week now. Pt can be reached at [phone]

## 2018-03-01 NOTE — Telephone Encounter (Signed)
Pt has been informed of the 9/21 and 10/23 telephone conversation again. I reiterated that PCP will not be moving forward with filling that medication as he has instructed before due to him being referred to pain management. He stated that he needs his medication and that he wants to keep his scheduled appt to see Dr. [name] "face to face" and stated that he "is not changing his mind either".

## 2018-03-02 DIAGNOSIS — R51 Headache: Secondary | ICD-10-CM | POA: Diagnosis not present

## 2018-03-02 DIAGNOSIS — M25561 Pain in right knee: Secondary | ICD-10-CM | POA: Diagnosis not present

## 2018-03-02 DIAGNOSIS — G8929 Other chronic pain: Secondary | ICD-10-CM | POA: Diagnosis not present

## 2018-03-02 DIAGNOSIS — M25511 Pain in right shoulder: Secondary | ICD-10-CM | POA: Diagnosis not present

## 2018-03-02 MED ORDER — LIDOCAINE 4 % EX PTCH
1.00 | MEDICATED_PATCH | CUTANEOUS | Status: DC
Start: 2018-03-03 — End: 2018-03-02

## 2018-03-12 ENCOUNTER — Encounter: Payer: Self-pay | Admitting: Internal Medicine

## 2018-03-12 ENCOUNTER — Ambulatory Visit (INDEPENDENT_AMBULATORY_CARE_PROVIDER_SITE_OTHER): Payer: Medicare Other | Admitting: Internal Medicine

## 2018-03-12 VITALS — BP 118/72 | HR 78 | Temp 98.6°F | Ht 72.0 in | Wt 215.0 lb

## 2018-03-12 DIAGNOSIS — G8929 Other chronic pain: Secondary | ICD-10-CM

## 2018-03-12 DIAGNOSIS — E785 Hyperlipidemia, unspecified: Secondary | ICD-10-CM | POA: Diagnosis not present

## 2018-03-12 DIAGNOSIS — E119 Type 2 diabetes mellitus without complications: Secondary | ICD-10-CM | POA: Diagnosis not present

## 2018-03-12 DIAGNOSIS — Z0000 Encounter for general adult medical examination without abnormal findings: Secondary | ICD-10-CM

## 2018-03-12 LAB — POCT GLYCOSYLATED HEMOGLOBIN (HGB A1C): Hemoglobin A1C: 7.3 % — AB (ref 4.0–5.6)

## 2018-03-12 MED ORDER — TRIAMCINOLONE ACETONIDE 0.5 % EX OINT: 1.0000 "application " | TOPICAL_OINTMENT | Freq: Two times a day (BID) | CUTANEOUS | 2 refills | Status: DC

## 2018-03-12 MED ORDER — ATORVASTATIN CALCIUM 40 MG PO TABS: 40.0000 mg | ORAL_TABLET | Freq: Every day | ORAL | 3 refills | Status: DC

## 2018-03-12 MED ORDER — PROMETHAZINE-CODEINE 6.25-10 MG/5ML PO SYRP: 5.0000 mL | ORAL_SOLUTION | ORAL | 0 refills | Status: DC | PRN

## 2018-03-12 NOTE — Assessment & Plan Note (Signed)

## 2018-03-12 NOTE — Patient Instructions (Signed)
Please continue all other medications as before, and refills have been done if requested - including the cough medicine, lipitor, and ointment for the ears  Please have the pharmacy call with any other refills you may need.  Please continue your efforts at being more active, low cholesterol diet, and weight control.  Please keep your appointments with your specialists as you may have planned  Your A1c was OK today  You will be contacted regarding the referral for: Pain Management  Please return in 6 months, or sooner if needed, with Lab testing done 3-5 days before

## 2018-03-12 NOTE — Assessment & Plan Note (Signed)
D/w pt, I do not treat chronic pain with schedule III or higher narcotics, so I can no longer provide his overall pain management, so he is asking for pain clinic referral

## 2018-03-12 NOTE — Assessment & Plan Note (Signed)
stable overall by history and exam, recent data reviewed with pt, and pt to continue medical treatment as before,  to f/u any worsening symptoms or concerns  

## 2018-03-12 NOTE — Progress Notes (Signed)
Subjective:    Patient ID: David Kelly, male    DOB: Jan 16, 1946, 73 y.o.   MRN: [id number]  HPI  Here for wellness and f/u;  Overall doing ok;  Pt denies Chest pain, worsening SOB, DOE, wheezing, orthopnea, PND, worsening LE edema, palpitations, dizziness or syncope.  Pt denies neurological change such as new headache, facial or extremity weakness.  Pt denies polydipsia, polyuria, or low sugar symptoms. Pt states overall good compliance with treatment and medications, good tolerability, and has been trying to follow appropriate diet.  Pt denies worsening depressive symptoms, suicidal ideation or panic. No fever, night sweats, wt loss, loss of appetite, or other constitutional symptoms.  Pt states good ability with ADL's, has low fall risk, home safety reviewed and adequate, no other significant changes in hearing or vision, and only occasionally active with exercise.  Pt continues to have recurring LBP without change in severity, bowel or bladder change, fever, wt loss,  worsening LE pain/numbness/weakness, gait change or falls.  Asks for pain clinic referral as pain is now just too chronic and severe.  Has chronic cough, worse at night, not helped with allergy tx or antacid and no wheezing.  Asks for refill of prometh-codeine syrup originally rx per [facility] NP in may 2018, still has the bottle with small amount left Past Medical History:  Diagnosis Date  . Arthritis   . BPH (benign prostatic hypertrophy)   . Diabetes mellitus    DX  5 YR AGO  . Glaucoma   . HLD (hyperlipidemia) 09/28/2017  . Kidney stones    Past Surgical History:  Procedure Laterality Date  . CYSTOSCOPY WITH RETROGRADE PYELOGRAM, URETEROSCOPY AND STENT PLACEMENT Right 05/20/2016   Procedure: CYSTOSCOPY, URETEROSCOPY, TRANSURETHRAL INCISION OF URETHROCELE, BASKET STONE REMOVAL;  Surgeon: Irine Seal, MD;  Location: [facility];  Service: Urology;  Laterality: Right;  . left total knee replacement    . TOTAL KNEE ARTHROPLASTY   04/04/2011   Procedure: TOTAL KNEE ARTHROPLASTY;  Surgeon: Rudean Haskell, MD;  Location: [place];  Service: Orthopedics;  Laterality: Left;    reports that he quit smoking about 11 years ago. His smoking use included cigarettes. He has a 67.50 pack-year smoking history. His smokeless tobacco use includes chew. He reports that he does not drink alcohol or use drugs. family history includes Arthritis in his mother; Asthma in his paternal grandfather; Diabetes in his sister. No Known Allergies Current Outpatient Medications on File Prior to Visit  Medication Sig Dispense Refill  . aspirin EC 81 MG tablet Take 81 mg by mouth at bedtime.    . brimonidine-timolol (COMBIGAN) 0.2-0.5 % ophthalmic solution Place 1 drop into both eyes every 12 (twelve) hours. 15 mL 1  . diclofenac sodium (VOLTAREN) 1 % GEL Apply 1 application topically 3 (three) times daily. Right knee and both shoulders. 3 Tube 4  . Fluocinolone Acetonide 0.01 % OIL Place 5 drops in ear(s) 2 (two) times daily. 20 mL 1  . glipiZIDE (GLUCOTROL XL) 5 MG 24 hr tablet Take 1 tablet (5 mg total) by mouth daily with breakfast. 90 tablet 3  . glucose blood (IGLUCOSE TEST STRIPS) test strip Use as instructed 100 each 12  . HYDROcodone-acetaminophen (NORCO) 10-325 MG tablet Take 1 tablet by mouth every 8 (eight) hours as needed. 90 tablet 0  . metFORMIN (GLUCOPHAGE) 1000 MG tablet Take 1 tablet (1,000 mg total) by mouth 2 (two) times daily with a meal. Must keep appt w/new provider for future refills 180  tablet 3  . montelukast (SINGULAIR) 10 MG tablet Take 1 tablet (10 mg total) by mouth at bedtime. 90 tablet 3  . Multiple Vitamin (MULTIVITAMIN WITH MINERALS) TABS tablet Take 1 tablet by mouth daily.    . naproxen (NAPROSYN) 500 MG tablet Take by mouth.    . tamsulosin (FLOMAX) 0.4 MG CAPS capsule Take 1 capsule (0.4 mg total) by mouth at bedtime. 90 capsule 3   No current facility-administered medications on file prior to visit.    Review of  Systems  Constitutional: Negative for other unusual diaphoresis or sweats HENT: Negative for ear discharge or swelling Eyes: Negative for other worsening visual disturbances Respiratory: Negative for stridor or other swelling  Gastrointestinal: Negative for worsening distension or other blood Genitourinary: Negative for retention or other urinary change Musculoskeletal: Negative for other MSK pain or swelling Skin: Negative for color change or other new lesions Neurological: Negative for worsening tremors and other numbness  Psychiatric/Behavioral: Negative for worsening agitation or other fatigue All other system neg per pt    Objective:   Physical Exam BP 118/72   Pulse 78   Temp 98.6 F (37 C) (Oral)   Ht 6' (1.829 m)   Wt 215 lb (97.5 kg)   SpO2 97%   BMI 29.16 kg/m  VS noted,  Constitutional: Pt appears in NAD HENT: Head: NCAT.  Right Ear: External ear normal.  Left Ear: External ear normal.  Eyes: . Pupils are equal, round, and reactive to light. Conjunctivae and EOM are normal Nose: without d/c or deformity Neck: Neck supple. Gross normal ROM Cardiovascular: Normal rate and regular rhythm.   Pulmonary/Chest: Effort normal and breath sounds without rales or wheezing.  Abd:  Soft, NT, ND, + BS, no organomegaly Neurological: Pt is alert. At baseline orientation, motor grossly intact Skin: Skin is warm. No rashes, other new lesions, no LE edema Psychiatric: Pt behavior is normal without agitation  No other exam findings  POCT glycosylated hemoglobin (Hb A1C)    Ref Range & Units 15:02 (03/12/18) 58mo ago (09/28/17) 32mo ago (03/31/17) 77yr ago (12/13/16) 67yr ago (09/23/15) 51yr ago (07/15/15)  Hemoglobin A1C 4.0 - 5.6 % 7.3Abnormal   7.4High  R, CM 8.6High  R, CM 6.8High  R, CM 6.7High  R, CM 6.8High  R, CM           Assessment & Plan:

## 2018-03-13 ENCOUNTER — Other Ambulatory Visit: Payer: Self-pay

## 2018-03-13 NOTE — Patient Outreach (Signed)
[facility]) Care Management  03/13/2018  DONA KLEMANN [birth year] [id number]   Referral Date: 03/13/2018 Referral Source: Um referral Referral Reason: Assistance with copayments   Outreach Attempt: spoke with patient.  He is able to verify HIPAA.  Discussed with patient reason for referral.  Asked patient if there was one medication costing more than others.  He states he is not sure.  Asked patient if he knew the cost of his medications per month. Patient states that he pays all together about $50/month for medication.  Asked patient if he ever applied for medicaid. He states he does have medicaid.  Explained to patient that Medicare and Medicaid together help with medication cost and that he would not qualify for medication assistance programs due to his out of pocket expenses.  He verbalized understanding.  Patient declines any other questions, concerns, or needs at this time.    Plan: RN CM will close case.     Jone Baseman, RN, MSN [facility] Care Management Care Management Coordinator Direct Line [phone] Toll Free: [phone]  Fax: [phone]

## 2018-03-15 ENCOUNTER — Other Ambulatory Visit: Payer: Self-pay | Admitting: Internal Medicine

## 2018-03-15 DIAGNOSIS — M150 Primary generalized (osteo)arthritis: Principal | ICD-10-CM

## 2018-03-15 DIAGNOSIS — M159 Polyosteoarthritis, unspecified: Secondary | ICD-10-CM

## 2018-03-22 ENCOUNTER — Ambulatory Visit (INDEPENDENT_AMBULATORY_CARE_PROVIDER_SITE_OTHER): Payer: Medicare Other | Admitting: Sports Medicine

## 2018-03-22 ENCOUNTER — Encounter: Payer: Self-pay | Admitting: Sports Medicine

## 2018-03-22 VITALS — BP 134/78 | HR 101 | Resp 16

## 2018-03-22 DIAGNOSIS — M79676 Pain in unspecified toe(s): Secondary | ICD-10-CM

## 2018-03-22 DIAGNOSIS — E119 Type 2 diabetes mellitus without complications: Secondary | ICD-10-CM | POA: Diagnosis not present

## 2018-03-22 DIAGNOSIS — B351 Tinea unguium: Secondary | ICD-10-CM | POA: Diagnosis not present

## 2018-03-22 NOTE — Progress Notes (Signed)
Subjective: David Kelly is a 73 y.o. male patient with history of diabetes who returns to office today complaining of long, painful nails  while ambulating in shoes; unable to trim. Patient states that the glucose reading today was 137mg /dl. Patient denies any new changes in medication or new problems. Patient denies any other pedal complaints at this time.  Saw primary care Dr. Cathlean Cower 2 weeks ago.  Patient Active Problem List   Diagnosis Date Noted  . HLD (hyperlipidemia) 09/28/2017  . Cough 03/31/2017  . Primary osteoarthritis of right knee 03/15/2017  . Unspecified injury of lower back, sequela 03/15/2017  . Bilateral rotator cuff syndrome 03/15/2017  . Recurrent falls while walking 12/13/2016  . Preventative health care 12/13/2016  . Chronic pain 12/13/2016  . Kidney stones   . Glaucoma   . Gastrointestinal bleed 06/28/2016  . Dysuria 10/28/2015  . Itching of ear 08/13/2015  . Type 2 diabetes mellitus ([place]) 07/15/2015  . Osteoarthritis 07/15/2015   Current Outpatient Medications on File Prior to Visit  Medication Sig Dispense Refill  . aspirin EC 81 MG tablet Take 81 mg by mouth at bedtime.    Marland Kitchen atorvastatin (LIPITOR) 40 MG tablet Take 1 tablet (40 mg total) by mouth daily. 90 tablet 3  . brimonidine-timolol (COMBIGAN) 0.2-0.5 % ophthalmic solution Place 1 drop into both eyes every 12 (twelve) hours. 15 mL 1  . diclofenac sodium (VOLTAREN) 1 % GEL Apply 1 application topically 3 (three) times daily. Right knee and both shoulders. 3 Tube 4  . Fluocinolone Acetonide 0.01 % OIL Place 5 drops in ear(s) 2 (two) times daily. 20 mL 1  . glipiZIDE (GLUCOTROL XL) 5 MG 24 hr tablet Take 1 tablet (5 mg total) by mouth daily with breakfast. 90 tablet 3  . glucose blood (IGLUCOSE TEST STRIPS) test strip Use as instructed 100 each 12  . HYDROcodone-acetaminophen (NORCO) 10-325 MG tablet Take 1 tablet by mouth every 8 (eight) hours as needed. 90 tablet 0  . metFORMIN (GLUCOPHAGE)  1000 MG tablet Take 1 tablet (1,000 mg total) by mouth 2 (two) times daily with a meal. Must keep appt w/new provider for future refills 180 tablet 3  . montelukast (SINGULAIR) 10 MG tablet Take 1 tablet (10 mg total) by mouth at bedtime. 90 tablet 3  . Multiple Vitamin (MULTIVITAMIN WITH MINERALS) TABS tablet Take 1 tablet by mouth daily.    . naproxen (NAPROSYN) 500 MG tablet Take by mouth.    . promethazine-codeine (PHENERGAN WITH CODEINE) 6.25-10 MG/5ML syrup Take 5 mLs by mouth every 4 (four) hours as needed for up to 10 days for cough. 180 mL 0  . tamsulosin (FLOMAX) 0.4 MG CAPS capsule Take 1 capsule (0.4 mg total) by mouth at bedtime. 90 capsule 3  . triamcinolone ointment (KENALOG) 0.5 % Apply 1 application topically 2 (two) times daily. 30 g 2   No current facility-administered medications on file prior to visit.    No Known Allergies  Recent Results (from the past 2160 hour(s))  POCT glycosylated hemoglobin (Hb A1C)     Status: Abnormal   Collection Time: 03/12/18  3:02 PM  Result Value Ref Range   Hemoglobin A1C 7.3 (A) 4.0 - 5.6 %   HbA1c POC (<> result, manual entry)     HbA1c, POC (prediabetic range)     HbA1c, POC (controlled diabetic range)      Objective: General: Patient is awake, alert, and oriented x 3 and in no acute distress. No acute  changes with physical exam  Integument: Skin is warm, dry and supple bilateral. Nails are tender, mildly elongated, thickened and dystrophic with subungual debris, consistent with onychomycosis, 1-5 bilateral. No signs of infection. No open lesions or preulcerative lesions present bilateral.  Remaining integument unremarkable.  Vasculature:  Dorsalis Pedis pulse 2/4 bilateral. Posterior Tibial pulse 1 /4 bilateral. Capillary fill time <3 sec 1-5 bilateral. Positive hair growth to the level of the digits.Temperature gradient within normal limits. No varicosities present bilateral. No edema present bilateral.   Neurology: The patient  has intact sensation measured with a 5.07/10g Semmes Weinstein Monofilament at all pedal sites bilateral . Vibratory sensation diminished bilateral with tuning fork. No Babinski sign present bilateral.   Musculoskeletal: Asymptomatic bunion and hammertoe pedal deformities noted bilateral. Muscular strength 5/5 in all lower extremity muscular groups bilateral without pain on range of motion. No tenderness with calf compression bilateral.  Assessment and Plan: Problem List Items Addressed This Visit    None    Visit Diagnoses    Pain due to onychomycosis of toenail    -  Primary   Diabetes mellitus without complication ([place])         -Examined patient. -Discussed and educated patient on diabetic foot care -Mechanically debrided all nails 1-5 bilateral using sterile nail nipper and filed with dremel without incident -Patient to return  in 3 months for at risk foot care -Patient advised to call the office if any problems or questions arise in the meantime.  Landis Martins, DPM

## 2018-04-02 ENCOUNTER — Telehealth: Payer: Self-pay

## 2018-04-02 NOTE — Telephone Encounter (Signed)
Copied from [place] [id number]. Topic: General - Inquiry >> Mar 29, 2018  3:49 PM Vernona Rieger wrote: Reason for CRM:  [facility] Nurse Practitioner with [employer] called and said that he advised her he has some uncontrollable pain with his left knee. She thinks that maybe he is a drug seeker and so she placed a referral with [place] [employer], maintenance. She wants Dr [name] to be aware of this and would like the nurse to call her back. [phone]

## 2018-04-13 ENCOUNTER — Telehealth: Payer: Self-pay | Admitting: Internal Medicine

## 2018-04-13 NOTE — Telephone Encounter (Signed)
He still declines to refill medication.

## 2018-04-13 NOTE — Telephone Encounter (Signed)
Pt returned my call regarding his pain mgmt referral.   He states that he is in a lot of pain and would like a refill on his Hydrocodone.   I explained to him that Dr. [name] doesn't treat chronic pain and it is important for him to see the pain specialist.   I am referring him to a clinic closer to his home.   Can you please refill till he can get an appointment

## 2018-04-13 NOTE — Telephone Encounter (Signed)
Can you please call him and tell him

## 2018-04-13 NOTE — Telephone Encounter (Signed)
Pt has been informed again that PCP will not refill this medication anymore and expressed understanding.

## 2018-04-27 DIAGNOSIS — M25561 Pain in right knee: Secondary | ICD-10-CM | POA: Diagnosis not present

## 2018-04-27 DIAGNOSIS — M47816 Spondylosis without myelopathy or radiculopathy, lumbar region: Secondary | ICD-10-CM | POA: Diagnosis not present

## 2018-04-27 DIAGNOSIS — M545 Low back pain: Secondary | ICD-10-CM | POA: Diagnosis not present

## 2018-04-27 DIAGNOSIS — I700 Atherosclerosis of aorta: Secondary | ICD-10-CM | POA: Diagnosis not present

## 2018-04-27 DIAGNOSIS — E1165 Type 2 diabetes mellitus with hyperglycemia: Secondary | ICD-10-CM | POA: Diagnosis not present

## 2018-04-27 DIAGNOSIS — G8929 Other chronic pain: Secondary | ICD-10-CM | POA: Diagnosis not present

## 2018-04-27 DIAGNOSIS — M1711 Unilateral primary osteoarthritis, right knee: Secondary | ICD-10-CM | POA: Diagnosis not present

## 2018-04-27 DIAGNOSIS — R0602 Shortness of breath: Secondary | ICD-10-CM | POA: Diagnosis not present

## 2018-06-14 DIAGNOSIS — M1711 Unilateral primary osteoarthritis, right knee: Secondary | ICD-10-CM | POA: Diagnosis not present

## 2018-06-14 DIAGNOSIS — G8929 Other chronic pain: Secondary | ICD-10-CM | POA: Diagnosis not present

## 2018-06-14 DIAGNOSIS — M545 Low back pain: Secondary | ICD-10-CM | POA: Diagnosis not present

## 2018-06-14 DIAGNOSIS — E119 Type 2 diabetes mellitus without complications: Secondary | ICD-10-CM | POA: Diagnosis not present

## 2018-06-14 DIAGNOSIS — M5136 Other intervertebral disc degeneration, lumbar region: Secondary | ICD-10-CM | POA: Diagnosis not present

## 2018-06-14 DIAGNOSIS — Z79899 Other long term (current) drug therapy: Secondary | ICD-10-CM | POA: Diagnosis not present

## 2018-06-21 ENCOUNTER — Other Ambulatory Visit: Payer: Self-pay

## 2018-06-21 ENCOUNTER — Encounter: Payer: Self-pay | Admitting: Sports Medicine

## 2018-06-21 ENCOUNTER — Ambulatory Visit (INDEPENDENT_AMBULATORY_CARE_PROVIDER_SITE_OTHER): Payer: Medicare Other | Admitting: Sports Medicine

## 2018-06-21 VITALS — Temp 97.8°F | Resp 16

## 2018-06-21 DIAGNOSIS — M79676 Pain in unspecified toe(s): Secondary | ICD-10-CM | POA: Diagnosis not present

## 2018-06-21 DIAGNOSIS — B351 Tinea unguium: Secondary | ICD-10-CM

## 2018-06-21 DIAGNOSIS — E119 Type 2 diabetes mellitus without complications: Secondary | ICD-10-CM

## 2018-06-21 DIAGNOSIS — M2042 Other hammer toe(s) (acquired), left foot: Secondary | ICD-10-CM

## 2018-06-21 DIAGNOSIS — M21619 Bunion of unspecified foot: Secondary | ICD-10-CM

## 2018-06-21 DIAGNOSIS — M2140 Flat foot [pes planus] (acquired), unspecified foot: Secondary | ICD-10-CM

## 2018-06-21 DIAGNOSIS — M2041 Other hammer toe(s) (acquired), right foot: Secondary | ICD-10-CM

## 2018-06-21 NOTE — Progress Notes (Signed)
Subjective: David Kelly is a 73 y.o. male patient with history of diabetes who returns to office today complaining of long, painful nails while ambulating in shoes; unable to trim. Patient states that the glucose reading today was 152mg /dl. Patient denies any new changes in medication or new problems. Patient request diabetic shoes. Patient denies any other pedal complaints at this time.  Saw primary care Dr. Cathlean Cower last year  Patient Active Problem List   Diagnosis Date Noted  . HLD (hyperlipidemia) 09/28/2017  . Cough 03/31/2017  . Primary osteoarthritis of right knee 03/15/2017  . Unspecified injury of lower back, sequela 03/15/2017  . Bilateral rotator cuff syndrome 03/15/2017  . Recurrent falls while walking 12/13/2016  . Preventative health care 12/13/2016  . Chronic pain 12/13/2016  . Kidney stones   . Glaucoma   . Gastrointestinal bleed 06/28/2016  . Dysuria 10/28/2015  . Itching of ear 08/13/2015  . Type 2 diabetes mellitus ([place]) 07/15/2015  . Osteoarthritis 07/15/2015   Current Outpatient Medications on File Prior to Visit  Medication Sig Dispense Refill  . aspirin EC 81 MG tablet Take 81 mg by mouth at bedtime.    Marland Kitchen atorvastatin (LIPITOR) 40 MG tablet Take 1 tablet (40 mg total) by mouth daily. 90 tablet 3  . brimonidine-timolol (COMBIGAN) 0.2-0.5 % ophthalmic solution Place 1 drop into both eyes every 12 (twelve) hours. 15 mL 1  . diclofenac sodium (VOLTAREN) 1 % GEL Apply 1 application topically 3 (three) times daily. Right knee and both shoulders. 3 Tube 4  . Fluocinolone Acetonide 0.01 % OIL Place 5 drops in ear(s) 2 (two) times daily. 20 mL 1  . glipiZIDE (GLUCOTROL XL) 5 MG 24 hr tablet Take 1 tablet (5 mg total) by mouth daily with breakfast. 90 tablet 3  . glucose blood (IGLUCOSE TEST STRIPS) test strip Use as instructed 100 each 12  . HYDROcodone-acetaminophen (NORCO) 10-325 MG tablet Take 1 tablet by mouth every 8 (eight) hours as needed. 90 tablet 0   . metFORMIN (GLUCOPHAGE) 1000 MG tablet Take 1 tablet (1,000 mg total) by mouth 2 (two) times daily with a meal. Must keep appt w/new provider for future refills 180 tablet 3  . montelukast (SINGULAIR) 10 MG tablet Take 1 tablet (10 mg total) by mouth at bedtime. 90 tablet 3  . Multiple Vitamin (MULTIVITAMIN WITH MINERALS) TABS tablet Take 1 tablet by mouth daily.    . naproxen (NAPROSYN) 500 MG tablet Take by mouth.    . tamsulosin (FLOMAX) 0.4 MG CAPS capsule Take 1 capsule (0.4 mg total) by mouth at bedtime. 90 capsule 3  . triamcinolone ointment (KENALOG) 0.5 % Apply 1 application topically 2 (two) times daily. 30 g 2   No current facility-administered medications on file prior to visit.    No Known Allergies  No results found for this or any previous visit (from the past 2160 hour(s)).  Objective: General: Patient is awake, alert, and oriented x 3 and in no acute distress. No acute changes with physical exam  Integument: Skin is warm, dry and supple bilateral. Nails are tender, mildly elongated, thickened and dystrophic with subungual debris, consistent with onychomycosis, 1-5 bilateral. No signs of infection. No open lesions or preulcerative lesions present bilateral.  Remaining integument unremarkable.  Vasculature:  Dorsalis Pedis pulse 2/4 bilateral. Posterior Tibial pulse 1 /4 bilateral. Capillary fill time <3 sec 1-5 bilateral. Positive hair growth to the level of the digits.Temperature gradient within normal limits. No varicosities present bilateral. No edema  present bilateral.   Neurology: The patient has intact sensation measured with a 5.07/10g Semmes Weinstein Monofilament at all pedal sites bilateral . Vibratory sensation diminished bilateral with tuning fork. No Babinski sign present bilateral.   Musculoskeletal: Asymptomatic bunion and hammertoe and planus pedal deformities noted bilateral. Muscular strength 5/5 in all lower extremity muscular groups bilateral without pain  on range of motion. No tenderness with calf compression bilateral.  Assessment and Plan: Problem List Items Addressed This Visit    None    Visit Diagnoses    Pain due to onychomycosis of toenail    -  Primary   Diabetes mellitus without complication ([place])         -Examined patient. -Discussed and educated patient on diabetic foot care -Mechanically debrided all nails 1-5 bilateral using sterile nail nipper and filed with dremel without incident -[employer] diabetic shoe order form was completed; office to contact primary care for approval / certification;  Office to arrange shoe fitting and dispensing. -Patient to return  in 3 months for at risk foot care -Patient advised to call the office if any problems or questions arise in the meantime.  Landis Martins, DPM

## 2018-07-05 ENCOUNTER — Ambulatory Visit: Payer: Medicare Other | Admitting: Orthotics

## 2018-07-05 ENCOUNTER — Other Ambulatory Visit: Payer: Self-pay

## 2018-07-05 DIAGNOSIS — M21619 Bunion of unspecified foot: Secondary | ICD-10-CM

## 2018-07-05 DIAGNOSIS — E119 Type 2 diabetes mellitus without complications: Secondary | ICD-10-CM

## 2018-07-05 DIAGNOSIS — M2140 Flat foot [pes planus] (acquired), unspecified foot: Secondary | ICD-10-CM

## 2018-07-05 NOTE — Progress Notes (Signed)

## 2018-07-13 DIAGNOSIS — J3089 Other allergic rhinitis: Secondary | ICD-10-CM | POA: Diagnosis not present

## 2018-07-13 DIAGNOSIS — M5136 Other intervertebral disc degeneration, lumbar region: Secondary | ICD-10-CM | POA: Diagnosis not present

## 2018-07-13 DIAGNOSIS — Z79899 Other long term (current) drug therapy: Secondary | ICD-10-CM | POA: Diagnosis not present

## 2018-07-13 DIAGNOSIS — E119 Type 2 diabetes mellitus without complications: Secondary | ICD-10-CM | POA: Diagnosis not present

## 2018-07-13 DIAGNOSIS — Z79891 Long term (current) use of opiate analgesic: Secondary | ICD-10-CM | POA: Diagnosis not present

## 2018-07-26 ENCOUNTER — Other Ambulatory Visit: Payer: Self-pay | Admitting: Internal Medicine

## 2018-07-26 ENCOUNTER — Other Ambulatory Visit: Payer: Self-pay | Admitting: Family

## 2018-07-26 NOTE — Telephone Encounter (Signed)
Done erx 

## 2018-08-06 ENCOUNTER — Other Ambulatory Visit: Payer: Self-pay

## 2018-08-06 NOTE — Patient Outreach (Signed)
[facility]) Care Management  08/06/2018  David Kelly [birth year] [id number]   Medication Adherence call to [name] Compliant Voice message left with a call back number. [name] is showing past due on Atorvastatin 40 mg under [facility].  [place] Management Direct Dial [phone]  Fax [phone] [name]

## 2018-08-09 DIAGNOSIS — Z79899 Other long term (current) drug therapy: Secondary | ICD-10-CM | POA: Diagnosis not present

## 2018-08-09 DIAGNOSIS — M5136 Other intervertebral disc degeneration, lumbar region: Secondary | ICD-10-CM | POA: Diagnosis not present

## 2018-08-09 DIAGNOSIS — E119 Type 2 diabetes mellitus without complications: Secondary | ICD-10-CM | POA: Diagnosis not present

## 2018-08-09 DIAGNOSIS — M159 Polyosteoarthritis, unspecified: Secondary | ICD-10-CM | POA: Diagnosis not present

## 2018-09-01 DIAGNOSIS — L03011 Cellulitis of right finger: Secondary | ICD-10-CM | POA: Diagnosis not present

## 2018-09-06 ENCOUNTER — Encounter: Payer: Self-pay | Admitting: Internal Medicine

## 2018-09-06 ENCOUNTER — Ambulatory Visit (INDEPENDENT_AMBULATORY_CARE_PROVIDER_SITE_OTHER): Payer: Medicare Other | Admitting: Internal Medicine

## 2018-09-06 ENCOUNTER — Other Ambulatory Visit: Payer: Self-pay

## 2018-09-06 VITALS — BP 124/78 | HR 81 | Temp 98.3°F | Ht 72.0 in | Wt 208.0 lb

## 2018-09-06 DIAGNOSIS — E559 Vitamin D deficiency, unspecified: Secondary | ICD-10-CM

## 2018-09-06 DIAGNOSIS — L02519 Cutaneous abscess of unspecified hand: Secondary | ICD-10-CM

## 2018-09-06 DIAGNOSIS — E785 Hyperlipidemia, unspecified: Secondary | ICD-10-CM | POA: Diagnosis not present

## 2018-09-06 DIAGNOSIS — Z0000 Encounter for general adult medical examination without abnormal findings: Secondary | ICD-10-CM | POA: Diagnosis not present

## 2018-09-06 DIAGNOSIS — E119 Type 2 diabetes mellitus without complications: Secondary | ICD-10-CM

## 2018-09-06 DIAGNOSIS — E538 Deficiency of other specified B group vitamins: Secondary | ICD-10-CM

## 2018-09-06 DIAGNOSIS — E611 Iron deficiency: Secondary | ICD-10-CM

## 2018-09-06 LAB — POCT GLYCOSYLATED HEMOGLOBIN (HGB A1C): Hemoglobin A1C: 7.6 % — AB (ref 4.0–5.6)

## 2018-09-06 NOTE — Progress Notes (Signed)
Subjective:    Patient ID: David Kelly, male    DOB: 1945-11-25, 73 y.o.   MRN: [id number]  HPI    Here to f/u recent 4th finger right hand abscess drained in ED, nearly finished antibiotic and overall seems resolved with no residual redness, swelling, tender or drainage.   Pt denies chest pain, increasing sob or doe, wheezing, orthopnea, PND, increased LE swelling, palpitations, dizziness or syncope.  Pt denies new neurological symptoms such as new headache, or facial or extremity weakness or numbness.  Pt denies polydipsia, polyuria.  Has optho appt for august.  No new complaints. Past Medical History:  Diagnosis Date  . Arthritis   . BPH (benign prostatic hypertrophy)   . Diabetes mellitus    DX  5 YR AGO  . Glaucoma   . HLD (hyperlipidemia) 09/28/2017  . Kidney stones    Past Surgical History:  Procedure Laterality Date  . CYSTOSCOPY WITH RETROGRADE PYELOGRAM, URETEROSCOPY AND STENT PLACEMENT Right 05/20/2016   Procedure: CYSTOSCOPY, URETEROSCOPY, TRANSURETHRAL INCISION OF URETHROCELE, BASKET STONE REMOVAL;  Surgeon: Irine Seal, MD;  Location: [facility];  Service: Urology;  Laterality: Right;  . left total knee replacement    . TOTAL KNEE ARTHROPLASTY  04/04/2011   Procedure: TOTAL KNEE ARTHROPLASTY;  Surgeon: Rudean Haskell, MD;  Location: [place];  Service: Orthopedics;  Laterality: Left;    reports that he quit smoking about 12 years ago. His smoking use included cigarettes. He has a 67.50 pack-year smoking history. His smokeless tobacco use includes chew. He reports that he does not drink alcohol or use drugs. family history includes Arthritis in his mother; Asthma in his paternal grandfather; Diabetes in his sister. No Known Allergies Current Outpatient Medications on File Prior to Visit  Medication Sig Dispense Refill  . aspirin EC 81 MG tablet Take 81 mg by mouth at bedtime.    Marland Kitchen atorvastatin (LIPITOR) 40 MG tablet Take 1 tablet (40 mg total) by mouth daily. 90 tablet 3  .  brimonidine-timolol (COMBIGAN) 0.2-0.5 % ophthalmic solution Place 1 drop into both eyes every 12 (twelve) hours. 15 mL 1  . diclofenac sodium (VOLTAREN) 1 % GEL Apply 1 application topically 3 (three) times daily. Right knee and both shoulders. 3 Tube 4  . Fluocinolone Acetonide 0.01 % OIL Place 5 drops in ear(s) 2 (two) times daily. 20 mL 1  . glipiZIDE (GLUCOTROL XL) 5 MG 24 hr tablet Take 1 tablet (5 mg total) by mouth daily with breakfast. 90 tablet 3  . glucose blood (IGLUCOSE TEST STRIPS) test strip Use as instructed 100 each 12  . HYDROcodone-acetaminophen (NORCO) 10-325 MG tablet Take 1 tablet by mouth every 8 (eight) hours as needed. 90 tablet 0  . metFORMIN (GLUCOPHAGE) 1000 MG tablet Take 1 tablet (1,000 mg total) by mouth 2 (two) times daily with a meal. Must keep appt w/new provider for future refills 180 tablet 3  . montelukast (SINGULAIR) 10 MG tablet Take 1 tablet (10 mg total) by mouth at bedtime. 90 tablet 3  . Multiple Vitamin (MULTIVITAMIN WITH MINERALS) TABS tablet Take 1 tablet by mouth daily.    . naproxen (NAPROSYN) 500 MG tablet Take by mouth.    . Promethazine-Codeine 6.25-10 MG/5ML SOLN TAKE 1 TEASPOONFUL BY MOUTH EVERY 4 HOURS AS NEEDED FOR COUGH 180 mL 0  . tamsulosin (FLOMAX) 0.4 MG CAPS capsule Take 1 capsule (0.4 mg total) by mouth at bedtime. 90 capsule 3  . triamcinolone ointment (KENALOG) 0.5 % Apply 1  application topically 2 (two) times daily. 30 g 2   No current facility-administered medications on file prior to visit.     Review of Systems  Constitutional: Negative for other unusual diaphoresis or sweats HENT: Negative for ear discharge or swelling Eyes: Negative for other worsening visual disturbances Respiratory: Negative for stridor or other swelling  Gastrointestinal: Negative for worsening distension or other blood Genitourinary: Negative for retention or other urinary change Musculoskeletal: Negative for other MSK pain or swelling Skin: Negative  for color change or other new lesions Neurological: Negative for worsening tremors and other numbness  Psychiatric/Behavioral: Negative for worsening agitation or other fatigue All other system neg per pt    Objective:   Physical Exam BP 124/78   Pulse 81   Temp 98.3 F (36.8 C) (Oral)   Ht 6' (1.829 m)   Wt 208 lb (94.3 kg)   SpO2 98%   BMI 28.21 kg/m  VS noted,  Constitutional: Pt appears in NAD HENT: Head: NCAT.  Right Ear: External ear normal.  Left Ear: External ear normal.  Eyes: . Pupils are equal, round, and reactive to light. Conjunctivae and EOM are normal Nose: without d/c or deformity Neck: Neck supple. Gross normal ROM Cardiovascular: Normal rate and regular rhythm.   Pulmonary/Chest: Effort normal and breath sounds without rales or wheezing.  Abd:  Soft, NT, ND, + BS, no organomegaly Neurological: Pt is alert. At baseline orientation, motor grossly intact Skin: Skin is warm. No rashes, other new lesions, no LE edema Psychiatric: Pt behavior is normal without agitation  No other exam findings  Contains abnormal data POCT glycosylated hemoglobin (Hb A1C) Order: [id number] Status:  Final result Visible to patient:  No (not released) Dx:  Type 2 diabetes mellitus without comp...  Ref Range & Units 13:24 (09/06/18) 54mo ago (03/12/18) 88mo ago (09/28/17) 52yr ago (03/31/17) 35yr ago (12/13/16) 70yr ago (09/23/15) 78yr ago (07/15/15)  Hemoglobin A1C 4.0 - 5.6 % 7.6Abnormal   7.3Abnormal   7.4High  R, CM  8.6High  R, CM  6.8High  R, CM  6.7High  R, CM  6.8High             Assessment & Plan:

## 2018-09-06 NOTE — Assessment & Plan Note (Signed)
Resolved, to finish antibx as rx

## 2018-09-06 NOTE — Patient Instructions (Signed)
Your A1c is OK today  Please continue all other medications as before, and refills have been done if requested.  Please have the pharmacy call with any other refills you may need.  Please continue your efforts at being more active, low cholesterol diet, and weight control.  You are otherwise up to date with prevention measures today.  Please keep your appointments with your specialists as you may have planned  Please return in 6 months, or sooner if needed, with Lab testing done 3-5 days before

## 2018-09-06 NOTE — Assessment & Plan Note (Signed)
stable overall by history and exam, recent data reviewed with pt, and pt to continue medical treatment as before,  to f/u any worsening symptoms or concerns  

## 2018-09-10 DIAGNOSIS — Z1159 Encounter for screening for other viral diseases: Secondary | ICD-10-CM | POA: Diagnosis not present

## 2018-09-10 DIAGNOSIS — M5136 Other intervertebral disc degeneration, lumbar region: Secondary | ICD-10-CM | POA: Diagnosis not present

## 2018-09-10 DIAGNOSIS — E119 Type 2 diabetes mellitus without complications: Secondary | ICD-10-CM | POA: Diagnosis not present

## 2018-09-10 DIAGNOSIS — Z79899 Other long term (current) drug therapy: Secondary | ICD-10-CM | POA: Diagnosis not present

## 2018-09-10 DIAGNOSIS — Z79891 Long term (current) use of opiate analgesic: Secondary | ICD-10-CM | POA: Diagnosis not present

## 2018-09-19 ENCOUNTER — Ambulatory Visit (INDEPENDENT_AMBULATORY_CARE_PROVIDER_SITE_OTHER): Payer: Medicare Other | Admitting: Sports Medicine

## 2018-09-19 ENCOUNTER — Encounter: Payer: Self-pay | Admitting: Sports Medicine

## 2018-09-19 ENCOUNTER — Other Ambulatory Visit: Payer: Self-pay

## 2018-09-19 VITALS — Temp 99.0°F | Resp 16

## 2018-09-19 DIAGNOSIS — E119 Type 2 diabetes mellitus without complications: Secondary | ICD-10-CM

## 2018-09-19 DIAGNOSIS — M2140 Flat foot [pes planus] (acquired), unspecified foot: Secondary | ICD-10-CM

## 2018-09-19 DIAGNOSIS — M2042 Other hammer toe(s) (acquired), left foot: Secondary | ICD-10-CM

## 2018-09-19 DIAGNOSIS — M21619 Bunion of unspecified foot: Secondary | ICD-10-CM

## 2018-09-19 DIAGNOSIS — M79676 Pain in unspecified toe(s): Secondary | ICD-10-CM

## 2018-09-19 DIAGNOSIS — B351 Tinea unguium: Secondary | ICD-10-CM

## 2018-09-19 DIAGNOSIS — M2041 Other hammer toe(s) (acquired), right foot: Secondary | ICD-10-CM

## 2018-09-19 NOTE — Progress Notes (Signed)
Subjective: David Kelly is a 73 y.o. male patient with history of diabetes who returns to office today complaining of long, painful nails while ambulating in shoes; unable to trim. Patient states that the glucose reading today was 134mg /dl. Patient denies any new changes in medication or new problems since last visit.  Saw primary care Dr. Cathlean Cower x 2 weeks ago.   Patient Active Problem List   Diagnosis Date Noted  . Abscess of finger 09/06/2018  . HLD (hyperlipidemia) 09/28/2017  . Cough 03/31/2017  . Primary osteoarthritis of right knee 03/15/2017  . Unspecified injury of lower back, sequela 03/15/2017  . Bilateral rotator cuff syndrome 03/15/2017  . Recurrent falls while walking 12/13/2016  . Preventative health care 12/13/2016  . Chronic pain 12/13/2016  . Kidney stones   . Glaucoma   . Gastrointestinal bleed 06/28/2016  . Dysuria 10/28/2015  . Itching of ear 08/13/2015  . Type 2 diabetes mellitus ([place]) 07/15/2015  . Osteoarthritis 07/15/2015   Current Outpatient Medications on File Prior to Visit  Medication Sig Dispense Refill  . aspirin EC 81 MG tablet Take 81 mg by mouth at bedtime.    Marland Kitchen atorvastatin (LIPITOR) 40 MG tablet Take 1 tablet (40 mg total) by mouth daily. 90 tablet 3  . brimonidine-timolol (COMBIGAN) 0.2-0.5 % ophthalmic solution Place 1 drop into both eyes every 12 (twelve) hours. 15 mL 1  . diclofenac sodium (VOLTAREN) 1 % GEL Apply 1 application topically 3 (three) times daily. Right knee and both shoulders. 3 Tube 4  . Fluocinolone Acetonide 0.01 % OIL Place 5 drops in ear(s) 2 (two) times daily. 20 mL 1  . glipiZIDE (GLUCOTROL XL) 5 MG 24 hr tablet Take 1 tablet (5 mg total) by mouth daily with breakfast. 90 tablet 3  . glucose blood (IGLUCOSE TEST STRIPS) test strip Use as instructed 100 each 12  . HYDROcodone-acetaminophen (NORCO) 10-325 MG tablet Take 1 tablet by mouth every 8 (eight) hours as needed. 90 tablet 0  . metFORMIN (GLUCOPHAGE) 1000  MG tablet Take 1 tablet (1,000 mg total) by mouth 2 (two) times daily with a meal. Must keep appt w/new provider for future refills 180 tablet 3  . montelukast (SINGULAIR) 10 MG tablet Take 1 tablet (10 mg total) by mouth at bedtime. 90 tablet 3  . Multiple Vitamin (MULTIVITAMIN WITH MINERALS) TABS tablet Take 1 tablet by mouth daily.    . naproxen (NAPROSYN) 500 MG tablet Take by mouth.    . Promethazine-Codeine 6.25-10 MG/5ML SOLN TAKE 1 TEASPOONFUL BY MOUTH EVERY 4 HOURS AS NEEDED FOR COUGH 180 mL 0  . tamsulosin (FLOMAX) 0.4 MG CAPS capsule Take 1 capsule (0.4 mg total) by mouth at bedtime. 90 capsule 3  . triamcinolone ointment (KENALOG) 0.5 % Apply 1 application topically 2 (two) times daily. 30 g 2   No current facility-administered medications on file prior to visit.    No Known Allergies  Recent Results (from the past 2160 hour(s))  POCT glycosylated hemoglobin (Hb A1C)     Status: Abnormal   Collection Time: 09/06/18  1:24 PM  Result Value Ref Range   Hemoglobin A1C 7.6 (A) 4.0 - 5.6 %   HbA1c POC (<> result, manual entry)     HbA1c, POC (prediabetic range)     HbA1c, POC (controlled diabetic range)      Objective: General: Patient is awake, alert, and oriented x 3 and in no acute distress. No acute changes with physical exam  Integument: Skin is  warm, dry and supple bilateral. Nails are tender, mildly elongated, thickened and dystrophic with subungual debris, consistent with onychomycosis, 1-5 bilateral. No signs of infection. No open lesions or preulcerative lesions present bilateral.  Remaining integument unremarkable.  Vasculature:  Dorsalis Pedis pulse 2/4 bilateral. Posterior Tibial pulse 1 /4 bilateral. Capillary fill time <3 sec 1-5 bilateral. Positive hair growth to the level of the digits.Temperature gradient within normal limits. No varicosities present bilateral. No edema present bilateral.   Neurology: The patient has intact sensation measured with a 5.07/10g  Semmes Weinstein Monofilament at all pedal sites bilateral . Vibratory sensation diminished bilateral with tuning fork. No Babinski sign present bilateral.   Musculoskeletal: Asymptomatic bunion and hammertoe and planus pedal deformities noted bilateral. Muscular strength 5/5 in all lower extremity muscular groups bilateral without pain on range of motion. No tenderness with calf compression bilateral.  Assessment and Plan: Problem List Items Addressed This Visit    None    Visit Diagnoses    Pain due to onychomycosis of toenail    -  Primary   Diabetes mellitus without complication (HCC)       Bunion       Hammer toes of both feet       Pes planus, unspecified laterality         -Examined patient. -Discussed and educated patient on diabetic foot care -Mechanically debrided all nails 1-5 bilateral using sterile nail nipper and filed with dremel without incident -Awaiting Diabetic shoes; currently on backorder until 8/15 -Patient to return  in 3 months for at risk foot care -Patient advised to call the office if any problems or questions arise in the meantime.  Landis Martins, DPM

## 2018-10-08 DIAGNOSIS — Z79899 Other long term (current) drug therapy: Secondary | ICD-10-CM | POA: Diagnosis not present

## 2018-10-08 DIAGNOSIS — M1711 Unilateral primary osteoarthritis, right knee: Secondary | ICD-10-CM | POA: Diagnosis not present

## 2018-10-08 DIAGNOSIS — G8929 Other chronic pain: Secondary | ICD-10-CM | POA: Diagnosis not present

## 2018-10-08 DIAGNOSIS — M545 Low back pain: Secondary | ICD-10-CM | POA: Diagnosis not present

## 2018-10-15 ENCOUNTER — Ambulatory Visit (INDEPENDENT_AMBULATORY_CARE_PROVIDER_SITE_OTHER): Payer: Medicare Other | Admitting: *Deleted

## 2018-10-15 DIAGNOSIS — Z0000 Encounter for general adult medical examination without abnormal findings: Secondary | ICD-10-CM | POA: Diagnosis not present

## 2018-10-15 NOTE — Patient Instructions (Addendum)
[facility] is available Monday through Friday, 9:00 a.m. - 5:00 p.m. Call Center Specialists provide information and referral services to aging adults 65+ in [place]. If there are waiting lists for community services, or if services are not available, [facility] connects clients with [facility] volunteers, or other caring individuals in the community who provide services such as respite care, wheelchair ramp construction, friendly visits, or transportation assistance.  [facility] Specialists consider it an honor and privilege to pray with callers. Contact the Call Center at [phone] or email [email or web address].  Continue doing brain stimulating activities (puzzles, reading, adult coloring books, staying active) to keep memory sharp.   Continue to eat heart healthy diet (full of fruits, vegetables, whole grains, lean protein, water--limit salt, fat, and sugar intake) and increase physical activity as tolerated.   Mr. David Kelly , Thank you for taking time to come for your Medicare Wellness Visit. I appreciate your ongoing commitment to your health goals. Please review the following plan we discussed and let me know if I can assist you in the future.   These are the goals we discussed: Goals    . Patient Stated     Stay as healthy and as independent as possible. Continue to exercise, eat healthy, and enjoy life and church.       This is a list of the screening recommended for you and due dates:  Health Maintenance  Topic Date Due  . Tetanus Vaccine  03/19/1964  . Pneumonia vaccines (1 of 2 - PCV13) 03/19/2010  . Flu Shot  09/15/2018  . Eye exam for diabetics  09/15/2018  . Urine Protein Check  09/29/2018  . Colon Cancer Screening  01/28/2019  . Hemoglobin A1C  03/09/2019  . Complete foot exam   09/06/2019  .  Hepatitis C: One time screening is recommended by Center for Disease Control  (CDC) for  adults born from [birth year] through 1965.   Completed    Preventive Care 24 Years and  Older, Male Preventive care refers to lifestyle choices and visits with your health care provider that can promote health and wellness. This includes:  A yearly physical exam. This is also called an annual well check.  Regular dental and eye exams.  Immunizations.  Screening for certain conditions.  Healthy lifestyle choices, such as diet and exercise. What can I expect for my preventive care visit? Physical exam Your health care provider will check:  Height and weight. These may be used to calculate body mass index (BMI), which is a measurement that tells if you are at a healthy weight.  Heart rate and blood pressure.  Your skin for abnormal spots. Counseling Your health care provider may ask you questions about:  Alcohol, tobacco, and drug use.  Emotional well-being.  Home and relationship well-being.  Sexual activity.  Eating habits.  History of falls.  Memory and ability to understand (cognition).  Work and work Statistician. What immunizations do I need?  Influenza (flu) vaccine  This is recommended every year. Tetanus, diphtheria, and pertussis (Tdap) vaccine  You may need a Td booster every 10 years. Varicella (chickenpox) vaccine  You may need this vaccine if you have not already been vaccinated. Zoster (shingles) vaccine  You may need this after age 66. Pneumococcal conjugate (PCV13) vaccine  One dose is recommended after age 34. Pneumococcal polysaccharide (PPSV23) vaccine  One dose is recommended after age 29. Measles, mumps, and rubella (MMR) vaccine  You may need at least one dose of MMR  if you were born in 1957 or later. You may also need a second dose. Meningococcal conjugate (MenACWY) vaccine  You may need this if you have certain conditions. Hepatitis A vaccine  You may need this if you have certain conditions or if you travel or work in places where you may be exposed to hepatitis A. Hepatitis B vaccine  You may need this if you  have certain conditions or if you travel or work in places where you may be exposed to hepatitis B. Haemophilus influenzae type b (Hib) vaccine  You may need this if you have certain conditions. You may receive vaccines as individual doses or as more than one vaccine together in one shot (combination vaccines). Talk with your health care provider about the risks and benefits of combination vaccines. What tests do I need? Blood tests  Lipid and cholesterol levels. These may be checked every 5 years, or more frequently depending on your overall health.  Hepatitis C test.  Hepatitis B test. Screening  Lung cancer screening. You may have this screening every year starting at age 5 if you have a 30-pack-year history of smoking and currently smoke or have quit within the past 15 years.  Colorectal cancer screening. All adults should have this screening starting at age 82 and continuing until age 66. Your health care provider may recommend screening at age 58 if you are at increased risk. You will have tests every 1-10 years, depending on your results and the type of screening test.  Prostate cancer screening. Recommendations will vary depending on your family history and other risks.  Diabetes screening. This is done by checking your blood sugar (glucose) after you have not eaten for a while (fasting). You may have this done every 1-3 years.  Abdominal aortic aneurysm (AAA) screening. You may need this if you are a current or former smoker.  Sexually transmitted disease (STD) testing. Follow these instructions at home: Eating and drinking  Eat a diet that includes fresh fruits and vegetables, whole grains, lean protein, and low-fat dairy products. Limit your intake of foods with high amounts of sugar, saturated fats, and salt.  Take vitamin and mineral supplements as recommended by your health care provider.  Do not drink alcohol if your health care provider tells you not to drink.  If  you drink alcohol: ? Limit how much you have to 0-2 drinks a day. ? Be aware of how much alcohol is in your drink. In the [place] one drink equals one 12 oz bottle of beer (355 mL), one 5 oz glass of Dakayla Disanti (148 mL), or one 1 oz glass of hard liquor (44 mL). Lifestyle  Take daily care of your teeth and gums.  Stay active. Exercise for at least 30 minutes on 5 or more days each week.  Do not use any products that contain nicotine or tobacco, such as cigarettes, e-cigarettes, and chewing tobacco. If you need help quitting, ask your health care provider.  If you are sexually active, practice safe sex. Use a condom or other form of protection to prevent STIs (sexually transmitted infections).  Talk with your health care provider about taking a low-dose aspirin or statin. What's next?  Visit your health care provider once a year for a well check visit.  Ask your health care provider how often you should have your eyes and teeth checked.  Stay up to date on all vaccines. This information is not intended to replace advice given to you by your health  care provider. Make sure you discuss any questions you have with your health care provider. Document Released: 02/27/2015 Document Revised: 01/25/2018 Document Reviewed: 01/25/2018 Elsevier Patient Education  2020 [place] and Cholesterol Restricted Eating Plan Getting too much fat and cholesterol in your diet may cause health problems. Choosing the right foods helps keep your fat and cholesterol at normal levels. This can keep you from getting certain diseases. Your doctor may recommend an eating plan that includes:  Total fat: ______% or less of total calories a day.  Saturated fat: ______% or less of total calories a day.  Cholesterol: less than _________mg a day.  Fiber: ______g a day. What are tips for following this plan? Meal planning  At meals, divide your plate into four equal parts: ? Fill one-half of your plate with  vegetables and green salads. ? Fill one-fourth of your plate with whole grains. ? Fill one-fourth of your plate with low-fat (lean) protein foods.  Eat fish that is high in omega-3 fats at least two times a week. This includes mackerel, tuna, sardines, and salmon.  Eat foods that are high in fiber, such as whole grains, beans, apples, broccoli, carrots, peas, and barley. General tips   Work with your doctor to lose weight if you need to.  Avoid: ? Foods with added sugar. ? Fried foods. ? Foods with partially hydrogenated oils.  Limit alcohol intake to no more than 1 drink a day for nonpregnant women and 2 drinks a day for men. One drink equals 12 oz of beer, 5 oz of Amin Fornwalt, or 1 oz of hard liquor. Reading food labels  Check food labels for: ? Trans fats. ? Partially hydrogenated oils. ? Saturated fat (g) in each serving. ? Cholesterol (mg) in each serving. ? Fiber (g) in each serving.  Choose foods with healthy fats, such as: ? Monounsaturated fats. ? Polyunsaturated fats. ? Omega-3 fats.  Choose grain products that have whole grains. Look for the word "whole" as the first word in the ingredient list. Cooking  Cook foods using low-fat methods. These include baking, boiling, grilling, and broiling.  Eat more home-cooked foods. Eat at restaurants and buffets less often.  Avoid cooking using saturated fats, such as butter, cream, palm oil, palm kernel oil, and coconut oil. Recommended foods  Fruits  All fresh, canned (in natural juice), or frozen fruits. Vegetables  Fresh or frozen vegetables (raw, steamed, roasted, or grilled). Green salads. Grains  Whole grains, such as whole wheat or whole grain breads, crackers, cereals, and pasta. Unsweetened oatmeal, bulgur, barley, quinoa, or brown rice. Corn or whole wheat flour tortillas. Meats and other protein foods  Ground beef (85% or leaner), grass-fed beef, or beef trimmed of fat. Skinless chicken or [place]. Ground  chicken or [place]. Pork trimmed of fat. All fish and seafood. Egg whites. Dried beans, peas, or lentils. Unsalted nuts or seeds. Unsalted canned beans. Nut butters without added sugar or oil. Dairy  Low-fat or nonfat dairy products, such as skim or 1% milk, 2% or reduced-fat cheeses, low-fat and fat-free ricotta or cottage cheese, or plain low-fat and nonfat yogurt. Fats and oils  Tub margarine without trans fats. Light or reduced-fat mayonnaise and salad dressings. Avocado. Olive, canola, sesame, or safflower oils. The items listed above may not be a complete list of foods and beverages you can eat. Contact a dietitian for more information. Foods to avoid Fruits  Canned fruit in heavy syrup. Fruit in cream or butter sauce. Fried fruit.  Vegetables  Vegetables cooked in cheese, cream, or butter sauce. Fried vegetables. Grains  White bread. White pasta. White rice. Cornbread. Bagels, pastries, and croissants. Crackers and snack foods that contain trans fat and hydrogenated oils. Meats and other protein foods  Fatty cuts of meat. Ribs, chicken wings, bacon, sausage, bologna, salami, chitterlings, fatback, hot dogs, bratwurst, and packaged lunch meats. Liver and organ meats. Whole eggs and egg yolks. Chicken and [place] with skin. Fried meat. Dairy  Whole or 2% milk, cream, half-and-half, and cream cheese. Whole milk cheeses. Whole-fat or sweetened yogurt. Full-fat cheeses. Nondairy creamers and whipped toppings. Processed cheese, cheese spreads, and cheese curds. Beverages  Alcohol. Sugar-sweetened drinks such as sodas, lemonade, and fruit drinks. Fats and oils  Butter, stick margarine, lard, shortening, ghee, or bacon fat. Coconut, palm kernel, and palm oils. Sweets and desserts  Corn syrup, sugars, honey, and molasses. Candy. Jam and jelly. Syrup. Sweetened cereals. Cookies, pies, cakes, donuts, muffins, and ice cream. The items listed above may not be a complete list of foods and  beverages you should avoid. Contact a dietitian for more information. Summary  Choosing the right foods helps keep your fat and cholesterol at normal levels. This can keep you from getting certain diseases.  At meals, fill one-half of your plate with vegetables and green salads.  Eat high-fiber foods, like whole grains, beans, apples, carrots, peas, and barley.  Limit added sugar, saturated fats, alcohol, and fried foods. This information is not intended to replace advice given to you by your health care provider. Make sure you discuss any questions you have with your health care provider. Document Released: 08/02/2011 Document Revised: 10/04/2017 Document Reviewed: 10/18/2016 Elsevier Patient Education  2020 [place] for Diabetes Mellitus, Adult  Carbohydrate counting is a method of keeping track of how many carbohydrates you eat. Eating carbohydrates naturally increases the amount of sugar (glucose) in the blood. Counting how many carbohydrates you eat helps keep your blood glucose within normal limits, which helps you manage your diabetes (diabetes mellitus). It is important to know how many carbohydrates you can safely have in each meal. This is different for every person. A diet and nutrition specialist (registered dietitian) can help you make a meal plan and calculate how many carbohydrates you should have at each meal and snack. Carbohydrates are found in the following foods:  Grains, such as breads and cereals.  Dried beans and soy products.  Starchy vegetables, such as potatoes, peas, and corn.  Fruit and fruit juices.  Milk and yogurt.  Sweets and snack foods, such as cake, cookies, candy, chips, and soft drinks. How do I count carbohydrates? There are two ways to count carbohydrates in food. You can use either of the methods or a combination of both. Reading "Nutrition Facts" on packaged food The "Nutrition Facts" list is included on the  labels of almost all packaged foods and beverages in the [place] It includes:  The serving size.  Information about nutrients in each serving, including the grams (g) of carbohydrate per serving. To use the "Nutrition Facts":  Decide how many servings you will have.  Multiply the number of servings by the number of carbohydrates per serving.  The resulting number is the total amount of carbohydrates that you will be having. Learning standard serving sizes of other foods When you eat carbohydrate foods that are not packaged or do not include "Nutrition Facts" on the label, you need to measure the servings in order to count the  amount of carbohydrates:  Measure the foods that you will eat with a food scale or measuring cup, if needed.  Decide how many standard-size servings you will eat.  Multiply the number of servings by 15. Most carbohydrate-rich foods have about 15 g of carbohydrates per serving. ? For example, if you eat 8 oz (170 g) of strawberries, you will have eaten 2 servings and 30 g of carbohydrates (2 servings x 15 g = 30 g).  For foods that have more than one food mixed, such as soups and casseroles, you must count the carbohydrates in each food that is included. The following list contains standard serving sizes of common carbohydrate-rich foods. Each of these servings has about 15 g of carbohydrates:   hamburger bun or  English muffin.   oz (15 mL) syrup.   oz (14 g) jelly.  1 slice of bread.  1 six-inch tortilla.  3 oz (85 g) cooked rice or pasta.  4 oz (113 g) cooked dried beans.  4 oz (113 g) starchy vegetable, such as peas, corn, or potatoes.  4 oz (113 g) hot cereal.  4 oz (113 g) mashed potatoes or  of a large baked potato.  4 oz (113 g) canned or frozen fruit.  4 oz (120 mL) fruit juice.  4-6 crackers.  6 chicken nuggets.  6 oz (170 g) unsweetened dry cereal.  6 oz (170 g) plain fat-free yogurt or yogurt sweetened with artificial  sweeteners.  8 oz (240 mL) milk.  8 oz (170 g) fresh fruit or one small piece of fruit.  24 oz (680 g) popped popcorn. Example of carbohydrate counting Sample meal  3 oz (85 g) chicken breast.  6 oz (170 g) brown rice.  4 oz (113 g) corn.  8 oz (240 mL) milk.  8 oz (170 g) strawberries with sugar-free whipped topping. Carbohydrate calculation 1. Identify the foods that contain carbohydrates: ? Rice. ? Corn. ? Milk. ? Strawberries. 2. Calculate how many servings you have of each food: ? 2 servings rice. ? 1 serving corn. ? 1 serving milk. ? 1 serving strawberries. 3. Multiply each number of servings by 15 g: ? 2 servings rice x 15 g = 30 g. ? 1 serving corn x 15 g = 15 g. ? 1 serving milk x 15 g = 15 g. ? 1 serving strawberries x 15 g = 15 g. 4. Add together all of the amounts to find the total grams of carbohydrates eaten: ? 30 g + 15 g + 15 g + 15 g = 75 g of carbohydrates total. Summary  Carbohydrate counting is a method of keeping track of how many carbohydrates you eat.  Eating carbohydrates naturally increases the amount of sugar (glucose) in the blood.  Counting how many carbohydrates you eat helps keep your blood glucose within normal limits, which helps you manage your diabetes.  A diet and nutrition specialist (registered dietitian) can help you make a meal plan and calculate how many carbohydrates you should have at each meal and snack. This information is not intended to replace advice given to you by your health care provider. Make sure you discuss any questions you have with your health care provider. Document Released: 01/31/2005 Document Revised: 08/25/2016 Document Reviewed: 07/15/2015 Elsevier Patient Education  [place].  Diabetes Mellitus and Nutrition, Adult When you have diabetes (diabetes mellitus), it is very important to have healthy eating habits because your blood sugar (glucose) levels are greatly affected by what  you eat and  drink. Eating healthy foods in the appropriate amounts, at about the same times every day, can help you:  Control your blood glucose.  Lower your risk of heart disease.  Improve your blood pressure.  Reach or maintain a healthy weight. Every person with diabetes is different, and each person has different needs for a meal plan. Your health care provider may recommend that you work with a diet and nutrition specialist (dietitian) to make a meal plan that is best for you. Your meal plan may vary depending on factors such as:  The calories you need.  The medicines you take.  Your weight.  Your blood glucose, blood pressure, and cholesterol levels.  Your activity level.  Other health conditions you have, such as heart or kidney disease. How do carbohydrates affect me? Carbohydrates, also called carbs, affect your blood glucose level more than any other type of food. Eating carbs naturally raises the amount of glucose in your blood. Carb counting is a method for keeping track of how many carbs you eat. Counting carbs is important to keep your blood glucose at a healthy level, especially if you use insulin or take certain oral diabetes medicines. It is important to know how many carbs you can safely have in each meal. This is different for every person. Your dietitian can help you calculate how many carbs you should have at each meal and for each snack. Foods that contain carbs include:  Bread, cereal, rice, pasta, and crackers.  Potatoes and corn.  Peas, beans, and lentils.  Milk and yogurt.  Fruit and juice.  Desserts, such as cakes, cookies, ice cream, and candy. How does alcohol affect me? Alcohol can cause a sudden decrease in blood glucose (hypoglycemia), especially if you use insulin or take certain oral diabetes medicines. Hypoglycemia can be a life-threatening condition. Symptoms of hypoglycemia (sleepiness, dizziness, and confusion) are similar to symptoms of having too  much alcohol. If your health care provider says that alcohol is safe for you, follow these guidelines:  Limit alcohol intake to no more than 1 drink per day for nonpregnant women and 2 drinks per day for men. One drink equals 12 oz of beer, 5 oz of Walida [place], or 1 oz of hard liquor.  Do not drink on an empty stomach.  Keep yourself hydrated with water, diet soda, or unsweetened iced tea.  Keep in mind that regular soda, juice, and other mixers may contain a lot of sugar and must be counted as carbs. What are tips for following this plan?  Reading food labels  Start by checking the serving size on the "Nutrition Facts" label of packaged foods and drinks. The amount of calories, carbs, fats, and other nutrients listed on the label is based on one serving of the item. Many items contain more than one serving per package.  Check the total grams (g) of carbs in one serving. You can calculate the number of servings of carbs in one serving by dividing the total carbs by 15. For example, if a food has 30 g of total carbs, it would be equal to 2 servings of carbs.  Check the number of grams (g) of saturated and trans fats in one serving. Choose foods that have low or no amount of these fats.  Check the number of milligrams (mg) of salt (sodium) in one serving. Most people should limit total sodium intake to less than 2,300 mg per day.  Always check the nutrition information of foods  labeled as "low-fat" or "nonfat". These foods may be higher in added sugar or refined carbs and should be avoided.  Talk to your dietitian to identify your daily goals for nutrients listed on the label. Shopping  Avoid buying canned, premade, or processed foods. These foods tend to be high in fat, sodium, and added sugar.  Shop around the outside edge of the grocery store. This includes fresh fruits and vegetables, bulk grains, fresh meats, and fresh dairy. Cooking  Use low-heat cooking methods, such as baking, instead  of high-heat cooking methods like deep frying.  Cook using healthy oils, such as olive, canola, or sunflower oil.  Avoid cooking with butter, cream, or high-fat meats. Meal planning  Eat meals and snacks regularly, preferably at the same times every day. Avoid going long periods of time without eating.  Eat foods high in fiber, such as fresh fruits, vegetables, beans, and whole grains. Talk to your dietitian about how many servings of carbs you can eat at each meal.  Eat 4-6 ounces (oz) of lean protein each day, such as lean meat, chicken, fish, eggs, or tofu. One oz of lean protein is equal to: ? 1 oz of meat, chicken, or fish. ? 1 egg. ?  cup of tofu.  Eat some foods each day that contain healthy fats, such as avocado, nuts, seeds, and fish. Lifestyle  Check your blood glucose regularly.  Exercise regularly as told by your health care provider. This may include: ? 150 minutes of moderate-intensity or vigorous-intensity exercise each week. This could be brisk walking, biking, or water aerobics. ? Stretching and doing strength exercises, such as yoga or weightlifting, at least 2 times a week.  Take medicines as told by your health care provider.  Do not use any products that contain nicotine or tobacco, such as cigarettes and e-cigarettes. If you need help quitting, ask your health care provider.  Work with a Social worker or diabetes educator to identify strategies to manage stress and any emotional and social challenges. Questions to ask a health care provider  Do I need to meet with a diabetes educator?  Do I need to meet with a dietitian?  What number can I call if I have questions?  When are the best times to check my blood glucose? Where to find more information:  American Diabetes Association: diabetes.org  Academy of Nutrition and Dietetics: www.eatright.[employer] of Diabetes and Digestive and Kidney Diseases (NIH): [employer] Summary  A  healthy meal plan will help you control your blood glucose and maintain a healthy lifestyle.  Working with a diet and nutrition specialist (dietitian) can help you make a meal plan that is best for you.  Keep in mind that carbohydrates (carbs) and alcohol have immediate effects on your blood glucose levels. It is important to count carbs and to use alcohol carefully. This information is not intended to replace advice given to you by your health care provider. Make sure you discuss any questions you have with your health care provider. Document Released: 10/28/2004 Document Revised: 01/13/2017 Document Reviewed: 03/07/2016 Elsevier Patient Education  2020 [place].  Insomnia Insomnia is a sleep disorder that makes it difficult to fall asleep or stay asleep. Insomnia can cause fatigue, low energy, difficulty concentrating, mood swings, and poor performance at work or school. There are three different ways to classify insomnia:  Difficulty falling asleep.  Difficulty staying asleep.  Waking up too early in the morning. Any type of insomnia can be long-term (chronic)  or short-term (acute). Both are common. Short-term insomnia usually lasts for three months or less. Chronic insomnia occurs at least three times a week for longer than three months. What are the causes? Insomnia may be caused by another condition, situation, or substance, such as:  Anxiety.  Certain medicines.  Gastroesophageal reflux disease (GERD) or other gastrointestinal conditions.  Asthma or other breathing conditions.  Restless legs syndrome, sleep apnea, or other sleep disorders.  Chronic pain.  Menopause.  Stroke.  Abuse of alcohol, tobacco, or illegal drugs.  Mental health conditions, such as depression.  Caffeine.  Neurological disorders, such as Alzheimer's disease.  An overactive thyroid (hyperthyroidism). Sometimes, the cause of insomnia may not be known. What increases the risk? Risk  factors for insomnia include:  Gender. Women are affected more often than men.  Age. Insomnia is more common as you get older.  Stress.  Lack of exercise.  Irregular work schedule or working night shifts.  Traveling between different time zones.  Certain medical and mental health conditions. What are the signs or symptoms? If you have insomnia, the main symptom is having trouble falling asleep or having trouble staying asleep. This may lead to other symptoms, such as:  Feeling fatigued or having low energy.  Feeling nervous about going to sleep.  Not feeling rested in the morning.  Having trouble concentrating.  Feeling irritable, anxious, or depressed. How is this diagnosed? This condition may be diagnosed based on:  Your symptoms and medical history. Your health care provider may ask about: ? Your sleep habits. ? Any medical conditions you have. ? Your mental health.  A physical exam. How is this treated? Treatment for insomnia depends on the cause. Treatment may focus on treating an underlying condition that is causing insomnia. Treatment may also include:  Medicines to help you sleep.  Counseling or therapy.  Lifestyle adjustments to help you sleep better. Follow these instructions at home: Eating and drinking   Limit or avoid alcohol, caffeinated beverages, and cigarettes, especially close to bedtime. These can disrupt your sleep.  Do not eat a large meal or eat spicy foods right before bedtime. This can lead to digestive discomfort that can make it hard for you to sleep. Sleep habits   Keep a sleep diary to help you and your health care provider figure out what could be causing your insomnia. Write down: ? When you sleep. ? When you wake up during the night. ? How well you sleep. ? How rested you feel the next day. ? Any side effects of medicines you are taking. ? What you eat and drink.  Make your bedroom a dark, comfortable place where it is easy  to fall asleep. ? Put up shades or blackout curtains to block light from outside. ? Use a white noise machine to block noise. ? Keep the temperature cool.  Limit screen use before bedtime. This includes: ? Watching TV. ? Using your smartphone, tablet, or computer.  Stick to a routine that includes going to bed and waking up at the same times every day and night. This can help you fall asleep faster. Consider making a quiet activity, such as reading, part of your nighttime routine.  Try to avoid taking naps during the day so that you sleep better at night.  Get out of bed if you are still awake after 15 minutes of trying to sleep. Keep the lights down, but try reading or doing a quiet activity. When you feel sleepy, go back to  bed. General instructions  Take over-the-counter and prescription medicines only as told by your health care provider.  Exercise regularly, as told by your health care provider. Avoid exercise starting several hours before bedtime.  Use relaxation techniques to manage stress. Ask your health care provider to suggest some techniques that may work well for you. These may include: ? Breathing exercises. ? Routines to release muscle tension. ? Visualizing peaceful scenes.  Make sure that you drive carefully. Avoid driving if you feel very sleepy.  Keep all follow-up visits as told by your health care provider. This is important. Contact a health care provider if:  You are tired throughout the day.  You have trouble in your daily routine due to sleepiness.  You continue to have sleep problems, or your sleep problems get worse. Get help right away if:  You have serious thoughts about hurting yourself or someone else. If you ever feel like you may hurt yourself or others, or have thoughts about taking your own life, get help right away. You can go to your nearest emergency department or call:  Your local emergency services (911 in the [place]  A suicide crisis  helpline, such as the [facility] at (902)554-0689. This is open 24 hours a day. Summary  Insomnia is a sleep disorder that makes it difficult to fall asleep or stay asleep.  Insomnia can be long-term (chronic) or short-term (acute).  Treatment for insomnia depends on the cause. Treatment may focus on treating an underlying condition that is causing insomnia.  Keep a sleep diary to help you and your health care provider figure out what could be causing your insomnia. This information is not intended to replace advice given to you by your health care provider. Make sure you discuss any questions you have with your health care provider. Document Released: 01/29/2000 Document Revised: 01/13/2017 Document Reviewed: 11/10/2016 Elsevier Patient Education  2020 [employer].

## 2018-10-15 NOTE — Progress Notes (Addendum)
Subjective:   David Kelly is a 73 y.o. male who presents for Medicare Annual/Subsequent preventive examination. I connected with patient by a telephone and verified that I am speaking with the correct person using two identifiers. Patient stated full name and DOB. Patient gave permission to continue with telephonic visit. Patient's location was at home and Nurse's location was at [facility].   Review of Systems:   Cardiac Risk Factors include: advanced age (>12men, >76 women);male gender;diabetes mellitus;dyslipidemia;hypertension Home Safety/Smoke Alarms: Feels safe in home. Smoke alarms in place.  Living environment; residence and Firearm Safety: 1-story house/ trailer. Lives alone, no needs for DME, good support system Seat Belt Safety/Bike Helmet: Wears seat belt.     Objective:    Vitals: There were no vitals taken for this visit.  There is no height or weight on file to calculate BMI.  Advanced Directives 10/15/2018 09/28/2017 05/20/2016 05/17/2016 04/04/2011  Does Patient Have a Medical Advance Directive? No No No No Patient does not have advance directive  Would patient like information on creating a medical advance directive? Yes (ED - Information included in AVS) Yes (ED - Information included in AVS) No - Patient declined - -    Tobacco Social History   Tobacco Use  Smoking Status Former Smoker  . Packs/day: 1.50  . Years: 45.00  . Pack years: 67.50  . Types: Cigarettes  . Quit date: 03/21/2006  . Years since quitting: 12.5  Smokeless Tobacco Former Systems developer  . Types: Chew     Counseling given: Not Answered  Past Medical History:  Diagnosis Date  . Arthritis   . BPH (benign prostatic hypertrophy)   . Diabetes mellitus    DX  5 YR AGO  . Glaucoma   . HLD (hyperlipidemia) 09/28/2017  . Kidney stones    Past Surgical History:  Procedure Laterality Date  . CYSTOSCOPY WITH RETROGRADE PYELOGRAM, URETEROSCOPY AND STENT PLACEMENT Right 05/20/2016   Procedure:  CYSTOSCOPY, URETEROSCOPY, TRANSURETHRAL INCISION OF URETHROCELE, BASKET STONE REMOVAL;  Surgeon: Irine Seal, MD;  Location: [facility];  Service: Urology;  Laterality: Right;  . left total knee replacement    . TOTAL KNEE ARTHROPLASTY  04/04/2011   Procedure: TOTAL KNEE ARTHROPLASTY;  Surgeon: Rudean Haskell, MD;  Location: [facility];  Service: Orthopedics;  Laterality: Left;   Family History  Problem Relation Age of Onset  . Arthritis Mother   . Asthma Paternal Grandfather   . Diabetes Sister    Social History   Socioeconomic History  . Marital status: Single    Spouse name: Not on file  . Number of children: 4  . Years of education: 34  . Highest education level: Not on file  Occupational History  . Occupation: retired  Scientific laboratory technician  . Financial resource strain: Not very hard  . Food insecurity    Worry: Never true    Inability: Never true  . Transportation needs    Medical: No    Non-medical: No  Tobacco Use  . Smoking status: Former Smoker    Packs/day: 1.50    Years: 45.00    Pack years: 67.50    Types: Cigarettes    Quit date: 03/21/2006    Years since quitting: 12.5  . Smokeless tobacco: Former Systems developer    Types: Chew  Substance and Sexual Activity  . Alcohol use: No  . Drug use: No  . Sexual activity: Not Currently  Lifestyle  . Physical activity    Days per week: 4 days  Minutes per session: 50 min  . Stress: Only a little  Relationships  . Social connections    Talks on phone: More than three times a week    Gets together: More than three times a week    Attends religious service: More than 4 times per year    Active member of club or organization: Yes    Attends meetings of clubs or organizations: More than 4 times per year    Relationship status: Not on file  Other Topics Concern  . Not on file  Social History Narrative   Fun: Fish and go to the park    Outpatient Encounter Medications as of 10/15/2018  Medication Sig  . aspirin EC 81 MG tablet Take 81  mg by mouth at bedtime.  Marland Kitchen atorvastatin (LIPITOR) 40 MG tablet Take 1 tablet (40 mg total) by mouth daily.  . brimonidine-timolol (COMBIGAN) 0.2-0.5 % ophthalmic solution Place 1 drop into both eyes every 12 (twelve) hours.  . diclofenac sodium (VOLTAREN) 1 % GEL Apply 1 application topically 3 (three) times daily. Right knee and both shoulders.  . Fluocinolone Acetonide 0.01 % OIL Place 5 drops in ear(s) 2 (two) times daily.  Marland Kitchen glipiZIDE (GLUCOTROL XL) 5 MG 24 hr tablet Take 1 tablet (5 mg total) by mouth daily with breakfast.  . glucose blood (IGLUCOSE TEST STRIPS) test strip Use as instructed  . HYDROcodone-acetaminophen (NORCO) 10-325 MG tablet Take 1 tablet by mouth every 8 (eight) hours as needed.  . metFORMIN (GLUCOPHAGE) 1000 MG tablet Take 1 tablet (1,000 mg total) by mouth 2 (two) times daily with a meal. Must keep appt w/new provider for future refills  . montelukast (SINGULAIR) 10 MG tablet Take 1 tablet (10 mg total) by mouth at bedtime.  . Multiple Vitamin (MULTIVITAMIN WITH MINERALS) TABS tablet Take 1 tablet by mouth daily.  . naproxen (NAPROSYN) 500 MG tablet Take by mouth.  . Promethazine-Codeine 6.25-10 MG/5ML SOLN TAKE 1 TEASPOONFUL BY MOUTH EVERY 4 HOURS AS NEEDED FOR COUGH  . psyllium (METAMUCIL) 58.6 % packet Take 1 packet by mouth daily.  . tamsulosin (FLOMAX) 0.4 MG CAPS capsule Take 1 capsule (0.4 mg total) by mouth at bedtime.  . triamcinolone ointment (KENALOG) 0.5 % Apply 1 application topically 2 (two) times daily.   No facility-administered encounter medications on file as of 10/15/2018.     Activities of Daily Living In your present state of health, do you have any difficulty performing the following activities: 10/15/2018  Hearing? N  Vision? N  Difficulty concentrating or making decisions? N  Walking or climbing stairs? N  Dressing or bathing? N  Doing errands, shopping? N  Preparing Food and eating ? N  Using the Toilet? N  In the past six months, have  you accidently leaked urine? N  Do you have problems with loss of bowel control? N  Managing your Medications? N  Managing your Finances? N  Housekeeping or managing your Housekeeping? N  Some recent data might be hidden    Patient Care Team: Biagio Borg, MD as PCP - General (Internal Medicine) Landis Martins, DPM as Consulting Physician (Podiatry) Irine Seal, MD as Attending Physician (Urology)   Assessment:   This is a routine wellness examination for [employer]. Physical assessment deferred to PCP.   Exercise Activities and Dietary recommendations Current Exercise Habits: Home exercise routine, Type of exercise: walking, Time (Minutes): 30, Frequency (Times/Week): 6, Weekly Exercise (Minutes/Week): 180, Intensity: Mild, Exercise limited by: orthopedic condition(s) Diet (meal  preparation, eat out, water intake, caffeinated beverages, dairy products, fruits and vegetables): in general, a "healthy" diet     Reviewed heart healthy and diabetic diet. Encouraged patient to increase daily water and healthy fluid intake. Relevant patient education assigned to patient using [facility]i.  Goals    . Patient Stated     Stay as healthy and as independent as possible. Continue to exercise, eat healthy, and enjoy life and church.       Fall Risk Fall Risk  10/15/2018 09/06/2018 09/28/2017 03/31/2017 03/15/2017  Falls in the past year? 1 0 Yes No Yes  Comment - - - - last fall December 2018  Number falls in past yr: 0 - 1 - 2 or more  Injury with Fall? 0 - No - Yes  Risk for fall due to : Impaired balance/gait - - - -  Follow up Falls prevention discussed - - - -   Is the patient's home free of loose throw rugs in walkways, pet beds, electrical cords, etc?   yes      Grab bars in the bathroom? no      Handrails on the stairs?   yes      Adequate lighting?   yes   Depression Screen PHQ 2/9 Scores 10/15/2018 09/06/2018 09/28/2017 03/31/2017  PHQ - 2 Score 1 0 0 0  PHQ- 9 Score - - - -     Cognitive Function MMSE - Mini Mental State Exam 09/28/2017  Orientation to time 5  Orientation to Place 5  Registration 3  Attention/ Calculation 3  Recall 1  Language- name 2 objects 2  Language- repeat 1  Language- follow 3 step command 3  Language- read & follow direction 1  Write a sentence 1  Copy design 1  Total score 26     6CIT Screen 10/15/2018  What Year? 0 points  What month? 0 points  What time? 0 points  Count back from 20 0 points  Months in reverse 0 points  Repeat phrase 0 points  Total Score 0    Immunization History  Administered Date(s) Administered  . Influenza, High Dose Seasonal PF 10/28/2015   Screening Tests Health Maintenance  Topic Date Due  . TETANUS/TDAP  03/19/1964  . PNA vac Low Risk Adult (1 of 2 - PCV13) 03/19/2010  . INFLUENZA VACCINE  09/15/2018  . OPHTHALMOLOGY EXAM  09/15/2018  . URINE MICROALBUMIN  09/29/2018  . COLONOSCOPY  01/28/2019  . HEMOGLOBIN A1C  03/09/2019  . FOOT EXAM  09/06/2019  . Hepatitis C Screening  Completed      Plan:   Reviewed health maintenance screenings with patient today and relevant education, vaccines, and/or referrals were provided.   I have personally reviewed and noted the following in the patient's chart:   . Medical and social history . Use of alcohol, tobacco or illicit drugs  . Current medications and supplements . Functional ability and status . Nutritional status . Physical activity . Advanced directives . List of other physicians . Screenings to include cognitive, depression, and falls . Referrals and appointments  In addition, I have reviewed and discussed with patient certain preventive protocols, quality metrics, and best practice recommendations. A written personalized care plan for preventive services as well as general preventive health recommendations were provided to patient.     Michiel Cowboy, RN  10/15/2018  Medical screening examination/treatment/procedure(s) were  performed by non-physician practitioner and as supervising physician I was immediately available for consultation/collaboration. I agree with  above. Cathlean Cower, MD

## 2018-11-06 DIAGNOSIS — Z79891 Long term (current) use of opiate analgesic: Secondary | ICD-10-CM | POA: Diagnosis not present

## 2018-11-06 DIAGNOSIS — G8929 Other chronic pain: Secondary | ICD-10-CM | POA: Diagnosis not present

## 2018-11-06 DIAGNOSIS — M545 Low back pain: Secondary | ICD-10-CM | POA: Diagnosis not present

## 2018-11-06 DIAGNOSIS — Z79899 Other long term (current) drug therapy: Secondary | ICD-10-CM | POA: Diagnosis not present

## 2018-11-06 DIAGNOSIS — Z1159 Encounter for screening for other viral diseases: Secondary | ICD-10-CM | POA: Diagnosis not present

## 2018-11-09 ENCOUNTER — Other Ambulatory Visit: Payer: Self-pay | Admitting: Internal Medicine

## 2018-11-09 DIAGNOSIS — E119 Type 2 diabetes mellitus without complications: Secondary | ICD-10-CM

## 2018-11-09 MED ORDER — METFORMIN HCL 1000 MG PO TABS: 1000.0000 mg | ORAL_TABLET | Freq: Two times a day (BID) | ORAL | 3 refills | Status: DC

## 2018-11-09 NOTE — Telephone Encounter (Signed)
Medication Refill - Medication: metFORMIN (GLUCOPHAGE) 1000 MG tablet  Has the patient contacted their pharmacy? yes (Agent: If no, request that the patient contact the pharmacy for the refill.) (Agent: If yes, when and what did the pharmacy advise?)  Preferred Pharmacy (with phone number or street name):  [place], [place] [phone] (Phone) [phone] (Fax)   Agent: Please be advised that RX refills may take up to 3 business days. We ask that you follow-up with your pharmacy.

## 2018-11-09 NOTE — Telephone Encounter (Signed)
Requested medication (s) are due for refill today: yes  Requested medication (s) are on the active medication list: yes  Last refill:  09/28/2017  Future visit scheduled: yes  Notes to clinic: review for refill   Requested Prescriptions  Pending Prescriptions Disp Refills   metFORMIN (GLUCOPHAGE) 1000 MG tablet 180 tablet 3    Sig: Take 1 tablet (1,000 mg total) by mouth 2 (two) times daily with a meal. Must keep appt w/new provider for future refills     Endocrinology:  Diabetes - Biguanides Failed - 11/09/2018 11:34 AM      Failed - Cr in normal range and within 360 days    Creatinine, Ser  Date Value Ref Range Status  09/28/2017 0.77 0.40 - 1.50 mg/dL Final         Failed - eGFR in normal range and within 360 days    GFR calc Af Amer  Date Value Ref Range Status  05/17/2016 >60 >60 mL/min Final    Comment:    (NOTE) The eGFR has been calculated using the CKD EPI equation. This calculation has not been validated in all clinical situations. eGFR's persistently <60 mL/min signify possible Chronic Kidney Disease.    GFR calc non Af Amer  Date Value Ref Range Status  05/17/2016 >60 >60 mL/min Final   GFR  Date Value Ref Range Status  09/28/2017 127.53 >60.00 mL/min Final         Passed - HBA1C is between 0 and 7.9 and within 180 days    Hemoglobin A1C  Date Value Ref Range Status  09/06/2018 7.6 (A) 4.0 - 5.6 % Final   Hgb A1c MFr Bld  Date Value Ref Range Status  09/28/2017 7.4 (H) 4.6 - 6.5 % Final    Comment:    Glycemic Control Guidelines for People with Diabetes:Non Diabetic:  <6%Goal of Therapy: <7%Additional Action Suggested:  >8%          Passed - Valid encounter within last 6 months    Recent Outpatient Visits          2 months ago Type 2 diabetes mellitus without complication, without long-term current use of insulin ([place])   [facility] -Georges Mouse, MD   8 months ago Preventative health care   [facility] -Georges Mouse, MD   1 year ago Type 2 diabetes mellitus without complication, without long-term current use of insulin [facility])   [facility] -Georges Mouse, MD   1 year ago Cough   [facility] -Georges Mouse, MD   1 year ago Preventative health care   [facility] -Georges Mouse, MD      Future Appointments            In 4 months [name], Hunt Oris, MD [place], [place]   In 11 months  [place], [facility]

## 2018-12-05 DIAGNOSIS — Z1159 Encounter for screening for other viral diseases: Secondary | ICD-10-CM | POA: Diagnosis not present

## 2018-12-05 DIAGNOSIS — G8929 Other chronic pain: Secondary | ICD-10-CM | POA: Diagnosis not present

## 2018-12-05 DIAGNOSIS — M1711 Unilateral primary osteoarthritis, right knee: Secondary | ICD-10-CM | POA: Diagnosis not present

## 2018-12-05 DIAGNOSIS — E119 Type 2 diabetes mellitus without complications: Secondary | ICD-10-CM | POA: Diagnosis not present

## 2018-12-05 DIAGNOSIS — M545 Low back pain: Secondary | ICD-10-CM | POA: Diagnosis not present

## 2018-12-05 DIAGNOSIS — Z79899 Other long term (current) drug therapy: Secondary | ICD-10-CM | POA: Diagnosis not present

## 2018-12-13 DIAGNOSIS — E119 Type 2 diabetes mellitus without complications: Secondary | ICD-10-CM | POA: Diagnosis not present

## 2018-12-13 DIAGNOSIS — H25813 Combined forms of age-related cataract, bilateral: Secondary | ICD-10-CM | POA: Diagnosis not present

## 2018-12-13 LAB — HM DIABETES EYE EXAM

## 2018-12-19 ENCOUNTER — Encounter: Payer: Self-pay | Admitting: Sports Medicine

## 2018-12-19 ENCOUNTER — Ambulatory Visit (INDEPENDENT_AMBULATORY_CARE_PROVIDER_SITE_OTHER): Payer: Medicare Other | Admitting: Sports Medicine

## 2018-12-19 ENCOUNTER — Other Ambulatory Visit: Payer: Self-pay

## 2018-12-19 DIAGNOSIS — M79676 Pain in unspecified toe(s): Secondary | ICD-10-CM | POA: Diagnosis not present

## 2018-12-19 DIAGNOSIS — M21619 Bunion of unspecified foot: Secondary | ICD-10-CM

## 2018-12-19 DIAGNOSIS — M2140 Flat foot [pes planus] (acquired), unspecified foot: Secondary | ICD-10-CM

## 2018-12-19 DIAGNOSIS — M2041 Other hammer toe(s) (acquired), right foot: Secondary | ICD-10-CM | POA: Diagnosis not present

## 2018-12-19 DIAGNOSIS — E119 Type 2 diabetes mellitus without complications: Secondary | ICD-10-CM

## 2018-12-19 DIAGNOSIS — M2042 Other hammer toe(s) (acquired), left foot: Secondary | ICD-10-CM

## 2018-12-19 DIAGNOSIS — B351 Tinea unguium: Secondary | ICD-10-CM | POA: Diagnosis not present

## 2018-12-19 NOTE — Progress Notes (Signed)
Subjective: David Kelly is a 73 y.o. male patient with history of diabetes who returns to office today for pick up of diabetic shoes and also complaining of long, painful nails while ambulating in shoes; unable to trim. Patient states that the glucose reading today was 117mg /dl. Last A1c unknown. Patient denies any new changes in medication or new problems since last visit.  Saw primary care Dr. Cathlean Cower x 3 months ago during the summer.   Patient Active Problem List   Diagnosis Date Noted  . Abscess of finger 09/06/2018  . HLD (hyperlipidemia) 09/28/2017  . Cough 03/31/2017  . Primary osteoarthritis of right knee 03/15/2017  . Unspecified injury of lower back, sequela 03/15/2017  . Bilateral rotator cuff syndrome 03/15/2017  . Recurrent falls while walking 12/13/2016  . Preventative health care 12/13/2016  . Chronic pain 12/13/2016  . Kidney stones   . Glaucoma   . Gastrointestinal bleed 06/28/2016  . Dysuria 10/28/2015  . Itching of ear 08/13/2015  . Type 2 diabetes mellitus ([place]) 07/15/2015  . Osteoarthritis 07/15/2015   Current Outpatient Medications on File Prior to Visit  Medication Sig Dispense Refill  . aspirin EC 81 MG tablet Take 81 mg by mouth at bedtime.    Marland Kitchen atorvastatin (LIPITOR) 40 MG tablet Take 1 tablet (40 mg total) by mouth daily. 90 tablet 3  . brimonidine-timolol (COMBIGAN) 0.2-0.5 % ophthalmic solution Place 1 drop into both eyes every 12 (twelve) hours. 15 mL 1  . diclofenac sodium (VOLTAREN) 1 % GEL Apply 1 application topically 3 (three) times daily. Right knee and both shoulders. 3 Tube 4  . Fluocinolone Acetonide 0.01 % OIL Place 5 drops in ear(s) 2 (two) times daily. 20 mL 1  . glipiZIDE (GLUCOTROL XL) 5 MG 24 hr tablet Take 1 tablet (5 mg total) by mouth daily with breakfast. 90 tablet 3  . glucose blood (IGLUCOSE TEST STRIPS) test strip Use as instructed 100 each 12  . HYDROcodone-acetaminophen (NORCO) 10-325 MG tablet Take 1 tablet by mouth  every 8 (eight) hours as needed. 90 tablet 0  . metFORMIN (GLUCOPHAGE) 1000 MG tablet Take 1 tablet (1,000 mg total) by mouth 2 (two) times daily with a meal. 180 tablet 3  . montelukast (SINGULAIR) 10 MG tablet Take 1 tablet (10 mg total) by mouth at bedtime. 90 tablet 3  . Multiple Vitamin (MULTIVITAMIN WITH MINERALS) TABS tablet Take 1 tablet by mouth daily.    . naproxen (NAPROSYN) 500 MG tablet Take by mouth.    . Promethazine-Codeine 6.25-10 MG/5ML SOLN TAKE 1 TEASPOONFUL BY MOUTH EVERY 4 HOURS AS NEEDED FOR COUGH 180 mL 0  . psyllium (METAMUCIL) 58.6 % packet Take 1 packet by mouth daily.    . tamsulosin (FLOMAX) 0.4 MG CAPS capsule Take 1 capsule (0.4 mg total) by mouth at bedtime. 90 capsule 3  . triamcinolone ointment (KENALOG) 0.5 % Apply 1 application topically 2 (two) times daily. 30 g 2   No current facility-administered medications on file prior to visit.    No Known Allergies  No results found for this or any previous visit (from the past 2160 hour(s)).  Objective: General: Patient is awake, alert, and oriented x 3 and in no acute distress. No acute changes with physical exam  Integument: Skin is warm, dry and supple bilateral. Nails are tender, mildly elongated, thickened and dystrophic with subungual debris, consistent with onychomycosis, 1-5 bilateral. No signs of infection. No open lesions or preulcerative lesions present bilateral.  Remaining integument  unremarkable.  Vasculature:  Dorsalis Pedis pulse 2/4 bilateral. Posterior Tibial pulse 1 /4 bilateral. Capillary fill time <3 sec 1-5 bilateral. Positive hair growth to the level of the digits.Temperature gradient within normal limits. No varicosities present bilateral. No edema present bilateral.   Neurology: The patient has intact sensation measured with a 5.07/10g Semmes Weinstein Monofilament at all pedal sites bilateral . Vibratory sensation diminished bilateral with tuning fork. No Babinski sign present bilateral.    Musculoskeletal: Asymptomatic bunion and hammertoe and planus pedal deformities noted bilateral. Muscular strength 5/5 in all lower extremity muscular groups bilateral without pain on range of motion. No tenderness with calf compression bilateral.  Assessment and Plan: Problem List Items Addressed This Visit    None    Visit Diagnoses    Pain due to onychomycosis of toenail    -  Primary   Diabetes mellitus without complication (HCC)       Bunion       Hammer toes of both feet       Pes planus, unspecified laterality         -Examined patient. -Discussed and educated patient on diabetic foot care -Mechanically debrided all nails 1-5 bilateral using sterile nail nipper and filed with dremel without incident -Diabetic shoes dispensed to patient; wear and break in period explained, patient able to ambulate 10 feet without any pain or discomfort; reports that they are a little too big but wants to keep them; I advised patient to try shoes with both insoles to take up more space in shoes since too big  -Patient to return  in 3 months for at risk foot care or sooner if there are any problems or issues -Patient advised to call the office if any problems or questions arise in the meantime.  Landis Martins, DPM

## 2018-12-26 ENCOUNTER — Ambulatory Visit: Payer: Medicare Other | Admitting: Orthotics

## 2019-01-01 DIAGNOSIS — Z79891 Long term (current) use of opiate analgesic: Secondary | ICD-10-CM | POA: Diagnosis not present

## 2019-01-01 DIAGNOSIS — Z79899 Other long term (current) drug therapy: Secondary | ICD-10-CM | POA: Diagnosis not present

## 2019-01-01 DIAGNOSIS — G8929 Other chronic pain: Secondary | ICD-10-CM | POA: Diagnosis not present

## 2019-01-01 DIAGNOSIS — M545 Low back pain: Secondary | ICD-10-CM | POA: Diagnosis not present

## 2019-01-16 ENCOUNTER — Encounter: Payer: Self-pay | Admitting: Gastroenterology

## 2019-01-21 ENCOUNTER — Encounter: Payer: Self-pay | Admitting: Gastroenterology

## 2019-01-21 IMAGING — CT CT RENAL STONE PROTOCOL
2 of 4 series · 10 of 46 positions shown, 11 images · non-contrast
Comparison: 09/30/2014

CLINICAL DATA: Right lower quadrant abdominal pain.

EXAM:
CT ABDOMEN AND PELVIS WITHOUT CONTRAST
TECHNIQUE: Multidetector CT imaging of the abdomen and pelvis was performed
following the standard protocol without IV contrast.

[Series 201: stone study, idose (2) · axial · 0.84mm/px · z∈[-440,-55]mm · 7 of 95 slices shown, 8 images]
[im 9/95  soft-tissue]
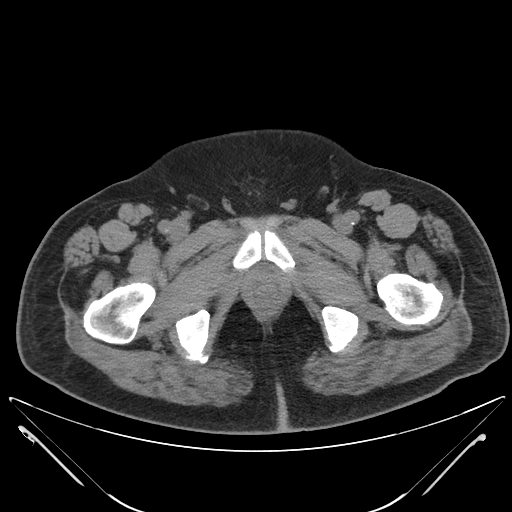
[im 9/95  bone]
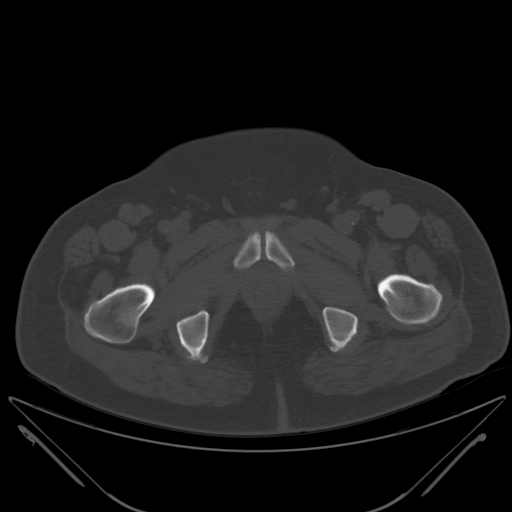
[im 21/95  soft-tissue]
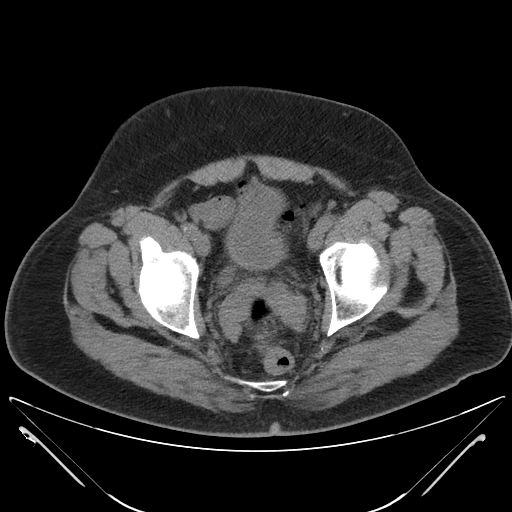
[im 33/95  soft-tissue]
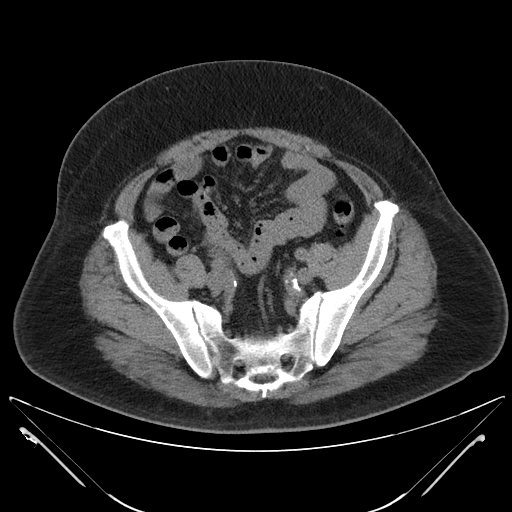
[im 50/95  soft-tissue]
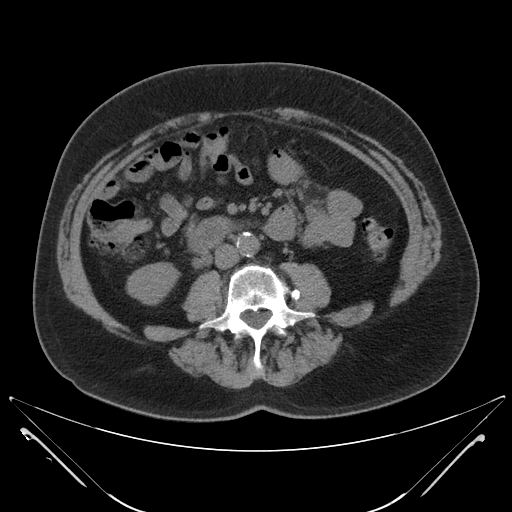
[im 62/95  soft-tissue]
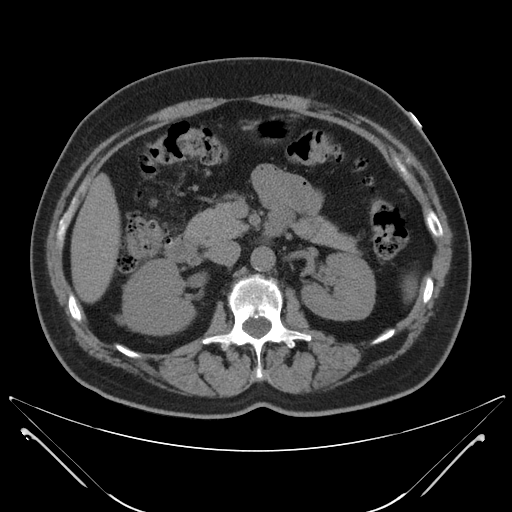
[im 74/95  soft-tissue]
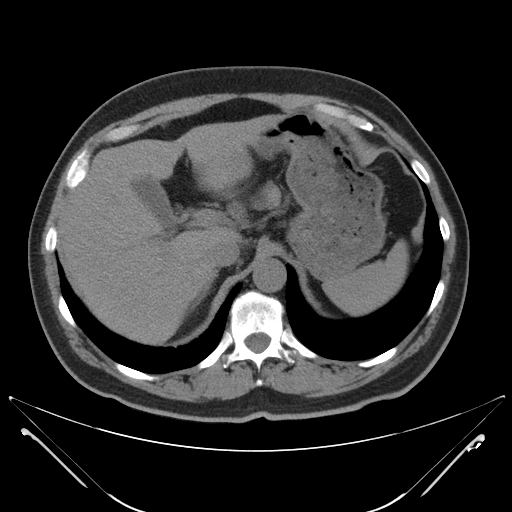
[im 86/95  soft-tissue]
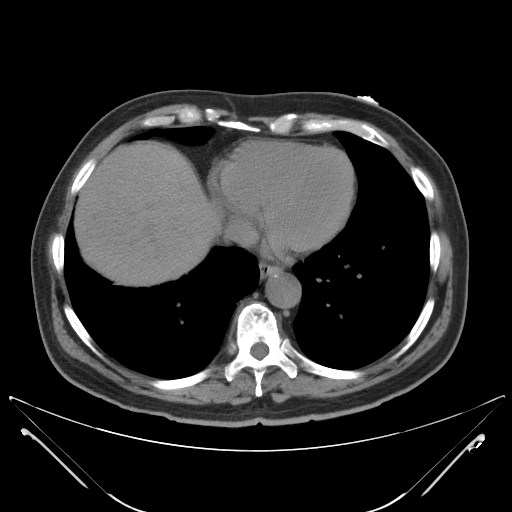

[Series 203: coronals, idose (2) · coronal · 0.45mm/px · 3 of 139 slices shown]
[im 47/139  soft-tissue]
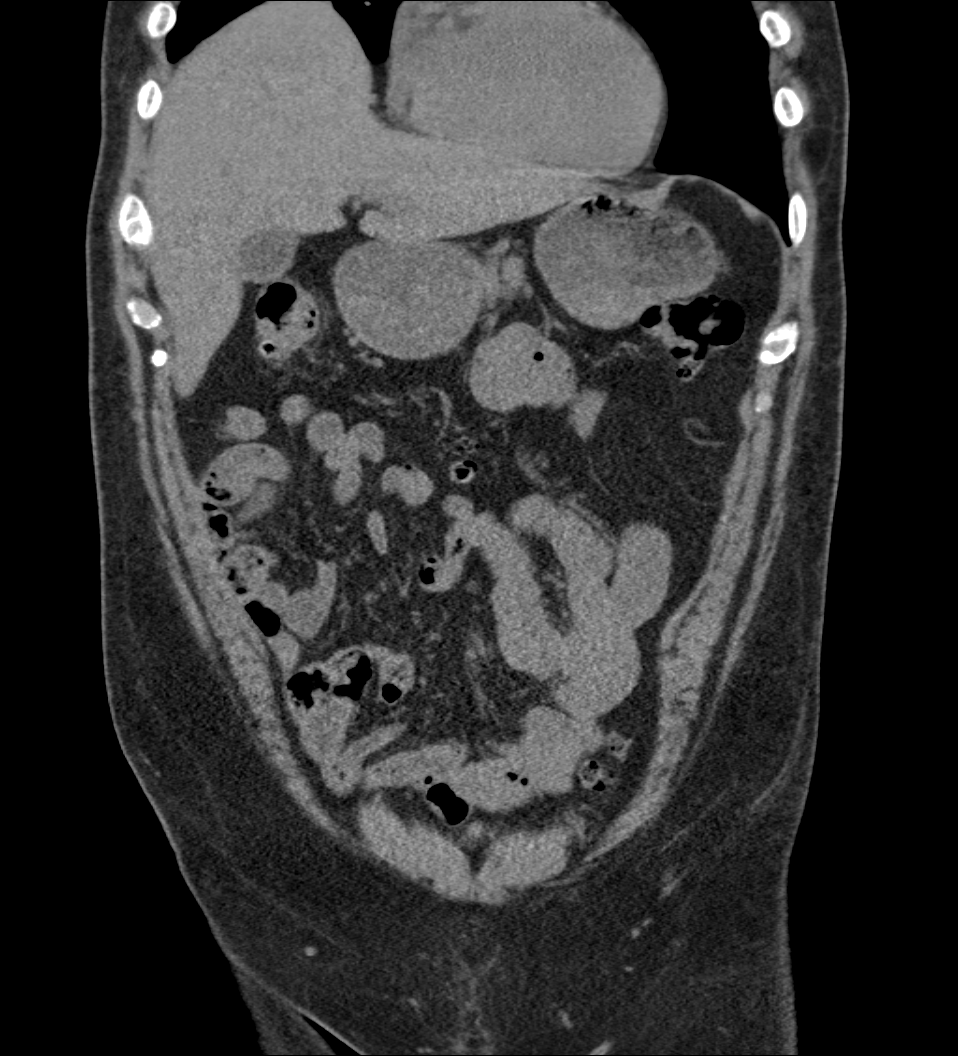
[im 62/139  soft-tissue]
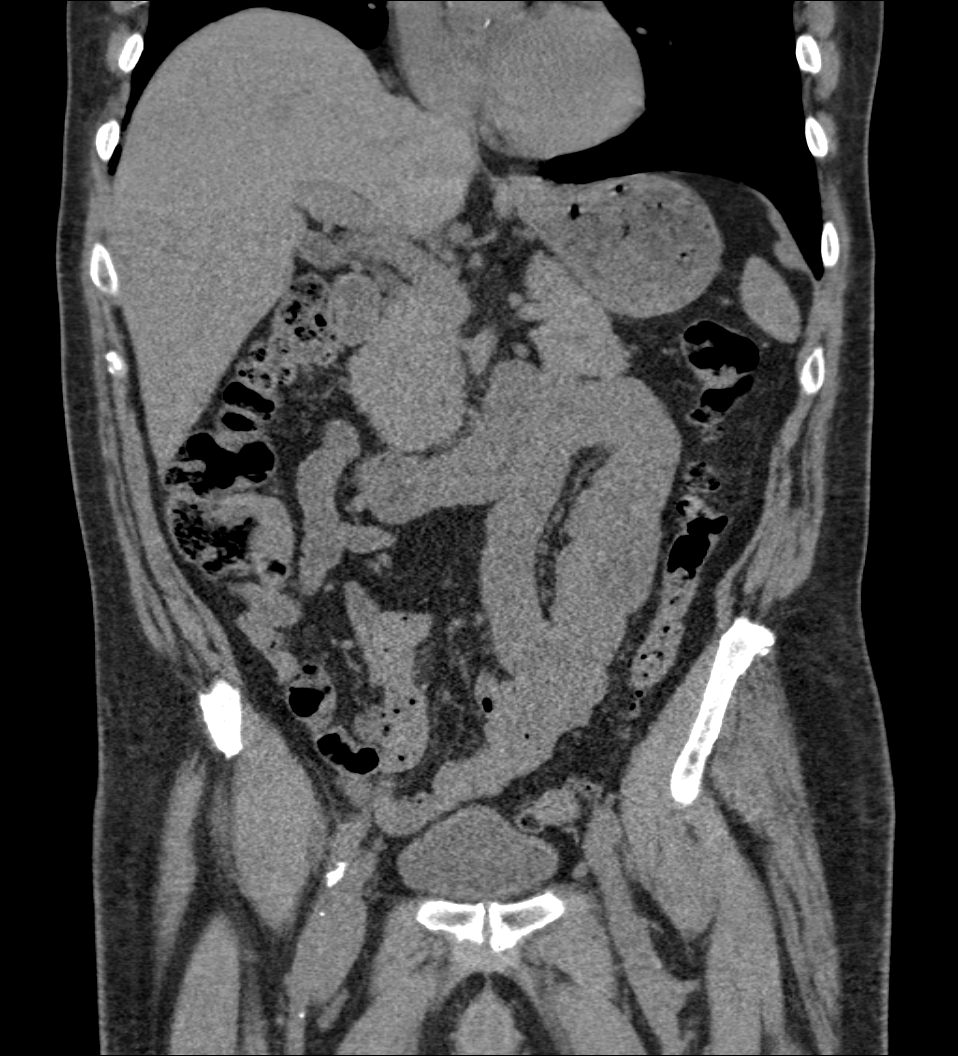
[im 77/139  soft-tissue]
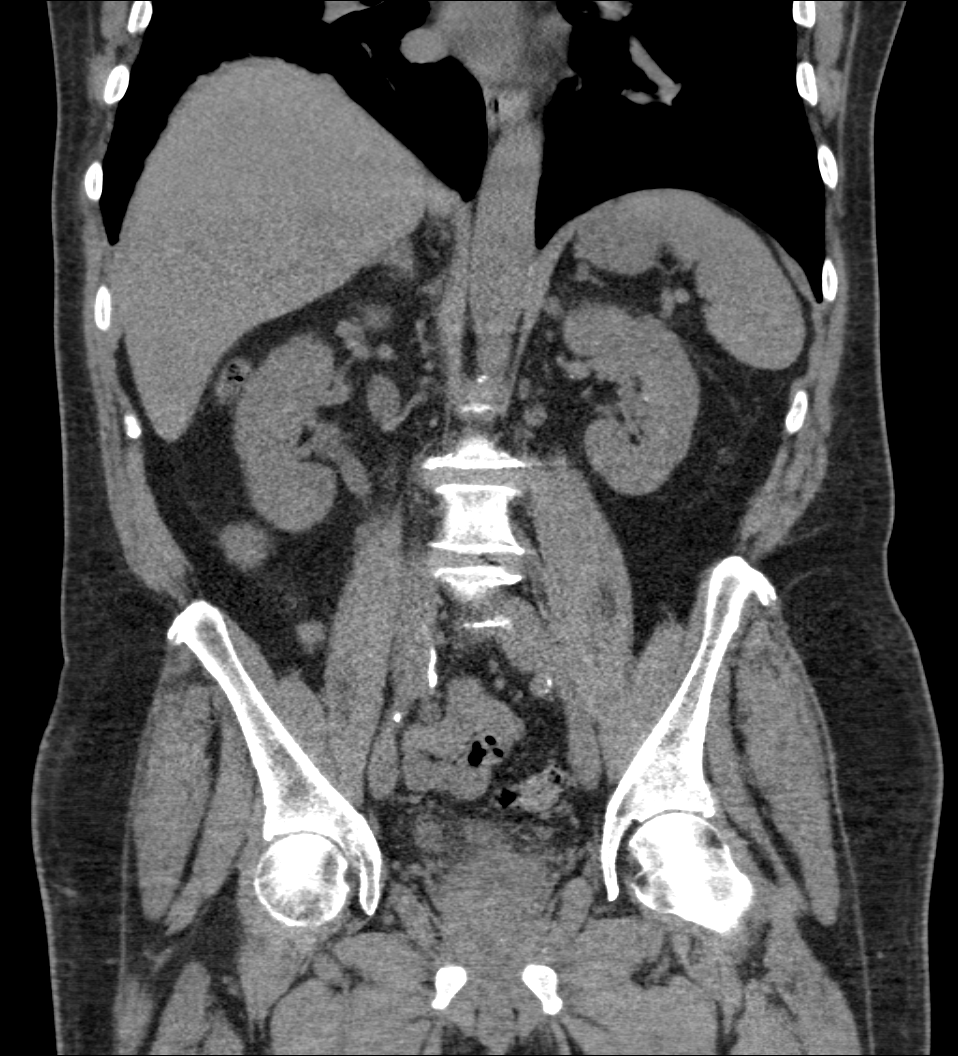

[10 of 46 positions shown; findings below may reference images not displayed]

FINDINGS: Lower chest: No acute abnormality.

Hepatobiliary: Cholelithiasis. Normal bile ducts. Unremarkable
unenhanced appearances of the liver except for a stable
subcentimeter hypodensity in the left lobe dome which is unchanged
from at least 11/25/2013.

Pancreas: Unremarkable. No pancreatic ductal dilatation or
surrounding inflammatory changes.

Spleen: Normal in size without focal abnormality.

Adrenals/Urinary Tract: Both adrenals are normal.

There is marked hydronephrosis and hydroureter on the right. There
is a 7 x 10 mm calculus which appears to be within the urinary
bladder lumen and this may represent recent stone passage. Left
kidney has a 4 x 7 mm calculus in the midpole collecting system. No
left hydronephrosis or ureteral dilatation.

Stomach/Bowel: Stomach is within normal limits. Appendix is normal.
Extensive colonic diverticulosis. No evidence of bowel wall
thickening, distention, or inflammatory changes.

Vascular/Lymphatic: The abdominal aorta is normal in caliber with
moderate atherosclerotic calcification. No adenopathy in the abdomen
or pelvis.

Reproductive: Unremarkable

Other: No focal inflammation.  No ascites.

Musculoskeletal: No significant skeletal lesion.
IMPRESSION: 1. Normal appendix
2. Marked right hydronephrosis and ureteral dilatation. There is an
8 x 10 mm calculus which probably is in the urinary bladder lumen
and this could represent recent stone passage. Left nephrolithiasis.
3. Colonic diverticulosis.  No evidence of acute diverticulitis.
4. Aortic atherosclerosis.

## 2019-01-29 DIAGNOSIS — G8929 Other chronic pain: Secondary | ICD-10-CM | POA: Diagnosis not present

## 2019-01-29 DIAGNOSIS — E119 Type 2 diabetes mellitus without complications: Secondary | ICD-10-CM | POA: Diagnosis not present

## 2019-01-29 DIAGNOSIS — Z1159 Encounter for screening for other viral diseases: Secondary | ICD-10-CM | POA: Diagnosis not present

## 2019-01-29 DIAGNOSIS — M545 Low back pain: Secondary | ICD-10-CM | POA: Diagnosis not present

## 2019-01-29 DIAGNOSIS — Z79899 Other long term (current) drug therapy: Secondary | ICD-10-CM | POA: Diagnosis not present

## 2019-02-12 ENCOUNTER — Other Ambulatory Visit: Payer: Self-pay

## 2019-02-12 ENCOUNTER — Ambulatory Visit (AMBULATORY_SURGERY_CENTER): Payer: Medicare Other | Admitting: *Deleted

## 2019-02-12 ENCOUNTER — Ambulatory Visit: Payer: Medicare Other

## 2019-02-12 VITALS — Temp 97.5°F | Ht 72.0 in | Wt 208.0 lb

## 2019-02-12 DIAGNOSIS — Z1159 Encounter for screening for other viral diseases: Secondary | ICD-10-CM

## 2019-02-12 DIAGNOSIS — Z1211 Encounter for screening for malignant neoplasm of colon: Secondary | ICD-10-CM

## 2019-02-12 NOTE — Progress Notes (Signed)
No egg or soy allergy known to patient  No issues with past sedation with any surgeries  or procedures, no intubation problems  No diet pills per patient No home 02 use per patient  No blood thinners per patient  Pt denies issues with constipation  No A fib or A flutter  EMMI video sent to pt's e mail  Pt to have to COV test tgoday as he says he cannot come back to [facility] tomorrow - pt aware no cov test, no col.on  1-5  Due to the COVID-19 pandemic we are asking patients to follow these guidelines. Please only bring one care partner. Please be aware that your care partner may wait in the car in the parking lot or if they feel like they will be too hot to wait in the car, they may wait in the lobby on the 4th floor. All care partners are required to wear a mask the entire time (we do not have any that we can provide them), they need to practice social distancing, and we will do a Covid check for all patient's and care partners when you arrive. Also we will check their temperature and your temperature. If the care partner waits in their car they need to stay in the parking lot the entire time and we will call them on their cell phone when the patient is ready for discharge so they can bring the car to the front of the building. Also all patient's will need to wear a mask into building.

## 2019-02-13 LAB — SARS CORONAVIRUS 2 (TAT 6-24 HRS): SARS Coronavirus 2: NEGATIVE

## 2019-02-19 ENCOUNTER — Ambulatory Visit (AMBULATORY_SURGERY_CENTER): Payer: Medicare Other | Admitting: Gastroenterology

## 2019-02-19 ENCOUNTER — Encounter: Payer: Self-pay | Admitting: Gastroenterology

## 2019-02-19 ENCOUNTER — Other Ambulatory Visit: Payer: Self-pay

## 2019-02-19 VITALS — BP 134/76 | HR 84 | Temp 98.4°F | Resp 15 | Ht 72.0 in | Wt 208.0 lb

## 2019-02-19 DIAGNOSIS — Z86010 Personal history of colonic polyps: Secondary | ICD-10-CM | POA: Diagnosis not present

## 2019-02-19 DIAGNOSIS — D122 Benign neoplasm of ascending colon: Secondary | ICD-10-CM

## 2019-02-19 DIAGNOSIS — D123 Benign neoplasm of transverse colon: Secondary | ICD-10-CM

## 2019-02-19 DIAGNOSIS — Z1211 Encounter for screening for malignant neoplasm of colon: Secondary | ICD-10-CM | POA: Diagnosis not present

## 2019-02-19 DIAGNOSIS — K635 Polyp of colon: Secondary | ICD-10-CM

## 2019-02-19 DIAGNOSIS — D125 Benign neoplasm of sigmoid colon: Secondary | ICD-10-CM

## 2019-02-19 MED ORDER — SODIUM CHLORIDE 0.9 % IV SOLN: 500.0000 mL | Freq: Once | INTRAVENOUS | Status: DC

## 2019-02-19 NOTE — Progress Notes (Signed)
Pt's states no medical or surgical changes since previsit or office visit.  Temp- June Vitals- Courtney 

## 2019-02-19 NOTE — Op Note (Signed)
[facility] Patient Name: David Kelly Procedure Date: 02/19/2019 10:31 AM MRN: [id number] Endoscopist: Justice Britain , MD Age: 74 Referring MD:  Date of Birth: 31-Jul-1945 Gender: Male Account #: [id number] Procedure:                Colonoscopy Indications:              Screening for colorectal malignant neoplasm Medicines:                Monitored Anesthesia Care Procedure:                Pre-Anesthesia Assessment:                           - Prior to the procedure, a History and Physical                            was performed, and patient medications and                            allergies were reviewed. The patient's tolerance of                            previous anesthesia was also reviewed. The risks                            and benefits of the procedure and the sedation                            options and risks were discussed with the patient.                            All questions were answered, and informed consent                            was obtained. Prior Anticoagulants: The patient has                            taken no previous anticoagulant or antiplatelet                            agents except for aspirin. ASA Grade Assessment: II                            - A patient with mild systemic disease. After                            reviewing the risks and benefits, the patient was                            deemed in satisfactory condition to undergo the                            procedure.  After obtaining informed consent, the colonoscope                            was passed under direct vision. Throughout the                            procedure, the patient's blood pressure, pulse, and                            oxygen saturations were monitored continuously. The                            Colonoscope was introduced through the anus and                            advanced to the the cecum, identified by                             appendiceal orifice and ileocecal valve. The                            colonoscopy was performed without difficulty. The                            patient tolerated the procedure. The quality of the                            bowel preparation was adequate. The ileocecal                            valve, appendiceal orifice, and rectum were                            photographed. Scope In: 10:46:57 AM Scope Out: 11:08:10 AM Scope Withdrawal Time: 0 hours 17 minutes 38 seconds  Total Procedure Duration: 0 hours 21 minutes 13 seconds  Findings:                 The digital rectal exam findings include                            hemorrhoids. Pertinent negatives include no                            palpable rectal lesions.                           A moderate amount of semi-liquid stool was found in                            the entire colon, interfering with visualization.                            Lavage of the area was performed using copious  amounts, resulting in clearance with adequate                            visualization.                           Five sessile polyps were found in the sigmoid colon                            (1), transverse colon (1), hepatic flexure (1) and                            ascending colon (2). The polyps were 2 to 4 mm in                            size. These polyps were removed with a cold snare.                            Resection and retrieval were complete.                           A single small-mouthed diverticulum was found in                            the cecum.                           Many small and large-mouthed diverticula were found                            in the entire colon.                           An anal papilla was found. There was some granular                            appearing tissue on the papilla - query developing                            condyloma v  inflammatory                           Non-bleeding non-thrombosed external and internal                            hemorrhoids were found during retroflexion, during                            perianal exam and during digital exam. The                            hemorrhoids were Grade II (internal hemorrhoids                            that prolapse but reduce spontaneously).  Complications:            No immediate complications. Estimated Blood Loss:     Estimated blood loss was minimal. Impression:               - Hemorrhoids found on digital rectal exam.                           - Stool in the entire examined colon.                           - Five 2 to 4 mm polyps in the sigmoid colon, in                            the transverse colon, at the hepatic flexure and in                            the ascending colon, removed with a cold snare.                            Resected and retrieved.                           - Diverticulosis in the cecum.                           - Diverticulosis in the entire examined colon.                           - Anal papilla was hypertrophied - granular tissue                            present query developing condyloma v inflammatory                            tissue.                           - Non-bleeding non-thrombosed external and internal                            hemorrhoids. Recommendation:           - The patient will be observed post-procedure,                            until all discharge criteria are met.                           - Discharge patient to home.                           - Patient has a contact number available for                            emergencies. The signs and symptoms of potential  delayed complications were discussed with the                            patient. Return to normal activities tomorrow.                            Written discharge instructions were provided to the                             patient.                           - High fiber diet.                           - Fibercon daily.                           - Continue present medications.                           - Await pathology results.                           - Repeat colonoscopy 3/06/20/08 years for                            surveillance based on pathology results and                            findings of adenomatous tissue. If hyperplastic                            tissue if found, I would consider this tissue to be                            SSP related and determine follow up on this if                            necessary.                           - Non-urgent referral to Colorectal surgery to                            evaluate the anal papilla and evaluate for                            potential removal/treatment should small condyloma                            be developing.                           - The findings and recommendations were discussed  with the patient. Justice Britain, MD 02/19/2019 11:17:26 AM

## 2019-02-19 NOTE — Progress Notes (Signed)
Report given to PACU, vss 

## 2019-02-19 NOTE — Progress Notes (Signed)
Called to room to assist during endoscopic procedure.  Patient ID and intended procedure confirmed with present staff. Received instructions for my participation in the procedure from the performing physician.  

## 2019-02-19 NOTE — Progress Notes (Signed)
Pt was a little unsteady.  I assisted dressing him.  When I asked pt to sign the post procedure sheet, pt said, "I am tird, can you sign it for me".  I printed his name and I asked him to make a mark by it.  Pt made a small x.  Pt gave me permission to go over finding and discharge instruction with his pastor's wife, Carlisle Cater.  Corky Sing ]

## 2019-02-19 NOTE — Patient Instructions (Signed)
YOU HAD AN ENDOSCOPIC PROCEDURE TODAY AT [facility]:   Refer to the procedure report that was given to you for any specific questions about what was found during the examination.  If the procedure report does not answer your questions, please call your gastroenterologist to clarify.  If you requested that your care partner not be given the details of your procedure findings, then the procedure report has been included in a sealed envelope for you to review at your convenience later.  YOU SHOULD EXPECT: Some feelings of bloating in the abdomen. Passage of more gas than usual.  Walking can help get rid of the air that was put into your GI tract during the procedure and reduce the bloating. If you had a lower endoscopy (such as a colonoscopy or flexible sigmoidoscopy) you may notice spotting of blood in your stool or on the toilet paper. If you underwent a bowel prep for your procedure, you may not have a normal bowel movement for a few days.  Please Note:  You might notice some irritation and congestion in your nose or some drainage.  This is from the oxygen used during your procedure.  There is no need for concern and it should clear up in a day or so.  SYMPTOMS TO REPORT IMMEDIATELY:   Following lower endoscopy (colonoscopy or flexible sigmoidoscopy):  Excessive amounts of blood in the stool  Significant tenderness or worsening of abdominal pains  Swelling of the abdomen that is new, acute  Fever of 100F or higher  For urgent or emergent issues, a gastroenterologist can be reached at any hour by calling [phone].   DIET:  We do recommend a small meal at first, but then you may proceed to your regular diet.  Drink plenty of fluids but you should avoid alcoholic beverages for 24 hours.  ACTIVITY:  You should plan to take it easy for the rest of today and you should NOT DRIVE or use heavy machinery until tomorrow (because of the sedation medicines used during the test).     FOLLOW UP: Our staff will call the number listed on your records 48-72 hours following your procedure to check on you and address any questions or concerns that you may have regarding the information given to you following your procedure. If we do not reach you, we will leave a message.  We will attempt to reach you two times.  During this call, we will ask if you have developed any symptoms of COVID 19. If you develop any symptoms (ie: fever, flu-like symptoms, shortness of breath, cough etc.) before then, please call [phone].  If you test positive for Covid 19 in the 2 weeks post procedure, please call and report this information to [place].    If any biopsies were taken you will be contacted by phone or by letter within the next 1-3 weeks.  Please call us at [phone] if you have not heard about the biopsies in 3 weeks.    SIGNATURES/CONFIDENTIALITY: You and/or your care partner have signed paperwork which will be entered into your electronic medical record.  These signatures attest to the fact that that the information above on your After Visit Summary has been reviewed and is understood.  Full responsibility of the confidentiality of this discharge information lies with you and/or your care-partner.    Handouts were given to you on polyps, diverticulosis, hemorrhoids,and a high fiber diet with liberal fluid intake. Your blood sugar was 118 in the recovery  room. Take Over-The-Counter FIBERCON (fiber supplement) daily. You may resume your other current medications today. Await biopsy results. A non-urgent referral to Colorectal surgery to evaluate the anal papilla and elevate for potential removal or treatment. Please call if any questions or concerns.

## 2019-02-21 ENCOUNTER — Telehealth: Payer: Self-pay

## 2019-02-21 ENCOUNTER — Encounter: Payer: Self-pay | Admitting: Gastroenterology

## 2019-02-21 NOTE — Telephone Encounter (Signed)
  Follow up Call-  Call back number 02/19/2019  Post procedure Call Back phone  # [phone]  Permission to leave phone message Yes  Some recent data might be hidden     Patient questions:  Do you have a fever, pain , or abdominal swelling? No. Pain Score  0 *  Have you tolerated food without any problems? Yes.    Have you been able to return to your normal activities? Yes.    Do you have any questions about your discharge instructions: Diet   No. Medications  No. Follow up visit  No.  Do you have questions or concerns about your Care? No.  Actions: * If pain score is 4 or above: 1. No action needed, pain <4.Have you developed a fever since your procedure? no  2.   Have you had an respiratory symptoms (SOB or cough) since your procedure? no  3.   Have you tested positive for COVID 19 since your procedure no  4.   Have you had any family members/close contacts diagnosed with the COVID 19 since your procedure?  no   If yes to any of these questions please route to Joylene John, RN and Alphonsa Gin, Therapist, sports.

## 2019-02-25 ENCOUNTER — Other Ambulatory Visit: Payer: Self-pay

## 2019-02-25 MED ORDER — IGLUCOSE TEST STRIPS VI STRP: ORAL_STRIP | 12 refills | Status: DC

## 2019-02-27 DIAGNOSIS — M545 Low back pain: Secondary | ICD-10-CM | POA: Diagnosis not present

## 2019-02-27 DIAGNOSIS — F17210 Nicotine dependence, cigarettes, uncomplicated: Secondary | ICD-10-CM | POA: Diagnosis not present

## 2019-02-27 DIAGNOSIS — Z79899 Other long term (current) drug therapy: Secondary | ICD-10-CM | POA: Diagnosis not present

## 2019-02-27 DIAGNOSIS — G8929 Other chronic pain: Secondary | ICD-10-CM | POA: Diagnosis not present

## 2019-02-27 DIAGNOSIS — E119 Type 2 diabetes mellitus without complications: Secondary | ICD-10-CM | POA: Diagnosis not present

## 2019-02-27 DIAGNOSIS — M1711 Unilateral primary osteoarthritis, right knee: Secondary | ICD-10-CM | POA: Diagnosis not present

## 2019-03-11 ENCOUNTER — Ambulatory Visit (INDEPENDENT_AMBULATORY_CARE_PROVIDER_SITE_OTHER): Payer: Medicare Other | Admitting: Internal Medicine

## 2019-03-11 ENCOUNTER — Encounter: Payer: Self-pay | Admitting: Internal Medicine

## 2019-03-11 ENCOUNTER — Other Ambulatory Visit: Payer: Self-pay

## 2019-03-11 VITALS — BP 130/90 | HR 81 | Temp 98.2°F | Ht 72.0 in | Wt 208.0 lb

## 2019-03-11 DIAGNOSIS — E559 Vitamin D deficiency, unspecified: Secondary | ICD-10-CM | POA: Diagnosis not present

## 2019-03-11 DIAGNOSIS — H409 Unspecified glaucoma: Secondary | ICD-10-CM

## 2019-03-11 DIAGNOSIS — E538 Deficiency of other specified B group vitamins: Secondary | ICD-10-CM | POA: Diagnosis not present

## 2019-03-11 DIAGNOSIS — E119 Type 2 diabetes mellitus without complications: Secondary | ICD-10-CM | POA: Diagnosis not present

## 2019-03-11 DIAGNOSIS — Z0000 Encounter for general adult medical examination without abnormal findings: Secondary | ICD-10-CM | POA: Diagnosis not present

## 2019-03-11 DIAGNOSIS — E611 Iron deficiency: Secondary | ICD-10-CM | POA: Diagnosis not present

## 2019-03-11 LAB — CBC WITH DIFFERENTIAL/PLATELET
Basophils Absolute: 0 10*3/uL (ref 0.0–0.1)
Basophils Relative: 0.4 % (ref 0.0–3.0)
Eosinophils Absolute: 0.2 10*3/uL (ref 0.0–0.7)
Eosinophils Relative: 2 % (ref 0.0–5.0)
HCT: 40.4 % (ref 39.0–52.0)
Hemoglobin: 12.9 g/dL — ABNORMAL LOW (ref 13.0–17.0)
Lymphocytes Relative: 24.8 % (ref 12.0–46.0)
Lymphs Abs: 2 10*3/uL (ref 0.7–4.0)
MCHC: 31.9 g/dL (ref 30.0–36.0)
MCV: 84.5 fl (ref 78.0–100.0)
Monocytes Absolute: 0.5 10*3/uL (ref 0.1–1.0)
Monocytes Relative: 6.7 % (ref 3.0–12.0)
Neutro Abs: 5.4 10*3/uL (ref 1.4–7.7)
Neutrophils Relative %: 66.1 % (ref 43.0–77.0)
Platelets: 298 10*3/uL (ref 150.0–400.0)
RBC: 4.79 Mil/uL (ref 4.22–5.81)
RDW: 14.3 % (ref 11.5–15.5)
WBC: 8.2 10*3/uL (ref 4.0–10.5)

## 2019-03-11 LAB — TSH: TSH: 1.69 u[IU]/mL (ref 0.35–4.50)

## 2019-03-11 LAB — VITAMIN B12: Vitamin B-12: 123 pg/mL — ABNORMAL LOW (ref 211–911)

## 2019-03-11 LAB — PSA: PSA: 0.61 ng/mL (ref 0.10–4.00)

## 2019-03-11 LAB — HEMOGLOBIN A1C: Hgb A1c MFr Bld: 8.1 % — ABNORMAL HIGH (ref 4.6–6.5)

## 2019-03-11 MED ORDER — COMBIGAN 0.2-0.5 % OP SOLN: 1.0000 [drp] | Freq: Two times a day (BID) | OPHTHALMIC | 1 refills | Status: DC

## 2019-03-11 NOTE — Assessment & Plan Note (Signed)
For med restart, refer optho for f/u

## 2019-03-11 NOTE — Assessment & Plan Note (Signed)

## 2019-03-11 NOTE — Patient Instructions (Signed)
Please continue all other medications as before, and refills have been done if requested.  Please have the pharmacy call with any other refills you may need.  Please continue your efforts at being more active, low cholesterol diet, and weight control.  You are otherwise up to date with prevention measures today.  Please keep your appointments with your specialists as you may have planned  You will be contacted regarding the referral for: eye doctor for glaucoma  Please go to the LAB at the blood drawing area for the tests to be done  You will be contacted by phone if any changes need to be made immediately.  Otherwise, you will receive a letter about your results with an explanation, but please check with MyChart first.  Please remember to sign up for MyChart if you have not done so, as this will be important to you in the future with finding out test results, communicating by private email, and scheduling acute appointments online when needed.  Please make an Appointment to return in 6 months, or sooner if needed

## 2019-03-11 NOTE — Progress Notes (Signed)
Subjective:    Patient ID: David Kelly, male    DOB: [birth year], 74 y.o.   MRN: [id number]  HPI  Here for wellness and f/u;  Overall doing ok;  Pt denies Chest pain, worsening SOB, DOE, wheezing, orthopnea, PND, worsening LE edema, palpitations, dizziness or syncope.  Pt denies neurological change such as new headache, facial or extremity weakness.  Pt denies polydipsia, polyuria, or low sugar symptoms. Pt states overall good compliance with treatment and medications, good tolerability, and has been trying to follow appropriate diet.  Pt denies worsening depressive symptoms, suicidal ideation or panic. No fever, night sweats, wt loss, loss of appetite, or other constitutional symptoms.  Pt states good ability with ADL's, has low fall risk, home safety reviewed and adequate, no other significant changes in hearing or vision, and only occasionally active with exercise.  No new compalints. Past Medical History:  Diagnosis Date  . Allergy   . Arthritis   . Blood transfusion without reported diagnosis    with bleeding colon per pt in the past   . BPH (benign prostatic hypertrophy)   . Diabetes mellitus    DX  5 YR AGO  . Glaucoma   . H/O: GI bleed   . HLD (hyperlipidemia) 09/28/2017  . Kidney stones   . Myocardial infarction [facility])    in the 1990's   Past Surgical History:  Procedure Laterality Date  . COLONOSCOPY    . CYSTOSCOPY WITH RETROGRADE PYELOGRAM, URETEROSCOPY AND STENT PLACEMENT Right 05/20/2016   Procedure: CYSTOSCOPY, URETEROSCOPY, TRANSURETHRAL INCISION OF URETHROCELE, BASKET STONE REMOVAL;  Surgeon: Irine Seal, MD;  Location: [facility];  Service: Urology;  Laterality: Right;  . Gall Bladder STones removed    . left total knee replacement    . TOTAL KNEE ARTHROPLASTY  04/04/2011   Procedure: TOTAL KNEE ARTHROPLASTY;  Surgeon: Rudean Haskell, MD;  Location: [facility];  Service: Orthopedics;  Laterality: Left;    reports that he quit smoking about 12 years ago. His smoking use  included cigarettes. He has a 67.50 pack-year smoking history. He quit smokeless tobacco use about 5 months ago.  His smokeless tobacco use included chew. He reports that he does not drink alcohol or use drugs. family history includes Arthritis in his mother; Asthma in his paternal grandfather; Colon polyps in his brother; Diabetes in his sister. No Known Allergies Current Outpatient Medications on File Prior to Visit  Medication Sig Dispense Refill  . aspirin EC 81 MG tablet Take 81 mg by mouth at bedtime.    Marland Kitchen atorvastatin (LIPITOR) 40 MG tablet Take 1 tablet (40 mg total) by mouth daily. 90 tablet 3  . diclofenac sodium (VOLTAREN) 1 % GEL Apply 1 application topically 3 (three) times daily. Right knee and both shoulders. 3 Tube 4  . Fluocinolone Acetonide 0.01 % OIL Place 5 drops in ear(s) 2 (two) times daily. 20 mL 1  . glipiZIDE (GLUCOTROL XL) 5 MG 24 hr tablet Take 1 tablet (5 mg total) by mouth daily with breakfast. 90 tablet 3  . glucose blood (IGLUCOSE TEST STRIPS) test strip Use as instructed 100 each 12  . HYDROcodone-acetaminophen (NORCO) 10-325 MG tablet Take 1 tablet by mouth every 8 (eight) hours as needed. 90 tablet 0  . levocetirizine (XYZAL) 5 MG tablet     . metFORMIN (GLUCOPHAGE) 1000 MG tablet Take 1 tablet (1,000 mg total) by mouth 2 (two) times daily with a meal. 180 tablet 3  . montelukast (SINGULAIR) 10 MG tablet  Take 1 tablet (10 mg total) by mouth at bedtime. 90 tablet 3  . Multiple Vitamin (MULTIVITAMIN WITH MINERALS) TABS tablet Take 1 tablet by mouth daily.    . naproxen (NAPROSYN) 500 MG tablet Take by mouth.    . Promethazine-Codeine 6.25-10 MG/5ML SOLN TAKE 1 TEASPOONFUL BY MOUTH EVERY 4 HOURS AS NEEDED FOR COUGH 180 mL 0  . psyllium (METAMUCIL) 58.6 % packet Take 1 packet by mouth daily.    . tamsulosin (FLOMAX) 0.4 MG CAPS capsule Take 1 capsule (0.4 mg total) by mouth at bedtime. 90 capsule 3  . triamcinolone ointment (KENALOG) 0.5 % Apply 1 application  topically 2 (two) times daily. 30 g 2  . TRUEplus Lancets 28G MISC      No current facility-administered medications on file prior to visit.   Review of Systems All otherwise neg per pt     Objective:   Physical Exam BP 130/90   Pulse 81   Temp 98.2 F (36.8 C) (Oral)   Ht 6' (1.829 m)   Wt 208 lb (94.3 kg)   SpO2 98%   BMI 28.21 kg/m  VS noted,  Constitutional: Pt appears in NAD HENT: Head: NCAT.  Right Ear: External ear normal.  Left Ear: External ear normal.  Eyes: . Pupils are equal, round, and reactive to light. Conjunctivae and EOM are normal Nose: without d/c or deformity Neck: Neck supple. Gross normal ROM Cardiovascular: Normal rate and regular rhythm.   Pulmonary/Chest: Effort normal and breath sounds without rales or wheezing.  Abd:  Soft, NT, ND, + BS, no organomegaly Neurological: Pt is alert. At baseline orientation, motor grossly intact Skin: Skin is warm. No rashes, other new lesions, no LE edema Psychiatric: Pt behavior is normal without agitation  All otherwise neg per pt Lab Results  Component Value Date   WBC 8.3 09/28/2017   HGB 13.4 09/28/2017   HCT 41.5 09/28/2017   PLT 265.0 09/28/2017   GLUCOSE 115 (H) 09/28/2017   CHOL 270 (H) 09/28/2017   TRIG 183.0 (H) 09/28/2017   HDL 33.30 (L) 09/28/2017   LDLDIRECT 86.0 03/31/2017   LDLCALC 200 (H) 09/28/2017   ALT 22 09/28/2017   AST 20 09/28/2017   NA 139 09/28/2017   K 4.4 09/28/2017   CL 104 09/28/2017   CREATININE 0.77 09/28/2017   BUN 13 09/28/2017   CO2 28 09/28/2017   TSH 0.77 09/28/2017   PSA 0.75 09/28/2017   INR 1.06 03/22/2011   HGBA1C 7.6 (A) 09/06/2018   MICROALBUR 1.9 09/28/2017      Assessment & Plan:

## 2019-03-11 NOTE — Assessment & Plan Note (Signed)
stable overall by history and exam, recent data reviewed with pt, and pt to continue medical treatment as before,  to f/u any worsening symptoms or concerns  

## 2019-03-12 ENCOUNTER — Other Ambulatory Visit: Payer: Self-pay | Admitting: Internal Medicine

## 2019-03-12 ENCOUNTER — Encounter: Payer: Self-pay | Admitting: Internal Medicine

## 2019-03-12 LAB — URINALYSIS, ROUTINE W REFLEX MICROSCOPIC
Bilirubin Urine: NEGATIVE
Hgb urine dipstick: NEGATIVE
Ketones, ur: NEGATIVE
Leukocytes,Ua: NEGATIVE
Nitrite: NEGATIVE
RBC / HPF: NONE SEEN (ref 0–?)
Specific Gravity, Urine: 1.025 (ref 1.000–1.030)
Total Protein, Urine: NEGATIVE
Urine Glucose: NEGATIVE
Urobilinogen, UA: 0.2 (ref 0.0–1.0)
pH: 5 (ref 5.0–8.0)

## 2019-03-12 LAB — MICROALBUMIN / CREATININE URINE RATIO
Creatinine,U: 91 mg/dL
Microalb Creat Ratio: 4.5 mg/g (ref 0.0–30.0)
Microalb, Ur: 4.1 mg/dL — ABNORMAL HIGH (ref 0.0–1.9)

## 2019-03-12 LAB — LIPID PANEL
Cholesterol: 241 mg/dL — ABNORMAL HIGH (ref 0–200)
HDL: 40.1 mg/dL (ref >39.00)
LDL Cholesterol: 173 mg/dL — ABNORMAL HIGH (ref 0–99)
NonHDL: 200.51
Total CHOL/HDL Ratio: 6
Triglycerides: 136 mg/dL (ref 0.0–149.0)
VLDL: 27.2 mg/dL (ref 0.0–40.0)

## 2019-03-12 LAB — BASIC METABOLIC PANEL
BUN: 14 mg/dL (ref 6–23)
CO2: 24 mEq/L (ref 19–32)
Calcium: 9.6 mg/dL (ref 8.4–10.5)
Chloride: 103 mEq/L (ref 96–112)
Creatinine, Ser: 0.73 mg/dL (ref 0.40–1.50)
GFR: 127.09 mL/min (ref >60.00)
Glucose, Bld: 105 mg/dL — ABNORMAL HIGH (ref 70–99)
Potassium: 4.5 mEq/L (ref 3.5–5.1)
Sodium: 138 mEq/L (ref 135–145)

## 2019-03-12 LAB — HEPATIC FUNCTION PANEL
ALT: 25 U/L (ref 0–53)
AST: 18 U/L (ref 0–37)
Albumin: 4.5 g/dL (ref 3.5–5.2)
Alkaline Phosphatase: 58 U/L (ref 39–117)
Bilirubin, Direct: 0.1 mg/dL (ref 0.0–0.3)
Total Bilirubin: 0.3 mg/dL (ref 0.2–1.2)
Total Protein: 7.2 g/dL (ref 6.0–8.3)

## 2019-03-12 LAB — IBC PANEL
Iron: 85 ug/dL (ref 42–165)
Saturation Ratios: 22.8 % (ref 20.0–50.0)
Transferrin: 266 mg/dL (ref 212.0–360.0)

## 2019-03-12 MED ORDER — GLIPIZIDE 10 MG PO TABS: 10.0000 mg | ORAL_TABLET | Freq: Every day | ORAL | 3 refills | Status: DC

## 2019-03-12 MED ORDER — ATORVASTATIN CALCIUM 40 MG PO TABS: 40.0000 mg | ORAL_TABLET | Freq: Every day | ORAL | 3 refills | Status: DC

## 2019-03-12 MED ORDER — VITAMIN B-12 1000 MCG PO TABS: 1000.0000 ug | ORAL_TABLET | Freq: Every day | ORAL | 1 refills | Status: DC

## 2019-03-13 ENCOUNTER — Telehealth: Payer: Self-pay

## 2019-03-13 NOTE — Telephone Encounter (Signed)
New message    Calling for test results  

## 2019-03-13 NOTE — Telephone Encounter (Signed)
Letter was sent and message left for pt to call for results  This is results as I documented on lab results last visit;    Letter sent, cont same tx except   The test results show that your current treatment is OK, as the tests are stable, except the A1c is moderately high, the LDL cholesterol is moderately high, and the Vitamin B12 level is low.     We need to: 1)  Increase the glipizide ER to 10 mg per day 2)  Make sure to take the statin medication as prescribed and follow a lower cholesterol diet 3)  Take B12 pill - 1 per day - for 6 months   Staff to please inform pt, I will do rx x 3

## 2019-03-13 NOTE — Telephone Encounter (Signed)
Dr. [name] patient called requesting lab results

## 2019-03-14 NOTE — Telephone Encounter (Signed)
Called patient and discussed lab results.  Patient voiced understanding

## 2019-03-21 ENCOUNTER — Ambulatory Visit (INDEPENDENT_AMBULATORY_CARE_PROVIDER_SITE_OTHER): Payer: Medicare Other | Admitting: Sports Medicine

## 2019-03-21 ENCOUNTER — Other Ambulatory Visit: Payer: Self-pay

## 2019-03-21 ENCOUNTER — Encounter: Payer: Self-pay | Admitting: Sports Medicine

## 2019-03-21 DIAGNOSIS — M79676 Pain in unspecified toe(s): Secondary | ICD-10-CM

## 2019-03-21 DIAGNOSIS — B351 Tinea unguium: Secondary | ICD-10-CM

## 2019-03-21 DIAGNOSIS — E119 Type 2 diabetes mellitus without complications: Secondary | ICD-10-CM

## 2019-03-21 NOTE — Progress Notes (Signed)
Subjective: David Kelly is a 74 y.o. male patient with history of diabetes who returns to office today complaining of long, painful nails while ambulating in shoes; unable to trim.   Patient states that the glucose reading today was 143mg /dl. Last A1c unknown.  Saw primary care Dr. Cathlean Cower x 2 weeks ago  No other issues noted   Patient Active Problem List   Diagnosis Date Noted  . Abscess of finger 09/06/2018  . MI (myocardial infarction) ([place]) 12/11/2017  . History of left knee replacement 12/11/2017  . HLD (hyperlipidemia) 09/28/2017  . Cough 03/31/2017  . Primary osteoarthritis of right knee 03/15/2017  . Unspecified injury of lower back, sequela 03/15/2017  . Bilateral rotator cuff syndrome 03/15/2017  . Recurrent falls while walking 12/13/2016  . Preventative health care 12/13/2016  . Chronic pain 12/13/2016  . Kidney stones   . Glaucoma   . Gastrointestinal bleed 06/28/2016  . Dysuria 10/28/2015  . Itching of ear 08/13/2015  . Type 2 diabetes mellitus ([place]) 07/15/2015  . Osteoarthritis 07/15/2015   Current Outpatient Medications on File Prior to Visit  Medication Sig Dispense Refill  . aspirin EC 81 MG tablet Take 81 mg by mouth at bedtime.    Marland Kitchen atorvastatin (LIPITOR) 40 MG tablet Take 1 tablet (40 mg total) by mouth daily. 90 tablet 3  . brimonidine-timolol (COMBIGAN) 0.2-0.5 % ophthalmic solution Place 1 drop into both eyes every 12 (twelve) hours. 15 mL 1  . diclofenac sodium (VOLTAREN) 1 % GEL Apply 1 application topically 3 (three) times daily. Right knee and both shoulders. 3 Tube 4  . Fluocinolone Acetonide 0.01 % OIL Place 5 drops in ear(s) 2 (two) times daily. 20 mL 1  . glipiZIDE (GLUCOTROL) 10 MG tablet Take 1 tablet (10 mg total) by mouth daily before breakfast. 90 tablet 3  . glucose blood (IGLUCOSE TEST STRIPS) test strip Use as instructed 100 each 12  . HYDROcodone-acetaminophen (NORCO) 10-325 MG tablet Take 1 tablet by mouth every 8 (eight)  hours as needed. 90 tablet 0  . levocetirizine (XYZAL) 5 MG tablet     . metFORMIN (GLUCOPHAGE) 1000 MG tablet Take 1 tablet (1,000 mg total) by mouth 2 (two) times daily with a meal. 180 tablet 3  . montelukast (SINGULAIR) 10 MG tablet Take 1 tablet (10 mg total) by mouth at bedtime. 90 tablet 3  . Multiple Vitamin (MULTIVITAMIN WITH MINERALS) TABS tablet Take 1 tablet by mouth daily.    . naproxen (NAPROSYN) 500 MG tablet Take by mouth.    . Promethazine-Codeine 6.25-10 MG/5ML SOLN TAKE 1 TEASPOONFUL BY MOUTH EVERY 4 HOURS AS NEEDED FOR COUGH 180 mL 0  . psyllium (METAMUCIL) 58.6 % packet Take 1 packet by mouth daily.    . tamsulosin (FLOMAX) 0.4 MG CAPS capsule Take 1 capsule (0.4 mg total) by mouth at bedtime. 90 capsule 3  . triamcinolone ointment (KENALOG) 0.5 % Apply 1 application topically 2 (two) times daily. 30 g 2  . TRUEplus Lancets 28G MISC     . vitamin B-12 (CYANOCOBALAMIN) 1000 MCG tablet Take 1 tablet (1,000 mcg total) by mouth daily. 90 tablet 1   No current facility-administered medications on file prior to visit.   No Known Allergies  Recent Results (from the past 2160 hour(s))  SARS Coronavirus 2 ([facility] ONLY)     Status: None   Collection Time: 02/12/19 12:00 AM   Specimen: Nasopharyngeal Swab  Result Value Ref Range   SARS Coronavirus 2 RESULT:  NEGATIVE     Comment: RESULT:  NEGATIVESARS-CoV-2 INTERPRETATION:A NEGATIVE  test result means that SARS-CoV-2 RNA was not present in the specimen above the limit of detection of this test. This does not preclude a possible SARS-CoV-2 infection and should not be used as the  sole basis for patient management decisions. Negative results must be combined with clinical observations, patient history, and epidemiological information. Optimum specimen types and timing for peak viral levels during infections caused by SARS-CoV-2  have not been determined. Collection of multiple specimens or types of specimens may be necessary  to detect virus. Improper specimen collection and handling, sequence variability under primers/probes, or organism present below the limit of detection may  lead to false negative results. Positive and negative predictive values of testing are highly dependent on prevalence. False negative test results are more likely when prevalence of disease is high.The expected result is NEGATIVE.Fact  Sheet for  Healthcare Providers: https://www.woods-mathews.com/.Fact Sheet for Patients: SugarRoll.be.Normal Reference Range - Negative   Microalbumin / creatinine urine ratio     Status: Abnormal   Collection Time: 03/11/19  3:48 PM  Result Value Ref Range   Microalb, Ur 4.1 (H) 0.0 - 1.9 mg/dL   Creatinine,U 91.0 mg/dL   Microalb Creat Ratio 4.5 0.0 - 30.0 mg/g  Hemoglobin A1c     Status: Abnormal   Collection Time: 03/11/19  3:48 PM  Result Value Ref Range   Hgb A1c MFr Bld 8.1 (H) 4.6 - 6.5 %    Comment: Glycemic Control Guidelines for People with Diabetes:Non Diabetic:  <6%Goal of Therapy: <7%Additional Action Suggested:  >8%   Lipid panel     Status: Abnormal   Collection Time: 03/11/19  3:48 PM  Result Value Ref Range   Cholesterol 241 (H) 0 - 200 mg/dL    Comment: ATP III Classification       Desirable:  < 200 mg/dL               Borderline High:  200 - 239 mg/dL          High:  > = 240 mg/dL   Triglycerides 136.0 0.0 - 149.0 mg/dL    Comment: Normal:  <150 mg/dLBorderline High:  150 - 199 mg/dL   HDL 40.10 >39.00 mg/dL   VLDL 27.2 0.0 - 40.0 mg/dL   LDL Cholesterol 173 (H) 0 - 99 mg/dL   Total CHOL/HDL Ratio 6     Comment:                Men          Women1/2 Average Risk     3.4          3.3Average Risk          5.0          4.42X Average Risk          9.6          7.13X Average Risk          15.0          11.0                       NonHDL 200.51     Comment: NOTE:  Non-HDL goal should be 30 mg/dL higher than patient's LDL goal (i.e. LDL goal of < 70  mg/dL, would have non-HDL goal of < 100 mg/dL)  Basic metabolic panel     Status: Abnormal   Collection Time: 03/11/19  3:48 PM  Result Value Ref Range   Sodium 138 135 - 145 mEq/L   Potassium 4.5 3.5 - 5.1 mEq/L   Chloride 103 96 - 112 mEq/L   CO2 24 19 - 32 mEq/L   Glucose, Bld 105 (H) 70 - 99 mg/dL   BUN 14 6 - 23 mg/dL   Creatinine, Ser 0.73 0.40 - 1.50 mg/dL   GFR 127.09 >60.00 mL/min   Calcium 9.6 8.4 - 10.5 mg/dL  Hepatic function panel     Status: None   Collection Time: 03/11/19  3:48 PM  Result Value Ref Range   Total Bilirubin 0.3 0.2 - 1.2 mg/dL   Bilirubin, Direct 0.1 0.0 - 0.3 mg/dL   Alkaline Phosphatase 58 39 - 117 U/L   AST 18 0 - 37 U/L   ALT 25 0 - 53 U/L   Total Protein 7.2 6.0 - 8.3 g/dL   Albumin 4.5 3.5 - 5.2 g/dL  CBC with Differential/Platelet     Status: Abnormal   Collection Time: 03/11/19  3:48 PM  Result Value Ref Range   WBC 8.2 4.0 - 10.5 K/uL   RBC 4.79 4.22 - 5.81 Mil/uL   Hemoglobin 12.9 (L) 13.0 - 17.0 g/dL   HCT 40.4 39.0 - 52.0 %   MCV 84.5 78.0 - 100.0 fl   MCHC 31.9 30.0 - 36.0 g/dL   RDW 14.3 11.5 - 15.5 %   Platelets 298.0 150.0 - 400.0 K/uL   Neutrophils Relative % 66.1 43.0 - 77.0 %   Lymphocytes Relative 24.8 12.0 - 46.0 %   Monocytes Relative 6.7 3.0 - 12.0 %   Eosinophils Relative 2.0 0.0 - 5.0 %   Basophils Relative 0.4 0.0 - 3.0 %   Neutro Abs 5.4 1.4 - 7.7 K/uL   Lymphs Abs 2.0 0.7 - 4.0 K/uL   Monocytes Absolute 0.5 0.1 - 1.0 K/uL   Eosinophils Absolute 0.2 0.0 - 0.7 K/uL   Basophils Absolute 0.0 0.0 - 0.1 K/uL  TSH     Status: None   Collection Time: 03/11/19  3:48 PM  Result Value Ref Range   TSH 1.69 0.35 - 4.50 uIU/mL  Urinalysis, Routine w reflex microscopic     Status: Abnormal   Collection Time: 03/11/19  3:48 PM  Result Value Ref Range   Color, Urine YELLOW Yellow;Lt. Yellow;Straw;Dark Yellow;Amber;Green;Red;Brown   APPearance CLEAR Clear;Turbid;Slightly Cloudy;Cloudy   Specific Gravity, Urine 1.025 1.000 -  1.030   pH 5.0 5.0 - 8.0   Total Protein, Urine NEGATIVE Negative   Urine Glucose NEGATIVE Negative   Ketones, ur NEGATIVE Negative   Bilirubin Urine NEGATIVE Negative   Hgb urine dipstick NEGATIVE Negative   Urobilinogen, UA 0.2 0.0 - 1.0   Leukocytes,Ua NEGATIVE Negative   Nitrite NEGATIVE Negative   WBC, UA 0-2/hpf 0-2/hpf   RBC / HPF none seen 0-2/hpf   Mucus, UA Presence of (A) None  PSA     Status: None   Collection Time: 03/11/19  3:48 PM  Result Value Ref Range   PSA 0.61 0.10 - 4.00 ng/mL    Comment: Test performed using Access [employer]britech PSA Assay, a parmagnetic partical, chemiluminecent immunoassay.  Vitamin B12     Status: Abnormal   Collection Time: 03/11/19  3:48 PM  Result Value Ref Range   Vitamin B-12 123 (L) 211 - 911 pg/mL  IBC panel     Status: None   Collection Time: 03/11/19  3:48 PM  Result Value Ref Range  Iron 85 42 - 165 ug/dL   Transferrin 266.0 212.0 - 360.0 mg/dL   Saturation Ratios 22.8 20.0 - 50.0 %    Objective: General: Patient is awake, alert, and oriented x 3 and in no acute distress. No acute changes with physical exam  Integument: Skin is warm, dry and supple bilateral. Nails are tender, mildly elongated, thickened and dystrophic with subungual debris, consistent with onychomycosis, 1-5 bilateral. No signs of infection. No open lesions or preulcerative lesions present bilateral.  Remaining integument unremarkable.  Vasculature:  Dorsalis Pedis pulse 2/4 bilateral. Posterior Tibial pulse 1 /4 bilateral. Capillary fill time <3 sec 1-5 bilateral. Positive hair growth to the level of the digits.Temperature gradient within normal limits. No varicosities present bilateral. No edema present bilateral.   Neurology: The patient has intact sensation measured with a 5.07/10g Semmes Weinstein Monofilament at all pedal sites bilateral . Vibratory sensation diminished bilateral with tuning fork. No Babinski sign present bilateral.   Musculoskeletal:  Asymptomatic bunion and hammertoe and planus pedal deformities noted bilateral. Muscular strength 5/5 in all lower extremity muscular groups bilateral without pain on range of motion. No tenderness with calf compression bilateral.  Assessment and Plan: Problem List Items Addressed This Visit    None    Visit Diagnoses    Pain due to onychomycosis of toenail    -  Primary   Diabetes mellitus without complication ([place])         -Examined patient. -Re-Discussed and educated patient on diabetic foot care -Mechanically debrided all nails 1-5 bilateral using sterile nail nipper and filed with dremel without incident -Patient to return  in 3 months for at risk foot care or sooner if there are any problems or issues -Reports that no longer using diabetic shoes because they are too big and has given them away -Patient advised to call the office if any problems or questions arise in the meantime.  Landis Martins, DPM

## 2019-03-29 DIAGNOSIS — G8929 Other chronic pain: Secondary | ICD-10-CM | POA: Diagnosis not present

## 2019-03-29 DIAGNOSIS — M545 Low back pain: Secondary | ICD-10-CM | POA: Diagnosis not present

## 2019-03-29 DIAGNOSIS — F17210 Nicotine dependence, cigarettes, uncomplicated: Secondary | ICD-10-CM | POA: Diagnosis not present

## 2019-03-29 DIAGNOSIS — Z79891 Long term (current) use of opiate analgesic: Secondary | ICD-10-CM | POA: Diagnosis not present

## 2019-03-29 DIAGNOSIS — Z79899 Other long term (current) drug therapy: Secondary | ICD-10-CM | POA: Diagnosis not present

## 2019-04-26 DIAGNOSIS — M545 Low back pain: Secondary | ICD-10-CM | POA: Diagnosis not present

## 2019-04-26 DIAGNOSIS — Z79891 Long term (current) use of opiate analgesic: Secondary | ICD-10-CM | POA: Diagnosis not present

## 2019-04-26 DIAGNOSIS — Z79899 Other long term (current) drug therapy: Secondary | ICD-10-CM | POA: Diagnosis not present

## 2019-04-26 DIAGNOSIS — G8929 Other chronic pain: Secondary | ICD-10-CM | POA: Diagnosis not present

## 2019-05-14 ENCOUNTER — Other Ambulatory Visit: Payer: Self-pay | Admitting: Internal Medicine

## 2019-05-20 DIAGNOSIS — Z79891 Long term (current) use of opiate analgesic: Secondary | ICD-10-CM | POA: Diagnosis not present

## 2019-05-20 DIAGNOSIS — G8929 Other chronic pain: Secondary | ICD-10-CM | POA: Diagnosis not present

## 2019-05-20 DIAGNOSIS — Z79899 Other long term (current) drug therapy: Secondary | ICD-10-CM | POA: Diagnosis not present

## 2019-05-20 DIAGNOSIS — F17210 Nicotine dependence, cigarettes, uncomplicated: Secondary | ICD-10-CM | POA: Diagnosis not present

## 2019-05-20 DIAGNOSIS — M545 Low back pain: Secondary | ICD-10-CM | POA: Diagnosis not present

## 2019-06-10 ENCOUNTER — Other Ambulatory Visit: Payer: Self-pay

## 2019-06-10 DIAGNOSIS — E119 Type 2 diabetes mellitus without complications: Secondary | ICD-10-CM

## 2019-06-10 MED ORDER — TRUEPLUS LANCETS 28G MISC: 28.0000 g | 3 refills | Status: DC

## 2019-06-14 ENCOUNTER — Telehealth: Payer: Self-pay

## 2019-06-14 NOTE — Telephone Encounter (Signed)
No, sept should be ok

## 2019-06-14 NOTE — Telephone Encounter (Signed)
Ok will try to address at next visit

## 2019-06-14 NOTE — Telephone Encounter (Signed)
New message    [facility] calling patient has DM may not be on a statin medication.   Per 2020 ADA & ACC / HA Cholesterol  Guideline recommendation asking Dr. [name] to re-eval his medical condition.

## 2019-06-18 DIAGNOSIS — F17210 Nicotine dependence, cigarettes, uncomplicated: Secondary | ICD-10-CM | POA: Diagnosis not present

## 2019-06-18 DIAGNOSIS — M545 Low back pain: Secondary | ICD-10-CM | POA: Diagnosis not present

## 2019-06-18 DIAGNOSIS — G8929 Other chronic pain: Secondary | ICD-10-CM | POA: Diagnosis not present

## 2019-06-18 DIAGNOSIS — M1711 Unilateral primary osteoarthritis, right knee: Secondary | ICD-10-CM | POA: Diagnosis not present

## 2019-06-18 DIAGNOSIS — Z79899 Other long term (current) drug therapy: Secondary | ICD-10-CM | POA: Diagnosis not present

## 2019-06-20 ENCOUNTER — Ambulatory Visit (INDEPENDENT_AMBULATORY_CARE_PROVIDER_SITE_OTHER): Payer: Medicare Other | Admitting: Sports Medicine

## 2019-06-20 ENCOUNTER — Other Ambulatory Visit: Payer: Self-pay

## 2019-06-20 DIAGNOSIS — B351 Tinea unguium: Secondary | ICD-10-CM

## 2019-06-20 DIAGNOSIS — E119 Type 2 diabetes mellitus without complications: Secondary | ICD-10-CM | POA: Diagnosis not present

## 2019-06-20 DIAGNOSIS — M79676 Pain in unspecified toe(s): Secondary | ICD-10-CM | POA: Diagnosis not present

## 2019-06-20 DIAGNOSIS — M2140 Flat foot [pes planus] (acquired), unspecified foot: Secondary | ICD-10-CM

## 2019-06-20 DIAGNOSIS — M2042 Other hammer toe(s) (acquired), left foot: Secondary | ICD-10-CM

## 2019-06-20 DIAGNOSIS — M2041 Other hammer toe(s) (acquired), right foot: Secondary | ICD-10-CM

## 2019-06-21 ENCOUNTER — Encounter: Payer: Self-pay | Admitting: Sports Medicine

## 2019-06-21 NOTE — Progress Notes (Signed)
Subjective: David Kelly is a 74 y.o. male patient with history of diabetes who returns to office today complaining of long, painful nails while ambulating in shoes; unable to trim. Denies any other pedal complaints.   Patient states that the glucose reading today was 153mg /dl. Last A1c unknown.  Saw primary care Dr. Cathlean Cower x few months ago   Patient Active Problem List   Diagnosis Date Noted  . Abscess of finger 09/06/2018  . MI (myocardial infarction) ([place]) 12/11/2017  . History of left knee replacement 12/11/2017  . HLD (hyperlipidemia) 09/28/2017  . Cough 03/31/2017  . Primary osteoarthritis of right knee 03/15/2017  . Unspecified injury of lower back, sequela 03/15/2017  . Bilateral rotator cuff syndrome 03/15/2017  . Recurrent falls while walking 12/13/2016  . Preventative health care 12/13/2016  . Chronic pain 12/13/2016  . Kidney stones   . Glaucoma   . Gastrointestinal bleed 06/28/2016  . Dysuria 10/28/2015  . Itching of ear 08/13/2015  . Type 2 diabetes mellitus ([place]) 07/15/2015  . Osteoarthritis 07/15/2015   Current Outpatient Medications on File Prior to Visit  Medication Sig Dispense Refill  . aspirin EC 81 MG tablet Take 81 mg by mouth at bedtime.    Marland Kitchen atorvastatin (LIPITOR) 40 MG tablet Take 1 tablet (40 mg total) by mouth daily. 90 tablet 3  . brimonidine-timolol (COMBIGAN) 0.2-0.5 % ophthalmic solution Place 1 drop into both eyes every 12 (twelve) hours. 15 mL 1  . diclofenac sodium (VOLTAREN) 1 % GEL Apply 1 application topically 3 (three) times daily. Right knee and both shoulders. 3 Tube 4  . Fluocinolone Acetonide 0.01 % OIL Place 5 drops in ear(s) 2 (two) times daily. 20 mL 1  . glipiZIDE (GLUCOTROL) 10 MG tablet Take 1 tablet (10 mg total) by mouth daily before breakfast. 90 tablet 3  . glucose blood (IGLUCOSE TEST STRIPS) test strip Use as instructed 100 each 12  . HYDROcodone-acetaminophen (NORCO) 10-325 MG tablet Take 1 tablet by mouth every  8 (eight) hours as needed. 90 tablet 0  . levocetirizine (XYZAL) 5 MG tablet     . metFORMIN (GLUCOPHAGE) 1000 MG tablet Take 1 tablet (1,000 mg total) by mouth 2 (two) times daily with a meal. 180 tablet 3  . montelukast (SINGULAIR) 10 MG tablet Take 1 tablet (10 mg total) by mouth at bedtime. 90 tablet 3  . Multiple Vitamin (MULTIVITAMIN WITH MINERALS) TABS tablet Take 1 tablet by mouth daily.    . naproxen (NAPROSYN) 500 MG tablet Take by mouth.    . Promethazine-Codeine 6.25-10 MG/5ML SOLN TAKE 1 TEASPOONFUL BY MOUTH EVERY 4 HOURS AS NEEDED FOR COUGH 180 mL 0  . psyllium (METAMUCIL) 58.6 % packet Take 1 packet by mouth daily.    . tamsulosin (FLOMAX) 0.4 MG CAPS capsule Take 1 capsule (0.4 mg total) by mouth at bedtime. 90 capsule 3  . triamcinolone ointment (KENALOG) 0.5 % Apply 1 application topically 2 (two) times daily. 30 g 2  . TRUEplus Lancets 28G MISC 28 g by Other route as directed. 100 each 3  . vitamin B-12 (CYANOCOBALAMIN) 1000 MCG tablet Take 1 tablet (1,000 mcg total) by mouth daily. 90 tablet 1   No current facility-administered medications on file prior to visit.   No Known Allergies  No results found for this or any previous visit (from the past 2160 hour(s)).  Objective: General: Patient is awake, alert, and oriented x 3 and in no acute distress. No acute changes with physical exam  Integument: Skin is warm, dry and supple bilateral. Nails are tender, mildly elongated, thickened and dystrophic with subungual debris, consistent with onychomycosis, 1-5 bilateral. No signs of infection. No open lesions or preulcerative lesions present bilateral.  Remaining integument unremarkable.  Vasculature:  Dorsalis Pedis pulse 2/4 bilateral. Posterior Tibial pulse 1 /4 bilateral. Capillary fill time <3 sec 1-5 bilateral. Positive hair growth to the level of the digits.Temperature gradient within normal limits. No varicosities present bilateral. No edema present bilateral.    Neurology: The patient has intact sensation measured with a 5.07/10g Semmes Weinstein Monofilament at all pedal sites bilateral . Vibratory sensation diminished bilateral with tuning fork. No Babinski sign present bilateral.   Musculoskeletal: Asymptomatic bunion and hammertoe and planus pedal deformities noted bilateral. Muscular strength 5/5 in all lower extremity muscular groups bilateral without pain on range of motion. No tenderness with calf compression bilateral.  Assessment and Plan: Problem List Items Addressed This Visit    None    Visit Diagnoses    Pain due to onychomycosis of toenail    -  Primary   Diabetes mellitus without complication ([facility])       Hammer toes of both feet       Pes planus, unspecified laterality         -Examined patient. -Re-Discussed and educated patient on diabetic foot care -Mechanically debrided all nails 1-5 bilateral using sterile nail nipper and filed with dremel without incident -Patient to return  in 3 months for at risk foot care or sooner if there are any problems or issues  Landis Martins, DPM

## 2019-07-04 ENCOUNTER — Other Ambulatory Visit: Payer: Self-pay | Admitting: Internal Medicine

## 2019-07-04 NOTE — Telephone Encounter (Signed)
Done erx 

## 2019-07-11 ENCOUNTER — Telehealth: Payer: Self-pay

## 2019-07-11 MED ORDER — FLUTICASONE PROPIONATE 50 MCG/ACT NA SUSP: 2.0000 | Freq: Every day | NASAL | 6 refills | Status: DC

## 2019-07-11 NOTE — Telephone Encounter (Signed)
Ok for flonase  I would not be able to refill the steroid oil for the ears

## 2019-07-11 NOTE — Telephone Encounter (Signed)
1.Medication Requested:  Fluocinolone Acetonide 0.01 % OIL  Fluticasone  - nasal spray   2. Pharmacy (Name, Street, City):[facility], [place] [facility] [place]  3. On Med List: Yes   4. Last Visit with PCP: 1.25.21   5. Next visit date with PCP: n/a   Agent: Please be advised that RX refills may take up to 3 business days. We ask that you follow-up with your pharmacy.

## 2019-07-16 NOTE — Telephone Encounter (Signed)
Spoke with patient and info given 

## 2019-07-17 ENCOUNTER — Other Ambulatory Visit: Payer: Self-pay | Admitting: Internal Medicine

## 2019-07-19 DIAGNOSIS — Z79891 Long term (current) use of opiate analgesic: Secondary | ICD-10-CM | POA: Diagnosis not present

## 2019-07-19 DIAGNOSIS — Z79899 Other long term (current) drug therapy: Secondary | ICD-10-CM | POA: Diagnosis not present

## 2019-07-19 DIAGNOSIS — G8929 Other chronic pain: Secondary | ICD-10-CM | POA: Diagnosis not present

## 2019-07-19 DIAGNOSIS — F17210 Nicotine dependence, cigarettes, uncomplicated: Secondary | ICD-10-CM | POA: Diagnosis not present

## 2019-07-19 DIAGNOSIS — M545 Low back pain: Secondary | ICD-10-CM | POA: Diagnosis not present

## 2019-08-03 ENCOUNTER — Other Ambulatory Visit: Payer: Self-pay | Admitting: Internal Medicine

## 2019-08-15 DIAGNOSIS — Z720 Tobacco use: Secondary | ICD-10-CM | POA: Diagnosis not present

## 2019-08-15 DIAGNOSIS — F17210 Nicotine dependence, cigarettes, uncomplicated: Secondary | ICD-10-CM | POA: Diagnosis not present

## 2019-08-15 DIAGNOSIS — G8929 Other chronic pain: Secondary | ICD-10-CM | POA: Diagnosis not present

## 2019-08-15 DIAGNOSIS — M545 Low back pain: Secondary | ICD-10-CM | POA: Diagnosis not present

## 2019-08-15 DIAGNOSIS — Z79899 Other long term (current) drug therapy: Secondary | ICD-10-CM | POA: Diagnosis not present

## 2019-09-13 DIAGNOSIS — L989 Disorder of the skin and subcutaneous tissue, unspecified: Secondary | ICD-10-CM | POA: Diagnosis not present

## 2019-09-13 DIAGNOSIS — G8929 Other chronic pain: Secondary | ICD-10-CM | POA: Diagnosis not present

## 2019-09-13 DIAGNOSIS — Z79899 Other long term (current) drug therapy: Secondary | ICD-10-CM | POA: Diagnosis not present

## 2019-09-13 DIAGNOSIS — F17210 Nicotine dependence, cigarettes, uncomplicated: Secondary | ICD-10-CM | POA: Diagnosis not present

## 2019-09-13 DIAGNOSIS — M545 Low back pain: Secondary | ICD-10-CM | POA: Diagnosis not present

## 2019-09-13 DIAGNOSIS — Z9181 History of falling: Secondary | ICD-10-CM | POA: Diagnosis not present

## 2019-09-20 ENCOUNTER — Ambulatory Visit (INDEPENDENT_AMBULATORY_CARE_PROVIDER_SITE_OTHER): Payer: Medicare Other | Admitting: Sports Medicine

## 2019-09-20 ENCOUNTER — Encounter: Payer: Self-pay | Admitting: Sports Medicine

## 2019-09-20 ENCOUNTER — Other Ambulatory Visit: Payer: Self-pay

## 2019-09-20 DIAGNOSIS — M79676 Pain in unspecified toe(s): Secondary | ICD-10-CM | POA: Diagnosis not present

## 2019-09-20 DIAGNOSIS — E119 Type 2 diabetes mellitus without complications: Secondary | ICD-10-CM | POA: Diagnosis not present

## 2019-09-20 DIAGNOSIS — B351 Tinea unguium: Secondary | ICD-10-CM | POA: Diagnosis not present

## 2019-09-20 NOTE — Progress Notes (Signed)
Subjective: David Kelly is a 74 y.o. male patient with history of diabetes who returns to office today complaining of long, painful nails while ambulating in shoes; unable to trim. Denies any other pedal complaints.   Patient states that the glucose reading today was 133 mg/dl. Last A1c unknown.  Saw primary care Dr. Cathlean Cower x 6 months ago   Patient Active Problem List   Diagnosis Date Noted  . Abscess of finger 09/06/2018  . MI (myocardial infarction) ([place]) 12/11/2017  . History of left knee replacement 12/11/2017  . HLD (hyperlipidemia) 09/28/2017  . Cough 03/31/2017  . Primary osteoarthritis of right knee 03/15/2017  . Unspecified injury of lower back, sequela 03/15/2017  . Bilateral rotator cuff syndrome 03/15/2017  . Recurrent falls while walking 12/13/2016  . Preventative health care 12/13/2016  . Chronic pain 12/13/2016  . Kidney stones   . Glaucoma   . Gastrointestinal bleed 06/28/2016  . Dysuria 10/28/2015  . Itching of ear 08/13/2015  . Type 2 diabetes mellitus ([place]) 07/15/2015  . Osteoarthritis 07/15/2015   Current Outpatient Medications on File Prior to Visit  Medication Sig Dispense Refill  . aspirin EC 81 MG tablet Take 81 mg by mouth at bedtime.    Marland Kitchen atorvastatin (LIPITOR) 40 MG tablet Take 1 tablet (40 mg total) by mouth daily. 90 tablet 3  . brimonidine-timolol (COMBIGAN) 0.2-0.5 % ophthalmic solution Place 1 drop into both eyes every 12 (twelve) hours. 15 mL 1  . diclofenac sodium (VOLTAREN) 1 % GEL Apply 1 application topically 3 (three) times daily. Right knee and both shoulders. 3 Tube 4  . Fluocinolone Acetonide 0.01 % OIL Place 5 drops in ear(s) 2 (two) times daily. 20 mL 1  . fluticasone (FLONASE) 50 MCG/ACT nasal spray Place 2 sprays into both nostrils daily. 16 g 6  . glipiZIDE (GLUCOTROL) 10 MG tablet Take 1 tablet (10 mg total) by mouth daily before breakfast. 90 tablet 3  . glucose blood (IGLUCOSE TEST STRIPS) test strip Use as instructed  100 each 12  . HYDROcodone-acetaminophen (NORCO) 10-325 MG tablet Take 1 tablet by mouth every 8 (eight) hours as needed. 90 tablet 0  . levocetirizine (XYZAL) 5 MG tablet     . metFORMIN (GLUCOPHAGE) 1000 MG tablet Take 1 tablet (1,000 mg total) by mouth 2 (two) times daily with a meal. 180 tablet 3  . montelukast (SINGULAIR) 10 MG tablet Take 1 tablet (10 mg total) by mouth at bedtime. 90 tablet 3  . Multiple Vitamin (MULTIVITAMIN WITH MINERALS) TABS tablet Take 1 tablet by mouth daily.    . naproxen (NAPROSYN) 500 MG tablet Take by mouth.    . Promethazine-Codeine 6.25-10 MG/5ML SOLN TAKE 1 TEASPOONFUL BY MOUTH EVERY 4 HOURS AS NEEDED FOR COUGH 180 mL 0  . psyllium (METAMUCIL) 58.6 % packet Take 1 packet by mouth daily.    . tamsulosin (FLOMAX) 0.4 MG CAPS capsule Take 1 capsule (0.4 mg total) by mouth at bedtime. 90 capsule 3  . triamcinolone ointment (KENALOG) 0.5 % APPLY TO AFFECTED AREA(S) TWICE DAILY 30 g 2  . TRUEplus Lancets 28G MISC 28 g by Other route as directed. 100 each 3  . vitamin B-12 (CYANOCOBALAMIN) 1000 MCG tablet Take 1 tablet (1,000 mcg total) by mouth daily. 90 tablet 1   No current facility-administered medications on file prior to visit.   No Known Allergies  No results found for this or any previous visit (from the past 2160 hour(s)).  Objective: General: Patient is  awake, alert, and oriented x 3 and in no acute distress. No acute changes with physical exam  Integument: Skin is warm, dry and supple bilateral. Nails are tender, mildly elongated, thickened and dystrophic with subungual debris, consistent with onychomycosis, 1-5 bilateral. No signs of infection. No open lesions or preulcerative lesions present bilateral.  Remaining integument unremarkable.  Vasculature:  Dorsalis Pedis pulse 2/4 bilateral. Posterior Tibial pulse 1 /4 bilateral. Capillary fill time <3 sec 1-5 bilateral. Positive hair growth to the level of the digits.Temperature gradient within normal  limits. No varicosities present bilateral. No edema present bilateral.   Neurology: The patient has intact sensation measured with a 5.07/10g Semmes Weinstein Monofilament at all pedal sites bilateral . Vibratory sensation diminished bilateral with tuning fork. No Babinski sign present bilateral.   Musculoskeletal: Asymptomatic bunion and hammertoe and planus pedal deformities noted bilateral. Muscular strength 5/5 in all lower extremity muscular groups bilateral without pain on range of motion. No tenderness with calf compression bilateral.  Assessment and Plan: Problem List Items Addressed This Visit    None    Visit Diagnoses    Pain due to onychomycosis of toenail    -  Primary   Diabetes mellitus without complication ([place])         -Examined patient. -Re-Discussed and educated patient on diabetic foot care and the importance of daily inspection of feet in the setting of diabetes -Mechanically debrided all nails 1-5 bilateral using sterile nail nipper and filed with dremel without incident -Patient to return  in 3 months for at risk foot care or sooner if there are any problems or issues  Landis Martins, DPM

## 2019-10-18 ENCOUNTER — Ambulatory Visit: Payer: Self-pay

## 2019-10-23 DIAGNOSIS — G8929 Other chronic pain: Secondary | ICD-10-CM | POA: Diagnosis not present

## 2019-10-23 DIAGNOSIS — Z79899 Other long term (current) drug therapy: Secondary | ICD-10-CM | POA: Diagnosis not present

## 2019-10-23 DIAGNOSIS — E119 Type 2 diabetes mellitus without complications: Secondary | ICD-10-CM | POA: Diagnosis not present

## 2019-10-23 DIAGNOSIS — M159 Polyosteoarthritis, unspecified: Secondary | ICD-10-CM | POA: Diagnosis not present

## 2019-10-23 DIAGNOSIS — M545 Low back pain: Secondary | ICD-10-CM | POA: Diagnosis not present

## 2019-10-24 ENCOUNTER — Ambulatory Visit (INDEPENDENT_AMBULATORY_CARE_PROVIDER_SITE_OTHER): Payer: Medicare Other

## 2019-10-24 ENCOUNTER — Other Ambulatory Visit: Payer: Self-pay

## 2019-10-24 VITALS — BP 120/80 | HR 76 | Temp 98.2°F | Resp 16 | Ht 72.0 in | Wt 208.0 lb

## 2019-10-24 DIAGNOSIS — Z0000 Encounter for general adult medical examination without abnormal findings: Secondary | ICD-10-CM

## 2019-10-24 NOTE — Progress Notes (Signed)
Subjective:   David Kelly is a 74 y.o. male who presents for Medicare Annual/Subsequent preventive examination.  Review of Systems    No ROS. Medicare Wellness Visit Cardiac Risk Factors include: advanced age (>48men, >30 women);diabetes mellitus;dyslipidemia;family history of premature cardiovascular disease;hypertension;male gender     Objective:    Today's Vitals   10/24/19 1308 10/24/19 1314  BP: 120/80   Pulse: 76   Resp: 16   Temp: 98.2 F (36.8 C)   SpO2: 97%   Weight: 208 lb (94.3 kg)   Height: 6' (1.829 m)   PainSc: 9  9    Body mass index is 28.21 kg/m.  Advanced Directives 10/24/2019 10/15/2018 09/28/2017 05/20/2016 05/17/2016 04/04/2011  Does Patient Have a Medical Advance Directive? No No No No No Patient does not have advance directive  Would patient like information on creating a medical advance directive? No - Patient declined Yes (ED - Information included in AVS) Yes (ED - Information included in AVS) No - Patient declined - -    Current Medications (verified) Outpatient Encounter Medications as of 10/24/2019  Medication Sig  . aspirin EC 81 MG tablet Take 81 mg by mouth at bedtime.  Marland Kitchen atorvastatin (LIPITOR) 40 MG tablet Take 1 tablet (40 mg total) by mouth daily.  . brimonidine-timolol (COMBIGAN) 0.2-0.5 % ophthalmic solution Place 1 drop into both eyes every 12 (twelve) hours.  . diclofenac sodium (VOLTAREN) 1 % GEL Apply 1 application topically 3 (three) times daily. Right knee and both shoulders.  . Fluocinolone Acetonide 0.01 % OIL Place 5 drops in ear(s) 2 (two) times daily.  . fluticasone (FLONASE) 50 MCG/ACT nasal spray Place 2 sprays into both nostrils daily.  Marland Kitchen glipiZIDE (GLUCOTROL) 10 MG tablet Take 1 tablet (10 mg total) by mouth daily before breakfast.  . glucose blood (IGLUCOSE TEST STRIPS) test strip Use as instructed  . HYDROcodone-acetaminophen (NORCO) 10-325 MG tablet Take 1 tablet by mouth every 8 (eight) hours as needed.  Marland Kitchen  levocetirizine (XYZAL) 5 MG tablet   . metFORMIN (GLUCOPHAGE) 1000 MG tablet Take 1 tablet (1,000 mg total) by mouth 2 (two) times daily with a meal.  . montelukast (SINGULAIR) 10 MG tablet Take 1 tablet (10 mg total) by mouth at bedtime.  . Multiple Vitamin (MULTIVITAMIN WITH MINERALS) TABS tablet Take 1 tablet by mouth daily.  . naproxen (NAPROSYN) 500 MG tablet Take by mouth.  . Promethazine-Codeine 6.25-10 MG/5ML SOLN TAKE 1 TEASPOONFUL BY MOUTH EVERY 4 HOURS AS NEEDED FOR COUGH  . psyllium (METAMUCIL) 58.6 % packet Take 1 packet by mouth daily.  . tamsulosin (FLOMAX) 0.4 MG CAPS capsule Take 1 capsule (0.4 mg total) by mouth at bedtime.  . triamcinolone ointment (KENALOG) 0.5 % APPLY TO AFFECTED AREA(S) TWICE DAILY  . TRUEplus Lancets 28G MISC 28 g by Other route as directed.  . vitamin B-12 (CYANOCOBALAMIN) 1000 MCG tablet Take 1 tablet (1,000 mcg total) by mouth daily.   No facility-administered encounter medications on file as of 10/24/2019.    Allergies (verified) Patient has no known allergies.   History: Past Medical History:  Diagnosis Date  . Allergy   . Arthritis   . Blood transfusion without reported diagnosis    with bleeding colon per pt in the past   . BPH (benign prostatic hypertrophy)   . Diabetes mellitus    DX  5 YR AGO  . Glaucoma   . H/O: GI bleed   . HLD (hyperlipidemia) 09/28/2017  . Kidney stones   .  Myocardial infarction [facility])    in the 1990's   Past Surgical History:  Procedure Laterality Date  . COLONOSCOPY    . CYSTOSCOPY WITH RETROGRADE PYELOGRAM, URETEROSCOPY AND STENT PLACEMENT Right 05/20/2016   Procedure: CYSTOSCOPY, URETEROSCOPY, TRANSURETHRAL INCISION OF URETHROCELE, BASKET STONE REMOVAL;  Surgeon: Irine Seal, MD;  Location: [facility];  Service: Urology;  Laterality: Right;  . Gall Bladder STones removed    . left total knee replacement    . TOTAL KNEE ARTHROPLASTY  04/04/2011   Procedure: TOTAL KNEE ARTHROPLASTY;  Surgeon: Rudean Haskell, MD;   Location: [facility];  Service: Orthopedics;  Laterality: Left;   Family History  Problem Relation Age of Onset  . Arthritis Mother   . Asthma Paternal Grandfather   . Diabetes Sister   . Colon polyps Brother   . Colon cancer Neg Hx   . Esophageal cancer Neg Hx   . Rectal cancer Neg Hx   . Stomach cancer Neg Hx    Social History   Socioeconomic History  . Marital status: Single    Spouse name: Not on file  . Number of children: 4  . Years of education: 12  . Highest education level: Not on file  Occupational History  . Occupation: retired  Tobacco Use  . Smoking status: Former Smoker    Packs/day: 1.50    Years: 45.00    Pack years: 67.50    Types: Cigarettes    Quit date: 03/21/2006    Years since quitting: 13.6  . Smokeless tobacco: Former Systems developer    Types: [place] date: 09/14/2018  Vaping Use  . Vaping Use: Never used  Substance and Sexual Activity  . Alcohol use: No  . Drug use: No  . Sexual activity: Not Currently  Other Topics Concern  . Not on file  Social History Narrative   Fun: Fish and go to the park   Social Determinants of Health   Financial Resource Strain: High Risk  . Difficulty of Paying Living Expenses: Hard  Food Insecurity: Food Insecurity Present  . Worried About Charity fundraiser in the Last Year: Often true  . Ran Out of Food in the Last Year: Often true  Transportation Needs: No Transportation Needs  . Lack of Transportation (Medical): No  . Lack of Transportation (Non-Medical): No  Physical Activity: Insufficiently Active  . Days of Exercise per Week: 3 days  . Minutes of Exercise per Session: 20 min  Stress: No Stress Concern Present  . Feeling of Stress : Not at all  Social Connections: Unknown  . Frequency of Communication with Friends and Family: More than three times a week  . Frequency of Social Gatherings with Friends and Family: Once a week  . Attends Religious Services: Patient refused  . Active Member of Clubs or  Organizations: Patient refused  . Attends Archivist Meetings: Patient refused  . Marital Status: Never married    Tobacco Counseling Counseling given: Not Answered   Clinical Intake:  Pre-visit preparation completed: Yes  Pain : 0-10 Pain Score: 9  Pain Type: Chronic pain Pain Location: Back Pain Orientation: Lower Pain Radiating Towards: right hip to right knee Pain Descriptors / Indicators: Aching, Throbbing Pain Onset: More than a month ago Pain Frequency: Constant Pain Relieving Factors: Pain medication, tylenol extra-strength  Pain Relieving Factors: Pain medication, tylenol extra-strength  BMI - recorded: 28.21 Nutritional Status: BMI 25 -29 Overweight Nutritional Risks: None Diabetes: Yes CBG done?: No Did pt.  bring in CBG monitor from home?: No  How often do you need to have someone help you when you read instructions, pamphlets, or other written materials from your doctor or pharmacy?: 1 - Never What is the last grade level you completed in school?: 11th grade  Diabetic? yes  Interpreter Needed?: No  Information entered by :: Lisette Abu, LPN   Activities of Daily Living In your present state of health, do you have any difficulty performing the following activities: 10/24/2019  Hearing? N  Vision? N  Difficulty concentrating or making decisions? N  Walking or climbing stairs? N  Dressing or bathing? N  Doing errands, shopping? N  Preparing Food and eating ? N  Using the Toilet? N  In the past six months, have you accidently leaked urine? N  Do you have problems with loss of bowel control? N  Managing your Medications? N  Managing your Finances? N  Housekeeping or managing your Housekeeping? N  Some recent data might be hidden    Patient Care Team: Biagio Borg, MD as PCP - General (Internal Medicine) Landis Martins, DPM as Consulting Physician (Podiatry) Irine Seal, MD as Attending Physician (Urology)  Indicate any recent  Medical Services you may have received from other than [facility] providers in the past year (date may be approximate).     Assessment:   This is a routine wellness examination for [employer].  Hearing/Vision screen No exam data present  Dietary issues and exercise activities discussed: Current Exercise Habits: The patient does not participate in regular exercise at present, Time (Minutes): 30 (walk around in the yard), Frequency (Times/Week): 3, Weekly Exercise (Minutes/Week): 90, Intensity: Mild, Exercise limited by: orthopedic condition(s);Other - see comments (back pain radiating to right lower extremity)  Goals    . Patient Stated     Stay as healthy and as independent as possible. Continue to exercise, eat healthy, and enjoy life and church.      Depression Screen PHQ 2/9 Scores 10/24/2019 03/11/2019 10/15/2018 09/06/2018 09/28/2017 03/31/2017 12/13/2016  PHQ - 2 Score 0 0 1 0 0 0 3  PHQ- 9 Score - - - - - - 8    Fall Risk Fall Risk  10/24/2019 03/11/2019 10/15/2018 09/06/2018 09/28/2017  Falls in the past year? 0 1 1 0 Yes  Comment - - - - -  Number falls in past yr: 0 0 0 - 1  Injury with Fall? 0 0 0 - No  Risk for fall due to : No Fall Risks - Impaired balance/gait - -  Follow up Falls evaluation completed;Education provided - Falls prevention discussed - -    Any stairs in or around the home? No  If so, are there any without handrails? No  Home free of loose throw rugs in walkways, pet beds, electrical cords, etc? Yes  Adequate lighting in your home to reduce risk of falls? Yes   ASSISTIVE DEVICES UTILIZED TO PREVENT FALLS:  Life alert? No  Use of a cane, walker or w/c? No  Grab bars in the bathroom? No  Shower chair or bench in shower? No  Elevated toilet seat or a handicapped toilet? No   TIMED UP AND GO:  Was the test performed? No .  Length of time to ambulate 10 feet: 0 sec.   Gait steady and fast without use of assistive device  Cognitive Function: MMSE - Mini Mental  State Exam 09/28/2017  Orientation to time 5  Orientation to Place 5  Registration 3  Attention/ Calculation 3  Recall 1  Language- name 2 objects 2  Language- repeat 1  Language- follow 3 step command 3  Language- read & follow direction 1  Write a sentence 1  Copy design 1  Total score 26     6CIT Screen 10/24/2019 10/15/2018  What Year? 0 points 0 points  What month? 0 points 0 points  What time? 0 points 0 points  Count back from 20 0 points 0 points  Months in reverse 2 points 0 points  Repeat phrase 2 points 0 points  Total Score 4 0    Immunizations Immunization History  Administered Date(s) Administered  . Influenza, High Dose Seasonal PF 10/28/2015    TDAP status: Due, Education has been provided regarding the importance of this vaccine. Advised may receive this vaccine at local pharmacy or Health Dept. Aware to provide a copy of the vaccination record if obtained from local pharmacy or Health Dept. Verbalized acceptance and understanding. Flu Vaccine status: Declined, Education has been provided regarding the importance of this vaccine but patient still declined. Advised may receive this vaccine at local pharmacy or Health Dept. Aware to provide a copy of the vaccination record if obtained from local pharmacy or Health Dept. Verbalized acceptance and understanding. Pneumococcal vaccine status: Up to date Covid-19 vaccine status: Completed vaccines  (Per patient received at [facility]; will bring vaccination card to next visit).  Qualifies for Shingles Vaccine? Yes   Zostavax completed Yes   Shingrix Completed?: No.    Education has been provided regarding the importance of this vaccine. Patient has been advised to call insurance company to determine out of pocket expense if they have not yet received this vaccine. Advised may also receive vaccine at local pharmacy or Health Dept. Verbalized acceptance and understanding.  Screening Tests Health  Maintenance  Topic Date Due  . COVID-19 Vaccine (1) Never done  . FOOT EXAM  09/06/2019  . HEMOGLOBIN A1C  09/08/2019  . INFLUENZA VACCINE  09/15/2019  . TETANUS/TDAP  03/10/2020 (Originally 03/19/1964)  . PNA vac Low Risk Adult (1 of 2 - PCV13) 03/10/2020 (Originally 03/19/2010)  . OPHTHALMOLOGY EXAM  12/13/2019  . URINE MICROALBUMIN  03/10/2020  . COLONOSCOPY  02/18/2022  . Hepatitis C Screening  Completed    Health Maintenance  Health Maintenance Due  Topic Date Due  . COVID-19 Vaccine (1) Never done  . FOOT EXAM  09/06/2019  . HEMOGLOBIN A1C  09/08/2019  . INFLUENZA VACCINE  09/15/2019    Colorectal cancer screening: Completed 02/19/2019. Repeat every 3 years  Lung Cancer Screening: (Low Dose CT Chest recommended if Age 59-80 years, 30 pack-year currently smoking OR have quit w/in 15years.) does qualify.   Lung Cancer Screening Referral: no  Additional Screening:  Hepatitis C Screening: does qualify; Completed: yes  Vision Screening: Recommended annual ophthalmology exams for early detection of glaucoma and other disorders of the eye. Is the patient up to date with their annual eye exam?  Yes  Who is the provider or what is the name of the office in which the patient attends annual eye exams? [facility] If pt is not established with a provider, would they like to be referred to a provider to establish care? No .   Dental Screening: Recommended annual dental exams for proper oral hygiene  Community Resource Referral / Chronic Care Management: CRR required this visit?  No   CCM required this visit?  No      Plan:  I have personally reviewed and noted the following in the patient's chart:   . Medical and social history . Use of alcohol, tobacco or illicit drugs  . Current medications and supplements . Functional ability and status . Nutritional status . Physical activity . Advanced directives . List of other physicians . Hospitalizations, surgeries, and  ER visits in previous 12 months . Vitals . Screenings to include cognitive, depression, and falls . Referrals and appointments  In addition, I have reviewed and discussed with patient certain preventive protocols, quality metrics, and best practice recommendations. A written personalized care plan for preventive services as well as general preventive health recommendations were provided to patient.     Sheral Flow, LPN   07/23/4368   Nurse Notes: n/a

## 2019-10-24 NOTE — Patient Instructions (Signed)
David Kelly , Thank you for taking time to come for your Medicare Wellness Visit. I appreciate your ongoing commitment to your health goals. Please review the following plan we discussed and let me know if I can assist you in the future.   Screening recommendations/referrals: Colonoscopy: 02/19/2019; due every 3 years Recommended yearly ophthalmology/optometry visit for glaucoma screening and checkup Recommended yearly dental visit for hygiene and checkup  Vaccinations: Influenza vaccine: due Pneumococcal vaccine: up to date Tdap vaccine: never done Shingles vaccine: never done   Covid-19: never done  Advanced directives: Advance directive discussed with you today. Even though you declined this today please call our office should you change your mind and we can give you the proper paperwork for you to fill out.  Conditions/risks identified: Yes. Reviewed health maintenance screenings with patient today and relevant education, vaccines, and/or referrals were provided. Continue doing brain stimulating activities (puzzles, reading, adult coloring books, staying active) to keep memory sharp. Continue to eat heart healthy diet (full of fruits, vegetables, whole grains, lean protein, water--limit salt, fat, and sugar intake) and increase physical activity as tolerated.  Next appointment: Please schedule your next Medicare Wellness Visit with your Nurse Health Advisor in 1 year.   Preventive Care 74 Years and Older, Male Preventive care refers to lifestyle choices and visits with your health care provider that can promote health and wellness. What does preventive care include?  A yearly physical exam. This is also called an annual well check.  Dental exams once or twice a year.  Routine eye exams. Ask your health care provider how often you should have your eyes checked.  Personal lifestyle choices, including:  Daily care of your teeth and gums.  Regular physical activity.  Eating a  healthy diet.  Avoiding tobacco and drug use.  Limiting alcohol use.  Practicing safe sex.  Taking low doses of aspirin every day.  Taking vitamin and mineral supplements as recommended by your health care provider. What happens during an annual well check? The services and screenings done by your health care provider during your annual well check will depend on your age, overall health, lifestyle risk factors, and family history of disease. Counseling  Your health care provider may ask you questions about your:  Alcohol use.  Tobacco use.  Drug use.  Emotional well-being.  Home and relationship well-being.  Sexual activity.  Eating habits.  History of falls.  Memory and ability to understand (cognition).  Work and work Statistician. Screening  You may have the following tests or measurements:  Height, weight, and BMI.  Blood pressure.  Lipid and cholesterol levels. These may be checked every 5 years, or more frequently if you are over 60 years old.  Skin check.  Lung cancer screening. You may have this screening every year starting at age 66 if you have a 30-pack-year history of smoking and currently smoke or have quit within the past 15 years.  Fecal occult blood test (FOBT) of the stool. You may have this test every year starting at age 70.  Flexible sigmoidoscopy or colonoscopy. You may have a sigmoidoscopy every 5 years or a colonoscopy every 10 years starting at age 13.  Prostate cancer screening. Recommendations will vary depending on your family history and other risks.  Hepatitis C blood test.  Hepatitis B blood test.  Sexually transmitted disease (STD) testing.  Diabetes screening. This is done by checking your blood sugar (glucose) after you have not eaten for a while (fasting). You may  have this done every 1-3 years.  Abdominal aortic aneurysm (AAA) screening. You may need this if you are a current or former smoker.  Osteoporosis. You may be  screened starting at age 77 if you are at high risk. Talk with your health care provider about your test results, treatment options, and if necessary, the need for more tests. Vaccines  Your health care provider may recommend certain vaccines, such as:  Influenza vaccine. This is recommended every year.  Tetanus, diphtheria, and acellular pertussis (Tdap, Td) vaccine. You may need a Td booster every 10 years.  Zoster vaccine. You may need this after age 17.  Pneumococcal 13-valent conjugate (PCV13) vaccine. One dose is recommended after age 51.  Pneumococcal polysaccharide (PPSV23) vaccine. One dose is recommended after age 70. Talk to your health care provider about which screenings and vaccines you need and how often you need them. This information is not intended to replace advice given to you by your health care provider. Make sure you discuss any questions you have with your health care provider. Document Released: 02/27/2015 Document Revised: 10/21/2015 Document Reviewed: 12/02/2014 Elsevier Interactive Patient Education  2017 [place] Prevention in the Home Falls can cause injuries. They can happen to people of all ages. There are many things you can do to make your home safe and to help prevent falls. What can I do on the outside of my home?  Regularly fix the edges of walkways and driveways and fix any cracks.  Remove anything that might make you trip as you walk through a door, such as a raised step or threshold.  Trim any bushes or trees on the path to your home.  Use bright outdoor lighting.  Clear any walking paths of anything that might make someone trip, such as rocks or tools.  Regularly check to see if handrails are loose or broken. Make sure that both sides of any steps have handrails.  Any raised decks and porches should have guardrails on the edges.  Have any leaves, snow, or ice cleared regularly.  Use sand or salt on walking paths during  winter.  Clean up any spills in your garage right away. This includes oil or grease spills. What can I do in the bathroom?  Use night lights.  Install grab bars by the toilet and in the tub and shower. Do not use towel bars as grab bars.  Use non-skid mats or decals in the tub or shower.  If you need to sit down in the shower, use a plastic, non-slip stool.  Keep the floor dry. Clean up any water that spills on the floor as soon as it happens.  Remove soap buildup in the tub or shower regularly.  Attach bath mats securely with double-sided non-slip rug tape.  Do not have throw rugs and other things on the floor that can make you trip. What can I do in the bedroom?  Use night lights.  Make sure that you have a light by your bed that is easy to reach.  Do not use any sheets or blankets that are too big for your bed. They should not hang down onto the floor.  Have a firm chair that has side arms. You can use this for support while you get dressed.  Do not have throw rugs and other things on the floor that can make you trip. What can I do in the kitchen?  Clean up any spills right away.  Avoid walking on wet  floors.  Keep items that you use a lot in easy-to-reach places.  If you need to reach something above you, use a strong step stool that has a grab bar.  Keep electrical cords out of the way.  Do not use floor polish or wax that makes floors slippery. If you must use wax, use non-skid floor wax.  Do not have throw rugs and other things on the floor that can make you trip. What can I do with my stairs?  Do not leave any items on the stairs.  Make sure that there are handrails on both sides of the stairs and use them. Fix handrails that are broken or loose. Make sure that handrails are as long as the stairways.  Check any carpeting to make sure that it is firmly attached to the stairs. Fix any carpet that is loose or worn.  Avoid having throw rugs at the top or  bottom of the stairs. If you do have throw rugs, attach them to the floor with carpet tape.  Make sure that you have a light switch at the top of the stairs and the bottom of the stairs. If you do not have them, ask someone to add them for you. What else can I do to help prevent falls?  Wear shoes that:  Do not have high heels.  Have rubber bottoms.  Are comfortable and fit you well.  Are closed at the toe. Do not wear sandals.  If you use a stepladder:  Make sure that it is fully opened. Do not climb a closed stepladder.  Make sure that both sides of the stepladder are locked into place.  Ask someone to hold it for you, if possible.  Clearly mark and make sure that you can see:  Any grab bars or handrails.  First and last steps.  Where the edge of each step is.  Use tools that help you move around (mobility aids) if they are needed. These include:  Canes.  Walkers.  Scooters.  Crutches.  Turn on the lights when you go into a dark area. Replace any light bulbs as soon as they burn out.  Set up your furniture so you have a clear path. Avoid moving your furniture around.  If any of your floors are uneven, fix them.  If there are any pets around you, be aware of where they are.  Review your medicines with your doctor. Some medicines can make you feel dizzy. This can increase your chance of falling. Ask your doctor what other things that you can do to help prevent falls. This information is not intended to replace advice given to you by your health care provider. Make sure you discuss any questions you have with your health care provider. Document Released: 11/27/2008 Document Revised: 07/09/2015 Document Reviewed: 03/07/2014 Elsevier Interactive Patient Education  2017 [employer].

## 2019-11-22 DIAGNOSIS — M545 Low back pain, unspecified: Secondary | ICD-10-CM | POA: Diagnosis not present

## 2019-11-22 DIAGNOSIS — E1165 Type 2 diabetes mellitus with hyperglycemia: Secondary | ICD-10-CM | POA: Diagnosis not present

## 2019-11-22 DIAGNOSIS — Z79899 Other long term (current) drug therapy: Secondary | ICD-10-CM | POA: Diagnosis not present

## 2019-11-22 DIAGNOSIS — Z23 Encounter for immunization: Secondary | ICD-10-CM | POA: Diagnosis not present

## 2019-11-22 DIAGNOSIS — G8929 Other chronic pain: Secondary | ICD-10-CM | POA: Diagnosis not present

## 2019-11-23 ENCOUNTER — Other Ambulatory Visit: Payer: Self-pay | Admitting: Internal Medicine

## 2019-12-09 ENCOUNTER — Other Ambulatory Visit: Payer: Self-pay | Admitting: Internal Medicine

## 2019-12-09 DIAGNOSIS — E119 Type 2 diabetes mellitus without complications: Secondary | ICD-10-CM

## 2019-12-18 DIAGNOSIS — Z79899 Other long term (current) drug therapy: Secondary | ICD-10-CM | POA: Diagnosis not present

## 2019-12-18 DIAGNOSIS — G8929 Other chronic pain: Secondary | ICD-10-CM | POA: Diagnosis not present

## 2019-12-18 DIAGNOSIS — M1711 Unilateral primary osteoarthritis, right knee: Secondary | ICD-10-CM | POA: Diagnosis not present

## 2019-12-18 DIAGNOSIS — M545 Low back pain, unspecified: Secondary | ICD-10-CM | POA: Diagnosis not present

## 2019-12-24 ENCOUNTER — Encounter: Payer: Self-pay | Admitting: Sports Medicine

## 2019-12-24 ENCOUNTER — Other Ambulatory Visit: Payer: Self-pay

## 2019-12-24 ENCOUNTER — Ambulatory Visit (INDEPENDENT_AMBULATORY_CARE_PROVIDER_SITE_OTHER): Payer: Medicare Other | Admitting: Sports Medicine

## 2019-12-24 DIAGNOSIS — M79676 Pain in unspecified toe(s): Secondary | ICD-10-CM | POA: Diagnosis not present

## 2019-12-24 DIAGNOSIS — B351 Tinea unguium: Secondary | ICD-10-CM | POA: Diagnosis not present

## 2019-12-24 DIAGNOSIS — E119 Type 2 diabetes mellitus without complications: Secondary | ICD-10-CM | POA: Diagnosis not present

## 2019-12-24 NOTE — Progress Notes (Signed)
Subjective: David Kelly is a 74 y.o. male patient with history of diabetes who returns to office today complaining of long, painful nails while ambulating in shoes; unable to trim. Denies any other pedal complaints.   Patient states that the glucose reading today was 140.   Patient Active Problem List   Diagnosis Date Noted   Abscess of finger 09/06/2018   MI (myocardial infarction) ([place]) 12/11/2017   History of left knee replacement 12/11/2017   HLD (hyperlipidemia) 09/28/2017   Cough 03/31/2017   Primary osteoarthritis of right knee 03/15/2017   Unspecified injury of lower back, sequela 03/15/2017   Bilateral rotator cuff syndrome 03/15/2017   Recurrent falls while walking 12/13/2016   Preventative health care 12/13/2016   Chronic pain 12/13/2016   Kidney stones    Glaucoma    Gastrointestinal bleed 06/28/2016   Dysuria 10/28/2015   Itching of ear 08/13/2015   Type 2 diabetes mellitus ([place]) 07/15/2015   Osteoarthritis 07/15/2015   Current Outpatient Medications on File Prior to Visit  Medication Sig Dispense Refill   aspirin EC 81 MG tablet Take 81 mg by mouth at bedtime.     atorvastatin (LIPITOR) 40 MG tablet Take 1 tablet (40 mg total) by mouth daily. 90 tablet 3   brimonidine-timolol (COMBIGAN) 0.2-0.5 % ophthalmic solution Place 1 drop into both eyes every 12 (twelve) hours. 15 mL 1   diclofenac sodium (VOLTAREN) 1 % GEL Apply 1 application topically 3 (three) times daily. Right knee and both shoulders. 3 Tube 4   Fluocinolone Acetonide 0.01 % OIL Place 5 drops in ear(s) 2 (two) times daily. 20 mL 1   fluticasone (FLONASE) 50 MCG/ACT nasal spray Place 2 sprays into both nostrils daily. 16 g 6   glipiZIDE (GLUCOTROL) 10 MG tablet Take 1 tablet (10 mg total) by mouth daily before breakfast. 90 tablet 3   glucose blood (IGLUCOSE TEST STRIPS) test strip Use as instructed 100 each 12   HYDROcodone-acetaminophen (NORCO) 10-325 MG tablet Take 1  tablet by mouth every 8 (eight) hours as needed. 90 tablet 0   levocetirizine (XYZAL) 5 MG tablet      metFORMIN (GLUCOPHAGE) 1000 MG tablet TAKE 1 TABLET BY MOUTH 2 TIMES DAILY WITH A MEAL 180 tablet 3   montelukast (SINGULAIR) 10 MG tablet Take 1 tablet (10 mg total) by mouth at bedtime. 90 tablet 3   Multiple Vitamin (MULTIVITAMIN WITH MINERALS) TABS tablet Take 1 tablet by mouth daily.     naproxen (NAPROSYN) 500 MG tablet Take by mouth.     Promethazine-Codeine 6.25-10 MG/5ML SOLN TAKE 1 TEASPOONFUL BY MOUTH EVERY 4 HOURS AS NEEDED FOR COUGH 180 mL 0   psyllium (METAMUCIL) 58.6 % packet Take 1 packet by mouth daily.     tamsulosin (FLOMAX) 0.4 MG CAPS capsule Take 1 capsule (0.4 mg total) by mouth at bedtime. 90 capsule 3   triamcinolone ointment (KENALOG) 0.5 % APPLY TO AFFECTED AREA(S) TWICE DAILY 30 g 2   TRUEplus Lancets 28G MISC 28 g by Other route as directed. 100 each 3   vitamin B-12 (CYANOCOBALAMIN) 1000 MCG tablet Take 1 tablet (1,000 mcg total) by mouth daily. 90 tablet 1   No current facility-administered medications on file prior to visit.   No Known Allergies  No results found for this or any previous visit (from the past 2160 hour(s)).  Objective: General: Patient is awake, alert, and oriented x 3 and in no acute distress. No acute changes with physical exam  Integument:  Skin is warm, dry and supple bilateral. Nails are tender, mildly elongated, thickened and dystrophic with subungual debris, consistent with onychomycosis, 1-5 bilateral. No signs of infection. No open lesions or preulcerative lesions present bilateral.  Remaining integument unremarkable.  Vasculature:  Dorsalis Pedis pulse 2/4 bilateral. Posterior Tibial pulse 1 /4 bilateral. Capillary fill time <3 sec 1-5 bilateral. Positive hair growth to the level of the digits.Temperature gradient within normal limits. No varicosities present bilateral. No edema present bilateral.   Neurology: The patient  has intact sensation measured with a 5.07/10g Semmes Weinstein Monofilament at all pedal sites bilateral . Vibratory sensation diminished bilateral with tuning fork. No Babinski sign present bilateral.   Musculoskeletal: Asymptomatic bunion and hammertoe and planus pedal deformities noted bilateral. Muscular strength 5/5 in all lower extremity muscular groups bilateral without pain on range of motion. No tenderness with calf compression bilateral.  Assessment and Plan: Problem List Items Addressed This Visit    None    Visit Diagnoses    Pain due to onychomycosis of toenail    -  Primary   Diabetes mellitus without complication ([place])         -Examined patient. -Re-Discussed and educated patient on diabetic foot care and the importance of daily inspection of feet in the setting of diabetes -Mechanically debrided all nails 1-5 bilateral using sterile nail nipper and filed with dremel without incident -Patient to return  in 3 months for at risk foot care or sooner if there are any problems or issues  Landis Martins, DPM

## 2020-01-13 ENCOUNTER — Encounter: Payer: Self-pay | Admitting: Internal Medicine

## 2020-01-13 DIAGNOSIS — E119 Type 2 diabetes mellitus without complications: Secondary | ICD-10-CM | POA: Diagnosis not present

## 2020-01-13 DIAGNOSIS — H25813 Combined forms of age-related cataract, bilateral: Secondary | ICD-10-CM | POA: Diagnosis not present

## 2020-01-13 LAB — HM DIABETES EYE EXAM

## 2020-01-17 DIAGNOSIS — Z79899 Other long term (current) drug therapy: Secondary | ICD-10-CM | POA: Diagnosis not present

## 2020-01-17 DIAGNOSIS — M545 Low back pain, unspecified: Secondary | ICD-10-CM | POA: Diagnosis not present

## 2020-01-17 DIAGNOSIS — G8929 Other chronic pain: Secondary | ICD-10-CM | POA: Diagnosis not present

## 2020-02-19 ENCOUNTER — Other Ambulatory Visit: Payer: Self-pay | Admitting: Internal Medicine

## 2020-02-19 DIAGNOSIS — Z79891 Long term (current) use of opiate analgesic: Secondary | ICD-10-CM | POA: Diagnosis not present

## 2020-02-19 DIAGNOSIS — M545 Low back pain, unspecified: Secondary | ICD-10-CM | POA: Diagnosis not present

## 2020-02-19 DIAGNOSIS — G8929 Other chronic pain: Secondary | ICD-10-CM | POA: Diagnosis not present

## 2020-02-19 DIAGNOSIS — Z79899 Other long term (current) drug therapy: Secondary | ICD-10-CM | POA: Diagnosis not present

## 2020-03-11 ENCOUNTER — Other Ambulatory Visit: Payer: Self-pay | Admitting: Internal Medicine

## 2020-03-18 DIAGNOSIS — G8929 Other chronic pain: Secondary | ICD-10-CM | POA: Diagnosis not present

## 2020-03-18 DIAGNOSIS — Z79899 Other long term (current) drug therapy: Secondary | ICD-10-CM | POA: Diagnosis not present

## 2020-03-18 DIAGNOSIS — E1165 Type 2 diabetes mellitus with hyperglycemia: Secondary | ICD-10-CM | POA: Diagnosis not present

## 2020-03-18 DIAGNOSIS — M545 Low back pain, unspecified: Secondary | ICD-10-CM | POA: Diagnosis not present

## 2020-03-18 DIAGNOSIS — K047 Periapical abscess without sinus: Secondary | ICD-10-CM | POA: Diagnosis not present

## 2020-03-25 ENCOUNTER — Encounter: Payer: Self-pay | Admitting: Sports Medicine

## 2020-03-25 ENCOUNTER — Ambulatory Visit (INDEPENDENT_AMBULATORY_CARE_PROVIDER_SITE_OTHER): Payer: Medicare Other | Admitting: Sports Medicine

## 2020-03-25 ENCOUNTER — Other Ambulatory Visit: Payer: Self-pay

## 2020-03-25 DIAGNOSIS — B351 Tinea unguium: Secondary | ICD-10-CM

## 2020-03-25 DIAGNOSIS — E119 Type 2 diabetes mellitus without complications: Secondary | ICD-10-CM | POA: Diagnosis not present

## 2020-03-25 DIAGNOSIS — M79676 Pain in unspecified toe(s): Secondary | ICD-10-CM

## 2020-03-25 NOTE — Progress Notes (Signed)
Subjective: David Kelly is a 75 y.o. male patient with history of diabetes who returns to office today complaining of long, painful nails while ambulating in shoes; unable to trim. Denies any other pedal complaints.   Patient states that the glucose reading today was 121, A1C unknown, PCP Dr. [name] x 6 months ago  Patient Active Problem List   Diagnosis Date Noted  . Abscess of finger 09/06/2018  . MI (myocardial infarction) ([place]) 12/11/2017  . History of left knee replacement 12/11/2017  . HLD (hyperlipidemia) 09/28/2017  . Cough 03/31/2017  . Primary osteoarthritis of right knee 03/15/2017  . Unspecified injury of lower back, sequela 03/15/2017  . Bilateral rotator cuff syndrome 03/15/2017  . Recurrent falls while walking 12/13/2016  . Preventative health care 12/13/2016  . Chronic pain 12/13/2016  . Kidney stones   . Glaucoma   . Gastrointestinal bleed 06/28/2016  . Dysuria 10/28/2015  . Itching of ear 08/13/2015  . Type 2 diabetes mellitus ([place]) 07/15/2015  . Osteoarthritis 07/15/2015   Current Outpatient Medications on File Prior to Visit  Medication Sig Dispense Refill  . aspirin EC 81 MG tablet Take 81 mg by mouth at bedtime.    Marland Kitchen atorvastatin (LIPITOR) 40 MG tablet Take 1 tablet (40 mg total) by mouth daily. 90 tablet 3  . brimonidine-timolol (COMBIGAN) 0.2-0.5 % ophthalmic solution Place 1 drop into both eyes every 12 (twelve) hours. 15 mL 1  . diclofenac sodium (VOLTAREN) 1 % GEL Apply 1 application topically 3 (three) times daily. Right knee and both shoulders. 3 Tube 4  . Fluocinolone Acetonide 0.01 % OIL Place 5 drops in ear(s) 2 (two) times daily. 20 mL 1  . fluticasone (FLONASE) 50 MCG/ACT nasal spray Place 2 sprays into both nostrils daily. 16 g 6  . glipiZIDE (GLUCOTROL) 10 MG tablet Take 1 tablet (10 mg total) by mouth daily before breakfast. 90 tablet 3  . glucose blood (IGLUCOSE TEST STRIPS) test strip Use as instructed 100 each 12  .  HYDROcodone-acetaminophen (NORCO) 10-325 MG tablet Take 1 tablet by mouth every 8 (eight) hours as needed. 90 tablet 0  . levocetirizine (XYZAL) 5 MG tablet     . metFORMIN (GLUCOPHAGE) 1000 MG tablet TAKE 1 TABLET BY MOUTH 2 TIMES DAILY WITH A MEAL 180 tablet 3  . montelukast (SINGULAIR) 10 MG tablet Take 1 tablet (10 mg total) by mouth at bedtime. 90 tablet 3  . Multiple Vitamin (MULTIVITAMIN WITH MINERALS) TABS tablet Take 1 tablet by mouth daily.    . naproxen (NAPROSYN) 500 MG tablet Take by mouth.    . Promethazine-Codeine 6.25-10 MG/5ML SOLN TAKE 1 TEASPOONFUL BY MOUTH EVERY 4 HOURS AS NEEDED FOR COUGH 180 mL 0  . psyllium (METAMUCIL) 58.6 % packet Take 1 packet by mouth daily.    . tamsulosin (FLOMAX) 0.4 MG CAPS capsule Take 1 capsule (0.4 mg total) by mouth at bedtime. 90 capsule 3  . triamcinolone ointment (KENALOG) 0.5 % APPLY TO AFFECTED AREA(S) TWICE DAILY 30 g 2  . TRUEplus Lancets 28G MISC 28 g by Other route as directed. 100 each 3  . vitamin B-12 (CYANOCOBALAMIN) 1000 MCG tablet Take 1 tablet (1,000 mcg total) by mouth daily. 90 tablet 1   No current facility-administered medications on file prior to visit.   No Known Allergies  Recent Results (from the past 2160 hour(s))  HM DIABETES EYE EXAM     Status: None   Collection Time: 01/13/20  1:19 PM  Result Value Ref  Range   HM Diabetic Eye Exam No Retinopathy No Retinopathy    Objective: General: Patient is awake, alert, and oriented x 3 and in no acute distress. No acute changes with physical exam  Integument: Skin is warm, dry and supple bilateral. Nails are tender, mildly elongated, thickened and dystrophic with subungual debris, consistent with onychomycosis, 1-5 bilateral. No signs of infection. No open lesions or preulcerative lesions present bilateral.  Remaining integument unremarkable.  Vasculature:  Dorsalis Pedis pulse 2/4 bilateral. Posterior Tibial pulse 1 /4 bilateral. Capillary fill time <3 sec 1-5  bilateral. Positive hair growth to the level of the digits.Temperature gradient within normal limits. No varicosities present bilateral. No edema present bilateral.   Neurology: The patient has intact sensation measured with a 5.07/10g Semmes Weinstein Monofilament at all pedal sites bilateral . Vibratory sensation diminished bilateral with tuning fork. No Babinski sign present bilateral.   Musculoskeletal: Asymptomatic bunion and hammertoe and planus pedal deformities noted bilateral. Muscular strength 5/5 in all lower extremity muscular groups bilateral without pain on range of motion. No tenderness with calf compression bilateral.  Assessment and Plan: Problem List Items Addressed This Visit   None   Visit Diagnoses    Pain due to onychomycosis of toenail    -  Primary   Diabetes mellitus without complication ([place])         -Examined patient. -Re-Discussed and educated patient on diabetic foot care and the importance of daily inspection of feet in the setting of diabetes -Mechanically debrided all nails 1-5 bilateral using sterile nail nipper and filed with dremel without incident -Patient to return  in 3 months for at risk foot care or sooner if there are any problems or issues  Landis Martins, DPM

## 2020-04-15 ENCOUNTER — Other Ambulatory Visit: Payer: Self-pay | Admitting: Internal Medicine

## 2020-04-15 NOTE — Telephone Encounter (Signed)
lov jan 2021  glipzide refil done 1 mo only  Please ask pt to make rov for further refills

## 2020-04-17 NOTE — Telephone Encounter (Signed)
Pt notified via voicemail to return office call for an appt

## 2020-04-18 ENCOUNTER — Other Ambulatory Visit: Payer: Self-pay | Admitting: Internal Medicine

## 2020-04-29 ENCOUNTER — Encounter: Payer: Self-pay | Admitting: Internal Medicine

## 2020-04-29 ENCOUNTER — Ambulatory Visit (INDEPENDENT_AMBULATORY_CARE_PROVIDER_SITE_OTHER): Payer: Medicare Other | Admitting: Internal Medicine

## 2020-04-29 ENCOUNTER — Other Ambulatory Visit: Payer: Self-pay

## 2020-04-29 VITALS — BP 118/78 | HR 72 | Temp 98.6°F | Ht 72.0 in | Wt 208.0 lb

## 2020-04-29 DIAGNOSIS — N320 Bladder-neck obstruction: Secondary | ICD-10-CM | POA: Diagnosis not present

## 2020-04-29 DIAGNOSIS — E7800 Pure hypercholesterolemia, unspecified: Secondary | ICD-10-CM

## 2020-04-29 DIAGNOSIS — R053 Chronic cough: Secondary | ICD-10-CM

## 2020-04-29 DIAGNOSIS — E559 Vitamin D deficiency, unspecified: Secondary | ICD-10-CM

## 2020-04-29 DIAGNOSIS — E538 Deficiency of other specified B group vitamins: Secondary | ICD-10-CM

## 2020-04-29 DIAGNOSIS — E1165 Type 2 diabetes mellitus with hyperglycemia: Secondary | ICD-10-CM | POA: Diagnosis not present

## 2020-04-29 LAB — MICROALBUMIN / CREATININE URINE RATIO
Creatinine,U: 104.9 mg/dL
Microalb Creat Ratio: 2.5 mg/g (ref 0.0–30.0)
Microalb, Ur: 2.6 mg/dL — ABNORMAL HIGH (ref 0.0–1.9)

## 2020-04-29 LAB — CBC WITH DIFFERENTIAL/PLATELET
Basophils Absolute: 0 10*3/uL (ref 0.0–0.1)
Basophils Relative: 0.3 % (ref 0.0–3.0)
Eosinophils Absolute: 0.1 10*3/uL (ref 0.0–0.7)
Eosinophils Relative: 2 % (ref 0.0–5.0)
HCT: 37.3 % — ABNORMAL LOW (ref 39.0–52.0)
Hemoglobin: 12.2 g/dL — ABNORMAL LOW (ref 13.0–17.0)
Lymphocytes Relative: 22.4 % (ref 12.0–46.0)
Lymphs Abs: 1.6 10*3/uL (ref 0.7–4.0)
MCHC: 32.6 g/dL (ref 30.0–36.0)
MCV: 82.7 fl (ref 78.0–100.0)
Monocytes Absolute: 0.6 10*3/uL (ref 0.1–1.0)
Monocytes Relative: 7.7 % (ref 3.0–12.0)
Neutro Abs: 5 10*3/uL (ref 1.4–7.7)
Neutrophils Relative %: 67.6 % (ref 43.0–77.0)
Platelets: 294 10*3/uL (ref 150.0–400.0)
RBC: 4.51 Mil/uL (ref 4.22–5.81)
RDW: 14.3 % (ref 11.5–15.5)
WBC: 7.4 10*3/uL (ref 4.0–10.5)

## 2020-04-29 LAB — HEPATIC FUNCTION PANEL
ALT: 20 U/L (ref 0–53)
AST: 18 U/L (ref 0–37)
Albumin: 4.5 g/dL (ref 3.5–5.2)
Alkaline Phosphatase: 58 U/L (ref 39–117)
Bilirubin, Direct: 0.1 mg/dL (ref 0.0–0.3)
Total Bilirubin: 0.4 mg/dL (ref 0.2–1.2)
Total Protein: 7.5 g/dL (ref 6.0–8.3)

## 2020-04-29 LAB — BASIC METABOLIC PANEL
BUN: 11 mg/dL (ref 6–23)
CO2: 26 mEq/L (ref 19–32)
Calcium: 9.9 mg/dL (ref 8.4–10.5)
Chloride: 103 mEq/L (ref 96–112)
Creatinine, Ser: 0.73 mg/dL (ref 0.40–1.50)
GFR: 89.25 mL/min (ref >60.00)
Glucose, Bld: 138 mg/dL — ABNORMAL HIGH (ref 70–99)
Potassium: 4.6 mEq/L (ref 3.5–5.1)
Sodium: 138 mEq/L (ref 135–145)

## 2020-04-29 LAB — LIPID PANEL
Cholesterol: 247 mg/dL — ABNORMAL HIGH (ref 0–200)
HDL: 34.8 mg/dL — ABNORMAL LOW (ref >39.00)
LDL Cholesterol: 180 mg/dL — ABNORMAL HIGH (ref 0–99)
NonHDL: 211.88
Total CHOL/HDL Ratio: 7
Triglycerides: 160 mg/dL — ABNORMAL HIGH (ref 0.0–149.0)
VLDL: 32 mg/dL (ref 0.0–40.0)

## 2020-04-29 LAB — VITAMIN B12: Vitamin B-12: 1257 pg/mL — ABNORMAL HIGH (ref 211–911)

## 2020-04-29 LAB — URINALYSIS, ROUTINE W REFLEX MICROSCOPIC
Bilirubin Urine: NEGATIVE
Hgb urine dipstick: NEGATIVE
Ketones, ur: NEGATIVE
Leukocytes,Ua: NEGATIVE
Nitrite: NEGATIVE
RBC / HPF: NONE SEEN (ref 0–?)
Specific Gravity, Urine: >=1.03 — AB (ref 1.000–1.030)
Total Protein, Urine: NEGATIVE
Urine Glucose: NEGATIVE
Urobilinogen, UA: 0.2 (ref 0.0–1.0)
pH: 5.5 (ref 5.0–8.0)

## 2020-04-29 LAB — TSH: TSH: 1.13 u[IU]/mL (ref 0.35–4.50)

## 2020-04-29 LAB — HEMOGLOBIN A1C: Hgb A1c MFr Bld: 8.6 % — ABNORMAL HIGH (ref 4.6–6.5)

## 2020-04-29 LAB — PSA: PSA: 0.64 ng/mL (ref 0.10–4.00)

## 2020-04-29 LAB — VITAMIN D 25 HYDROXY (VIT D DEFICIENCY, FRACTURES): VITD: 15.6 ng/mL — ABNORMAL LOW (ref 30.00–100.00)

## 2020-04-29 MED ORDER — VITAMIN B-12 1000 MCG PO TABS
1000.0000 ug | ORAL_TABLET | Freq: Every day | ORAL | 3 refills | Status: DC
Start: 2020-04-29 — End: 2020-12-03

## 2020-04-29 MED ORDER — FLUTICASONE PROPIONATE 50 MCG/ACT NA SUSP
2.0000 | Freq: Every day | NASAL | 6 refills | Status: DC
Start: 2020-04-29 — End: 2021-06-25

## 2020-04-29 MED ORDER — BRIMONIDINE TARTRATE-TIMOLOL 0.2-0.5 % OP SOLN: 1.0000 [drp] | Freq: Two times a day (BID) | OPHTHALMIC | 1 refills | Status: DC

## 2020-04-29 MED ORDER — MONTELUKAST SODIUM 10 MG PO TABS: 10.0000 mg | ORAL_TABLET | Freq: Every day | ORAL | 3 refills | Status: DC

## 2020-04-29 MED ORDER — GLIPIZIDE 10 MG PO TABS: 10.0000 mg | ORAL_TABLET | Freq: Every day | ORAL | 3 refills | Status: DC

## 2020-04-29 MED ORDER — ATORVASTATIN CALCIUM 40 MG PO TABS: 40.0000 mg | ORAL_TABLET | Freq: Every day | ORAL | 3 refills | Status: DC

## 2020-04-29 MED ORDER — TAMSULOSIN HCL 0.4 MG PO CAPS: 0.4000 mg | ORAL_CAPSULE | Freq: Every day | ORAL | 3 refills | Status: DC

## 2020-04-29 MED ORDER — FLUOCINOLONE ACETONIDE 0.01 % OT OIL: 5.0000 [drp] | TOPICAL_OIL | Freq: Two times a day (BID) | OTIC | 1 refills | Status: DC

## 2020-04-29 MED ORDER — PROMETHAZINE-CODEINE 6.25-10 MG/5ML PO SOLN: ORAL | 0 refills | Status: DC

## 2020-04-29 NOTE — Progress Notes (Signed)
Patient ID: David Kelly, male   DOB: 1945-04-27, 75 y.o.   MRN: [id number]         Chief Complaint:: yearly exam and Medication Refill         HPI:  David Kelly is a 75 y.o. male here overall doing ok.  Pt denies chest pain, increased sob or doe, wheezing, orthopnea, PND, increased LE swelling, palpitations, dizziness or syncope.  Does have chronic cough, asks for cough med refill as cannot sleep.  Not taking B12.  Denies new worsening neuro focal s/s.   Pt denies polydipsia, polyuria,  Pt denies fever, wt loss, night sweats, loss of appetite, or other constitutional symptoms  No other new complaints.  Tolerating new statin and increased glipizide ok without myalgias or low sugars.     Wt Readings from Last 3 Encounters:  04/29/20 208 lb (94.3 kg)  10/24/19 208 lb (94.3 kg)  03/11/19 208 lb (94.3 kg)   BP Readings from Last 3 Encounters:  04/29/20 118/78  10/24/19 120/80  03/11/19 130/90   Immunization History  Administered Date(s) Administered  . Influenza, High Dose Seasonal PF 10/28/2015, 11/17/2017  . Moderna Sars-Covid-2 Vaccination 04/15/2019, 05/20/2019, 11/18/2019   There are no preventive care reminders to display for this patient.    Past Medical History:  Diagnosis Date  . Allergy   . Arthritis   . Blood transfusion without reported diagnosis    with bleeding colon per pt in the past   . BPH (benign prostatic hypertrophy)   . Diabetes mellitus    DX  5 YR AGO  . Glaucoma   . H/O: GI bleed   . HLD (hyperlipidemia) 09/28/2017  . Kidney stones   . Myocardial infarction [facility])    in the 1990's   Past Surgical History:  Procedure Laterality Date  . COLONOSCOPY    . CYSTOSCOPY WITH RETROGRADE PYELOGRAM, URETEROSCOPY AND STENT PLACEMENT Right 05/20/2016   Procedure: CYSTOSCOPY, URETEROSCOPY, TRANSURETHRAL INCISION OF URETHROCELE, BASKET STONE REMOVAL;  Surgeon: Irine Seal, MD;  Location: [facility];  Service: Urology;  Laterality: Right;  . Gall Bladder STones  removed    . left total knee replacement    . TOTAL KNEE ARTHROPLASTY  04/04/2011   Procedure: TOTAL KNEE ARTHROPLASTY;  Surgeon: Rudean Haskell, MD;  Location: [facility];  Service: Orthopedics;  Laterality: Left;    reports that he quit smoking about 14 years ago. His smoking use included cigarettes. He has a 67.50 pack-year smoking history. He quit smokeless tobacco use about 19 months ago.  His smokeless tobacco use included chew. He reports that he does not drink alcohol and does not use drugs. family history includes Arthritis in his mother; Asthma in his paternal grandfather; Colon polyps in his brother; Diabetes in his sister. No Known Allergies Current Outpatient Medications on File Prior to Visit  Medication Sig Dispense Refill  . aspirin EC 81 MG tablet Take 81 mg by mouth at bedtime.    . diclofenac sodium (VOLTAREN) 1 % GEL Apply 1 application topically 3 (three) times daily. Right knee and both shoulders. 3 Tube 4  . glucose blood (IGLUCOSE TEST STRIPS) test strip Use as instructed 100 each 12  . levocetirizine (XYZAL) 5 MG tablet     . metFORMIN (GLUCOPHAGE) 1000 MG tablet TAKE 1 TABLET BY MOUTH 2 TIMES DAILY WITH A MEAL 180 tablet 3  . Multiple Vitamin (MULTIVITAMIN WITH MINERALS) TABS tablet Take 1 tablet by mouth daily.    . naproxen (NAPROSYN)  500 MG tablet Take by mouth.    . psyllium (METAMUCIL) 58.6 % packet Take 1 packet by mouth daily.    Marland Kitchen triamcinolone ointment (KENALOG) 0.5 % APPLY TO AFFECTED AREA(S) TWICE DAILY 30 g 2  . TRUEplus Lancets 28G MISC 28 g by Other route as directed. 100 each 3   No current facility-administered medications on file prior to visit.        ROS:  All others reviewed and negative.  Objective        PE:  BP 118/78   Pulse 72   Temp 98.6 F (37 C) (Oral)   Ht 6' (1.829 m)   Wt 208 lb (94.3 kg)   SpO2 99%   BMI 28.21 kg/m                 Constitutional: Pt appears in NAD               HENT: Head: NCAT.                Right Ear:  External ear normal.                 Left Ear: External ear normal.                Eyes: . Pupils are equal, round, and reactive to light. Conjunctivae and EOM are normal               Nose: without d/c or deformity               Neck: Neck supple. Gross normal ROM               Cardiovascular: Normal rate and regular rhythm.                 Pulmonary/Chest: Effort normal and breath sounds without rales or wheezing.                Abd:  Soft, NT, ND, + BS, no organomegaly               Neurological: Pt is alert. At baseline orientation, motor grossly intact               Skin: Skin is warm. No rashes, no other new lesions, LE edema - none               Psychiatric: Pt behavior is normal without agitation   Micro: none  Cardiac tracings I have personally interpreted today:  none  Pertinent Radiological findings (summarize): none   Lab Results  Component Value Date   WBC 7.4 04/29/2020   HGB 12.2 (L) 04/29/2020   HCT 37.3 (L) 04/29/2020   PLT 294.0 04/29/2020   GLUCOSE 138 (H) 04/29/2020   CHOL 247 (H) 04/29/2020   TRIG 160.0 (H) 04/29/2020   HDL 34.80 (L) 04/29/2020   LDLDIRECT 86.0 03/31/2017   LDLCALC 180 (H) 04/29/2020   ALT 20 04/29/2020   AST 18 04/29/2020   NA 138 04/29/2020   K 4.6 04/29/2020   CL 103 04/29/2020   CREATININE 0.73 04/29/2020   BUN 11 04/29/2020   CO2 26 04/29/2020   TSH 1.13 04/29/2020   PSA 0.64 04/29/2020   INR 1.06 03/22/2011   HGBA1C 8.6 (H) 04/29/2020   MICROALBUR 2.6 (H) 04/29/2020   Assessment/Plan:  David Kelly is a 75 y.o. Black or African American [2] male with  has a past medical history of Allergy, Arthritis, Blood transfusion  without reported diagnosis, BPH (benign prostatic hypertrophy), Diabetes mellitus, Glaucoma, H/O: GI bleed, HLD (hyperlipidemia) (09/28/2017), Kidney stones, and Myocardial infarction ([place]).  HLD (hyperlipidemia) Lab Results  Component Value Date   LDLCALC 180 (H) 04/29/2020   uncontrolled, pt to start  current statin lipitor 40  Type 2 diabetes mellitus ([facility])  Stable, pt to continue current medical treatment metformin , glipizide - for a1c today, goal < 7  B12 deficiency Mild low, for b12 1000 mcg qd to start   Chronic cough Chronic stable, declines cxr or pulm referral  Followup: Return in about 6 months (around 10/30/2020).  Cathlean Cower, MD 04/29/2020 10:28 PM [facility]

## 2020-04-29 NOTE — Assessment & Plan Note (Addendum)
  Stable, pt to continue current medical treatment metformin , glipizide - for a1c today, goal < 7

## 2020-04-29 NOTE — Assessment & Plan Note (Signed)
Chronic stable, declines cxr or pulm referral

## 2020-04-29 NOTE — Patient Instructions (Signed)

## 2020-04-29 NOTE — Assessment & Plan Note (Signed)
Mild low, for b12 1000 mcg qd to start

## 2020-04-29 NOTE — Assessment & Plan Note (Addendum)
Lab Results  Component Value Date   LDLCALC 180 (H) 04/29/2020   uncontrolled, pt to start current statin lipitor 40

## 2020-04-30 ENCOUNTER — Encounter: Payer: Self-pay | Admitting: Internal Medicine

## 2020-04-30 DIAGNOSIS — E559 Vitamin D deficiency, unspecified: Secondary | ICD-10-CM | POA: Insufficient documentation

## 2020-05-19 ENCOUNTER — Other Ambulatory Visit: Payer: Self-pay | Admitting: Internal Medicine

## 2020-05-19 DIAGNOSIS — E119 Type 2 diabetes mellitus without complications: Secondary | ICD-10-CM

## 2020-05-19 NOTE — Telephone Encounter (Signed)
Please refill as per office routine med refill policy (all routine meds refilled for 3 mo or monthly per pt preference up to one year from last visit, then month to month grace period for 3 mo, then further med refills will have to be denied)  

## 2020-06-23 ENCOUNTER — Ambulatory Visit (INDEPENDENT_AMBULATORY_CARE_PROVIDER_SITE_OTHER): Payer: Medicare Other | Admitting: Sports Medicine

## 2020-06-23 ENCOUNTER — Other Ambulatory Visit: Payer: Self-pay

## 2020-06-23 ENCOUNTER — Encounter: Payer: Self-pay | Admitting: Sports Medicine

## 2020-06-23 DIAGNOSIS — M79676 Pain in unspecified toe(s): Secondary | ICD-10-CM | POA: Diagnosis not present

## 2020-06-23 DIAGNOSIS — M2041 Other hammer toe(s) (acquired), right foot: Secondary | ICD-10-CM

## 2020-06-23 DIAGNOSIS — E119 Type 2 diabetes mellitus without complications: Secondary | ICD-10-CM | POA: Diagnosis not present

## 2020-06-23 DIAGNOSIS — M2140 Flat foot [pes planus] (acquired), unspecified foot: Secondary | ICD-10-CM

## 2020-06-23 DIAGNOSIS — B351 Tinea unguium: Secondary | ICD-10-CM

## 2020-06-23 DIAGNOSIS — M21619 Bunion of unspecified foot: Secondary | ICD-10-CM

## 2020-06-23 DIAGNOSIS — M2042 Other hammer toe(s) (acquired), left foot: Secondary | ICD-10-CM

## 2020-06-23 NOTE — Progress Notes (Signed)
Subjective: David Kelly is a 75 y.o. male patient with history of diabetes who returns to office today complaining of long, painful nails while ambulating in shoes; unable to trim. Denies any other pedal complaints.   Patient states that the glucose reading today was 169, A1C unknown, PCP Dr. [name] x 4 months ago  Patient Active Problem List   Diagnosis Date Noted  . Vitamin D deficiency 04/30/2020  . B12 deficiency 04/29/2020  . Abscess of finger 09/06/2018  . MI (myocardial infarction) ([place]) 12/11/2017  . History of left knee replacement 12/11/2017  . HLD (hyperlipidemia) 09/28/2017  . Chronic cough 03/31/2017  . Primary osteoarthritis of right knee 03/15/2017  . Unspecified injury of lower back, sequela 03/15/2017  . Bilateral rotator cuff syndrome 03/15/2017  . Recurrent falls while walking 12/13/2016  . Preventative health care 12/13/2016  . Chronic pain 12/13/2016  . Kidney stones   . Glaucoma   . Gastrointestinal bleed 06/28/2016  . Dysuria 10/28/2015  . Itching of ear 08/13/2015  . Type 2 diabetes mellitus ([place]) 07/15/2015  . Osteoarthritis 07/15/2015   Current Outpatient Medications on File Prior to Visit  Medication Sig Dispense Refill  . ACCU-CHEK AVIVA PLUS test strip USE AS DIRECTED 100 each 12  . aspirin EC 81 MG tablet Take 81 mg by mouth at bedtime.    Marland Kitchen atorvastatin (LIPITOR) 40 MG tablet Take 1 tablet (40 mg total) by mouth daily. 90 tablet 3  . brimonidine-timolol (COMBIGAN) 0.2-0.5 % ophthalmic solution Place 1 drop into both eyes every 12 (twelve) hours. 15 mL 1  . diclofenac sodium (VOLTAREN) 1 % GEL Apply 1 application topically 3 (three) times daily. Right knee and both shoulders. 3 Tube 4  . Fluocinolone Acetonide 0.01 % OIL Place 5 drops in ear(s) 2 (two) times daily. 20 mL 1  . fluticasone (FLONASE) 50 MCG/ACT nasal spray Place 2 sprays into both nostrils daily. 16 g 6  . glipiZIDE (GLUCOTROL) 10 MG tablet Take 1 tablet (10 mg total) by mouth  daily before breakfast. 90 tablet 3  . levocetirizine (XYZAL) 5 MG tablet     . metFORMIN (GLUCOPHAGE) 1000 MG tablet TAKE 1 TABLET BY MOUTH 2 TIMES DAILY WITH A MEAL 180 tablet 3  . montelukast (SINGULAIR) 10 MG tablet Take 1 tablet (10 mg total) by mouth at bedtime. 90 tablet 3  . Multiple Vitamin (MULTIVITAMIN WITH MINERALS) TABS tablet Take 1 tablet by mouth daily.    . naproxen (NAPROSYN) 500 MG tablet Take by mouth.    . Promethazine-Codeine 6.25-10 MG/5ML SOLN TAKE 1 TEASPOONFUL BY MOUTH EVERY 4 HOURS AS NEEDED FOR COUGH 180 mL 0  . psyllium (METAMUCIL) 58.6 % packet Take 1 packet by mouth daily.    . tamsulosin (FLOMAX) 0.4 MG CAPS capsule Take 1 capsule (0.4 mg total) by mouth at bedtime. 90 capsule 3  . triamcinolone ointment (KENALOG) 0.5 % APPLY TO AFFECTED AREA(S) TWICE DAILY 30 g 2  . TRUEplus Lancets 28G MISC USE AS DIRECTED 100 each 3  . vitamin B-12 (CYANOCOBALAMIN) 1000 MCG tablet Take 1 tablet (1,000 mcg total) by mouth daily. 90 tablet 3   No current facility-administered medications on file prior to visit.   No Known Allergies  Recent Results (from the past 2160 hour(s))  Microalbumin / creatinine urine ratio     Status: Abnormal   Collection Time: 04/29/20  3:01 PM  Result Value Ref Range   Microalb, Ur 2.6 (H) 0.0 - 1.9 mg/dL   Creatinine,U  104.9 mg/dL   Microalb Creat Ratio 2.5 0.0 - 30.0 mg/g  Hemoglobin A1c     Status: Abnormal   Collection Time: 04/29/20  3:01 PM  Result Value Ref Range   Hgb A1c MFr Bld 8.6 (H) 4.6 - 6.5 %    Comment: Glycemic Control Guidelines for People with Diabetes:Non Diabetic:  <6%Goal of Therapy: <7%Additional Action Suggested:  >8%   Lipid panel     Status: Abnormal   Collection Time: 04/29/20  3:01 PM  Result Value Ref Range   Cholesterol 247 (H) 0 - 200 mg/dL    Comment: ATP III Classification       Desirable:  < 200 mg/dL               Borderline High:  200 - 239 mg/dL          High:  > = 240 mg/dL   Triglycerides 160.0 (H)  0.0 - 149.0 mg/dL    Comment: Normal:  <150 mg/dLBorderline High:  150 - 199 mg/dL   HDL 34.80 (L) >39.00 mg/dL   VLDL 32.0 0.0 - 40.0 mg/dL   LDL Cholesterol 180 (H) 0 - 99 mg/dL   Total CHOL/HDL Ratio 7     Comment:                Men          Women1/2 Average Risk     3.4          3.3Average Risk          5.0          4.42X Average Risk          9.6          7.13X Average Risk          15.0          11.0                       NonHDL 211.88     Comment: NOTE:  Non-HDL goal should be 30 mg/dL higher than patient's LDL goal (i.e. LDL goal of < 70 mg/dL, would have non-HDL goal of < 100 mg/dL)  Hepatic function panel     Status: None   Collection Time: 04/29/20  3:01 PM  Result Value Ref Range   Total Bilirubin 0.4 0.2 - 1.2 mg/dL   Bilirubin, Direct 0.1 0.0 - 0.3 mg/dL   Alkaline Phosphatase 58 39 - 117 U/L   AST 18 0 - 37 U/L   ALT 20 0 - 53 U/L   Total Protein 7.5 6.0 - 8.3 g/dL   Albumin 4.5 3.5 - 5.2 g/dL  CBC with Differential/Platelet     Status: Abnormal   Collection Time: 04/29/20  3:01 PM  Result Value Ref Range   WBC 7.4 4.0 - 10.5 K/uL   RBC 4.51 4.22 - 5.81 Mil/uL   Hemoglobin 12.2 (L) 13.0 - 17.0 g/dL   HCT 37.3 (L) 39.0 - 52.0 %   MCV 82.7 78.0 - 100.0 fl   MCHC 32.6 30.0 - 36.0 g/dL   RDW 14.3 11.5 - 15.5 %   Platelets 294.0 150.0 - 400.0 K/uL   Neutrophils Relative % 67.6 43.0 - 77.0 %   Lymphocytes Relative 22.4 12.0 - 46.0 %   Monocytes Relative 7.7 3.0 - 12.0 %   Eosinophils Relative 2.0 0.0 - 5.0 %   Basophils Relative 0.3 0.0 - 3.0 %   Neutro Abs  5.0 1.4 - 7.7 K/uL   Lymphs Abs 1.6 0.7 - 4.0 K/uL   Monocytes Absolute 0.6 0.1 - 1.0 K/uL   Eosinophils Absolute 0.1 0.0 - 0.7 K/uL   Basophils Absolute 0.0 0.0 - 0.1 K/uL  TSH     Status: None   Collection Time: 04/29/20  3:01 PM  Result Value Ref Range   TSH 1.13 0.35 - 4.50 uIU/mL  Urinalysis, Routine w reflex microscopic     Status: Abnormal   Collection Time: 04/29/20  3:01 PM  Result Value Ref Range    Color, Urine YELLOW Yellow;Lt. Yellow;Straw;Dark Yellow;Amber;Green;Red;Brown   APPearance CLEAR Clear;Turbid;Slightly Cloudy;Cloudy   Specific Gravity, Urine >=1.030 (A) 1.000 - 1.030   pH 5.5 5.0 - 8.0   Total Protein, Urine NEGATIVE Negative   Urine Glucose NEGATIVE Negative   Ketones, ur NEGATIVE Negative   Bilirubin Urine NEGATIVE Negative   Hgb urine dipstick NEGATIVE Negative   Urobilinogen, UA 0.2 0.0 - 1.0   Leukocytes,Ua NEGATIVE Negative   Nitrite NEGATIVE Negative   WBC, UA 0-2/hpf 0-2/hpf   RBC / HPF none seen 0-2/hpf   Uric Acid Crys, UA Presence of (A) None  PSA     Status: None   Collection Time: 04/29/20  3:01 PM  Result Value Ref Range   PSA 0.64 0.10 - 4.00 ng/mL    Comment: Test performed using Access Hybritech PSA Assay, a parmagnetic partical, chemiluminecent immunoassay.  Basic metabolic panel     Status: Abnormal   Collection Time: 04/29/20  3:01 PM  Result Value Ref Range   Sodium 138 135 - 145 mEq/L   Potassium 4.6 3.5 - 5.1 mEq/L   Chloride 103 96 - 112 mEq/L   CO2 26 19 - 32 mEq/L   Glucose, Bld 138 (H) 70 - 99 mg/dL   BUN 11 6 - 23 mg/dL   Creatinine, Ser 0.73 0.40 - 1.50 mg/dL   GFR 89.25 >60.00 mL/min    Comment: Calculated using the CKD-EPI Creatinine Equation (2021)   Calcium 9.9 8.4 - 10.5 mg/dL  Vitamin B12     Status: Abnormal   Collection Time: 04/29/20  3:01 PM  Result Value Ref Range   Vitamin B-12 1,257 (H) 211 - 911 pg/mL  VITAMIN D 25 Hydroxy (Vit-D Deficiency, Fractures)     Status: Abnormal   Collection Time: 04/29/20  3:01 PM  Result Value Ref Range   VITD 15.60 (L) 30.00 - 100.00 ng/mL    Objective: General: Patient is awake, alert, and oriented x 3 and in no acute distress. No acute changes with physical exam  Integument: Skin is warm, dry and supple bilateral. Nails are tender, mildly elongated, thickened and dystrophic with subungual debris, consistent with onychomycosis, 1-5 bilateral. No signs of infection. No open  lesions or preulcerative lesions present bilateral.  Remaining integument unremarkable.  Vasculature:  Dorsalis Pedis pulse 2/4 bilateral. Posterior Tibial pulse 1 /4 bilateral. Capillary fill time <3 sec 1-5 bilateral. Positive hair growth to the level of the digits.Temperature gradient within normal limits. No varicosities present bilateral. No edema present bilateral.   Neurology: The patient has intact sensation measured with a 5.07/10g Semmes Weinstein Monofilament at all pedal sites bilateral . Vibratory sensation diminished bilateral with tuning fork. No Babinski sign present bilateral.   Musculoskeletal: Asymptomatic bunion and hammertoe and planus pedal deformities noted bilateral. Muscular strength 5/5 in all lower extremity muscular groups bilateral without pain on range of motion. No tenderness with calf compression bilateral.  Assessment and  Plan: Problem List Items Addressed This Visit   None   Visit Diagnoses    Pain due to onychomycosis of toenail    -  Primary   Diabetes mellitus without complication (HCC)       Hammer toes of both feet       Pes planus, unspecified laterality       Bunion         -Examined patient. -Re-Discussed and educated patient on diabetic foot care and the importance of daily inspection of feet in the setting of diabetes -Mechanically debrided all nails 1-5 bilateral using sterile nail nipper and filed with dremel without incident -Patient to return  in 3 months for at risk foot care or sooner if there are any problems or issues  Landis Martins, DPM

## 2020-08-28 ENCOUNTER — Other Ambulatory Visit: Payer: Self-pay | Admitting: Internal Medicine

## 2020-09-10 DIAGNOSIS — R221 Localized swelling, mass and lump, neck: Secondary | ICD-10-CM | POA: Insufficient documentation

## 2020-09-23 ENCOUNTER — Other Ambulatory Visit: Payer: Self-pay

## 2020-09-23 ENCOUNTER — Ambulatory Visit (INDEPENDENT_AMBULATORY_CARE_PROVIDER_SITE_OTHER): Payer: Medicare Other | Admitting: Sports Medicine

## 2020-09-23 ENCOUNTER — Encounter: Payer: Self-pay | Admitting: Sports Medicine

## 2020-09-23 DIAGNOSIS — M79676 Pain in unspecified toe(s): Secondary | ICD-10-CM | POA: Diagnosis not present

## 2020-09-23 DIAGNOSIS — M21619 Bunion of unspecified foot: Secondary | ICD-10-CM

## 2020-09-23 DIAGNOSIS — E119 Type 2 diabetes mellitus without complications: Secondary | ICD-10-CM | POA: Diagnosis not present

## 2020-09-23 DIAGNOSIS — M2140 Flat foot [pes planus] (acquired), unspecified foot: Secondary | ICD-10-CM

## 2020-09-23 DIAGNOSIS — B351 Tinea unguium: Secondary | ICD-10-CM | POA: Diagnosis not present

## 2020-09-23 DIAGNOSIS — M2042 Other hammer toe(s) (acquired), left foot: Secondary | ICD-10-CM

## 2020-09-23 DIAGNOSIS — M2041 Other hammer toe(s) (acquired), right foot: Secondary | ICD-10-CM

## 2020-09-23 NOTE — Progress Notes (Signed)
Subjective: David Kelly is a 75 y.o. male patient with history of diabetes who returns to office today complaining of long, painful nails while ambulating in shoes; unable to trim. Denies any other pedal complaints.   Patient states that the glucose reading today was 116, A1C unknown, PCP Dr. Jenny Kelly x 6 months ago  Patient Active Problem List   Diagnosis Date Noted   Vitamin D deficiency 04/30/2020   B12 deficiency 04/29/2020   Abscess of finger 09/06/2018   MI (myocardial infarction) ([name]) 12/11/2017   History of left knee replacement 12/11/2017   HLD (hyperlipidemia) 09/28/2017   Chronic cough 03/31/2017   Primary osteoarthritis of right knee 03/15/2017   Unspecified injury of lower back, sequela 03/15/2017   Bilateral rotator cuff syndrome 03/15/2017   Recurrent falls while walking 12/13/2016   Preventative health care 12/13/2016   Chronic pain 12/13/2016   Kidney stones    Glaucoma    Gastrointestinal bleed 06/28/2016   Dysuria 10/28/2015   Itching of ear 08/13/2015   Type 2 diabetes mellitus ([name]) 07/15/2015   Osteoarthritis 07/15/2015   Current Outpatient Medications on File Prior to Visit  Medication Sig Dispense Refill   ACCU-CHEK AVIVA PLUS test strip USE AS DIRECTED 100 each 12   aspirin EC 81 MG tablet Take 81 mg by mouth at bedtime.     atorvastatin (LIPITOR) 40 MG tablet Take 1 tablet (40 mg total) by mouth daily. 90 tablet 3   brimonidine-timolol (COMBIGAN) 0.2-0.5 % ophthalmic solution Place 1 drop into both eyes every 12 (twelve) hours. 15 mL 1   diclofenac sodium (VOLTAREN) 1 % GEL Apply 1 application topically 3 (three) times daily. Right knee and both shoulders. 3 Tube 4   Fluocinolone Acetonide 0.01 % OIL Place 5 drops in ear(s) 2 (two) times daily. 20 mL 1   fluticasone (FLONASE) 50 MCG/ACT nasal spray Place 2 sprays into both nostrils daily. 16 g 6   glipiZIDE (GLUCOTROL) 10 MG tablet Take 1 tablet (10 mg total) by mouth daily before breakfast. 90  tablet 3   levocetirizine (XYZAL) 5 MG tablet      metFORMIN (GLUCOPHAGE) 1000 MG tablet TAKE 1 TABLET BY MOUTH 2 TIMES DAILY WITH A MEAL 180 tablet 3   montelukast (SINGULAIR) 10 MG tablet Take 1 tablet (10 mg total) by mouth at bedtime. 90 tablet 3   Multiple Vitamin (MULTIVITAMIN WITH MINERALS) TABS tablet Take 1 tablet by mouth daily.     naproxen (NAPROSYN) 500 MG tablet Take by mouth.     Promethazine-Codeine 6.25-10 MG/5ML SOLN TAKE 5ML BY MOUTH EVERY 4 HOURS AS NEEDED FOR COUGH 180 mL 0   psyllium (METAMUCIL) 58.6 % packet Take 1 packet by mouth daily.     tamsulosin (FLOMAX) 0.4 MG CAPS capsule Take 1 capsule (0.4 mg total) by mouth at bedtime. 90 capsule 3   triamcinolone ointment (KENALOG) 0.5 % APPLY TO AFFECTED AREA(S) TWICE DAILY 30 g 2   TRUEplus Lancets 28G MISC USE AS DIRECTED 100 each 3   vitamin B-12 (CYANOCOBALAMIN) 1000 MCG tablet Take 1 tablet (1,000 mcg total) by mouth daily. 90 tablet 3   No current facility-administered medications on file prior to visit.   No Known Allergies  No results found for this or any previous visit (from the past 2160 hour(s)).   Objective: General: Patient is awake, alert, and oriented x 3 and in no acute distress. No acute changes with physical exam  Integument: Skin is warm, dry and supple bilateral.  Nails are tender, mildly elongated, thickened and dystrophic with subungual debris, consistent with onychomycosis, 1-5 bilateral. No signs of infection. No open lesions or preulcerative lesions present bilateral.  Remaining integument unremarkable.  Vasculature:  Dorsalis Pedis pulse 2/4 bilateral. Posterior Tibial pulse 1 /4 bilateral. Capillary fill time <3 sec 1-5 bilateral. Positive hair growth to the level of the digits.Temperature gradient within normal limits. No varicosities present bilateral. No edema present bilateral.   Neurology: The patient has intact sensation measured with a 5.07/10g Semmes Weinstein Monofilament at all pedal  sites bilateral . Vibratory sensation diminished bilateral with tuning fork. No Babinski sign present bilateral.   Musculoskeletal: Asymptomatic bunion and hammertoe and planus pedal deformities noted bilateral. Muscular strength 5/5 in all lower extremity muscular groups bilateral without pain on range of motion. No tenderness with calf compression bilateral.  Assessment and Plan: Problem List Items Addressed This Visit   None Visit Diagnoses     Pain due to onychomycosis of toenail    -  Primary   Diabetes mellitus without complication (HCC)       Hammer toes of both feet       Pes planus, unspecified laterality       Bunion          -Examined patient. -Re-Discussed and educated patient on diabetic foot care and the importance of daily inspection of feet in the setting of diabetes -Mechanically debrided all nails 1-5 bilateral using sterile nail nipper and filed with dremel without incident -Patient to return  in 3 months for at risk foot care or sooner if there are any problems or issues  Landis Martins, DPM

## 2020-10-07 ENCOUNTER — Other Ambulatory Visit (HOSPITAL_COMMUNITY): Payer: Self-pay | Admitting: Otolaryngology

## 2020-10-07 DIAGNOSIS — R221 Localized swelling, mass and lump, neck: Secondary | ICD-10-CM

## 2020-10-08 ENCOUNTER — Encounter (HOSPITAL_COMMUNITY): Payer: Self-pay | Admitting: Radiology

## 2020-10-08 NOTE — Progress Notes (Signed)
Patient Name  David Kelly, David Kelly Legal Sex  Male DOB  1945/06/26 SSN  [id number] Address  [place] [address] Phone  [phone] [facility])  [phone] (Mobile)    RE: [place] CORE BIOPSY (LYMPH NODES) Received: Today Suttle, Rosanne Ashing, MD  Garth Bigness D Approved for ultrasound guided right level II cervical lymph node biopsy.     Dylan        Previous Messages   ----- Message -----  From: Garth Bigness D  Sent: 10/07/2020   5:52 PM EDT  To: [facility] Procedure Requests  Subject: [place] CORE BIOPSY (LYMPH NODES)                   Procedure:  [place] CORE BIOPSY (LYMPH NODES)   Reason:  Mass of right side of neck   History:  outside CT Neck done on 07/13/20 at [facility], images in [place] found under: Darrol Angel   Provider:  Melida Quitter   Provider Contact:  [phone]

## 2020-10-15 ENCOUNTER — Other Ambulatory Visit: Payer: Self-pay | Admitting: Radiology

## 2020-10-20 ENCOUNTER — Ambulatory Visit (HOSPITAL_COMMUNITY)
Admission: RE | Admit: 2020-10-20 | Discharge: 2020-10-20 | Disposition: A | Discharge status: Home / Self Care | Payer: Medicare Other | Source: Ambulatory Visit | Attending: Otolaryngology | Admitting: Otolaryngology

## 2020-10-20 ENCOUNTER — Encounter (HOSPITAL_COMMUNITY): Payer: Self-pay

## 2020-10-20 ENCOUNTER — Other Ambulatory Visit: Payer: Self-pay

## 2020-10-20 DIAGNOSIS — Z87891 Personal history of nicotine dependence: Secondary | ICD-10-CM | POA: Diagnosis not present

## 2020-10-20 DIAGNOSIS — R221 Localized swelling, mass and lump, neck: Secondary | ICD-10-CM

## 2020-10-20 DIAGNOSIS — Z96652 Presence of left artificial knee joint: Secondary | ICD-10-CM | POA: Diagnosis not present

## 2020-10-20 DIAGNOSIS — Z5948 Other specified lack of adequate food: Secondary | ICD-10-CM | POA: Insufficient documentation

## 2020-10-20 DIAGNOSIS — C969 Malignant neoplasm of lymphoid, hematopoietic and related tissue, unspecified: Secondary | ICD-10-CM | POA: Insufficient documentation

## 2020-10-20 LAB — GLUCOSE, CAPILLARY: Glucose-Capillary: 197 mg/dL — ABNORMAL HIGH (ref 70–99)

## 2020-10-20 MED ORDER — FENTANYL CITRATE (PF) 100 MCG/2ML IJ SOLN
INTRAMUSCULAR | Status: AC | PRN
  Administered 2020-10-20 (×2): 25 ug via INTRAVENOUS

## 2020-10-20 MED ORDER — MIDAZOLAM HCL 2 MG/2ML IJ SOLN
INTRAMUSCULAR | Status: AC | PRN
  Administered 2020-10-20: 1 mg via INTRAVENOUS

## 2020-10-20 MED ORDER — MIDAZOLAM HCL 2 MG/2ML IJ SOLN
INTRAMUSCULAR | Status: AC
  Filled 2020-10-20: qty 2

## 2020-10-20 MED ORDER — FENTANYL CITRATE (PF) 100 MCG/2ML IJ SOLN
INTRAMUSCULAR | Status: AC
  Filled 2020-10-20: qty 2

## 2020-10-20 MED ORDER — LIDOCAINE-EPINEPHRINE 1 %-1:100000 IJ SOLN
INTRAMUSCULAR | Status: AC
  Filled 2020-10-20: qty 1

## 2020-10-20 MED ORDER — SODIUM CHLORIDE 0.9 % IV SOLN: INTRAVENOUS | Status: DC

## 2020-10-20 NOTE — H&P (Signed)
Chief Complaint: Patient was seen in consultation today for biopsy of right cervical lymph node  at the request of Bates,Dwight  Referring Physician(s): Greene  Supervising Physician: Corrie Mckusick  Patient Status: [facility] - Out-pt  History of Present Illness: David Kelly is a 75 y.o. male with history of right sided neck mass with US guided biopsy.  Findings showed acute and chronic inflammation.  Scheduled now for biopsy of right sided cervical lymph node by Dr. Redmond Baseman for tissue diagnosis.  Past Medical History:  Diagnosis Date   Allergy    Arthritis    Blood transfusion without reported diagnosis    with bleeding colon per pt in the past    BPH (benign prostatic hypertrophy)    Diabetes mellitus    DX  5 YR AGO   Glaucoma    H/O: GI bleed    HLD (hyperlipidemia) 09/28/2017   Kidney stones    Myocardial infarction ([place])    in the 1990's    Past Surgical History:  Procedure Laterality Date   COLONOSCOPY     CYSTOSCOPY WITH RETROGRADE PYELOGRAM, URETEROSCOPY AND STENT PLACEMENT Right 05/20/2016   Procedure: CYSTOSCOPY, URETEROSCOPY, TRANSURETHRAL INCISION OF URETHROCELE, BASKET STONE REMOVAL;  Surgeon: Irine Seal, MD;  Location: [facility];  Service: Urology;  Laterality: Right;   Gall Bladder STones removed     left total knee replacement     TOTAL KNEE ARTHROPLASTY  04/04/2011   Procedure: TOTAL KNEE ARTHROPLASTY;  Surgeon: Rudean Haskell, MD;  Location: [place];  Service: Orthopedics;  Laterality: Left;    Allergies: Patient has no known allergies.  Medications: Prior to Admission medications   Medication Sig Start Date End Date Taking? Authorizing Provider  atorvastatin (LIPITOR) 40 MG tablet Take 1 tablet (40 mg total) by mouth daily. 04/29/20  Yes Biagio Borg, MD  brimonidine-timolol (COMBIGAN) 0.2-0.5 % ophthalmic solution Place 1 drop into both eyes every 12 (twelve) hours. 04/29/20  Yes Biagio Borg, MD  diclofenac sodium (VOLTAREN) 1 % GEL Apply 1  application topically 3 (three) times daily. Right knee and both shoulders. 03/15/17  Yes Meredith Staggers, MD  fluticasone [facility]) 50 MCG/ACT nasal spray Place 2 sprays into both nostrils daily. 04/29/20  Yes Biagio Borg, MD  glipiZIDE (GLUCOTROL) 10 MG tablet Take 1 tablet (10 mg total) by mouth daily before breakfast. 04/29/20  Yes Biagio Borg, MD  metFORMIN (GLUCOPHAGE) 1000 MG tablet TAKE 1 TABLET BY MOUTH 2 TIMES DAILY WITH A MEAL 12/09/19  Yes Biagio Borg, MD  Multiple Vitamin (MULTIVITAMIN WITH MINERALS) TABS tablet Take 1 tablet by mouth daily.   Yes [provider]  naproxen (NAPROSYN) 500 MG tablet Take by mouth. 12/07/17  Yes [provider]  tamsulosin (FLOMAX) 0.4 MG CAPS capsule Take 1 capsule (0.4 mg total) by mouth at bedtime. 04/29/20  Yes Biagio Borg, MD  triamcinolone ointment (KENALOG) 0.5 % APPLY TO AFFECTED AREA(S) TWICE DAILY 07/17/19  Yes Biagio Borg, MD  vitamin B-12 (CYANOCOBALAMIN) 1000 MCG tablet Take 1 tablet (1,000 mcg total) by mouth daily. 04/29/20  Yes Biagio Borg, MD  ACCU-CHEK AVIVA PLUS test strip USE AS DIRECTED 05/19/20   Biagio Borg, MD  aspirin EC 81 MG tablet Take 81 mg by mouth at bedtime.    [provider]  Fluocinolone Acetonide 0.01 % OIL Place 5 drops in ear(s) 2 (two) times daily. 04/29/20   Biagio Borg, MD  levocetirizine (XYZAL) 5 MG  tablet  08/09/18   [provider]  montelukast (SINGULAIR) 10 MG tablet Take 1 tablet (10 mg total) by mouth at bedtime. 04/29/20   Biagio Borg, MD  Promethazine-Codeine 6.25-10 MG/5ML SOLN TAKE 5ML BY MOUTH EVERY 4 HOURS AS NEEDED FOR COUGH 08/28/20   Biagio Borg, MD  psyllium (METAMUCIL) 58.6 % packet Take 1 packet by mouth daily.    [provider]  TRUEplus Lancets 28G MISC USE AS DIRECTED 05/19/20   Biagio Borg, MD     Family History  Problem Relation Age of Onset   Arthritis Mother    Asthma Paternal Grandfather    Diabetes Sister    Colon polyps  Brother    Colon cancer Neg Hx    Esophageal cancer Neg Hx    Rectal cancer Neg Hx    Stomach cancer Neg Hx     Social History   Socioeconomic History   Marital status: Single    Spouse name: Not on file   Number of children: 4   Years of education: 11   Highest education level: Not on file  Occupational History   Occupation: retired  Tobacco Use   Smoking status: Former    Packs/day: 1.50    Years: 45.00    Pack years: 67.50    Types: Cigarettes    Quit date: 03/21/2006    Years since quitting: 14.5   Smokeless tobacco: Former    Types: Chew    Quit date: 09/14/2018  Vaping Use   Vaping Use: Never used  Substance and Sexual Activity   Alcohol use: No   Drug use: No   Sexual activity: Not Currently  Other Topics Concern   Not on file  Social History Narrative   Fun: Fish and go to the park   Social Determinants of Radio broadcast assistant Strain: High Risk   Difficulty of Paying Living Expenses: Hard  Food Insecurity: Landscape architect Present   Worried About Charity fundraiser in the Last Year: Often true   Arboriculturist in the Last Year: Often true  Transportation Needs: No Transportation Needs   Lack of Transportation (Medical): No   Lack of Transportation (Non-Medical): No  Physical Activity: Insufficiently Active   Days of Exercise per Week: 3 days   Minutes of Exercise per Session: 20 min  Stress: No Stress Concern Present   Feeling of Stress : Not at all  Social Connections: Unknown   Frequency of Communication with Friends and Family: More than three times a week   Frequency of Social Gatherings with Friends and Family: Once a week   Attends Religious Services: Patient refused   Marine scientist or Organizations: Patient refused   Attends Archivist Meetings: Patient refused   Marital Status: Never married     Review of Systems: A 12 point ROS discussed and pertinent positives are indicated in the HPI above.  All other systems  are negative.  Review of Systems  Constitutional: Negative.   HENT:  Negative for facial swelling, sore throat, trouble swallowing and voice change.   Respiratory: Negative.    Gastrointestinal: Negative.   Endocrine: Negative.   Skin: Negative.   Psychiatric/Behavioral:  Negative for behavioral problems and confusion.    Vital Signs: BP (!) 158/88   Pulse 90   Temp 98.1 F (36.7 C) (Oral)   Ht 6' (1.829 m)   Wt 209 lb (94.8 kg)   BMI 28.35 kg/m  Physical Exam Constitutional:      Appearance: Normal appearance.  HENT:     Head: Normocephalic and atraumatic.     Mouth/Throat:     Mouth: Mucous membranes are moist.  Cardiovascular:     Rate and Rhythm: Normal rate and regular rhythm.  Pulmonary:     Breath sounds: Wheezing present.     Comments: Wheeze of upper left lung Abdominal:     General: Abdomen is flat.     Palpations: Abdomen is soft.  Neurological:     Mental Status: He is alert and oriented to person, place, and time.  Psychiatric:        Behavior: Behavior normal.    Imaging: None available  Labs:  CBC: Recent Labs    04/29/20 1501  WBC 7.4  HGB 12.2*  HCT 37.3*  PLT 294.0    COAGS: No results for input(s): INR, APTT in the last 8760 hours.  BMP: Recent Labs    04/29/20 1501  NA 138  K 4.6  CL 103  CO2 26  GLUCOSE 138*  BUN 11  CALCIUM 9.9  CREATININE 0.73    LIVER FUNCTION TESTS: Recent Labs    04/29/20 1501  BILITOT 0.4  AST 18  ALT 20  ALKPHOS 58  PROT 7.5  ALBUMIN 4.5       Assessment and Plan:  Right sided neck mass with lymphadenopathy.  Here for cervical node biopsy today.    Risks and benefits of right cervical node biopsy was discussed with the patient and/or patient's family including, but not limited to bleeding, infection, damage to adjacent structures or low yield requiring additional tests.  All of the questions were answered and there is agreement to proceed.  Consent signed and in chart.    Thank you for this interesting consult.  I greatly enjoyed meeting [employer] and look forward to participating in their care.  A copy of this report was sent to the requesting provider on this date.  Electronically Signed: Trenton Founds Boisseau 10/20/2020, 11:11 AM   I spent a total of  30 Minutes in face to face in clinical consultation, greater than 50% of which was counseling/coordinating care for right cervical node biopsy.

## 2020-10-20 NOTE — Procedures (Signed)
Pre Procedure Dx: Right sided neck mass Post Procedural Dx: Same  Technically successful US guided biopsy of indeterminate right sided neck mass  EBL: None  No immediate complications.   Ronny Bacon, MD Pager #: [phone]

## 2020-10-22 DIAGNOSIS — C109 Malignant neoplasm of oropharynx, unspecified: Secondary | ICD-10-CM | POA: Insufficient documentation

## 2020-10-22 DIAGNOSIS — C770 Secondary and unspecified malignant neoplasm of lymph nodes of head, face and neck: Secondary | ICD-10-CM | POA: Insufficient documentation

## 2020-10-23 ENCOUNTER — Other Ambulatory Visit: Payer: Self-pay | Admitting: Internal Medicine

## 2020-10-23 DIAGNOSIS — E119 Type 2 diabetes mellitus without complications: Secondary | ICD-10-CM

## 2020-10-23 NOTE — Telephone Encounter (Signed)
Please refill as per office routine med refill policy (all routine meds to be refilled for 3 mo or monthly (per pt preference) up to one year from last visit, then month to month grace period for 3 mo, then further med refills will have to be denied) ? ?

## 2020-10-27 ENCOUNTER — Other Ambulatory Visit: Payer: Self-pay | Admitting: Radiation Oncology

## 2020-10-27 ENCOUNTER — Other Ambulatory Visit: Payer: Self-pay | Admitting: Otolaryngology

## 2020-10-27 ENCOUNTER — Other Ambulatory Visit (HOSPITAL_COMMUNITY): Payer: Self-pay | Admitting: Otolaryngology

## 2020-10-27 ENCOUNTER — Ambulatory Visit
Admission: RE | Admit: 2020-10-27 | Discharge: 2020-10-27 | Disposition: A | Discharge status: Home / Self Care | Payer: Self-pay | Source: Ambulatory Visit | Attending: Radiation Oncology | Admitting: Radiation Oncology

## 2020-10-27 DIAGNOSIS — R221 Localized swelling, mass and lump, neck: Secondary | ICD-10-CM

## 2020-10-27 DIAGNOSIS — C770 Secondary and unspecified malignant neoplasm of lymph nodes of head, face and neck: Secondary | ICD-10-CM

## 2020-10-30 ENCOUNTER — Telehealth: Payer: Self-pay | Admitting: Hematology and Oncology

## 2020-10-30 NOTE — Telephone Encounter (Signed)
Scheduled appt per 9/9 referral. Pt is aware of appt date and time.

## 2020-11-06 LAB — SURGICAL PATHOLOGY

## 2020-11-11 ENCOUNTER — Other Ambulatory Visit: Payer: Self-pay

## 2020-11-11 ENCOUNTER — Ambulatory Visit (HOSPITAL_COMMUNITY)
Admission: RE | Admit: 2020-11-11 | Discharge: 2020-11-11 | Disposition: A | Discharge status: Home / Self Care | Payer: Medicare Other | Source: Ambulatory Visit | Attending: Otolaryngology | Admitting: Otolaryngology

## 2020-11-11 DIAGNOSIS — Z79899 Other long term (current) drug therapy: Secondary | ICD-10-CM | POA: Insufficient documentation

## 2020-11-11 DIAGNOSIS — C770 Secondary and unspecified malignant neoplasm of lymph nodes of head, face and neck: Secondary | ICD-10-CM | POA: Diagnosis not present

## 2020-11-11 LAB — GLUCOSE, CAPILLARY: Glucose-Capillary: 184 mg/dL — ABNORMAL HIGH (ref 70–99)

## 2020-11-11 MED ORDER — FLUDEOXYGLUCOSE F - 18 (FDG) INJECTION
10.4500 | Freq: Once | INTRAVENOUS | Status: AC | PRN
  Administered 2020-11-11: 10.28 via INTRAVENOUS

## 2020-11-13 NOTE — Progress Notes (Signed)
Head and Neck Cancer Location of Tumor / Histology:  Metastatic squamous cell carcinoma of hypopharynx, p16(+)  Patient presented with symptoms of: 09/11/1998 was evaluated by Dr. Melida Quitter because he noticed a knot in the upper right neck for the past couple of months. He denied any pain at the time, but did notices some difficulty swallowing  PET Scan 11/11/2020 --IMPRESSION: 1. Hypermetabolic mass in the RIGHT hypopharynx localizing to the RIGHT tonsillar pillar and vallecula. Solitary hypermetabolic metastatic lymph node to the RIGHT level II nodal station. No distant metastatic disease.  Biopsies revealed:  10/20/2020 FINAL MICROSCOPIC DIAGNOSIS:  A. LYMPH NODE, RIGHT NECK, NEEDLE CORE BIOPSY:  - Metastatic squamous cell carcinoma associated with scanty lymphoid tissue.  - See comment.  COMMENT:  There is sufficient tissue for additional testing.  ADDENDUM:  Immunohistochemistry for p16 is strongly positive  Nutrition Status Yes No Comments  Weight changes? []  [x]    Swallowing concerns? [x]  []  Reports occasional difficulty with solid foods. States food will get stuck in his throat, cause him to cough, and then he'll need to drink something to help food down  PEG? []  [x]     Referrals Yes No Comments  Social Work? [x]  []    Dentistry? [x]  []  Scheduled to see Dr. Sandi Mariscal 11/17/20  Swallowing therapy? [x]  []    Nutrition? [x]  []    Med/Onc? [x]  []     Safety Issues Yes No Comments  Prior radiation? []  [x]    Pacemaker/ICD? []  [x]    Possible current pregnancy? []  [x]  N/A  Is the patient on methotrexate? []  [x]     Tobacco/Marijuana/Snuff/ETOH use:  Patient is a former smoker and previous smokeless tobacco user (quit in 2008 and 2020 respectively). Denies any alcohol consumption or recreational drug use  Past/Anticipated interventions by otolaryngology, if any:  10/22/2020 Dr. Melida Quitter  Transnasal fiberoptic laryngoscopy  09/29/2020 Dr. Melida Quitter Fine needle  aspiration  Past/Anticipated interventions by medical oncology, if any:  Scheduled to see Dr. Heath Lark 11/17/2020   Current Complaints / other details:  Nothing else of note
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
## 2020-11-16 ENCOUNTER — Other Ambulatory Visit: Payer: Self-pay

## 2020-11-16 ENCOUNTER — Encounter: Payer: Self-pay | Admitting: Radiation Oncology

## 2020-11-16 ENCOUNTER — Ambulatory Visit
Admission: RE | Admit: 2020-11-16 | Discharge: 2020-11-16 | Disposition: A | Discharge status: Home / Self Care | Payer: Medicare Other | Source: Ambulatory Visit | Attending: Radiation Oncology | Admitting: Radiation Oncology

## 2020-11-16 VITALS — BP 111/79 | HR 99 | Temp 99.0°F | Resp 17 | Ht 72.0 in | Wt 204.1 lb

## 2020-11-16 DIAGNOSIS — Z87442 Personal history of urinary calculi: Secondary | ICD-10-CM | POA: Diagnosis not present

## 2020-11-16 DIAGNOSIS — Z7984 Long term (current) use of oral hypoglycemic drugs: Secondary | ICD-10-CM | POA: Diagnosis not present

## 2020-11-16 DIAGNOSIS — Z87891 Personal history of nicotine dependence: Secondary | ICD-10-CM | POA: Insufficient documentation

## 2020-11-16 DIAGNOSIS — C138 Malignant neoplasm of overlapping sites of hypopharynx: Secondary | ICD-10-CM

## 2020-11-16 DIAGNOSIS — E785 Hyperlipidemia, unspecified: Secondary | ICD-10-CM | POA: Diagnosis not present

## 2020-11-16 DIAGNOSIS — E119 Type 2 diabetes mellitus without complications: Secondary | ICD-10-CM | POA: Insufficient documentation

## 2020-11-16 DIAGNOSIS — Z7982 Long term (current) use of aspirin: Secondary | ICD-10-CM | POA: Diagnosis not present

## 2020-11-16 DIAGNOSIS — C140 Malignant neoplasm of pharynx, unspecified: Secondary | ICD-10-CM

## 2020-11-16 DIAGNOSIS — J384 Edema of larynx: Secondary | ICD-10-CM | POA: Diagnosis not present

## 2020-11-16 DIAGNOSIS — Z79899 Other long term (current) drug therapy: Secondary | ICD-10-CM | POA: Diagnosis not present

## 2020-11-16 DIAGNOSIS — I252 Old myocardial infarction: Secondary | ICD-10-CM | POA: Diagnosis not present

## 2020-11-16 NOTE — Progress Notes (Signed)
Oncology Nurse Navigator Documentation   Met with patient during initial consult with Dr. Squire. Introduced myself as his Navigator, explained my role as a member of the Care Team. Provided New Patient Information packet: Contact information for physician, this navigator, other members of the Care Team Advance Directive information ([facility] blue pamphlet with LCSW insert); provided [facility] AD booklet at his request,  Fall Prevention Patient Safety Plan Financial Assistance Information sheet Symptom Management Clinic information [facility] map with highlight of [facility] SLP Information sheet Provided and discussed educational handouts for PEG and PAC. Assisted with post-consult appt scheduling. I explained the location of Dr. Owsley's office as reference his future apt on 10/4 including arrival procedure for that appt. After his consult with Dr. Squire I met his nephew in the hallway and explained that we were referring him to the [facility] [facility] due to being closer to where he is living. I also answered his questions to the best of my ability. Mr. Chelf plans to be present for his consult with Dr. Owsley and Dr. Gorsuch tomorrow but would like to keep open the possibility of transferring care to [place].  I encouraged him and his nephew to call with questions/concerns moving forward.  Jennifer Malmfelt, RN, BSN, OCN Head & Neck Oncology Nurse Navigator [facility] [place] [phone]  

## 2020-11-17 ENCOUNTER — Inpatient Hospital Stay: Payer: Medicare Other

## 2020-11-17 ENCOUNTER — Other Ambulatory Visit: Payer: Self-pay

## 2020-11-17 ENCOUNTER — Ambulatory Visit (INDEPENDENT_AMBULATORY_CARE_PROVIDER_SITE_OTHER): Payer: Medicare Other | Admitting: Dentistry

## 2020-11-17 ENCOUNTER — Encounter (HOSPITAL_COMMUNITY): Payer: Self-pay | Admitting: Dentistry

## 2020-11-17 ENCOUNTER — Inpatient Hospital Stay: Payer: Medicare Other | Attending: Hematology and Oncology | Admitting: Hematology and Oncology

## 2020-11-17 ENCOUNTER — Encounter: Payer: Self-pay | Admitting: Hematology and Oncology

## 2020-11-17 DIAGNOSIS — K0889 Other specified disorders of teeth and supporting structures: Secondary | ICD-10-CM

## 2020-11-17 DIAGNOSIS — C760 Malignant neoplasm of head, face and neck: Secondary | ICD-10-CM | POA: Diagnosis present

## 2020-11-17 DIAGNOSIS — Z299 Encounter for prophylactic measures, unspecified: Secondary | ICD-10-CM | POA: Diagnosis not present

## 2020-11-17 DIAGNOSIS — K045 Chronic apical periodontitis: Secondary | ICD-10-CM

## 2020-11-17 DIAGNOSIS — A630 Anogenital (venereal) warts: Secondary | ICD-10-CM | POA: Insufficient documentation

## 2020-11-17 DIAGNOSIS — K029 Dental caries, unspecified: Secondary | ICD-10-CM

## 2020-11-17 DIAGNOSIS — Z23 Encounter for immunization: Secondary | ICD-10-CM | POA: Diagnosis not present

## 2020-11-17 DIAGNOSIS — C140 Malignant neoplasm of pharynx, unspecified: Secondary | ICD-10-CM

## 2020-11-17 DIAGNOSIS — K0530 Chronic periodontitis, unspecified: Secondary | ICD-10-CM

## 2020-11-17 DIAGNOSIS — K0850 Unsatisfactory restoration of tooth, unspecified: Secondary | ICD-10-CM

## 2020-11-17 DIAGNOSIS — E119 Type 2 diabetes mellitus without complications: Secondary | ICD-10-CM | POA: Insufficient documentation

## 2020-11-17 DIAGNOSIS — Z01818 Encounter for other preprocedural examination: Secondary | ICD-10-CM

## 2020-11-17 DIAGNOSIS — M26213 Malocclusion, Angle's class III: Secondary | ICD-10-CM

## 2020-11-17 DIAGNOSIS — Z972 Presence of dental prosthetic device (complete) (partial): Secondary | ICD-10-CM

## 2020-11-17 DIAGNOSIS — K08109 Complete loss of teeth, unspecified cause, unspecified class: Secondary | ICD-10-CM

## 2020-11-17 DIAGNOSIS — F40232 Fear of other medical care: Secondary | ICD-10-CM

## 2020-11-17 DIAGNOSIS — K036 Deposits [accretions] on teeth: Secondary | ICD-10-CM

## 2020-11-17 MED ORDER — INFLUENZA VAC A&B SA ADJ QUAD 0.5 ML IM PRSY
0.5000 mL | PREFILLED_SYRINGE | Freq: Once | INTRAMUSCULAR | Status: AC
  Administered 2020-11-17: 0.5 mL via INTRAMUSCULAR
  Filled 2020-11-17: qty 0.5

## 2020-11-17 NOTE — Patient Instructions (Signed)
Influenza Virus Vaccine injection What is this medication? INFLUENZA VIRUS VACCINE (in floo EN zuh VAHY ruhs vak SEEN) helps to reduce the risk of getting influenza also known as the flu. The vaccine only helps protect you against some strains of the flu. This medicine may be used for other purposes; ask your health care provider or pharmacist if you have questions. COMMON BRAND NAME(S): Afluria, Afluria Quadrivalent, Agriflu, Alfuria, FLUAD, FLUAD Quadrivalent, Fluarix, Fluarix Quadrivalent, Flublok, Flublok Quadrivalent, FLUCELVAX, FLUCELVAX Quadrivalent, Flulaval, Flulaval Quadrivalent, Fluvirin, Fluzone, Fluzone High-Dose, Fluzone Intradermal, Fluzone Quadrivalent What should I tell my care team before I take this medication? They need to know if you have any of these conditions: bleeding disorder like hemophilia fever or infection Guillain-Barre syndrome or other neurological problems immune system problems infection with the human immunodeficiency virus (HIV) or AIDS low blood platelet counts multiple sclerosis an unusual or allergic reaction to influenza virus vaccine, latex, other medicines, foods, dyes, or preservatives. Different brands of vaccines contain different allergens. Some may contain latex or eggs. Talk to your doctor about your allergies to make sure that you get the right vaccine. pregnant or trying to get pregnant breast-feeding How should I use this medication? This vaccine is for injection into a muscle or under the skin. It is given by a health care professional. A copy of Vaccine Information Statements will be given before each vaccination. Read this sheet carefully each time. The sheet may change frequently. Talk to your healthcare provider to see which vaccines are right for you. Some vaccines should not be used in all age groups. Overdosage: If you think you have taken too much of this medicine contact a poison control center or emergency room at once. NOTE: This  medicine is only for you. Do not share this medicine with others. What if I miss a dose? This does not apply. What may interact with this medication? chemotherapy or radiation therapy medicines that lower your immune system like etanercept, anakinra, infliximab, and adalimumab medicines that treat or prevent blood clots like warfarin phenytoin steroid medicines like prednisone or cortisone theophylline vaccines This list may not describe all possible interactions. Give your health care provider a list of all the medicines, herbs, non-prescription drugs, or dietary supplements you use. Also tell them if you smoke, drink alcohol, or use illegal drugs. Some items may interact with your medicine. What should I watch for while using this medication? Report any side effects that do not go away within 3 days to your doctor or health care professional. Call your health care provider if any unusual symptoms occur within 6 weeks of receiving this vaccine. You may still catch the flu, but the illness is not usually as bad. You cannot get the flu from the vaccine. The vaccine will not protect against colds or other illnesses that may cause fever. The vaccine is needed every year. What side effects may I notice from receiving this medication? Side effects that you should report to your doctor or health care professional as soon as possible: allergic reactions like skin rash, itching or hives, swelling of the face, lips, or tongue Side effects that usually do not require medical attention (report to your doctor or health care professional if they continue or are bothersome): fever headache muscle aches and pains pain, tenderness, redness, or swelling at the injection site tiredness Side effects that you should report to your doctor or health care professional as soon as possible: allergic reactions like skin rash, itching or hives, swelling of   the face, lips, or tongue Side effects that usually do not  require medical attention (report to your doctor or health care professional if they continue or are bothersome): fever headache muscle aches and pains pain, tenderness, redness, or swelling at the injection site tiredness This list may not describe all possible side effects. Call your doctor for medical advice about side effects. You may report side effects to FDA at 1-800-FDA-1088. Where should I keep my medication? The vaccine will be given by a health care professional in a clinic, pharmacy, doctor's office, or other health care setting. You will not be given vaccine doses to store at home. NOTE: This sheet is a summary. It may not cover all possible information. If you have questions about this medicine, talk to your doctor, pharmacist, or health care provider.  2022 Elsevier/Gold Standard (2019-10-08 19:49:22)  

## 2020-11-17 NOTE — Assessment & Plan Note (Signed)
We discussed the multidisciplinary approach to his disease The plan of care would be to proceed with concurrent chemoradiation therapy with weekly cisplatin Due to his social situation, I recommend the patient to receive his weekly cisplatin locally in [place]  He is in agreement I will reach out to my colleagues there He would need port placement, chemo education class, dietitian consult, speech and language therapist and physical therapy referral prior to starting treatment  We discussed the role of chemotherapy. The intent is of curative intent.  We discussed some of the risks, benefits, side-effects of cisplatin  Some of the short term side-effects included, though not limited to, including weight loss, life threatening infections, risk of allergic reactions, need for transfusions of blood products, nausea, vomiting, change in bowel habits, loss of hair, admission to hospital for various reasons, and risks of death.   Long term side-effects are also discussed including risks of infertility, permanent damage to nerve function, hearing loss, chronic fatigue, kidney damage with possibility needing hemodialysis, and rare secondary malignancy including bone marrow disorders.  The patient is aware that the response rates discussed earlier is not guaranteed.  After a long discussion, patient made an informed decision to proceed with the prescribed plan of care.   Patient education material was dispensed. The patient is currently scheduled for dental clearance It is important for him to proceed with dental extraction prior to port placement

## 2020-11-17 NOTE — Progress Notes (Signed)
David Kelly NOTE  Patient Care Team: Biagio Borg, MD as PCP - General (Internal Medicine) Landis Martins, DPM as Consulting Physician (Podiatry) Irine Seal, MD as Attending Physician (Urology) Malmfelt, Stephani Police, RN as Oncology Nurse Navigator Melida Quitter, MD as Consulting Physician (Otolaryngology) Heath Lark, MD as Consulting Physician (Hematology and Oncology) Eppie Gibson, MD as Consulting Physician (Radiation Oncology)  ASSESSMENT:  Newly diagnosed squamous cell carcinoma of the Head & Neck, HPV Positive Pharyngeal carcinoma, squamous cell (David Kelly) We discussed the multidisciplinary approach to his disease The plan of care would be to proceed with concurrent chemoradiation therapy with weekly cisplatin Due to his social situation, I recommend the patient to receive his weekly cisplatin locally in [place]  He is in agreement I will reach out to my colleagues there He would need port placement, chemo education class, dietitian consult, speech and language therapist and physical therapy referral prior to starting treatment  We discussed the role of chemotherapy. The intent is of curative intent.  We discussed some of the risks, benefits, side-effects of cisplatin  Some of the short term side-effects included, though not limited to, including weight loss, life threatening infections, risk of allergic reactions, need for transfusions of blood products, nausea, vomiting, change in bowel habits, loss of hair, admission to hospital for various reasons, and risks of death.   Long term side-effects are also discussed including risks of infertility, permanent damage to nerve function, hearing loss, chronic fatigue, kidney damage with possibility needing hemodialysis, and rare secondary malignancy including bone marrow disorders.  The patient is aware that the response rates discussed earlier is not guaranteed.  After a long discussion, patient made an informed  decision to proceed with the prescribed plan of care.   Patient education material was dispensed. The patient is currently scheduled for dental clearance It is important for him to proceed with dental extraction prior to port placement   Diabetes mellitus (David Kelly) He is getting better control of his diabetes recently We discussed importance of aggressive fluid hydration while on treatment He will continue close monitoring and follow-up with primary care doctor for management of his diabetes  Preventive measure We discussed the importance of preventive care and reviewed the vaccination programs. He does not have any prior allergic reactions to influenza vaccination. He agrees to proceed with influenza vaccination today and we will administer it today at the clinic.  All questions were answered. The patient knows to call the clinic with any problems, questions or concerns. I spent 60 minutes counseling the patient face to face.     Heath Lark, MD 11/17/20 1:52 PM   CHIEF COMPLAINTS/PURPOSE OF CONSULTATION:  HPV positive pharyngeal cancer, for further management  HISTORY OF PRESENTING ILLNESS:  David Kelly 75 y.o. male is here because of newly diagnosed head and neck cancer According to the patient, the first initial presentation was due to palpable swelling on the right side of his neck when he tried to shave he denies any hearing deficit, difficulties with chewing food, swallowing difficulties, painful swallowing, changes in the quality of his voice or abnormal weight loss. He has remote history of myocardial infarction but he denies chest pain or shortness of breath He has type 2 diabetes without peripheral neuropathy or renal complications  I have reviewed his electronic records extensively, collaborated the history with the patient and summarized as follows Oncology History Overview Note  Immunohistochemistry for p16 is strongly positive.    Pharyngeal carcinoma, squamous cell  (HCC)  09/10/2020 Initial Diagnosis   He presented with a palpable knot in his upper right neck to Dr. Redmond Baseman on 09/10/20. Patient denied any pain at that time, though did report some difficulty swallowing.     10/20/2020 Pathology Results   FINAL MICROSCOPIC DIAGNOSIS:   A. LYMPH NODE, RIGHT NECK, NEEDLE CORE BIOPSY:  - Metastatic squamous cell carcinoma associated with scanty lymphoid tissue.   10/20/2020 Procedure   Technically successful ultrasound guided biopsy of indeterminate right neck mass/nodal conglomeration.   10/22/2020 Procedure   The fiberoptic laryngoscope was then placed through the nasal passage to view the pharynx and larynx. After completion, the telescope was removed. Findings included normal nasal passages and no mass or abnormality in the nasopharynx. There is a granular mass involving the right lateral pharyngeal wall inferior to tonsillar area and extending to the midline of the vallecula. The larynx appears normal. Pyriform sinuses are open. Secretions are minimal. Vocal folds are without mass, scarring, or ulceration. The vocal folds adduct and abduct symmetrically. There is good glottal closure. Muscle tension patterns are not present. Laryngeal edema is minimal.    11/11/2020 PET scan   1. Hypermetabolic mass in the RIGHT hypopharynx localizing to the RIGHT tonsillar pillar and vallecula. 2. Solitary hypermetabolic metastatic lymph node to the RIGHT level II nodal station. 3. No distant metastatic disease.   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David Kelly)   11/16/2020 Cancer Staging   Staging form: Pharynx - HPV-Mediated Oropharynx, AJCC 8th Edition - Clinical stage from 11/16/2020: Stage I (cT2, cN1, cM0, p16+) - Signed by Eppie Gibson, MD on 11/16/2020 Stage prefix: Initial diagnosis      MEDICAL HISTORY:  Past Medical History:  Diagnosis Date   Allergy    Arthritis    Blood transfusion without reported diagnosis    with bleeding  colon per pt in the past    BPH (benign prostatic hypertrophy)    Diabetes mellitus    DX  5 YR AGO   Glaucoma    H/O: GI bleed    HLD (hyperlipidemia) 09/28/2017   Kidney stones    Myocardial infarction (David Kelly)    in the 1990's    SURGICAL HISTORY: Past Surgical History:  Procedure Laterality Date   COLONOSCOPY     CYSTOSCOPY WITH RETROGRADE PYELOGRAM, URETEROSCOPY AND STENT PLACEMENT Right 05/20/2016   Procedure: CYSTOSCOPY, URETEROSCOPY, TRANSURETHRAL INCISION OF URETHROCELE, BASKET STONE REMOVAL;  Surgeon: Irine Seal, MD;  Location: [facility];  Service: Urology;  Laterality: Right;   Gall Bladder STones removed     left total knee replacement     TOTAL KNEE ARTHROPLASTY  04/04/2011   Procedure: TOTAL KNEE ARTHROPLASTY;  Surgeon: Rudean Haskell, MD;  Location: [place];  Service: Orthopedics;  Laterality: Left;    SOCIAL HISTORY: Social History   Socioeconomic History   Marital status: Single    Spouse name: Not on file   Number of children: 4   Years of education: 11   Highest education level: Not on file  Occupational History   Occupation: retired  Tobacco Use   Smoking status: Former    Packs/day: 1.50    Years: 45.00    Pack years: 67.50    Types: Cigarettes    Quit date: 03/21/2006    Years since quitting: 14.6   Smokeless tobacco: Former    Types: Chew    Quit date: 09/14/2018  Vaping Use   Vaping Use: Never used  Substance and Sexual Activity   Alcohol  use: No   Drug use: No   Sexual activity: Not Currently  Other Topics Concern   Not on file  Social History Narrative   Fun: Fish and go to the park   Social Determinants of Health   Financial Resource Strain: Not on file  Food Insecurity: Not on file  Transportation Needs: Not on file  Physical Activity: Not on file  Stress: Not on file  Social Connections: Not on file  Intimate Partner Violence: Not on file    FAMILY HISTORY: Family History  Problem Relation Age of Onset   Arthritis Mother    Asthma  Paternal Grandfather    Diabetes Sister    Colon polyps Brother    Colon cancer Neg Hx    Esophageal cancer Neg Hx    Rectal cancer Neg Hx    Stomach cancer Neg Hx     ALLERGIES:  has No Known Allergies.  MEDICATIONS:  Current Outpatient Medications  Medication Sig Dispense Refill   ACCU-CHEK AVIVA PLUS test strip USE AS DIRECTED 100 each 12   aspirin EC 81 MG tablet Take 81 mg by mouth at bedtime.     atorvastatin (LIPITOR) 40 MG tablet Take 1 tablet (40 mg total) by mouth daily. 90 tablet 3   brimonidine-timolol (COMBIGAN) 0.2-0.5 % ophthalmic solution Place 1 drop into both eyes every 12 (twelve) hours. 15 mL 1   diclofenac sodium (VOLTAREN) 1 % GEL Apply 1 application topically 3 (three) times daily. Right knee and both shoulders. 3 Tube 4   Fluocinolone Acetonide 0.01 % OIL Place 5 drops in ear(s) 2 (two) times daily. 20 mL 1   fluticasone (FLONASE) 50 MCG/ACT nasal spray Place 2 sprays into both nostrils daily. 16 g 6   glipiZIDE (GLUCOTROL) 10 MG tablet Take 1 tablet (10 mg total) by mouth daily before breakfast. 90 tablet 3   levocetirizine (XYZAL) 5 MG tablet      metFORMIN (GLUCOPHAGE) 1000 MG tablet TAKE 1 TABLET BY MOUTH 2 TIMES DAILY WITH A MEAL 180 tablet 3   montelukast (SINGULAIR) 10 MG tablet Take 1 tablet (10 mg total) by mouth at bedtime. 90 tablet 3   Multiple Vitamin (MULTIVITAMIN WITH MINERALS) TABS tablet Take 1 tablet by mouth daily.     naproxen (NAPROSYN) 500 MG tablet Take by mouth.     Promethazine-Codeine 6.25-10 MG/5ML SOLN TAKE 5ML BY MOUTH EVERY 4 HOURS AS NEEDED FOR COUGH 180 mL 0   psyllium (METAMUCIL) 58.6 % packet Take 1 packet by mouth daily.     tamsulosin (FLOMAX) 0.4 MG CAPS capsule Take 1 capsule (0.4 mg total) by mouth at bedtime. 90 capsule 3   triamcinolone ointment (KENALOG) 0.5 % APPLY TO AFFECTED AREA(S) TWICE DAILY 30 g 2   TRUEplus Lancets 28G MISC USE AS DIRECTED 100 each 3   vitamin B-12 (CYANOCOBALAMIN) 1000 MCG tablet Take 1 tablet  (1,000 mcg total) by mouth daily. 90 tablet 3   No current facility-administered medications for this visit.    REVIEW OF SYSTEMS:   Constitutional: Denies fevers, chills or abnormal night sweats Eyes: Denies blurriness of vision, double vision or watery eyes Ears, nose, mouth, throat, and face: Denies mucositis or sore throat Respiratory: Denies cough, dyspnea or wheezes Cardiovascular: Denies palpitation, chest discomfort or lower extremity swelling Gastrointestinal:  Denies nausea, heartburn or change in bowel habits Skin: Denies abnormal skin rashes Lymphatics: Denies new lymphadenopathy or easy bruising Neurological:Denies numbness, tingling or new weaknesses Behavioral/Psych: Mood is stable, no new  changes  All other systems were reviewed with the patient and are negative.  PHYSICAL EXAMINATION: ECOG PERFORMANCE STATUS: 1 - Symptomatic but completely ambulatory  Vitals:   11/17/20 1307  BP: 135/67  Pulse: 97  Resp: 18  Temp: (!) 97.5 F (36.4 C)  SpO2: 100%   Filed Weights   11/17/20 1307  Weight: 204 lb 3.2 oz (92.6 kg)    GENERAL:alert, no distress and comfortable SKIN: skin color, texture, turgor are normal, no rashes or significant lesions EYES: normal, conjunctiva are pink and non-injected, sclera clear OROPHARYNX:no exudate, no erythema and lips, buccal mucosa, and tongue normal.  Noted poor dentition NECK: supple, thyroid normal size, non-tender, without nodularity LYMPH: He has palpable lymphadenopathy on the right side of his neck LUNGS: clear to auscultation and percussion with normal breathing effort HEART: regular rate & rhythm and no murmurs and no lower extremity edema ABDOMEN:abdomen soft, non-tender and normal bowel sounds Musculoskeletal:no cyanosis of digits and no clubbing  PSYCH: alert & oriented x 3 with fluent speech NEURO: no focal motor/sensory deficits  LABORATORY DATA:  I have reviewed the data as listed Lab Results  Component Value  Date   WBC 7.4 04/29/2020   HGB 12.2 (L) 04/29/2020   HCT 37.3 (L) 04/29/2020   MCV 82.7 04/29/2020   PLT 294.0 04/29/2020   Lab Results  Component Value Date   NA 138 04/29/2020   K 4.6 04/29/2020   CL 103 04/29/2020   CO2 26 04/29/2020    RADIOGRAPHIC STUDIES: I have personally reviewed the radiological images as listed and agreed with the findings in the report. NM PET Image Initial (PI) Skull Base To Thigh (F-18 FDG)  Result Date: 11/12/2020 CLINICAL DATA:  Subsequent treatment strategy for metastatic lymphadenopathy. Metastatic squamous cell carcinoma 2 cervical lymph node EXAM: NUCLEAR MEDICINE PET SKULL BASE TO THIGH TECHNIQUE: 10.3 mCi F-18 FDG was injected intravenously. Full-ring PET imaging was performed from the skull base to thigh after the radiotracer. CT data was obtained and used for attenuation correction and anatomic localization. Fasting blood glucose: 184 mg/dl COMPARISON:  CT neck 07/13/2020 FINDINGS: Mediastinal blood pool activity: SUV max 2.6 Liver activity: SUV max NA NECK: Intense asymmetric hypermetabolic activity in the RIGHT hypopharynx with SUV max equal 17.6. Hypermetabolic activity associated with thickened tissue in the region of the RIGHT tonsil and vallecula. Large RIGHT level II lymph node measures 3.2 cm with SUV max equal 14.4. No additional hypermetabolic lymph nodes or enlarged lymph nodes within the LEFT or RIGHT neck. Incidental CT findings: none CHEST: Normal mediastinum and cardiac silhouette. Normal pulmonary vasculature. No evidence of effusion, infiltrate, or pneumothorax. No acute bony abnormality. Incidental CT findings: none ABDOMEN/PELVIS: No abnormal hypermetabolic activity within the liver, pancreas, adrenal glands, or spleen. No hypermetabolic lymph nodes in the abdomen or pelvis. Incidental CT findings: none SKELETON: No focal hypermetabolic activity to suggest skeletal metastasis. Incidental CT findings: none IMPRESSION: 1. Hypermetabolic  mass in the RIGHT hypopharynx localizing to the RIGHT tonsillar pillar and vallecula. 2. Solitary hypermetabolic metastatic lymph node to the RIGHT level II nodal station. 3. No distant metastatic disease. Electronically Signed   By: Suzy Bouchard M.D.   On: 11/12/2020 08:24   [place] CORE BIOPSY (LYMPH NODES)  Result Date: 10/20/2020 INDICATION: Concern for head neck cancer, now with large right-sided nodal mass worrisome for metastatic disease. Please perform ultrasound-guided biopsy for tissue diagnostic purposes. EXAM: ULTRASOUND-GUIDED RIGHT NECK MASS BIOPSY COMPARISON:  Neck CT-07/13/2020 MEDICATIONS: None ANESTHESIA/SEDATION: Moderate (conscious) sedation was  employed during this procedure. A total of Versed 1 mg and Fentanyl 50 mcg was administered intravenously. Moderate Sedation Time: 14 minutes. The patient's level of consciousness and vital signs were monitored continuously by radiology nursing throughout the procedure under my direct supervision. COMPLICATIONS: None immediate. TECHNIQUE: Informed written consent was obtained from the patient after a discussion of the risks, benefits and alternatives to treatment. Questions regarding the procedure were encouraged and answered. Initial ultrasound scanning of the right-side of the neck demonstrated an enlarged at least 3.8 x 2.6 cm hypoechoic mass (image 3) correlating with the nodal conglomeration seen on preceding neck CT image 38, series 102. An ultrasound image was saved for documentation purposes. The procedure was planned. A timeout was performed prior to the initiation of the procedure. The operative was prepped and draped in the usual sterile fashion, and a sterile drape was applied covering the operative field. A timeout was performed prior to the initiation of the procedure. Local anesthesia was provided with 1% lidocaine with epinephrine. Under direct ultrasound guidance, an 18 gauge core needle device was utilized to obtain to obtain 8 core  needle biopsies of the right neck mass/nodal conglomeration. The samples were placed in saline and submitted to pathology. The needle was removed and hemostasis was achieved with manual compression. Post procedure scan was negative for significant hematoma. A dressing was placed. The patient tolerated the procedure well without immediate postprocedural complication. IMPRESSION: Technically successful ultrasound guided biopsy of indeterminate right neck mass/nodal conglomeration. Electronically Signed   By: Sandi Mariscal M.D.   On: 10/20/2020 15:25

## 2020-11-17 NOTE — Progress Notes (Signed)
Department of Dental Medicine             OUTPATIENT CONSULT   Service Date:   11/17/2020  Patient Name:   David Kelly Date of Birth:   07/01/1945 Medical Record Number: [id number]  Referring Provider:                Eppie Gibson, M.D.   PLAN/RECOMMENDATIONS:   Assessment There are no current signs of acute odontogenic infection including abscess, edema or erythema, or suspicious lesion requiring biopsy.   There are multiple teeth with severe decay, chronic periapical infections and periodontal concerns including loose teeth, accretions and severe bone loss.  Recommendations Extractions of all indicated teeth to decrease the risk of perioperative and postoperative systemic infection and complications.   Establish dental care at an outside office of the patient's choice for routine care including cleanings/periodontal therapy and periodic exams following radiation therapy.  Plan Discussed case with medical team.   The patient is scheduled to go to the operating room on Friday 10/7 for all indicated dental treatment to prepare him for radiation therapy.  Discussed in detail all treatment options and recommendations with the patient and they are agreeable to the plan.    Thank you for consulting with [facility] and for the opportunity to participate in this patient's treatment.  Should you have any questions or concerns, please contact the [facility] at [phone].   11/17/2020 Consult Note:  COVID-19 SCREENING:  The patient denies symptoms concerning for COVID-19 infection including fever, chills, cough, or newly developed shortness of breath.   HISTORY OF PRESENT ILLNESS: David Kelly is a very pleasant 75 y.o. male with h/o type 2 diabetes mellitus, osteoarthritis s/p total knee replacement, MI s/p stent placement in 05/2016, hyperlipidemia, kidney stones, BPH and glaucoma who was recently diagnosed with pharyngeal cancer and is anticipating  head and neck radiation.  The patient presents today for a medically necessary dental consultation as part of their pre-radiation therapy work-up.   DENTAL HISTORY: The patient reports that it has been a while since he has last seen a dentist. He says that he has several teeth pulled in the past and also had removable partial dentures made on the top and the bottom several years ago which no longer fit him well or have broken off.  He says that he went back to the dental office to try and have his partials fixed, but he did not appreciate their care at that time and so he elected not to return.  He currently denies any dental/orofacial pain or sensitivity. Patient is able to manage oral secretions.  Patient denies dysphagia, odynophagia, dysphonia, SOB and neck pain.  Patient denies fever, rigors and malaise.   CHIEF COMPLAINT:  Here for a pre-head and neck radiation dental exam.   Patient Active Problem List   Diagnosis Date Noted   Pharyngeal carcinoma, squamous cell ([place]) 11/16/2020   Vitamin D deficiency 04/30/2020   B12 deficiency 04/29/2020   Abscess of finger 09/06/2018   MI (myocardial infarction) ([place]) 12/11/2017   History of left knee replacement 12/11/2017   HLD (hyperlipidemia) 09/28/2017   Chronic cough 03/31/2017   Primary osteoarthritis of right knee 03/15/2017   Unspecified injury of lower back, sequela 03/15/2017   Bilateral rotator cuff syndrome 03/15/2017   Recurrent falls while walking 12/13/2016   Preventative health care 12/13/2016   Chronic pain 12/13/2016   Kidney stones    Glaucoma  Gastrointestinal bleed 06/28/2016   Dysuria 10/28/2015   Itching of ear 08/13/2015   Type 2 diabetes mellitus ([place]) 07/15/2015   Osteoarthritis 07/15/2015   Past Medical History:  Diagnosis Date   Allergy    Arthritis    Blood transfusion without reported diagnosis    with bleeding colon per pt in the past    BPH (benign prostatic hypertrophy)    Diabetes mellitus     DX  5 YR AGO   Glaucoma    H/O: GI bleed    HLD (hyperlipidemia) 09/28/2017   Kidney stones    Myocardial infarction ([place])    in the 1990's   Past Surgical History:  Procedure Laterality Date   COLONOSCOPY     CYSTOSCOPY WITH RETROGRADE PYELOGRAM, URETEROSCOPY AND STENT PLACEMENT Right 05/20/2016   Procedure: CYSTOSCOPY, URETEROSCOPY, TRANSURETHRAL INCISION OF URETHROCELE, BASKET STONE REMOVAL;  Surgeon: Irine Seal, MD;  Location: [facility];  Service: Urology;  Laterality: Right;   Gall Bladder STones removed     left total knee replacement     TOTAL KNEE ARTHROPLASTY  04/04/2011   Procedure: TOTAL KNEE ARTHROPLASTY;  Surgeon: Rudean Haskell, MD;  Location: [facility];  Service: Orthopedics;  Laterality: Left;   No Known Allergies Current Outpatient Medications  Medication Sig Dispense Refill   ACCU-CHEK AVIVA PLUS test strip USE AS DIRECTED 100 each 12   aspirin EC 81 MG tablet Take 81 mg by mouth at bedtime.     atorvastatin (LIPITOR) 40 MG tablet Take 1 tablet (40 mg total) by mouth daily. 90 tablet 3   brimonidine-timolol (COMBIGAN) 0.2-0.5 % ophthalmic solution Place 1 drop into both eyes every 12 (twelve) hours. 15 mL 1   diclofenac sodium (VOLTAREN) 1 % GEL Apply 1 application topically 3 (three) times daily. Right knee and both shoulders. 3 Tube 4   Fluocinolone Acetonide 0.01 % OIL Place 5 drops in ear(s) 2 (two) times daily. 20 mL 1   fluticasone (FLONASE) 50 MCG/ACT nasal spray Place 2 sprays into both nostrils daily. 16 g 6   glipiZIDE (GLUCOTROL) 10 MG tablet Take 1 tablet (10 mg total) by mouth daily before breakfast. 90 tablet 3   levocetirizine (XYZAL) 5 MG tablet      metFORMIN (GLUCOPHAGE) 1000 MG tablet TAKE 1 TABLET BY MOUTH 2 TIMES DAILY WITH A MEAL 180 tablet 3   montelukast (SINGULAIR) 10 MG tablet Take 1 tablet (10 mg total) by mouth at bedtime. 90 tablet 3   Multiple Vitamin (MULTIVITAMIN WITH MINERALS) TABS tablet Take 1 tablet by mouth daily.     naproxen (NAPROSYN)  500 MG tablet Take by mouth.     Promethazine-Codeine 6.25-10 MG/5ML SOLN TAKE 5ML BY MOUTH EVERY 4 HOURS AS NEEDED FOR COUGH 180 mL 0   psyllium (METAMUCIL) 58.6 % packet Take 1 packet by mouth daily.     tamsulosin (FLOMAX) 0.4 MG CAPS capsule Take 1 capsule (0.4 mg total) by mouth at bedtime. 90 capsule 3   triamcinolone ointment (KENALOG) 0.5 % APPLY TO AFFECTED AREA(S) TWICE DAILY 30 g 2   TRUEplus Lancets 28G MISC USE AS DIRECTED 100 each 3   vitamin B-12 (CYANOCOBALAMIN) 1000 MCG tablet Take 1 tablet (1,000 mcg total) by mouth daily. 90 tablet 3   No current facility-administered medications for this visit.    LABS: Lab Results  Component Value Date   WBC 7.4 04/29/2020   HGB 12.2 (L) 04/29/2020   HCT 37.3 (L) 04/29/2020   MCV 82.7 04/29/2020  PLT 294.0 04/29/2020      Component Value Date/Time   NA 138 04/29/2020 1501   K 4.6 04/29/2020 1501   CL 103 04/29/2020 1501   CO2 26 04/29/2020 1501   GLUCOSE 138 (H) 04/29/2020 1501   BUN 11 04/29/2020 1501   CREATININE 0.73 04/29/2020 1501   CALCIUM 9.9 04/29/2020 1501   GFRNONAA >60 05/17/2016 2106   GFRAA >60 05/17/2016 2106   Lab Results  Component Value Date   INR 1.06 03/22/2011   No results found for: PTT  Social History   Socioeconomic History   Marital status: Single    Spouse name: Not on file   Number of children: 4   Years of education: 11   Highest education level: Not on file  Occupational History   Occupation: retired  Tobacco Use   Smoking status: Former    Packs/day: 1.50    Years: 45.00    Pack years: 67.50    Types: Cigarettes    Quit date: 03/21/2006    Years since quitting: 14.6   Smokeless tobacco: Former    Types: Chew    Quit date: 09/14/2018  Vaping Use   Vaping Use: Never used  Substance and Sexual Activity   Alcohol use: No   Drug use: No   Sexual activity: Not Currently  Other Topics Concern   Not on file  Social History Narrative   Fun: Fish and go to the park   Social  Determinants of Radio broadcast assistant Strain: Not on file  Food Insecurity: Not on file  Transportation Needs: Not on file  Physical Activity: Not on file  Stress: Not on file  Social Connections: Not on file  Intimate Partner Violence: Not on file   Family History  Problem Relation Age of Onset   Arthritis Mother    Asthma Paternal Grandfather    Diabetes Sister    Colon polyps Brother    Colon cancer Neg Hx    Esophageal cancer Neg Hx    Rectal cancer Neg Hx    Stomach cancer Neg Hx      REVIEW OF SYSTEMS:  Reviewed with the patient as per HPI. Psych: (+) Dental phobia   VITAL SIGNS: BP (!) 147/82 (BP Location: Right Arm, Patient Position: Sitting, Cuff Size: Normal)   Pulse 77   Temp 98.5 F (36.9 C) (Oral)    PHYSICAL EXAM: General:  Well-developed, comfortable and in no apparent distress. Neurological:  Alert and oriented to person, place and  time. Extraoral:  Facial symmetry present without any edema or erythema.  No swelling or lymphadenopathy.  TMJ asymptomatic without clicks or crepitations.  Intraoral:  Soft tissues appear well-perfused and mucous membranes moist.  FOM and vestibules soft and not raised. Oral cavity without mass or lesion. No signs of infection, parulis, sinus tract, edema or erythema evident upon exam.   DENTAL EXAM: Hard tissue exam completed and charted.    Overall impression:  Poor remaining dentition.    Oral hygiene:  Poor   Periodontal:  Pink, healthy gingival tissue with blunted papilla with localized areas of inflamed and erythematous tissue.  Generalized calculus accumulation. (+) Mobility:  Class II- #6, #21, #29; Class III- #7, #11, #28 Caries:  #3, #6, #7, #11  Defective restorations:  #3, #7, #11, #19 existing restorations with recurrent decay Endodontics:  #9 previous RCT with full-coverage PFM crown Removable/fixed prosthodontics:  Existing upper and lower acrylic RPD; lower is broken and only has #23 and #  24 with  poor retention. Occlusion:  Class 3 malocclusion (+) Non-functional teeth: #3, #16, #18, #19, #20, #21, #28 (+) Supra-erupted teeth: #3, #16, #18, #19, #20, #21, #28  RADIOGRAPHIC EXAM:  PAN and Full Mouth Series exposed and interpreted.  Condyles seated bilaterally in fossas.  No evidence of abnormal pathology.  All visualized osseous structures appear WNL. #3, #16, #18, #19, #20, #28 supra-erupted. #16, #18 and #19 mesially inclined.   Localized severe horizontal bone loss consistent with severe periodontitis. Radiographic calculus evident.  Missing teeth, existing restorations.  #9 has existing endodontic therapy w/ gutta percha ~5 mm short of apex; #9 also has post and full-coverage crown.  Caries- #3 and #19 deep decay approximating the pulp; #6D, #20M, #11D.  Periapical radiolucencies noted on #3, #7, #16, #19 and #28.  #21 and #28 have widened PDL space and M&D vertical bone loss.   ASSESSMENT:  1.  Pharyngeal cancer, squamous cell carcinoma 2.  Pre-radiation dental exam 3.  Missing teeth 4.  Caries 5.  Chronic periodontitis 6.  Chronic apical periodontitis 7.  Accretions on teeth 8.  Loose teeth 9.  Defective restorations 10.  Malocclusion 11.  Ill-fitting prostheses 12.  Dental Phobia   PROCEDURES: The common and significant side effects of radiation therapy to the head and neck were explained and discussed with the patient.  The discussion included side effects of trismus (limited opening), dysgeusia (loss of taste), xerostomia (dry mouth), radiation caries and osteoradionecrosis of the jaw.  I also discussed the importance of maintaining optimal oral hygiene and oral health before, during and after radiation to decrease the risk of developing radiation cavities and the need for any surgery such as extractions after therapy.   PLAN AND RECOMMENDATIONS: I discussed the risks, benefits, and complications of various scenarios with the patient in relationship to their medical  and dental conditions, which included systemic infection or other serious issues such as osteoradionecrosis that could potentially occur either before, during or after their anticipated radiation therapy if dental/oral concerns are not addressed.  I explained that if any chronic or acute dental/oral infection(s) are addressed and subsequently not maintained following medical optimization and recovery, their risk of the previously mentioned complications are just as high and could potentially occur postoperatively.  I explained all significant findings of the dental consultation with the patient including multiple teeth that are very loose and have severe bone loss, chronically infected teeth and teeth with severe dental decay, and the recommended care including extractions of all indicated teeth in order to optimize them for radiation from a dental standpoint.  The patient verbalized understanding of all findings, discussion, and recommendations. We then discussed various treatment options to include no treatment, multiple extractions with alveoloplasty, pre-prosthetic surgery as indicated, periodontal therapy, dental restorations, root canal therapy, crown and bridge therapy, implant therapy, and replacement of missing teeth as indicated.  We discussed different treatment options including extractions of all remaining upper teeth to prepare him for a complete upper denture since the teeth he will have remaining after extracting indicated teeth for radiation would not be sufficient to make him a new partial denture vs extractions of only teeth necessary to clear him for radiation.  The patient verbalized understanding of all options, and currently wishes to proceed with extractions of all remaining upper teeth and lower teeth numbers 19 and 28 as well as any other teeth that are found to be infected or periodontally compromised at the time of dental surgery, with alveoloplasty and pre-prosthetic  surgery as  indicated. Plan to discuss all findings and recommendations with medical team and coordinate future care as needed.  Due to the urgency of optimizing the patient to begin radiation and his dental phobia, recommend all dental treatment be performed in the operating room under general anesthesia.  He is scheduled on Friday (10/7) for dental surgery. The patient will need to establish care at a dental office of his choice for routine dental care including replacement of missing teeth as needed, cleanings and exams after the completion of radiation therapy. PLEASE NOTE:  Patient says that the phone number to reach him at is [phone] which is his current cell phone.  All questions and concerns were invited and addressed.  The patient tolerated today's visit well and departed in stable condition.  I spent in excess of 120 minutes during the conduct of this consultation and >50% of this time involved direct face-to-face encounter for counseling and/or coordination of the patient's care.      [place], D.M.D.

## 2020-11-17 NOTE — Assessment & Plan Note (Signed)
He is getting better control of his diabetes recently We discussed importance of aggressive fluid hydration while on treatment He will continue close monitoring and follow-up with primary care doctor for management of his diabetes

## 2020-11-17 NOTE — Patient Instructions (Signed)
[facility] Department of Dental Medicine Arisbeth Purrington B. Huck Ashworth, D.M.D. Phone: [phone] Fax: [phone]   It was a pleasure seeing you today!  Please refer to the information below regarding your dental visit with us.  Call if you have any questions or concerns that come up after you leave.   Thank you for letting us provide care for you.  If there is anything we can do for you, please let us know.    RADIATION THERAPY AND INFORMATION REGARDING YOUR TEETH   XEROSTOMIA (DRY MOUTH):  Your salivary glands may be in the field of radiation.  Radiation may include all or only part of your salivary glands.  This will cause your saliva to dry up, and you will have a dry mouth.  The dry mouth will be for the rest of your life unless your radiation oncologist tells you otherwise.  Your saliva has many functions: It wets your tongue for speaking. It coats your teeth and the inside of your mouth for easier movement. It helps with chewing and swallowing food. It helps clean away harmful acid and toxic products made by the germs in your mouth, therefore it helps prevent cavities. It kills some germs in your mouth and helps to prevent gum disease. It helps to carry flavor to your taste buds.  Once you have lost your saliva, you will be at higher risk for tooth decay and gum disease.    What can be done to help improve your mouth when there's not enough saliva? Your dentist may give a recommendation for CLoSYS.  It will not bring back all of your saliva but may bring back some of it.  Also, your saliva may be thick and ropy or white and foamy.  It will not feel like it use to feel. You will need to swish with water every time your mouth feels dry.  YOU CANNOT suck on any cough drops, mints, lemon drops, candy, vitamin C or any other products.  You cannot use anything other than water to make your mouth feel less dry.  If you want to drink anything else, you have to drink it all at once and brush  afterwards.  Be sure to discuss the details of your diet habits with your dentist or hygienist.   RADIATION CARIES:  This is decay (cavities) that happens very quickly once your mouth is very dry due to radiation therapy.  Normally, cavities take six months to two years to become a problem.  When you have dry mouth, cavities may take as little as eight weeks to cause you a problem.    Dental check-ups every two months are necessary as long as you have a dry mouth. Radiation caries typically, but not always, start at your gum line where it is hard to see the cavity.  It is therefore also hard to fill these cavities adequately.  This high rate of cavities happens because your mouth no longer has saliva and therefore the acid made by the germs starts the decay process.  Whenever you eat anything the germs in your mouth change the food into acid.  The acid then burns a small hole in your tooth.  This small hole is the beginning of a cavity.  If this is not treated then it will grow bigger and become a cavity.  The way to avoid this hole getting bigger is to use fluoride every evening as prescribed by your dentist following your radiation. NOTE:  You have to make sure   that your teeth are very clean before you use the fluoride.  This fluoride in turn will strengthen your teeth and prepare them for another day of fighting acid. If you develop radiation caries many times, the damage is so large that you will have to have all your teeth removed.  This could be a big problem if some of these teeth are in the field of radiation.  Further details of why this could be a big problem will follow (see Osteoradionecrosis below).   DYSGEUSIA (LOSS OF TASTE):  This happens to varying degrees once you've had radiation therapy to your jaw region.  Many times taste is not completely lost, but becomes limited.  The loss of taste is mostly due to radiation affecting your taste buds.  However, if you have no saliva in your mouth  to carry the flavor to your taste buds, it would be difficult for your taste buds to taste anything.  That is why using water or a prescription for Salagen prior to meals and during meal times may help with some of the taste.  Keep in mind that taste generally returns very slowly over the course of several months or several years after radiation therapy.  Don't give up hope.   TRISMUS (LIMITED JAW OPENING):  According to your radiation oncologist, your TMJ or jaw joints are going to be partially or fully in the field of radiation.  This means that over time the muscles that help you open and close your mouth may get stiff.  This will potentially result in your not being able to open your mouth wide enough or as wide as you can open it now.    Let me give you an example of how slowly this happens and how unaware people are of it:   A gentlemen that had radiation therapy two years ago came back to me complaining that bananas are just too large for him to be able to fit them in between his teeth.  He was not able to open wide enough to bite into a banana.  This happens slowly and over a period of time.  What we do to try and prevent this:   Your dentist will probably give you a stack of sticks called a trismus exercise device.  This stack will help remind your muscles and your jaw joints to open up to the same distance every day.  Use these sticks every morning when you wake up, or according to the instructions given by your dentist.    You must use these sticks for at least one to two years after radiation therapy.  The reason for that is because it happens so slowly and keeps going on for about two years after radiation therapy.  Your hospital dentist will help you monitor your mouth opening and make sure that it's not getting smaller after radiation.  TRISMUS EXERCISES: Using the stack of sticks given to you by your dentist, place the stack in your mouth and hold onto the other end for support. Leave  the sticks in your mouth while holding the other end.  Allow 30 seconds for muscle stretching. Rest for a few seconds. Repeat 3-5 times. This exercise is recommended in the mornings and evenings unless otherwise instructed. The exercise should be done for a period of 2 YEARS after the end of radiation. Your maximum jaw opening should be checked regularly at recall dental visits by your general dentist. You should report any changes, soreness, or difficulties encountered   when doing the exercises to your dentist.   OSTEORADIONECROSIS (ORN):  This is a condition where your jaw bone after radiation therapy becomes very dry.  It has very little blood supply to keep it alive.  If you develop a cavity that turns into an abscess or an infection, then the jaw bone does not have enough blood supply to help fight the infection.  At this point it is very likely that the infection could cause the death of your jaw bone.  When you have dead bone it has to be removed.  Therefore, you might end up having to have surgery to remove part of your jaw bone, the part of the jaw bone that has been affected.     Healing is also a problem if you are to have surgery (like a tooth extraction) in the areas where the bone has had radiation therapy.  If you have surgery, you need more blood supply to heal which is not available.  When blood supply and oxygen are not available, there is a chance for the bone to die. Occasionally, ORN happens on its own with no obvious reason, but this is quite rare.  We believe that patients who continue to smoke and/or drink alcohol have a higher chance of having this problem. Once your jaw bone has had radiation therapy, if there are any remaining teeth in that area, it is not recommended to have them pulled unless your dentist or oral surgeon is aware of your history of radiation and believes it is safe.  The risks for ORN either from infection or spontaneously occurring (with no reason) are life  long.   QUESTIONS? Call our office during office hours at [phone].  

## 2020-11-17 NOTE — Assessment & Plan Note (Signed)
We discussed the importance of preventive care and reviewed the vaccination programs. He does not have any prior allergic reactions to influenza vaccination. He agrees to proceed with influenza vaccination today and we will administer it today at the clinic.  

## 2020-11-19 ENCOUNTER — Encounter (HOSPITAL_COMMUNITY): Payer: Self-pay | Admitting: Dentistry

## 2020-11-19 ENCOUNTER — Other Ambulatory Visit: Payer: Self-pay
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
## 2020-11-20 ENCOUNTER — Encounter (HOSPITAL_COMMUNITY): Payer: Self-pay | Admitting: Dentistry

## 2020-11-20 ENCOUNTER — Encounter (HOSPITAL_COMMUNITY)
Admission: RE | Disposition: A | Discharge status: Home / Self Care | Payer: Self-pay | Source: Home / Self Care | Attending: Dentistry

## 2020-11-20 ENCOUNTER — Ambulatory Visit (HOSPITAL_COMMUNITY): Payer: Medicare Other | Admitting: Anesthesiology

## 2020-11-20 ENCOUNTER — Telehealth: Payer: Self-pay | Admitting: Oncology

## 2020-11-20 ENCOUNTER — Other Ambulatory Visit: Payer: Self-pay

## 2020-11-20 ENCOUNTER — Ambulatory Visit (HOSPITAL_COMMUNITY)
Admission: RE | Admit: 2020-11-20 | Discharge: 2020-11-20 | Disposition: A | Discharge status: Home / Self Care | Payer: Medicare Other | Attending: Dentistry | Admitting: Dentistry

## 2020-11-20 DIAGNOSIS — Z7984 Long term (current) use of oral hypoglycemic drugs: Secondary | ICD-10-CM | POA: Insufficient documentation

## 2020-11-20 DIAGNOSIS — K045 Chronic apical periodontitis: Secondary | ICD-10-CM | POA: Diagnosis not present

## 2020-11-20 DIAGNOSIS — C140 Malignant neoplasm of pharynx, unspecified: Secondary | ICD-10-CM | POA: Diagnosis not present

## 2020-11-20 DIAGNOSIS — Z79899 Other long term (current) drug therapy: Secondary | ICD-10-CM | POA: Insufficient documentation

## 2020-11-20 DIAGNOSIS — K056 Periodontal disease, unspecified: Secondary | ICD-10-CM

## 2020-11-20 DIAGNOSIS — Z7982 Long term (current) use of aspirin: Secondary | ICD-10-CM | POA: Insufficient documentation

## 2020-11-20 DIAGNOSIS — Z87891 Personal history of nicotine dependence: Secondary | ICD-10-CM | POA: Insufficient documentation

## 2020-11-20 DIAGNOSIS — Z791 Long term (current) use of non-steroidal anti-inflammatories (NSAID): Secondary | ICD-10-CM | POA: Insufficient documentation

## 2020-11-20 DIAGNOSIS — K029 Dental caries, unspecified: Secondary | ICD-10-CM

## 2020-11-20 HISTORY — PX: MULTIPLE EXTRACTIONS WITH ALVEOLOPLASTY: SHX5342

## 2020-11-20 HISTORY — DX: Malignant (primary) neoplasm, unspecified: C80.1

## 2020-11-20 HISTORY — DX: Personal history of urinary calculi: Z87.442

## 2020-11-20 LAB — BASIC METABOLIC PANEL
Anion gap: 9 (ref 5–15)
BUN: 13 mg/dL (ref 8–23)
CO2: 23 mmol/L (ref 22–32)
Calcium: 9.1 mg/dL (ref 8.9–10.3)
Chloride: 102 mmol/L (ref 98–111)
Creatinine, Ser: 0.76 mg/dL (ref 0.61–1.24)
GFR, Estimated: >60 mL/min (ref >60)
Glucose, Bld: 164 mg/dL — ABNORMAL HIGH (ref 70–99)
Potassium: 3.9 mmol/L (ref 3.5–5.1)
Sodium: 134 mmol/L — ABNORMAL LOW (ref 135–145)

## 2020-11-20 LAB — CBC
HCT: 37.4 % — ABNORMAL LOW (ref 39.0–52.0)
Hemoglobin: 11.3 g/dL — ABNORMAL LOW (ref 13.0–17.0)
MCH: 25.9 pg — ABNORMAL LOW (ref 26.0–34.0)
MCHC: 30.2 g/dL (ref 30.0–36.0)
MCV: 85.6 fL (ref 80.0–100.0)
Platelets: 281 10*3/uL (ref 150–400)
RBC: 4.37 MIL/uL (ref 4.22–5.81)
RDW: 14.2 % (ref 11.5–15.5)
WBC: 7.1 10*3/uL (ref 4.0–10.5)
nRBC: 0 % (ref 0.0–0.2)

## 2020-11-20 LAB — GLUCOSE, CAPILLARY
Glucose-Capillary: 155 mg/dL — ABNORMAL HIGH (ref 70–99)
Glucose-Capillary: 137 mg/dL — ABNORMAL HIGH (ref 70–99)
Glucose-Capillary: 185 mg/dL — ABNORMAL HIGH (ref 70–99)

## 2020-11-20 SURGERY — MULTIPLE EXTRACTION WITH ALVEOLOPLASTY
Anesthesia: General | Site: Mouth

## 2020-11-20 MED ORDER — OXYCODONE HCL 5 MG PO TABS
5.0000 mg | ORAL_TABLET | Freq: Once | ORAL | Status: DC | PRN
Start: 2020-11-20 — End: 2020-11-20

## 2020-11-20 MED ORDER — HEMOSTATIC AGENTS (NO CHARGE) OPTIME
TOPICAL | Status: DC | PRN
  Administered 2020-11-20: 1 via TOPICAL

## 2020-11-20 MED ORDER — CEFAZOLIN SODIUM-DEXTROSE 2-4 GM/100ML-% IV SOLN
2.0000 g | INTRAVENOUS | Status: AC
  Administered 2020-11-20: 2 g via INTRAVENOUS
  Filled 2020-11-20: qty 100

## 2020-11-20 MED ORDER — SUGAMMADEX SODIUM 200 MG/2ML IV SOLN
INTRAVENOUS | Status: DC | PRN
Start: 2020-11-20 — End: 2020-11-20
  Administered 2020-11-20: 200 mg via INTRAVENOUS

## 2020-11-20 MED ORDER — LIDOCAINE-EPINEPHRINE 2 %-1:100000 IJ SOLN
INTRAMUSCULAR | Status: AC
  Filled 2020-11-20: qty 6.8

## 2020-11-20 MED ORDER — OXYCODONE HCL 5 MG/5ML PO SOLN: 5.0000 mg | Freq: Once | ORAL | Status: DC | PRN

## 2020-11-20 MED ORDER — AMOXICILLIN 500 MG PO CAPS: 500.0000 mg | ORAL_CAPSULE | Freq: Three times a day (TID) | ORAL | 0 refills | Status: AC

## 2020-11-20 MED ORDER — OXYMETAZOLINE HCL 0.05 % NA SOLN
NASAL | Status: DC | PRN
  Administered 2020-11-20 (×2): 2 via NASAL

## 2020-11-20 MED ORDER — CHLORHEXIDINE GLUCONATE 0.12 % MT SOLN
15.0000 mL | Freq: Once | OROMUCOSAL | Status: AC
  Administered 2020-11-20: 15 mL via OROMUCOSAL
  Filled 2020-11-20: qty 15

## 2020-11-20 MED ORDER — ACETAMINOPHEN 160 MG/5ML PO SOLN: 325.0000 mg | ORAL | Status: DC | PRN

## 2020-11-20 MED ORDER — DEXAMETHASONE SODIUM PHOSPHATE 10 MG/ML IJ SOLN
INTRAMUSCULAR | Status: DC | PRN
  Administered 2020-11-20: 10 mg via INTRAVENOUS

## 2020-11-20 MED ORDER — DEXMEDETOMIDINE (PRECEDEX) IN NS 20 MCG/5ML (4 MCG/ML) IV SYRINGE
PREFILLED_SYRINGE | INTRAVENOUS | Status: DC | PRN
  Administered 2020-11-20 (×2): 4 ug via INTRAVENOUS

## 2020-11-20 MED ORDER — MEPERIDINE HCL 25 MG/ML IJ SOLN
6.2500 mg | INTRAMUSCULAR | Status: DC | PRN
Start: 2020-11-20 — End: 2020-11-20

## 2020-11-20 MED ORDER — LIDOCAINE 2% (20 MG/ML) 5 ML SYRINGE
INTRAMUSCULAR | Status: AC
  Filled 2020-11-20: qty 5

## 2020-11-20 MED ORDER — 0.9 % SODIUM CHLORIDE (POUR BTL) OPTIME
TOPICAL | Status: DC | PRN
  Administered 2020-11-20: 1000 mL

## 2020-11-20 MED ORDER — ONDANSETRON HCL 4 MG/2ML IJ SOLN: 4.0000 mg | Freq: Once | INTRAMUSCULAR | Status: DC | PRN

## 2020-11-20 MED ORDER — LIDOCAINE-EPINEPHRINE 2 %-1:100000 IJ SOLN
INTRAMUSCULAR | Status: DC | PRN
  Administered 2020-11-20: 5.1 mL

## 2020-11-20 MED ORDER — FENTANYL CITRATE (PF) 250 MCG/5ML IJ SOLN
INTRAMUSCULAR | Status: AC
  Filled 2020-11-20: qty 5

## 2020-11-20 MED ORDER — FENTANYL CITRATE (PF) 100 MCG/2ML IJ SOLN
25.0000 ug | INTRAMUSCULAR | Status: DC | PRN
Start: 2020-11-20 — End: 2020-11-20
  Administered 2020-11-20: 25 ug via INTRAVENOUS

## 2020-11-20 MED ORDER — ROCURONIUM BROMIDE 10 MG/ML (PF) SYRINGE
PREFILLED_SYRINGE | INTRAVENOUS | Status: AC
  Filled 2020-11-20: qty 10

## 2020-11-20 MED ORDER — ONDANSETRON HCL 4 MG/2ML IJ SOLN
INTRAMUSCULAR | Status: AC
  Filled 2020-11-20: qty 2

## 2020-11-20 MED ORDER — ORAL CARE MOUTH RINSE: 15.0000 mL | Freq: Once | OROMUCOSAL | Status: AC

## 2020-11-20 MED ORDER — ROCURONIUM BROMIDE 10 MG/ML (PF) SYRINGE
PREFILLED_SYRINGE | INTRAVENOUS | Status: DC | PRN
  Administered 2020-11-20: 70 mg via INTRAVENOUS

## 2020-11-20 MED ORDER — PROPOFOL 10 MG/ML IV BOLUS
INTRAVENOUS | Status: AC
  Filled 2020-11-20: qty 20

## 2020-11-20 MED ORDER — ACETAMINOPHEN 325 MG PO TABS: 325.0000 mg | ORAL_TABLET | ORAL | Status: DC | PRN

## 2020-11-20 MED ORDER — FENTANYL CITRATE (PF) 100 MCG/2ML IJ SOLN
INTRAMUSCULAR | Status: AC
  Filled 2020-11-20: qty 2

## 2020-11-20 MED ORDER — LIDOCAINE 2% (20 MG/ML) 5 ML SYRINGE
INTRAMUSCULAR | Status: DC | PRN
  Administered 2020-11-20: 100 mg via INTRAVENOUS

## 2020-11-20 MED ORDER — DEXAMETHASONE SODIUM PHOSPHATE 10 MG/ML IJ SOLN
INTRAMUSCULAR | Status: AC
  Filled 2020-11-20: qty 1

## 2020-11-20 MED ORDER — PROPOFOL 10 MG/ML IV BOLUS
INTRAVENOUS | Status: DC | PRN
  Administered 2020-11-20: 150 mg via INTRAVENOUS
  Administered 2020-11-20: 50 mg via INTRAVENOUS

## 2020-11-20 MED ORDER — BUPIVACAINE-EPINEPHRINE (PF) 0.5% -1:200000 IJ SOLN
INTRAMUSCULAR | Status: AC
  Filled 2020-11-20: qty 3.6

## 2020-11-20 MED ORDER — LIDOCAINE-EPINEPHRINE 2 %-1:100000 IJ SOLN
INTRAMUSCULAR | Status: AC
  Filled 2020-11-20: qty 3.4

## 2020-11-20 MED ORDER — ONDANSETRON HCL 4 MG/2ML IJ SOLN
INTRAMUSCULAR | Status: DC | PRN
  Administered 2020-11-20: 4 mg via INTRAVENOUS

## 2020-11-20 MED ORDER — FENTANYL CITRATE (PF) 250 MCG/5ML IJ SOLN
INTRAMUSCULAR | Status: DC | PRN
  Administered 2020-11-20: 100 ug via INTRAVENOUS
  Administered 2020-11-20 (×4): 50 ug via INTRAVENOUS
  Administered 2020-11-20: 100 ug via INTRAVENOUS

## 2020-11-20 MED ORDER — LACTATED RINGERS IV SOLN: INTRAVENOUS | Status: DC

## 2020-11-20 SURGICAL SUPPLY — 37 items
ALCOHOL 70% 16 OZ (MISCELLANEOUS) ×2 IMPLANT
BAG COUNTER SPONGE SURGICOUNT (BAG) ×2 IMPLANT
BAG SPNG CNTER NS LX DISP (BAG) ×1
BLADE SURG 15 STRL LF DISP TIS (BLADE) ×1 IMPLANT
BLADE SURG 15 STRL SS (BLADE) ×2
COVER SURGICAL LIGHT HANDLE (MISCELLANEOUS) ×2 IMPLANT
GAUZE 4X4 16PLY ~~LOC~~+RFID DBL (SPONGE) ×2 IMPLANT
GAUZE PACKING FOLDED 2  STR (GAUZE/BANDAGES/DRESSINGS) ×2
GAUZE PACKING FOLDED 2 STR (GAUZE/BANDAGES/DRESSINGS) ×1 IMPLANT
GLOVE SURG ENC MOIS LTX SZ6.5 (GLOVE) ×2 IMPLANT
GLOVE SURG POLYISO LF SZ6 (GLOVE) ×2 IMPLANT
GOWN STRL REUS W/ TWL LRG LVL3 (GOWN DISPOSABLE) ×2 IMPLANT
GOWN STRL REUS W/TWL LRG LVL3 (GOWN DISPOSABLE) ×4
KIT BASIN OR (CUSTOM PROCEDURE TRAY) ×2 IMPLANT
KIT TURNOVER KIT B (KITS) ×2 IMPLANT
MANIFOLD NEPTUNE II (INSTRUMENTS) ×2 IMPLANT
NDL BLUNT 16X1.5 OR ONLY (NEEDLE) ×1 IMPLANT
NDL DENTAL 27 LONG (NEEDLE) ×2 IMPLANT
NEEDLE BLUNT 16X1.5 OR ONLY (NEEDLE) ×2 IMPLANT
NEEDLE DENTAL 27 LONG (NEEDLE) ×4 IMPLANT
NS IRRIG 1000ML POUR BTL (IV SOLUTION) ×2 IMPLANT
PACK EENT II TURBAN DRAPE (CUSTOM PROCEDURE TRAY) ×2 IMPLANT
PAD ARMBOARD 7.5X6 YLW CONV (MISCELLANEOUS) ×2 IMPLANT
SPONGE SURGIFOAM ABS GEL 100 (HEMOSTASIS) IMPLANT
SPONGE SURGIFOAM ABS GEL 12-7 (HEMOSTASIS) IMPLANT
SPONGE SURGIFOAM ABS GEL SZ50 (HEMOSTASIS) ×1 IMPLANT
SUCTION FRAZIER HANDLE 10FR (MISCELLANEOUS) ×2
SUCTION TUBE FRAZIER 10FR DISP (MISCELLANEOUS) ×1 IMPLANT
SUT CHROMIC 3 0 PS 2 (SUTURE) ×3 IMPLANT
SUT CHROMIC 4 0 P 3 18 (SUTURE) IMPLANT
SYR 50ML SLIP (SYRINGE) ×2 IMPLANT
SYR BULB IRRIG 60ML STRL (SYRINGE) ×2 IMPLANT
TOWEL GREEN STERILE FF (TOWEL DISPOSABLE) ×2 IMPLANT
TUBE CONNECTING 12X1/4 (SUCTIONS) ×2 IMPLANT
WATER STERILE IRR 1000ML POUR (IV SOLUTION) ×2 IMPLANT
WATER TABLETS ICX (MISCELLANEOUS) ×2 IMPLANT
YANKAUER SUCT BULB TIP NO VENT (SUCTIONS) ×2 IMPLANT

## 2020-11-20 NOTE — Telephone Encounter (Signed)
Scheduled appt per 10/4 staff msg from [facility]. Called pt, no answer. Left msg with appt date and time. Also left my direct phone number for pt to call back to confirm appt.

## 2020-11-20 NOTE — Transfer of Care (Signed)
Immediate Anesthesia Transfer of Care Note  Patient: David Kelly  Procedure(s) Performed: MULTIPLE EXTRACTION WITH ALVEOLOPLASTY (Mouth)  Patient Location: PACU  Anesthesia Type:General  Level of Consciousness: drowsy and patient cooperative  Airway & Oxygen Therapy: Patient Spontanous Breathing and Patient connected to face mask oxygen  Post-op Assessment: Report given to RN and Post -op Vital signs reviewed and stable  Post vital signs: Reviewed and stable  Last Vitals:  Vitals Value Taken Time  BP 138/81 11/20/20 1247  Temp    Pulse 93 11/20/20 1249  Resp 26 11/20/20 1249  SpO2 99 % 11/20/20 1249  Vitals shown include unvalidated device data.  Last Pain:  Vitals:   11/20/20 0727  TempSrc:   PainSc: 7          Complications: No notable events documented.

## 2020-11-20 NOTE — Anesthesia Postprocedure Evaluation (Signed)
Anesthesia Post Note  Patient: David Kelly  Procedure(s) Performed: MULTIPLE EXTRACTION WITH ALVEOLOPLASTY (Mouth)     Patient location during evaluation: PACU Anesthesia Type: General Level of consciousness: awake and alert Pain management: pain level controlled Vital Signs Assessment: post-procedure vital signs reviewed and stable Respiratory status: spontaneous breathing, nonlabored ventilation, respiratory function stable and patient connected to nasal cannula oxygen Cardiovascular status: blood pressure returned to baseline and stable Postop Assessment: no apparent nausea or vomiting Anesthetic complications: no   No notable events documented.  Last Vitals:  Vitals:   11/20/20 1302 11/20/20 1317  BP: 138/90 135/85  Pulse: 89 85  Resp: 20 17  Temp:  (!) 36.4 C  SpO2: 95% 93%    Last Pain:  Vitals:   11/20/20 1317  TempSrc:   PainSc: Asleep                 Vernadine Coombs

## 2020-11-20 NOTE — Discharge Instructions (Signed)
[facility] Miciah Covelli B. Emree Locicero, D.M.D. Phone: [phone] Fax: [phone]    MOUTH CARE AFTER SURGERY   FACTS: Ice used in ice bag helps keep the swelling down, and can help lessen the pain. It is easier to treat pain BEFORE it happens. Spitting disturbs the clot and may cause bleeding to start again, or to get worse. Smoking delays healing and can cause complications. Sharing prescriptions can be dangerous.  Do not take medications not recently prescribed for you. Antibiotics may stop birth control pills from working.  Use other means of birth control while on antibiotics. Warm salt water rinses after the first 24 hours will help lessen the swelling:  Use 1/2 teaspoonful of table salt per oz.of water.  DO NOT: Do not spit.   Do not drink through a straw. Strongly advised not to smoke, dip snuff or chew tobacco at least for 3 days. Do not eat sharp or crunchy foods.  Avoid the area of surgery when chewing. Do not stop your antibiotics before your instructions say to do so. Do not eat hot foods until bleeding has stopped.  If you need to, let your food cool down to room temperature.  EXPECT: Some swelling, especially first 2-3 days. Soreness or discomfort in varying degrees.  Follow your dentist's instructions about how to handle pain before it starts. Pinkish saliva or light blood in saliva, or on your pillow in the morning.  This can last around 24 hours. Bruising inside or outside the mouth.  This may not show up until 2-3 days after surgery.  Don't worry, it will go away in time. Pieces of "bone" may work themselves loose.  It's OK.  If they bother you, let us know.    WHAT TO DO IMMEDIATELY AFTER SURGERY: Bite on gauze with steady pressure for 30-45 minutes at a time.  Switch out the gauze after 30-45 minutes for clean gauze, and continue this for 1-2 hours or until bleeding subsides. Do not chew on the gauze. Do not lie down flat.  Raise  your head support especially for the first 24 hours. Apply ice to your face on the side of the surgery.  You may apply it 20 minutes on and a few minutes off.  Ice for 8-12 hours.  You may use ice up to 24 hours. Before the numbness wears off, take a pain pill as instructed. Prescription pain medication is not always required.  SWELLING: Expect swelling for the first couple of days.  It should get better after that. If swelling increases 3 days or so after surgery, let us know as soon as possible.  FEVER: Take Tylenol every 4 hours if needed to lower your temperature, especially if it is at 100F or higher. Drink lots of fluids. If the fever does not go away, let us know.  BREATHING TROUBLE: Any unusual difficulty breathing means you have to have someone bring you to the emergency room ASAP.  BLEEDING: Light oozing is expected for 24 hours or so. Prop head up with pillows. Do not spit. Do not confuse bright red fresh flowing blood with lots of saliva colored with a little bit of blood. If you notice some bleeding, place gauze or a tea bag where it is bleeding and apply CONSTANT pressure by biting down for 1 hour.  Avoid talking during this time.  Do not remove the gauze or tea bag during this hour to "check" the bleeding. If you notice bright RED bleeding FLOWING out   of particular area, and filling the floor of your mouth, put a wad of gauze on that area, bite down firmly and constantly.  Call us immediately.  If we're closed, have someone bring you to the emergency room.  ORAL HYGIENE: Brush your teeth as usual after meals and before bedtime. Use a soft toothbrush around the area of surgery. DO NOT AVOID BRUSHING.  Otherwise bacteria(germs) will grow and may delay healing or encourage infection. Since you cannot spit, just gently rinse and let the water flow out of your mouth. DO NOT SWISH HARD.  EATING: Cool liquids are a good point to start.  Increase to soft foods as  tolerated.   PRESCRIPTIONS: Follow the directions for your prescriptions exactly as written. If your doctor gave you a narcotic pain medication, do not drive, operate machinery or drink alcohol when on that medication.   QUESTIONS? Call our office during office hours [phone] or call the Emergency Room at [phone].  

## 2020-11-20 NOTE — Op Note (Signed)
Department of Dental Medicine          OPERATIVE REPORT   DATE OF SURGERY:   11/20/2020  PATIENT'S NAME:   David Kelly DATE OF BIRTH:   [birth year] MEDICAL RECORD NUMBER: [id number]  SURGEON:   Adair Lemar B. Benson Norway, D.M.D.  ASSISTANT:  Molli Posey, DAII   PREOPERATIVE DIAGNOSES:  Dental caries, periodontal disease  Patient Active Problem List   Diagnosis Date Noted   Preventive measure    Pharyngeal carcinoma, squamous cell ([place]) 11/16/2020   Vitamin D deficiency 04/30/2020   B12 deficiency 04/29/2020   Abscess of finger 09/06/2018   MI (myocardial infarction) ([place]) 12/11/2017   History of left knee replacement 12/11/2017   HLD (hyperlipidemia) 09/28/2017   Chronic cough 03/31/2017   Primary osteoarthritis of right knee 03/15/2017   Unspecified injury of lower back, sequela 03/15/2017   Bilateral rotator cuff syndrome 03/15/2017   Recurrent falls while walking 12/13/2016   Preventative health care 12/13/2016   Chronic pain 12/13/2016   Kidney stones    Glaucoma    Gastrointestinal bleed 06/28/2016   Dysuria 10/28/2015   Itching of ear 08/13/2015   Diabetes mellitus ([place]) 07/15/2015   Osteoarthritis 07/15/2015   POSTOPERATIVE DIAGNOSES:  Dental caries, periodontal disease  PROCEDURES PERFORMED: Extractions of teeth numbers 3, 4, 6, 7, 9, 11, 16, 19 and 28 2 Quadrants of alveoloplasty (UR and UL)  ANESTHESIA:  General anesthesia via nasal endotracheal tube.  MEDICATIONS: Ancef 2 g IV prior to invasive dental procedures. Local anesthesia with a total utilization of 3 cartridges of 34 mg of lidocaine with 0.018 mg of epinephrine/ea.  SPECIMENS:  9 teeth that were extracted and discarded  DRAINS/CULTURES:  None  COMPLICATIONS:  None  ESTIMATED BLOOD LOSS:  5 mL  INTRAVENOUS FLUIDS:  10 mL of Lactated ringers solution  INDICATIONS:   The patient was recently diagnosed with pharyngeal cancer and is anticipating head and neck radiation therapy.  A  medically necessary dental consult was then requested to evaluate the patient for any dental/orofacial infection and their overall oral health.  The patient was examined and subsequently treatment planned for multiple extractions of severely decayed and infected teeth.  This treatment plan was made to decrease the perioperative and postoperative risks and complications associated with dental/orofacial infection from affecting the patient's systemic health.  OPERATIVE FINDINGS:  The patient was examined in operating room number 10.  The indicated teeth were identified and verified for extraction. The patient was noted be affected by severe dental decay, accretions, loose teeth and chronic periodontitis.  DESCRIPTION OF PROCEDURE:  The patient was identified in the holding area and brought to the main operating room number 10 by the anesthesia team. The patient was then placed in the supine position on the operating table.  General anesthesia was then induced per the anesthesia team. The patient was then prepped and draped in the usual sterile fashion for dental medicine procedures.  A timeout was performed. The patient was identified and procedures were verified. A throat pack was placed at this time. The oral cavity was then thoroughly examined with the findings noted above.    The patient was then ready for the dental medicine procedure as follows:   ANESTHESIA: Local anesthesia was administered sequentially with a total utilization of 3 cartridges each containing 34 mg of lidocaine with 0.018 mg of epinephrine.  Location of anesthesia included- upper right and left infiltration, palatal and nasopalatine; lower right and left infiltration, lingual  and mental nerve block. Aspiration was negative.  ROUTINE EXTRACTIONS: The maxillary left and right quadrants were first approached. The teeth were then subluxated with a series of straight elevators.  Teeth numbers 3, 7 and 9 were then removed with a 150  forceps without complications.  Alveoloplasty was then performed utilizing a ronguers and bone file to eliminate infected tissue, sharp areas of bone and any areas of exposed bone.  The tissues were approximated and trimmed appropriately to help achieve primary closure.  The surgical sites were then curetted and irrigated with copious amounts of sterile saline.  Surgi Foam was placed in each extraction site.   The surgical sites were closed using 3-0 chromic gut sutures as follows: 1 figure-8 style, 1 simple interrupted.  The mandibular left and right quadrants were then approached. The teeth were subluxated with a series of straight elevators.  Teeth numbers 19 and 28 were then removed utilizing a 151 forceps and #23 cowhorn without complications. The tissues were approximated and trimmed appropriately to help achieve primary closure. The surgical sites were then curetted and irrigated with copious amounts of sterile saline.  Surgi Foam was placed in each extraction site.  The surgical sites were closed using 3-0 chromic gut sutures as follows: 1 figure-8 style, 1 simple interrupted.  SURGICAL EXTRACTIONS: Routine forceps extraction was attempted on teeth numbers 4, 6, 11 and 16 as described above.  Upon attempting forceps extraction, teeth numbers 4, 6, 11 and 16 broke off to the gingival margin.  A 15 blade incision was then made from the distal of #4 to the distal of #6 and a sulcular incision was made on #11 and #16 for better access and visualization.  A FTMP flap was then carefully reflected with the periosteal elevator.  A surgical handpiece was used with copious amounts of sterile irrigation to remove mesial/distal/buccal/palatal bone as necessary.  Teeth numbers 4, 6, 11 and 16 and all root tips were extracted using rongeurs, east/west elevators and Molt 9 curette without complications.  Alveoloplasty was performed utilizing a rongeurs and bone file as described above.  The tissues were approximated  and trimmed appropriately to help achieve primary closure.  The surgical sites were then curetted and irrigated with copious amounts of sterile saline.  Surgi Foam was placed in each extraction site.  The surgical sites were closed using 3-0 chromic gut sutures as follows: 1 figure-8 style, 2 simple interrupted.   END OF PROCEDURE: Thorough oral irrigation with sterile saline was performed.  Good hemostasis was observed.  The patient was examined for complications, and seeing none, the dental medicine procedure was deemed to be complete.  The throat pack was removed at this time. An oral airway was then placed at the request of the anesthesia team.  A series of 4x4 gauze were placed in the mouth to aid hemostasis as needed.  The patient was then handed over to the anesthesia team for final disposition.  After an appropriate amount of time, the patient was extubated and taken to the postanesthsia care unit in stable condition.  All counts were correct for the dental medicine procedure.       [place] Benson Norway, D.M.D.

## 2020-11-20 NOTE — Interval H&P Note (Signed)
History and Physical Interval Note:  11/20/2020 10:04 AM  [name]  has presented today for surgery, with the diagnosis of dental caries and periodontal disease.  The various methods of treatment have been discussed with the patient and family. After consideration of risks, benefits and other options for treatment, the patient has consented to  Procedure(s): MULTIPLE EXTRACTION WITH ALVEOLOPLASTY (N/A) as a surgical intervention.  The patient's history has been reviewed, patient examined, no change in status, stable for surgery.  I have reviewed the patient's chart and labs.  Questions were answered to the patient's satisfaction.     [name]

## 2020-11-20 NOTE — Progress Notes (Signed)
       Department of Dental Medicine      PREOPERATIVE NOTE   SERVICE DATE:   11/20/2020  PATIENT'S NAME:   David Kelly MEDICAL RECORD NUMBER:  [id number]   David Kelly presents today for dental procedures in the operating room. The patient denies any acute medical or dental changes and agrees to proceed with treatment as planned.   VITALS: BP (!) 163/78   Pulse 72   Temp 98.4 F (36.9 C) (Oral)   Resp 17   Ht 6' (1.829 m)   Wt 92.5 kg   SpO2 99%   BMI 27.67 kg/m    LABS: Lab Results  Component Value Date   WBC 7.1 11/20/2020   HGB 11.3 (L) 11/20/2020   HCT 37.4 (L) 11/20/2020   MCV 85.6 11/20/2020   PLT 281 11/20/2020   BMET    Component Value Date/Time   NA 134 (L) 11/20/2020 0800   K 3.9 11/20/2020 0800   CL 102 11/20/2020 0800   CO2 23 11/20/2020 0800   GLUCOSE 164 (H) 11/20/2020 0800   BUN 13 11/20/2020 0800   CREATININE 0.76 11/20/2020 0800   CALCIUM 9.1 11/20/2020 0800   GFRNONAA >60 11/20/2020 0800    Lab Results  Component Value Date   INR 1.06 03/22/2011   No results found for: PTT   EXAM: No signs of acute dental changes.   ASSESSMENT: Dental caries Periodontitis, loose teeth, accretions Chronic apical periodontitis   PLAN: The patient agrees to proceed with dental treatment in the operating room as previously discussed and planned, which includes extractions of all remaining maxillary teeth and mandibular teeth #19 and #28 with the potential for more depending on evaluation during dental surgery in the OR (#18, #21, #25 and #26 are questionable and will be re-assessed).  The patient accepts the risks, benefits and complications of the proposed treatment.   The patient is aware of the risk for bleeding, bruising, swelling, infection, pain, nerve damage, sinus involvement, root tip fracture, mandible fracture and the risks of complications associated with the anesthesia.  The patient also is aware of the potential for other  complications not mentioned above.   [place] Benson Norway, D.M.D.

## 2020-11-20 NOTE — Anesthesia Procedure Notes (Signed)
Procedure Name: Intubation Date/Time: 11/20/2020 10:28 AM Performed by: Lowella Dell, CRNA Pre-anesthesia Checklist: Patient identified, Emergency Drugs available, Suction available and Patient being monitored Patient Re-evaluated:Patient Re-evaluated prior to induction Oxygen Delivery Method: Circle System Utilized Preoxygenation: Pre-oxygenation with 100% oxygen Induction Type: IV induction Ventilation: Mask ventilation without difficulty Laryngoscope Size: Mac and 4 Grade View: Grade II Nasal Tubes: Nasal Rae, Nasal prep performed and Magill forceps- large, utilized Tube size: 8.0 mm Number of attempts: 1 Placement Confirmation: ETT inserted through vocal cords under direct vision, positive ETCO2 and breath sounds checked- equal and bilateral Secured at: 30 cm Tube secured with: Tape Dental Injury: Teeth and Oropharynx as per pre-operative assessment

## 2020-11-20 NOTE — Anesthesia Preprocedure Evaluation (Addendum)
Anesthesia Evaluation  Patient identified by MRN, date of birth, ID band Patient awake    Reviewed: Allergy & Precautions, NPO status , Patient's Chart, lab work & pertinent test results  Airway Mallampati: I  TM Distance: >3 FB    Comment: H/o Pharyngeal carcinoma, squamous cell  Dental  (+) Partial Lower, Partial Upper, Missing, Dental Advisory Given   Pulmonary former smoker,    breath sounds clear to auscultation       Cardiovascular + Past MI   Rhythm:Regular Rate:Normal     Neuro/Psych negative neurological ROS  negative psych ROS   GI/Hepatic negative GI ROS, Neg liver ROS,   Endo/Other  diabetes, Type 2, Oral Hypoglycemic Agents  Renal/GU Renal disease  negative genitourinary   Musculoskeletal  (+) Arthritis ,   Abdominal   Peds negative pediatric ROS (+)  Hematology negative hematology ROS (+)   Anesthesia Other Findings   Reproductive/Obstetrics negative OB ROS                             Lab Results  Component Value Date   WBC 7.4 04/29/2020   HGB 12.2 (L) 04/29/2020   HCT 37.3 (L) 04/29/2020   MCV 82.7 04/29/2020   PLT 294.0 04/29/2020   Lab Results  Component Value Date   CREATININE 0.73 04/29/2020   BUN 11 04/29/2020   NA 138 04/29/2020   K 4.6 04/29/2020   CL 103 04/29/2020   CO2 26 04/29/2020   Lab Results  Component Value Date   INR 1.06 03/22/2011   EKG: sinus tachycardia.  Anesthesia Physical  Anesthesia Plan  ASA: 3  Anesthesia Plan: General   Post-op Pain Management:    Induction: Intravenous  PONV Risk Score and Plan: 2 and Ondansetron and Treatment may vary due to age or medical condition  Airway Management Planned: Oral ETT and Nasal ETT  Additional Equipment: None  Intra-op Plan:   Post-operative Plan: Extubation in OR  Informed Consent: I have reviewed the patients History and Physical, chart, labs and discussed the procedure  including the risks, benefits and alternatives for the proposed anesthesia with the patient or authorized representative who has indicated his/her understanding and acceptance.     Dental advisory given  Plan Discussed with: CRNA and Anesthesiologist  Anesthesia Plan Comments:       Anesthesia Quick Evaluation

## 2020-11-20 NOTE — Progress Notes (Signed)
Pt in phase II waiting for ride. Vital signs stable. No further monitoring necessary.   Gabriel Rainwater, RN

## 2020-11-21 ENCOUNTER — Encounter (HOSPITAL_COMMUNITY): Payer: Self-pay | Admitting: Dentistry

## 2020-11-30 ENCOUNTER — Other Ambulatory Visit: Payer: Self-pay

## 2020-11-30 ENCOUNTER — Encounter (HOSPITAL_COMMUNITY): Payer: Self-pay | Admitting: Dentistry

## 2020-11-30 ENCOUNTER — Ambulatory Visit (INDEPENDENT_AMBULATORY_CARE_PROVIDER_SITE_OTHER): Payer: Medicare Other | Admitting: Dentistry

## 2020-11-30 VITALS — BP 124/75 | HR 79 | Temp 97.9°F

## 2020-11-30 DIAGNOSIS — K083 Retained dental root: Secondary | ICD-10-CM | POA: Diagnosis not present

## 2020-11-30 DIAGNOSIS — K08199 Complete loss of teeth due to other specified cause, unspecified class: Secondary | ICD-10-CM

## 2020-11-30 NOTE — Progress Notes (Signed)
Department of Dental Medicine            POSTOPERATIVE VISIT   Service Date:   11/30/2020  Patient Name:   David Kelly Date of Birth:   March 03, 1945 Medical Record Number: [id number]   PLAN/RECOMMENDATIONS:   Assessment The patient continues to heal well and consistent with dental procedures performed.  Recommendations Establish dental care at an outside office of the patient's choice for routine care including cleanings/periodontal therapy and periodic exams, or return to our hospital dental clinic for routine care.  Plan Follow-up after the completion of radiation therapy. Call if any questions or concerns arise.  Discussed in detail all treatment options and recommendations with the patient and they are agreeable to the plan.   11/30/2020 Postoperative Note:  COVID-19 SCREENING:  The patient denies symptoms concerning for COVID-19 infection including fever, chills, cough, or newly developed shortness of breath.   HISTORY OF PRESENT ILLNESS: David Kelly presents today for a postoperative visit s/p multiple extractions with alveoloplasty in the operating room on 10/7.   Medical and dental history reviewed with the patient.  His radiation simulation date/start date are not scheduled as of yet.   CHIEF COMPLAINT:   Patient with no complaints.  He reports having no significant issues or pain following dental surgery.   Patient Active Problem List   Diagnosis Date Noted   Preventive measure    Pharyngeal carcinoma, squamous cell ([place]) 11/16/2020   Vitamin D deficiency 04/30/2020   B12 deficiency 04/29/2020   Abscess of finger 09/06/2018   MI (myocardial infarction) ([place]) 12/11/2017   History of left knee replacement 12/11/2017   HLD (hyperlipidemia) 09/28/2017   Chronic cough 03/31/2017   Primary osteoarthritis of right knee 03/15/2017   Unspecified injury of lower back, sequela 03/15/2017   Bilateral rotator cuff syndrome 03/15/2017   Recurrent falls  while walking 12/13/2016   Preventative health care 12/13/2016   Chronic pain 12/13/2016   Kidney stones    Glaucoma    Gastrointestinal bleed 06/28/2016   Dysuria 10/28/2015   Itching of ear 08/13/2015   Diabetes mellitus ([place]) 07/15/2015   Osteoarthritis 07/15/2015   Past Medical History:  Diagnosis Date   Allergy    Arthritis    Blood transfusion without reported diagnosis    with bleeding colon per pt in the past    BPH (benign prostatic hypertrophy)    Cancer ([place])    head/neck   Diabetes mellitus    DX  5 YR AGO  Type II   Glaucoma    H/O: GI bleed    History of kidney stones    HLD (hyperlipidemia) 09/28/2017   Myocardial infarction ([place])    in the 1990's   Past Surgical History:  Procedure Laterality Date   COLONOSCOPY     CYSTOSCOPY WITH RETROGRADE PYELOGRAM, URETEROSCOPY AND STENT PLACEMENT Right 05/20/2016   Procedure: CYSTOSCOPY, URETEROSCOPY, TRANSURETHRAL INCISION OF URETHROCELE, BASKET STONE REMOVAL;  Surgeon: Irine Seal, MD;  Location: [facility];  Service: Urology;  Laterality: Right;   Gall Bladder STones removed     left total knee replacement     MULTIPLE EXTRACTIONS WITH ALVEOLOPLASTY N/A 11/20/2020   Procedure: MULTIPLE EXTRACTION WITH ALVEOLOPLASTY;  Surgeon: [name], DMD;  Location: [facility];  Service: Dentistry;  Laterality: N/A;   TOTAL KNEE ARTHROPLASTY  04/04/2011   Procedure: TOTAL KNEE ARTHROPLASTY;  Surgeon: Rudean Haskell, MD;  Location: [facility];  Service: Orthopedics;  Laterality:  Left;   Current Outpatient Medications  Medication Sig Dispense Refill   ACCU-CHEK AVIVA PLUS test strip USE AS DIRECTED 100 each 12   aspirin EC 81 MG tablet Take 81 mg by mouth at bedtime.     atorvastatin (LIPITOR) 40 MG tablet Take 1 tablet (40 mg total) by mouth daily. 90 tablet 3   brimonidine-timolol (COMBIGAN) 0.2-0.5 % ophthalmic solution Place 1 drop into both eyes every 12 (twelve) hours. 15 mL 1   diclofenac sodium (VOLTAREN) 1 % GEL Apply 1  application topically 3 (three) times daily. Right knee and both shoulders. 3 Tube 4   Fluocinolone Acetonide 0.01 % OIL Place 5 drops in ear(s) 2 (two) times daily. 20 mL 1   fluticasone (FLONASE) 50 MCG/ACT nasal spray Place 2 sprays into both nostrils daily. 16 g 6   glipiZIDE (GLUCOTROL) 10 MG tablet Take 1 tablet (10 mg total) by mouth daily before breakfast. 90 tablet 3   levocetirizine (XYZAL) 5 MG tablet      metFORMIN (GLUCOPHAGE) 1000 MG tablet TAKE 1 TABLET BY MOUTH 2 TIMES DAILY WITH A MEAL 180 tablet 3   montelukast (SINGULAIR) 10 MG tablet Take 1 tablet (10 mg total) by mouth at bedtime. 90 tablet 3   Multiple Vitamin (MULTIVITAMIN WITH MINERALS) TABS tablet Take 1 tablet by mouth daily.     naproxen (NAPROSYN) 500 MG tablet Take by mouth.     Promethazine-Codeine 6.25-10 MG/5ML SOLN TAKE 5ML BY MOUTH EVERY 4 HOURS AS NEEDED FOR COUGH 180 mL 0   psyllium (METAMUCIL) 58.6 % packet Take 1 packet by mouth daily.     tamsulosin (FLOMAX) 0.4 MG CAPS capsule Take 1 capsule (0.4 mg total) by mouth at bedtime. 90 capsule 3   triamcinolone ointment (KENALOG) 0.5 % APPLY TO AFFECTED AREA(S) TWICE DAILY 30 g 2   TRUEplus Lancets 28G MISC USE AS DIRECTED 100 each 3   vitamin B-12 (CYANOCOBALAMIN) 1000 MCG tablet Take 1 tablet (1,000 mcg total) by mouth daily. 90 tablet 3   No current facility-administered medications for this visit.   No Known Allergies   LABS: Lab Results  Component Value Date   WBC 7.1 11/20/2020   HGB 11.3 (L) 11/20/2020   HCT 37.4 (L) 11/20/2020   MCV 85.6 11/20/2020   PLT 281 11/20/2020   BMET    Component Value Date/Time   NA 134 (L) 11/20/2020 0800   K 3.9 11/20/2020 0800   CL 102 11/20/2020 0800   CO2 23 11/20/2020 0800   GLUCOSE 164 (H) 11/20/2020 0800   BUN 13 11/20/2020 0800   CREATININE 0.76 11/20/2020 0800   CALCIUM 9.1 11/20/2020 0800   GFRNONAA >60 11/20/2020 0800    Lab Results  Component Value Date   INR 1.06 03/22/2011   No results  found for: PTT   VITALS: BP 124/75 (BP Location: Right Arm, Patient Position: Sitting, Cuff Size: Normal)   Pulse 79   Temp 97.9 F (36.6 C) (Oral)    EXAM: Extraction sites appear to be healing WNL.  No signs of wound dehiscence or infection evident upon examination. Sutures remain in-tact on the lower right quadrant; will leave in place to aid in healing until they fall out on their own.   RADIOGRAPHIC EXAM: Periapical image exposed and interpreted (indicated due to complexity of the extraction, surrounding anatomical structures and abnormal tooth anatomy).  Small root tip notable in #16 extraction site; maxillary sinus wall appears in-tact & there is no abnormal pathology noted.  ASSESSMENT:   Postoperative course is consistent with dental procedures performed. #16 retained tooth root.   PROCEDURES: The patient was given a chlorhexidine gluconate rinse for 30 seconds.  Extraction sites were irrigated with sterile saline and syringe to remove debris.   PLAN AND RECOMMENDATIONS: Follow-up s/p radiation therapy (scheduled today).  Discussed with the patient that there is still a small root tip remaining on the upper left extraction site (#16).  Explained that the tooth was very difficult to remove due to access and proximity of the maxillary sinus, and although it appeared all of the tooth was removed during surgery we took an Xray today to verify this.  Explained that the root tip will likely work its way out on its own over time, however I would recommend removal of it once he completes his cancer treatment since there are signs of radiolucency surrounding the tooth roots.  The tooth is not acutely infected, symptomatic or causing any discomfort/purulence clinically, so recommend waiting until after his cancer therapy to address this so as not to further delay his treatment.  In addition, per Dr. Isidore Moos,  she anticipates his tooth roots will not receive >50 Gy.  Instructed the  patient to call should he develop any new onset of symptoms or has any questions/concerns before his next dental visit with [place].  He verbalized understanding and is agreeable to the plan. Establish care at an outside dental office for routine dental care including replacement of missing teeth as needed, cleanings/periodontal therapy and exams. Call if any questions or concerns arise.  All questions and concerns were invited and addressed.  The patient tolerated today's visit well and departed in stable condition.       [facility] [place], D.M.D.

## 2020-12-01 DIAGNOSIS — K08199 Complete loss of teeth due to other specified cause, unspecified class: Secondary | ICD-10-CM | POA: Insufficient documentation

## 2020-12-01 DIAGNOSIS — K083 Retained dental root: Secondary | ICD-10-CM | POA: Insufficient documentation

## 2020-12-02 ENCOUNTER — Other Ambulatory Visit: Payer: Self-pay | Admitting: Oncology

## 2020-12-02 DIAGNOSIS — C140 Malignant neoplasm of pharynx, unspecified: Secondary | ICD-10-CM

## 2020-12-02 NOTE — Progress Notes (Signed)
[facility]  [address] [place],  [place]  [address] [phone]  Clinic Day:  12/03/2020  Referring physician: Biagio Borg, MD  This document serves as a record of services personally performed by David Poisson, MD. It was created on their behalf by David Kelly, a trained medical scribe. The creation of this record is based on the scribe's personal observations and the provider's statements to them.  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  He has a mass involving the right lateral pharyngeal wall inferior to the tonsillar region and extending to the midline of the vallecular. Concurrent chemoradiation with weekly cisplatin has been recommended.  Solitary hypermetabolic metastatic lymph node in the right level 2 nodal region, measuring 3.2 cm.  Anemia. He does have a history of B12 deficiency. I will order iron studies, B12 and folate for further evaluation.   This is a pleasant 75 year old male recently diagnosed with squamous cell carcinoma of the head and neck. He is scheduled for port placement and PEG tube on October 25th with Dr. Coralie Kelly. An EKG was performed today and looked good. We will plan to initiate treatment with concurrent chemoradiaton with weekly cisplatin. The schedule and potential toxicities of therapy were reviewed and we will schedule him for an education session with David Kelly. Hopefully, we can get him started in the next couple of weeks. He understands and agrees with this plan of care. I have answered his questions and he knows to call with any concerns.  I provided 45 minutes of face-to-face time during this this encounter and > 50% was spent counseling as documented under my assessment and plan.    David Kaplan, MD [facility] [address] 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 This began in July when the patient was able to palpate swelling of the right side of his neck. This area was not painful and he denied any dysphagia. He followed up with Dr. Melida Kelly of otolaryngology on July 28th who performed FNA. Pathology from this procedure revealed rare atypical cells of undermined significance. Repeat FNA was performed on August 16th and pathology revealed acute and chronic inflammation, with no malignant cell identified. Eventually, right neck lymph node biopsy was obtained on September 6th and final pathology confirmed metastatic squamous cell carcinoma associated with scanty lymphoid tissue.  Laryngoscopy performed on September 8th revealed a mass involving the right lateral pharyngeal wall inferior to the tonsillar region and extending to the midline of the vallecular.  NM PET was performed on September 28th, revealing increased radiotracer uptake in the patient's right hypopharynx. A solitary hypermetabolic metastatic lymph node in the right level 2 nodal region was also observed, measuring 3.2 cm. No distant metastatic disease was seen. He was seen in consultation by Dr. Isidore Kelly and Dr. Alvy Kelly and concurrent chemoradiation with weekly cisplatin was recommended. As the patient lives locally, he would like treatment to be carried out at our facility.   INTERVAL HISTORY:  I have  reviewed his chart and materials related to his cancer extensively and collaborated history with the patient. Summary of oncologic history is as follows: Oncology History Overview Note  Immunohistochemistry for p16 is strongly  positive.    Pharyngeal carcinoma, squamous cell ([facility])  09/10/2020 Initial Diagnosis   He presented with a palpable knot in his upper right neck to Dr. Redmond Kelly on 09/10/20. Patient denied any pain at that time, though did report some difficulty swallowing.     10/20/2020 Pathology Results   FINAL MICROSCOPIC DIAGNOSIS:   A. LYMPH NODE, RIGHT NECK, NEEDLE CORE BIOPSY:  - Metastatic squamous cell carcinoma associated with scanty lymphoid tissue.   10/20/2020 Procedure   Technically successful ultrasound guided biopsy of indeterminate right neck mass/nodal conglomeration.   10/22/2020 Procedure   The fiberoptic laryngoscope was then placed through the nasal passage to view the pharynx and larynx. After completion, the telescope was removed. Findings included normal nasal passages and no mass or abnormality in the nasopharynx. There is a granular mass involving the right lateral pharyngeal wall inferior to tonsillar area and extending to the midline of the vallecula. The larynx appears normal. Pyriform sinuses are open. Secretions are minimal. Vocal folds are without mass, scarring, or ulceration. The vocal folds adduct and abduct symmetrically. There is good glottal closure. Muscle tension patterns are not present. Laryngeal edema is minimal.    11/11/2020 PET scan   1. Hypermetabolic mass in the RIGHT hypopharynx localizing to the RIGHT tonsillar pillar and vallecula. 2. Solitary hypermetabolic metastatic lymph node to the RIGHT level II nodal station. 3. No distant metastatic disease.   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[facility])   11/16/2020 Cancer Staging   Staging form: Pharynx - HPV-Mediated Oropharynx, AJCC 8th Edition - Clinical stage from 11/16/2020: Stage I (cT2, cN1, cM0, p16+) - Signed by David Gibson, MD on 11/16/2020 Stage prefix: Initial diagnosis     David Kelly has undergone dental extractions and is healing well. He denies any dysphagia and continues a  soft food diet. He denies sore throat. He has chronic back pain and arthralgias of the knees and shoulders. He uses a can to ambulate, and had a knee replacement 10 years ago. He feels that his grandparents may have had some form of malignancy, but he is unsure. Hemoglobin is 11.8, and white count and platelets are normal. We will check iron studies, B12 and folate for further evaluation. He does have a history of B12 deficiency. He was on oral B12 supplement, but with his recent diagnosis, has been less compliant. Chemistries are unremarkable. His  appetite is fair, and he is eating well. He denies any significant unintentional weight loss or gain.  He denies fever, chills or other signs of infection.  He denies nausea, vomiting, bowel issues, or abdominal pain.  He denies sore throat, cough, dyspnea, or chest pain.  HISTORY:   Past Medical History:  Diagnosis Date   Allergy    Arthritis    Blood transfusion without reported diagnosis    with bleeding colon per pt in the past    BPH (benign prostatic hypertrophy)    Cancer (HCC)    head/neck   Diabetes mellitus    DX  5 YR AGO  Type II   Glaucoma    H/O: GI bleed    History of kidney stones    HLD (hyperlipidemia) 09/28/2017   Myocardial infarction ([facility])    in the 1990's    Past Surgical History:  Procedure Laterality  Date   COLONOSCOPY     CYSTOSCOPY WITH RETROGRADE PYELOGRAM, URETEROSCOPY AND STENT PLACEMENT Right 05/20/2016   Procedure: CYSTOSCOPY, URETEROSCOPY, TRANSURETHRAL INCISION OF URETHROCELE, BASKET STONE REMOVAL;  Surgeon: Irine Seal, MD;  Location: [facility];  Service: Urology;  Laterality: Right;   Gall Bladder STones removed     left total knee replacement     MULTIPLE EXTRACTIONS WITH ALVEOLOPLASTY N/A 11/20/2020   Procedure: MULTIPLE EXTRACTION WITH ALVEOLOPLASTY;  Surgeon: [name], DMD;  Location: [facility];  Service: Dentistry;  Laterality: N/A;   TOTAL KNEE ARTHROPLASTY  04/04/2011   Procedure: TOTAL KNEE  ARTHROPLASTY;  Surgeon: Rudean Haskell, MD;  Location: [facility];  Service: Orthopedics;  Laterality: Left;    Family History  Problem Relation Age of Onset   Arthritis Mother    Asthma Paternal Grandfather    Diabetes Sister    Colon polyps Brother    Colon cancer Neg Hx    Esophageal cancer Neg Hx    Rectal cancer Neg Hx    Stomach cancer Neg Hx     Social History:  reports that he quit smoking about 14 years ago. His smoking use included cigarettes. He has a 67.50 pack-year smoking history. He quit smokeless tobacco use about 2 years ago.  His smokeless tobacco use included chew. He reports that he does not drink alcohol and does not use drugs.The patient is alone today. He is single and lives at home alone. He has 4 children. He is retired and has never been exposed to chemicals or other toxic agents.  Allergies: No Known Allergies  Current Medications: Current Outpatient Medications  Medication Sig Dispense Refill   brimonidine-timolol (COMBIGAN) 0.2-0.5 % ophthalmic solution Place 1 drop into both eyes every 12 (twelve) hours. 15 mL 1   diclofenac sodium (VOLTAREN) 1 % GEL Apply 1 application topically 3 (three) times daily. Right knee and both shoulders. 3 Tube 4   Fluocinolone Acetonide 0.01 % OIL Place 5 drops in ear(s) 2 (two) times daily. 20 mL 1   fluticasone (FLONASE) 50 MCG/ACT nasal spray Place 2 sprays into both nostrils daily. 16 g 6   levocetirizine (XYZAL) 5 MG tablet      metFORMIN (GLUCOPHAGE) 1000 MG tablet TAKE 1 TABLET BY MOUTH 2 TIMES DAILY WITH A MEAL 180 tablet 3   montelukast (SINGULAIR) 10 MG tablet Take 1 tablet (10 mg total) by mouth at bedtime. 90 tablet 3   Multiple Vitamin (MULTIVITAMIN WITH MINERALS) TABS tablet Take 1 tablet by mouth daily.     naproxen (NAPROSYN) 500 MG tablet Take by mouth.     psyllium (METAMUCIL) 58.6 % packet Take 1 packet by mouth daily.     tamsulosin (FLOMAX) 0.4 MG CAPS capsule Take 1 capsule (0.4 mg total) by mouth at  bedtime. 90 capsule 3   triamcinolone ointment (KENALOG) 0.5 % APPLY TO AFFECTED AREA(S) TWICE DAILY 30 g 2   No current facility-administered medications for this visit.    REVIEW OF SYSTEMS:  Review of Systems  Constitutional: Negative.  Negative for appetite change, chills, fatigue, fever and unexpected weight change.  HENT:  Negative.  Negative for trouble swallowing.   Eyes: Negative.   Respiratory: Negative.  Negative for chest tightness, cough, hemoptysis, shortness of breath and wheezing.   Cardiovascular: Negative.  Negative for chest pain, leg swelling and palpitations.  Gastrointestinal: Negative.  Negative for abdominal distention, abdominal pain, blood in stool, constipation, diarrhea, nausea and vomiting.  Endocrine: Negative.   Genitourinary:  Negative.  Negative for difficulty urinating, dysuria, frequency and hematuria.   Musculoskeletal:  Positive for arthralgias (right knee and shoulder), back pain and gait problem (uses a cane to ambulate, has had some falls). Negative for flank pain and myalgias.  Skin: Negative.   Neurological:  Positive for gait problem (uses a cane to ambulate, has had some falls). Negative for dizziness, extremity weakness, headaches, light-headedness, numbness, seizures and speech difficulty.  Hematological: Negative.   Psychiatric/Behavioral: Negative.  Negative for depression and sleep disturbance. The patient is not nervous/anxious.   All other systems reviewed and are negative.   VITALS:  Blood pressure (!) 147/71, pulse 100, temperature 97.9 F (36.6 C), temperature source Oral, resp. rate 18, height 6' (1.829 m), weight 198 lb 4.8 oz (89.9 kg), SpO2 97 %.  Wt Readings from Last 3 Encounters:  12/03/20 198 lb 4.8 oz (89.9 kg)  11/20/20 204 lb (92.5 kg)  11/17/20 204 lb 3.2 oz (92.6 kg)    Body mass index is 26.89 kg/m.  Performance status (ECOG): 1 - Symptomatic but completely ambulatory  PHYSICAL EXAM:  Physical  Exam Constitutional:      General: He is not in acute distress.    Appearance: Normal appearance. He is normal weight.  HENT:     Head: Normocephalic and atraumatic.     Mouth/Throat:     Comments: Healing gums Eyes:     General: No scleral icterus.    Extraocular Movements: Extraocular movements intact.     Conjunctiva/sclera: Conjunctivae normal.     Pupils: Pupils are equal, round, and reactive to light.  Neck:     Comments: Firm mass measuring 3-4 cm of the right anterior neck, just below the jaw.  Cardiovascular:     Rate and Rhythm: Normal rate and regular rhythm.     Pulses: Normal pulses.     Heart sounds: Normal heart sounds. No murmur heard.   No friction rub. No gallop.  Pulmonary:     Effort: Pulmonary effort is normal. No respiratory distress.     Breath sounds: Normal breath sounds.  Abdominal:     General: Bowel sounds are normal. There is no distension.     Palpations: Abdomen is soft. There is no hepatomegaly, splenomegaly or mass.     Tenderness: There is no abdominal tenderness.  Musculoskeletal:        General: Normal range of motion.     Cervical back: Normal range of motion and neck supple.     Right lower leg: No edema.     Left lower leg: No edema.  Lymphadenopathy:     Cervical: No cervical adenopathy.  Skin:    General: Skin is warm and dry.  Neurological:     General: No focal deficit present.     Mental Status: He is alert and oriented to person, place, and time. Mental status is at baseline.  Psychiatric:        Mood and Affect: Mood normal.        Behavior: Behavior normal.        Thought Content: Thought content normal.        Judgment: Judgment normal.    LABS:   CBC Latest Ref Rng & Units 12/03/2020 11/20/2020 04/29/2020  WBC - 8.6 7.1 7.4  Hemoglobin 13.5 - 17.5 11.8(A) 11.3(L) 12.2(L)  Hematocrit 41 - 53 36(A) 37.4(L) 37.3(L)  Platelets 150 - 399 350 281 294.0   CMP Latest Ref Rng & Units 12/03/2020 11/20/2020 04/29/2020  Glucose  70 - 99 mg/dL - 164(H) 138(H)  BUN 4 - 21 9 13 11   Creatinine 0.6 - 1.3 0.7 0.76 0.73  Sodium 137 - 147 139 134(L) 138  Potassium 3.4 - 5.3 4.0 3.9 4.6  Chloride 99 - 108 106 102 103  CO2 13 - 22 22 23 26   Calcium 8.7 - 10.7 9.3 9.1 9.9  Total Protein 6.0 - 8.3 g/dL - - 7.5  Total Bilirubin 0.2 - 1.2 mg/dL - - 0.4  Alkaline Phos 25 - 125 76 - 58  AST 14 - 40 26 - 18  ALT 10 - 40 17 - 20     No results found for: CEA1 / No results found for: CEA1 No results found for: PSA1 No results found for: AST419 No results found for: QQI297  No results found for: TOTALPROTELP, ALBUMINELP, A1GS, A2GS, BETS, BETA2SER, GAMS, MSPIKE, SPEI Lab Results  Component Value Date   IRONPCTSAT 22.8 03/11/2019   IRONPCTSAT 46.6 06/28/2016   No results found for: LDH  STUDIES:  NM PET Image Initial (PI) Skull Base To Thigh (F-18 FDG)  Result Date: 11/12/2020 CLINICAL DATA:  Subsequent treatment strategy for metastatic lymphadenopathy. Metastatic squamous cell carcinoma 2 cervical lymph node EXAM: NUCLEAR MEDICINE PET SKULL BASE TO THIGH TECHNIQUE: 10.3 mCi F-18 FDG was injected intravenously. Full-ring PET imaging was performed from the skull base to thigh after the radiotracer. CT data was obtained and used for attenuation correction and anatomic localization. Fasting blood glucose: 184 mg/dl COMPARISON:  CT neck 07/13/2020 FINDINGS: Mediastinal blood pool activity: SUV max 2.6 Liver activity: SUV max NA NECK: Intense asymmetric hypermetabolic activity in the RIGHT hypopharynx with SUV max equal 17.6. Hypermetabolic activity associated with thickened tissue in the region of the RIGHT tonsil and vallecula. Large RIGHT level II lymph node measures 3.2 cm with SUV max equal 14.4. No additional hypermetabolic lymph nodes or enlarged lymph nodes within the LEFT or RIGHT neck. Incidental CT findings: none CHEST: Normal mediastinum and cardiac silhouette. Normal pulmonary vasculature. No evidence of effusion,  infiltrate, or pneumothorax. No acute bony abnormality. Incidental CT findings: none ABDOMEN/PELVIS: No abnormal hypermetabolic activity within the liver, pancreas, adrenal glands, or spleen. No hypermetabolic lymph nodes in the abdomen or pelvis. Incidental CT findings: none SKELETON: No focal hypermetabolic activity to suggest skeletal metastasis. Incidental CT findings: none IMPRESSION: 1. Hypermetabolic mass in the RIGHT hypopharynx localizing to the RIGHT tonsillar pillar and vallecula. 2. Solitary hypermetabolic metastatic lymph node to the RIGHT level II nodal station. 3. No distant metastatic disease. Electronically Signed   By: Suzy Bouchard M.D.   On: 11/12/2020 08:24     Specimen A.  Final Interpretation   09/10/2020  A. Neck, Fine Needle Aspiration I, (Smears, Liquid Based Preparation):              Rare atypical cell of undermined significance.     Specimen A.  Final Interpretation   09/29/2020  A.  RIGHT NECK MASS, ZONE 2, FINE NEEDLE ASPIRATION I (SMEAR AND THINPREP):              Acute and chronic inflammation.              No malignant cells identified.    FINAL MICROSCOPIC DIAGNOSIS:  10/20/2020  A. LYMPH NODE, RIGHT NECK, NEEDLE CORE BIOPSY:  - Metastatic squamous cell carcinoma associated with scanty lymphoid  tissue.  - See comment.     I, Rita Ohara, am acting as scribe for David Kaplan,  MD  I have reviewed this report as typed by the medical scribe, and it is complete and accurate.

## 2020-12-02 NOTE — Progress Notes (Deleted)
HEMATOLOGY-ONCOLOGY ELECTRONIC VISIT PROGRESS NOTE  Patient Care Team: Biagio Borg, MD as PCP - General (Internal Medicine) Landis Martins, DPM as Consulting Physician (Podiatry) Irine Seal, MD as Attending Physician (Urology) Malmfelt, Stephani Police, RN as Oncology Nurse Navigator Melida Quitter, MD as Consulting Physician (Otolaryngology) Heath Lark, MD as Consulting Physician (Hematology and Oncology) Eppie Gibson, MD as Consulting Physician (Radiation Oncology)  I connected with the patient via telephone conference and verified that I am speaking with the correct person using two identifiers. The patient's location is at home and I am providing care from the [facility] I discussed the limitations, risks, security and privacy concerns of performing an evaluation and management service by e-visits and the availability of in person appointments.  I also discussed with the patient that there may be a patient responsible charge related to this service. The patient expressed understanding and agreed to proceed.   ASSESSMENT & PLAN:  No problem-specific Assessment & Plan notes found for this encounter.   No orders of the defined types were placed in this encounter.   INTERVAL HISTORY: Please see below for problem oriented charting. The purpose of today's discussion is ***   SUMMARY OF ONCOLOGIC HISTORY: Oncology History Overview Note  Immunohistochemistry for p16 is strongly positive.    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 Redmond Baseman on 09/10/20. Patient denied any pain at that time, though did report some difficulty swallowing.     10/20/2020 Pathology Results   FINAL MICROSCOPIC DIAGNOSIS:   A. LYMPH NODE, RIGHT NECK, NEEDLE CORE BIOPSY:  - Metastatic squamous cell carcinoma associated with scanty lymphoid tissue.   10/20/2020 Procedure   Technically successful ultrasound  guided biopsy of indeterminate right neck mass/nodal conglomeration.   10/22/2020 Procedure   The fiberoptic laryngoscope was then placed through the nasal passage to view the pharynx and larynx. After completion, the telescope was removed. Findings included normal nasal passages and no mass or abnormality in the nasopharynx. There is a granular mass involving the right lateral pharyngeal wall inferior to tonsillar area and extending to the midline of the vallecula. The larynx appears normal. Pyriform sinuses are open. Secretions are minimal. Vocal folds are without mass, scarring, or ulceration. The vocal folds adduct and abduct symmetrically. There is good glottal closure. Muscle tension patterns are not present. Laryngeal edema is minimal.    11/11/2020 PET scan   1. Hypermetabolic mass in the RIGHT hypopharynx localizing to the RIGHT tonsillar pillar and vallecula. 2. Solitary hypermetabolic metastatic lymph node to the RIGHT level II nodal station. 3. No distant metastatic disease.   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[place])   11/16/2020 Cancer Staging   Staging form: Pharynx - HPV-Mediated Oropharynx, AJCC 8th Edition - Clinical stage from 11/16/2020: Stage I (cT2, cN1, cM0, p16+) - Signed by Eppie Gibson, MD on 11/16/2020 Stage prefix: Initial diagnosis     REVIEW OF SYSTEMS:   Constitutional: Denies fevers, chills or abnormal weight loss Eyes: Denies blurriness of vision Ears, nose, mouth, throat, and face: Denies mucositis or sore throat Respiratory: Denies cough, dyspnea or wheezes Cardiovascular: Denies palpitation, chest discomfort Gastrointestinal:  Denies nausea, heartburn or change in bowel habits Skin: Denies abnormal skin rashes Lymphatics: Denies new lymphadenopathy or easy bruising Neurological:Denies numbness, tingling or new weaknesses Behavioral/Psych: Mood is stable, no new changes  Extremities: No lower extremity edema All other systems  were  reviewed with the patient and are negative.  I have reviewed the past medical history, past surgical history, social history and family history with the patient and they are unchanged from previous note.  ALLERGIES:  has No Known Allergies.  MEDICATIONS:  Current Outpatient Medications  Medication Sig Dispense Refill   ACCU-CHEK AVIVA PLUS test strip USE AS DIRECTED 100 each 12   aspirin EC 81 MG tablet Take 81 mg by mouth at bedtime.     atorvastatin (LIPITOR) 40 MG tablet Take 1 tablet (40 mg total) by mouth daily. 90 tablet 3   brimonidine-timolol (COMBIGAN) 0.2-0.5 % ophthalmic solution Place 1 drop into both eyes every 12 (twelve) hours. 15 mL 1   diclofenac sodium (VOLTAREN) 1 % GEL Apply 1 application topically 3 (three) times daily. Right knee and both shoulders. 3 Tube 4   Fluocinolone Acetonide 0.01 % OIL Place 5 drops in ear(s) 2 (two) times daily. 20 mL 1   fluticasone (FLONASE) 50 MCG/ACT nasal spray Place 2 sprays into both nostrils daily. 16 g 6   glipiZIDE (GLUCOTROL) 10 MG tablet Take 1 tablet (10 mg total) by mouth daily before breakfast. 90 tablet 3   levocetirizine (XYZAL) 5 MG tablet      metFORMIN (GLUCOPHAGE) 1000 MG tablet TAKE 1 TABLET BY MOUTH 2 TIMES DAILY WITH A MEAL 180 tablet 3   montelukast (SINGULAIR) 10 MG tablet Take 1 tablet (10 mg total) by mouth at bedtime. 90 tablet 3   Multiple Vitamin (MULTIVITAMIN WITH MINERALS) TABS tablet Take 1 tablet by mouth daily.     naproxen (NAPROSYN) 500 MG tablet Take by mouth.     Promethazine-Codeine 6.25-10 MG/5ML SOLN TAKE 5ML BY MOUTH EVERY 4 HOURS AS NEEDED FOR COUGH 180 mL 0   psyllium (METAMUCIL) 58.6 % packet Take 1 packet by mouth daily.     tamsulosin (FLOMAX) 0.4 MG CAPS capsule Take 1 capsule (0.4 mg total) by mouth at bedtime. 90 capsule 3   triamcinolone ointment (KENALOG) 0.5 % APPLY TO AFFECTED AREA(S) TWICE DAILY 30 g 2   TRUEplus Lancets 28G MISC USE AS DIRECTED 100 each 3   vitamin B-12  (CYANOCOBALAMIN) 1000 MCG tablet Take 1 tablet (1,000 mcg total) by mouth daily. 90 tablet 3   No current facility-administered medications for this visit.    PHYSICAL EXAMINATION: ECOG PERFORMANCE STATUS: {CHL ONC ECOG MG:[id number]}  LABORATORY DATA:  I have reviewed the data as listed CMP Latest Ref Rng & Units 11/20/2020 04/29/2020 03/11/2019  Glucose 70 - 99 mg/dL 164(H) 138(H) 105(H)  BUN 8 - 23 mg/dL 13 11 14   Creatinine 0.61 - 1.24 mg/dL 0.76 0.73 0.73  Sodium 135 - 145 mmol/L 134(L) 138 138  Potassium 3.5 - 5.1 mmol/L 3.9 4.6 4.5  Chloride 98 - 111 mmol/L 102 103 103  CO2 22 - 32 mmol/L 23 26 24   Calcium 8.9 - 10.3 mg/dL 9.1 9.9 9.6  Total Protein 6.0 - 8.3 g/dL - 7.5 7.2  Total Bilirubin 0.2 - 1.2 mg/dL - 0.4 0.3  Alkaline Phos 39 - 117 U/L - 58 58  AST 0 - 37 U/L - 18 18  ALT 0 - 53 U/L - 20 25    Lab Results  Component Value Date   WBC 7.1 11/20/2020   HGB 11.3 (L) 11/20/2020   HCT 37.4 (L) 11/20/2020   MCV 85.6 11/20/2020   PLT 281 11/20/2020   NEUTROABS 5.0 04/29/2020     RADIOGRAPHIC STUDIES: I have personally reviewed the  radiological images as listed and agreed with the findings in the report. NM PET Image Initial (PI) Skull Base To Thigh (F-18 FDG)  Result Date: 11/12/2020 CLINICAL DATA:  Subsequent treatment strategy for metastatic lymphadenopathy. Metastatic squamous cell carcinoma 2 cervical lymph node EXAM: NUCLEAR MEDICINE PET SKULL BASE TO THIGH TECHNIQUE: 10.3 mCi F-18 FDG was injected intravenously. Full-ring PET imaging was performed from the skull base to thigh after the radiotracer. CT data was obtained and used for attenuation correction and anatomic localization. Fasting blood glucose: 184 mg/dl COMPARISON:  CT neck 07/13/2020 FINDINGS: Mediastinal blood pool activity: SUV max 2.6 Liver activity: SUV max NA NECK: Intense asymmetric hypermetabolic activity in the RIGHT hypopharynx with SUV max equal 17.6. Hypermetabolic activity associated with  thickened tissue in the region of the RIGHT tonsil and vallecula. Large RIGHT level II lymph node measures 3.2 cm with SUV max equal 14.4. No additional hypermetabolic lymph nodes or enlarged lymph nodes within the LEFT or RIGHT neck. Incidental CT findings: none CHEST: Normal mediastinum and cardiac silhouette. Normal pulmonary vasculature. No evidence of effusion, infiltrate, or pneumothorax. No acute bony abnormality. Incidental CT findings: none ABDOMEN/PELVIS: No abnormal hypermetabolic activity within the liver, pancreas, adrenal glands, or spleen. No hypermetabolic lymph nodes in the abdomen or pelvis. Incidental CT findings: none SKELETON: No focal hypermetabolic activity to suggest skeletal metastasis. Incidental CT findings: none IMPRESSION: 1. Hypermetabolic mass in the RIGHT hypopharynx localizing to the RIGHT tonsillar pillar and vallecula. 2. Solitary hypermetabolic metastatic lymph node to the RIGHT level II nodal station. 3. No distant metastatic disease. Electronically Signed   By: Suzy Bouchard M.D.   On: 11/12/2020 08:24    I discussed the assessment and treatment plan with the patient. The patient was provided an opportunity to ask questions and all were answered. The patient agreed with the plan and demonstrated an understanding of the instructions. The patient was advised to call back or seek an in-person evaluation if the symptoms worsen or if the condition fails to improve as anticipated.    I spent {CHL ONC TIME VISIT - GNOIB:[id number]} for the appointment reviewing test results, discuss management and coordination of care.  [name], MD 12/02/2020 6:38 PM

## 2020-12-03 ENCOUNTER — Inpatient Hospital Stay (INDEPENDENT_AMBULATORY_CARE_PROVIDER_SITE_OTHER): Payer: Medicare Other | Admitting: Oncology

## 2020-12-03 ENCOUNTER — Encounter: Payer: Self-pay | Admitting: Oncology

## 2020-12-03 ENCOUNTER — Other Ambulatory Visit: Payer: Self-pay | Admitting: Oncology

## 2020-12-03 ENCOUNTER — Inpatient Hospital Stay: Payer: Medicare Other

## 2020-12-03 ENCOUNTER — Other Ambulatory Visit: Payer: Self-pay | Admitting: Hematology and Oncology

## 2020-12-03 VITALS — BP 147/71 | HR 100 | Temp 97.9°F | Resp 18 | Ht 72.0 in | Wt 198.3 lb

## 2020-12-03 DIAGNOSIS — D539 Nutritional anemia, unspecified: Secondary | ICD-10-CM

## 2020-12-03 DIAGNOSIS — C140 Malignant neoplasm of pharynx, unspecified: Secondary | ICD-10-CM

## 2020-12-03 DIAGNOSIS — C760 Malignant neoplasm of head, face and neck: Secondary | ICD-10-CM | POA: Diagnosis not present

## 2020-12-03 LAB — BASIC METABOLIC PANEL
BUN: 9 (ref 4–21)
CO2: 22 (ref 13–22)
Chloride: 106 (ref 99–108)
Creatinine: 0.7 (ref 0.6–1.3)
Glucose: 148
Potassium: 4 (ref 3.4–5.3)
Sodium: 139 (ref 137–147)

## 2020-12-03 LAB — HEPATIC FUNCTION PANEL
ALT: 17 (ref 10–40)
AST: 26 (ref 14–40)
Alkaline Phosphatase: 76 (ref 25–125)
Bilirubin, Total: 0.5

## 2020-12-03 LAB — FOLATE: Folate: 6.1 ng/mL (ref >5.9)

## 2020-12-03 LAB — CBC AND DIFFERENTIAL
HCT: 36 — AB (ref 41–53)
Hemoglobin: 11.8 — AB (ref 13.5–17.5)
Neutrophils Absolute: 6.11
Platelets: 350 (ref 150–399)
WBC: 8.6

## 2020-12-03 LAB — IRON AND TIBC
Iron: 76 ug/dL (ref 45–182)
Saturation Ratios: 16 % — ABNORMAL LOW (ref 17.9–39.5)
TIBC: 462 ug/dL — ABNORMAL HIGH (ref 250–450)
UIBC: 386 ug/dL

## 2020-12-03 LAB — VITAMIN B12: Vitamin B-12: 291 pg/mL (ref 180–914)

## 2020-12-03 LAB — FERRITIN: Ferritin: 60 ng/mL (ref 24–336)

## 2020-12-03 LAB — CBC
MCV: 82 (ref 80–94)
RBC: 4.41 (ref 3.87–5.11)

## 2020-12-03 LAB — COMPREHENSIVE METABOLIC PANEL
Albumin: 4.7 (ref 3.5–5.0)
Calcium: 9.3 (ref 8.7–10.7)

## 2020-12-09 ENCOUNTER — Inpatient Hospital Stay: Payer: Medicare Other

## 2020-12-09 ENCOUNTER — Other Ambulatory Visit: Payer: Self-pay

## 2020-12-09 ENCOUNTER — Other Ambulatory Visit: Payer: Self-pay | Admitting: Hematology and Oncology

## 2020-12-09 DIAGNOSIS — E119 Type 2 diabetes mellitus without complications: Secondary | ICD-10-CM

## 2020-12-09 DIAGNOSIS — Z09 Encounter for follow-up examination after completed treatment for conditions other than malignant neoplasm: Secondary | ICD-10-CM | POA: Insufficient documentation

## 2020-12-09 DIAGNOSIS — C140 Malignant neoplasm of pharynx, unspecified: Secondary | ICD-10-CM

## 2020-12-10 ENCOUNTER — Telehealth: Payer: Self-pay

## 2020-12-10 NOTE — Progress Notes (Signed)
LATE ENTRY from 12/09/2020:   Gave him peanut butter crackers with a regular sprite, labs collected, and Zachery Dauer, PA in to see patient.  After approx 30 minuets patient started to feel better, speech was clearer and he was joking with [place].  Pt did ambulate and proceeded to [facility].  After SIM VS took, [employer].  pt tolerated procedure well.  Walked patient out to his son who came to drive patient home.

## 2020-12-11 ENCOUNTER — Inpatient Hospital Stay: Payer: Medicare Other | Admitting: Hematology and Oncology

## 2020-12-11 ENCOUNTER — Other Ambulatory Visit: Payer: Self-pay

## 2020-12-11 NOTE — Progress Notes (Signed)
This encounter was created in error - please disregard.

## 2020-12-12 ENCOUNTER — Other Ambulatory Visit: Payer: Self-pay | Admitting: Oncology

## 2020-12-12 NOTE — Progress Notes (Signed)
START ON PATHWAY REGIMEN - Head and Neck     A cycle is every 7 days:     Cisplatin   **Always confirm dose/schedule in your pharmacy ordering system**  Patient Characteristics: Hypopharynx, Preoperative or Nonsurgical Candidate (Clinical Staging), Stage III - IVB Disease Classification: Hypopharynx Therapeutic Status: Preoperative or Nonsurgical Candidate (Clinical Staging) AJCC T Category: cT2 AJCC N Category: cN1 AJCC M Category: cM0 AJCC 8 Stage Grouping: III Intent of Therapy: Curative Intent, Discussed with Patient

## 2020-12-14 ENCOUNTER — Telehealth: Payer: Self-pay

## 2020-12-14 NOTE — Telephone Encounter (Signed)
-----   Message from [name], MD sent at 12/12/2020  8:23 PM EDT ----- Regarding: call Tell him iron levels are mildly low, I rec he take 1 daily

## 2020-12-14 NOTE — Progress Notes (Signed)
Spoke with patient regarding [employer], he stated he would need help with gas for radiation treatment. Let him know [facility] does offer transportation, if he needs that service in the future. He is going to bring in his financial paperwork later this week so we can get him enrolled.

## 2020-12-16 ENCOUNTER — Telehealth: Payer: Self-pay

## 2020-12-16 NOTE — Telephone Encounter (Signed)
-----   Message from [name], MD sent at 12/12/2020  8:23 PM EDT ----- Regarding: call Tell him iron levels are mildly low, I rec he take 1 daily

## 2020-12-16 NOTE — Telephone Encounter (Signed)
David Kelly informed patient of information

## 2020-12-17 ENCOUNTER — Other Ambulatory Visit: Payer: Medicare Other

## 2020-12-17 ENCOUNTER — Other Ambulatory Visit: Payer: Self-pay | Admitting: Hematology and Oncology

## 2020-12-17 DIAGNOSIS — C140 Malignant neoplasm of pharynx, unspecified: Secondary | ICD-10-CM

## 2020-12-18 ENCOUNTER — Inpatient Hospital Stay: Payer: Medicare Other

## 2020-12-18 ENCOUNTER — Telehealth: Payer: Self-pay | Admitting: Oncology

## 2020-12-18 ENCOUNTER — Encounter: Payer: Self-pay | Admitting: Hematology and Oncology

## 2020-12-18 ENCOUNTER — Inpatient Hospital Stay: Payer: Medicare Other | Attending: Hematology and Oncology | Admitting: Hematology and Oncology

## 2020-12-18 VITALS — BP 144/69 | HR 88 | Temp 98.7°F | Resp 20 | Ht 72.0 in | Wt 198.1 lb

## 2020-12-18 DIAGNOSIS — Z5111 Encounter for antineoplastic chemotherapy: Secondary | ICD-10-CM | POA: Insufficient documentation

## 2020-12-18 DIAGNOSIS — C140 Malignant neoplasm of pharynx, unspecified: Secondary | ICD-10-CM | POA: Diagnosis not present

## 2020-12-18 DIAGNOSIS — C760 Malignant neoplasm of head, face and neck: Secondary | ICD-10-CM | POA: Insufficient documentation

## 2020-12-18 DIAGNOSIS — Z79899 Other long term (current) drug therapy: Secondary | ICD-10-CM | POA: Insufficient documentation

## 2020-12-18 LAB — CBC AND DIFFERENTIAL
HCT: 36 — AB (ref 41–53)
Hemoglobin: 11.5 — AB (ref 13.5–17.5)
Neutrophils Absolute: 4.76
Platelets: 268 (ref 150–399)
WBC: 6.8

## 2020-12-18 LAB — BASIC METABOLIC PANEL
BUN: 10 (ref 4–21)
CO2: 20 (ref 13–22)
Chloride: 108 (ref 99–108)
Creatinine: 0.6 (ref 0.6–1.3)
Glucose: 156
Potassium: 4.6 (ref 3.4–5.3)
Sodium: 141 (ref 137–147)

## 2020-12-18 LAB — HEPATIC FUNCTION PANEL
ALT: 17 (ref 10–40)
AST: 26 (ref 14–40)
Alkaline Phosphatase: 66 (ref 25–125)
Bilirubin, Total: 0.4

## 2020-12-18 LAB — COMPREHENSIVE METABOLIC PANEL
Albumin: 4.3 (ref 3.5–5.0)
Calcium: 9.3 (ref 8.7–10.7)

## 2020-12-18 LAB — CBC: RBC: 4.41 (ref 3.87–5.11)

## 2020-12-18 MED FILL — Dexamethasone Sodium Phosphate Inj 100 MG/10ML: INTRAMUSCULAR | Qty: 1 | Status: AC

## 2020-12-18 MED FILL — Fosaprepitant Dimeglumine For IV Infusion 150 MG (Base Eq): INTRAVENOUS | Qty: 5 | Status: AC

## 2020-12-18 NOTE — Progress Notes (Signed)
Patient brought in his financial paperwork, helped enroll him into the [employer].

## 2020-12-18 NOTE — Progress Notes (Signed)
[place]  [phone] Fax:([phone]  Patient Care Team: Biagio Borg, MD as PCP - General (Internal Medicine) Landis Martins, DPM as Consulting Physician (Podiatry) Irine Seal, MD as Attending Physician (Urology) Malmfelt, Stephani Police, RN as Oncology Nurse Navigator Melida Quitter, MD as Consulting Physician (Otolaryngology) Eppie Gibson, MD as Consulting Physician (Radiation Oncology) [name], MD as Consulting Physician (Oncology)   Name of the patient: David Kelly  [id number]  [birth year]   Date of visit: 12/18/20  Diagnosis- Pharyngeal carcinoma, squamous cell  Chief complaint/Reason for visit- Initial Meeting for [facility], preparing for starting chemotherapy   Heme/Onc history:  Oncology History Overview Note  Immunohistochemistry for p16 is strongly positive.    Pharyngeal carcinoma, squamous cell ([place])  09/10/2020 Initial Diagnosis   He presented with a palpable knot in his upper right neck to Dr. Redmond Baseman on 09/10/20. Patient denied any pain at that time, though did report some difficulty swallowing.     10/20/2020 Pathology Results   FINAL MICROSCOPIC DIAGNOSIS:   A. LYMPH NODE, RIGHT NECK, NEEDLE CORE BIOPSY:  - Metastatic squamous cell carcinoma associated with scanty lymphoid tissue.   10/20/2020 Procedure   Technically successful ultrasound guided biopsy of indeterminate right neck mass/nodal conglomeration.   10/22/2020 Procedure   The fiberoptic laryngoscope was then placed through the nasal passage to view the pharynx and larynx. After completion, the telescope was removed. Findings included normal nasal passages and no mass or abnormality in the nasopharynx. There is a granular mass involving the right lateral pharyngeal wall inferior to tonsillar area and extending to the midline of the vallecula. The larynx appears normal. Pyriform sinuses are open. Secretions are  minimal. Vocal folds are without mass, scarring, or ulceration. The vocal folds adduct and abduct symmetrically. There is good glottal closure. Muscle tension patterns are not present. Laryngeal edema is minimal.    11/11/2020 PET scan   1. Hypermetabolic mass in the RIGHT hypopharynx localizing to the RIGHT tonsillar pillar and vallecula. 2. Solitary hypermetabolic metastatic lymph node to the RIGHT level II nodal station. 3. No distant metastatic disease.   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[place])   11/16/2020 Cancer Staging   Staging form: Pharynx - HPV-Mediated Oropharynx, AJCC 8th Edition - Clinical stage from 11/16/2020: Stage I (cT2, cN1, cM0, p16+) - Signed by Eppie Gibson, MD on 11/16/2020 Stage prefix: Initial diagnosis    12/21/2020 -  Chemotherapy   Patient is on Treatment Plan : HEAD/NECK Cisplatin q7d       Interval history-  Patient presents to chemo care clinic today for initial meeting in preparation for starting chemotherapy. I introduced the chemo care clinic and we discussed that the role of the clinic is to assist those who are at an increased risk of emergency room visits and/or complications during the course of chemotherapy treatment. We discussed that the increased risk takes into account factors such as age, performance status, and co-morbidities. We also discussed that for some, this might include barriers to care such as not having a primary care provider, lack of insurance/transportation, or not being able to afford medications. We discussed that the goal of the program is to help prevent unplanned ER visits and help reduce complications during chemotherapy. We do this by discussing specific risk factors to each individual and identifying ways that we can help improve these risk factors and reduce barriers to care.   No Known Allergies  Past Medical History:  Diagnosis Date   Allergy    Arthritis    Blood transfusion without  reported diagnosis    with bleeding colon per pt in the past    BPH (benign prostatic hypertrophy)    Cancer (HCC)    head/neck   Diabetes mellitus    DX  5 YR AGO  Type II   Glaucoma    H/O: GI bleed    History of kidney stones    HLD (hyperlipidemia) 09/28/2017   Myocardial infarction ([place])    in the 1990's    Past Surgical History:  Procedure Laterality Date   COLONOSCOPY     CYSTOSCOPY WITH RETROGRADE PYELOGRAM, URETEROSCOPY AND STENT PLACEMENT Right 05/20/2016   Procedure: CYSTOSCOPY, URETEROSCOPY, TRANSURETHRAL INCISION OF URETHROCELE, BASKET STONE REMOVAL;  Surgeon: Irine Seal, MD;  Location: [facility];  Service: Urology;  Laterality: Right;   Gall Bladder STones removed     left total knee replacement     MULTIPLE EXTRACTIONS WITH ALVEOLOPLASTY N/A 11/20/2020   Procedure: MULTIPLE EXTRACTION WITH ALVEOLOPLASTY;  Surgeon: [name], DMD;  Location: [place];  Service: Dentistry;  Laterality: N/A;   TOTAL KNEE ARTHROPLASTY  04/04/2011   Procedure: TOTAL KNEE ARTHROPLASTY;  Surgeon: Rudean Haskell, MD;  Location: [place];  Service: Orthopedics;  Laterality: Left;    Social History   Socioeconomic History   Marital status: Single    Spouse name: Not on file   Number of children: 4   Years of education: 11   Highest education level: Not on file  Occupational History   Occupation: retired  Tobacco Use   Smoking status: Former    Packs/day: 1.50    Years: 45.00    Pack years: 67.50    Types: Cigarettes    Quit date: 03/21/2006    Years since quitting: 14.7   Smokeless tobacco: Former    Types: Chew    Quit date: 09/14/2018  Vaping Use   Vaping Use: Never used  Substance and Sexual Activity   Alcohol use: No   Drug use: No   Sexual activity: Not Currently  Other Topics Concern   Not on file  Social History Narrative   Fun: Fish and go to the park   Social Determinants of Radio broadcast assistant Strain: Not on file  Food Insecurity: Not on file   Transportation Needs: Not on file  Physical Activity: Not on file  Stress: Not on file  Social Connections: Not on file  Intimate Partner Violence: Not on file    Family History  Problem Relation Age of Onset   Arthritis Mother    Asthma Paternal Grandfather    Diabetes Sister    Colon polyps Brother    Colon cancer Neg Hx    Esophageal cancer Neg Hx    Rectal cancer Neg Hx    Stomach cancer Neg Hx      Current Outpatient Medications:    brimonidine-timolol (COMBIGAN) 0.2-0.5 % ophthalmic solution, Place 1 drop into both eyes every 12 (twelve) hours., Disp: 15 mL, Rfl: 1   diclofenac sodium (VOLTAREN) 1 % GEL, Apply 1 application topically 3 (three) times daily. Right knee and both shoulders., Disp: 3 Tube, Rfl: 4   Fluocinolone Acetonide 0.01 % OIL, Place 5 drops in ear(s) 2 (two) times daily., Disp: 20 mL, Rfl: 1   fluticasone (FLONASE) 50 MCG/ACT nasal spray, Place 2 sprays into both nostrils daily., Disp: 16 g, Rfl: 6   levocetirizine (XYZAL)  5 MG tablet, , Disp: , Rfl:    metFORMIN (GLUCOPHAGE) 1000 MG tablet, TAKE 1 TABLET BY MOUTH 2 TIMES DAILY WITH A MEAL, Disp: 180 tablet, Rfl: 3   montelukast (SINGULAIR) 10 MG tablet, Take 1 tablet (10 mg total) by mouth at bedtime., Disp: 90 tablet, Rfl: 3   Multiple Vitamin (MULTIVITAMIN WITH MINERALS) TABS tablet, Take 1 tablet by mouth daily., Disp: , Rfl:    naproxen (NAPROSYN) 500 MG tablet, Take by mouth., Disp: , Rfl:    psyllium (METAMUCIL) 58.6 % packet, Take 1 packet by mouth daily., Disp: , Rfl:    tamsulosin (FLOMAX) 0.4 MG CAPS capsule, Take 1 capsule (0.4 mg total) by mouth at bedtime., Disp: 90 capsule, Rfl: 3   triamcinolone ointment (KENALOG) 0.5 %, APPLY TO AFFECTED AREA(S) TWICE DAILY, Disp: 30 g, Rfl: 2  CMP Latest Ref Rng & Units 12/18/2020  Glucose 70 - 99 mg/dL -  BUN 4 - 21 10  Creatinine 0.6 - 1.3 0.6  Sodium 137 - 147 141  Potassium 3.4 - 5.3 4.6  Chloride 99 - 108 108  CO2 13 - 22 20  Calcium 8.7 - 10.7  9.3  Total Protein 6.0 - 8.3 g/dL -  Total Bilirubin 0.2 - 1.2 mg/dL -  Alkaline Phos 25 - 125 66  AST 14 - 40 26  ALT 10 - 40 17   CBC Latest Ref Rng & Units 12/18/2020  WBC - 6.8  Hemoglobin 13.5 - 17.5 11.5(A)  Hematocrit 41 - 53 36(A)  Platelets 150 - 399 268    No images are attached to the encounter.  No results found.   Assessment and plan- Patient is a 76 y.o. male who presents to [facility] for initial meeting in preparation for starting chemotherapy for the treatment of pharyngeal cancer.   Chemo Care Clinic/High Risk for ER/Hospitalization during chemotherapy- We discussed the role of the chemo care clinic and identified patient specific risk factors. I discussed that patient was identified as high risk primarily based on:  Patient has past medical history positive for: Past Medical History:  Diagnosis Date   Allergy    Arthritis    Blood transfusion without reported diagnosis    with bleeding colon per pt in the past    BPH (benign prostatic hypertrophy)    Cancer (HCC)    head/neck   Diabetes mellitus    DX  5 YR AGO  Type II   Glaucoma    H/O: GI bleed    History of kidney stones    HLD (hyperlipidemia) 09/28/2017   Myocardial infarction ([place])    in the 1990's    Patient has past surgical history positive for: Past Surgical History:  Procedure Laterality Date   COLONOSCOPY     CYSTOSCOPY WITH RETROGRADE PYELOGRAM, URETEROSCOPY AND STENT PLACEMENT Right 05/20/2016   Procedure: CYSTOSCOPY, URETEROSCOPY, TRANSURETHRAL INCISION OF URETHROCELE, BASKET STONE REMOVAL;  Surgeon: Irine Seal, MD;  Location: [facility];  Service: Urology;  Laterality: Right;   Gall Bladder STones removed     left total knee replacement     MULTIPLE EXTRACTIONS WITH ALVEOLOPLASTY N/A 11/20/2020   Procedure: MULTIPLE EXTRACTION WITH ALVEOLOPLASTY;  Surgeon: [name], DMD;  Location: [place];  Service: Dentistry;  Laterality: N/A;   TOTAL KNEE ARTHROPLASTY  04/04/2011    Procedure: TOTAL KNEE ARTHROPLASTY;  Surgeon: Rudean Haskell, MD;  Location: [place];  Service: Orthopedics;  Laterality: Left;   Provided general information including the following:  1.  Date of education: 12/18/2020 2.  Physician name: Dr. Hinton Rao 3.  Diagnosis: Pharyngeal cancer 4.  Stage: stage I 5.  Curative  6.  Chemotherapy plan including drugs and how often: Weekly Cisplatin 7.  Start date: 12/21/2020 8.  Other referrals: None at this time 9.  The patient is to call our office with any questions or concerns.  Our office number [phone], if after hours or on the weekend, call the same number and wait for the answering service.  There is always an oncologist on call 10.  Medications prescribed: Ondansetron, Prochlorperazine 11.  The patient has verbalized understanding of the treatment plan and has no barriers to adherence or understanding.  Obtained signed consent from patient.  Discussed symptoms including 1.  Low blood counts including red blood cells, white blood cells and platelets. 2. Infection including to avoid large crowds, wash hands frequently, and stay away from people who were sick.  If fever develops of 100.4 or higher, call our office. 3.  Mucositis-given instructions on mouth rinse (baking soda and salt mixture).  Keep mouth clean.  Use soft bristle toothbrush.  If mouth sores develop, call our clinic. 4.  Nausea/vomiting-gave prescriptions for ondansetron 4 mg every 4 hours as needed for nausea, may take around the clock if persistent.  Compazine 10 mg every 6 hours, may take around the clock if persistent. 5.  Diarrhea-use over-the-counter Imodium.  Call clinic if not controlled. 6.  Constipation-use senna, 1 to 2 tablets twice a day.  If no BM in 2 to 3 days call the clinic. 7.  Loss of appetite-try to eat small meals every 2-3 hours.  Call clinic if not eating. 8.  Taste changes-zinc 500 mg daily.  If becomes severe call clinic. 9.  Alcoholic beverages. 10.   Drink 2 to 3 quarts of water per day. 11.  Peripheral neuropathy-patient to call if numbness or tingling in hands or feet is persistent  Answered questions to patient satisfaction.  Patient is to call with any further questions or concerns.  Time spent on this palliative care/chemotherapy education was 30 minutes with more than 50% spent discussing diagnosis, prognosis and symptom management.  The medication prescribed to the patient will be printed out from [place] references This will give the following information: Name of your medication Approved uses Dose and schedule Storage and handling Handling body fluids and waste Drug and food interactions Possible side effects and management Pregnancy, sexual activity, and contraception Obtaining medication   We discussed that social determinants of health may have significant impacts on health and outcomes for cancer patients.  Today we discussed specific social determinants of performance status, alcohol use, depression, financial needs, food insecurity, housing, interpersonal violence, social connections, stress, tobacco use, and transportation.    After lengthy discussion the following were identified as areas of need:   Outpatient services: We discussed options including home based and outpatient services, DME and care program. We discusssed that patients who participate in regular physical activity report fewer negative impacts of cancer and treatments and report less fatigue.   Financial Concerns: We discussed that living with cancer can create tremendous financial burden.  We discussed options for assistance. I asked that if assistance is needed in affording medications or paying bills to please let us know so that we can provide assistance.  Referral to Social work: Introduced Education officer, museum Mort Sawyers and the services she can provide such as support with utility bill, cell phone and gas vouchers.  Support groups: We discussed  options for support groups at the cancer center. If interested, please notify nurse navigator to enroll. We discussed options for managing stress including healthy eating, exercise as well as participating in no charge counseling services at the cancer center and support groups.  If these are of interest, patient can notify either myself or primary nursing team.We discussed options for management including medications and referral to quit Smart program  Transportation: We discussed options for transportation.  I have notified primary oncology team who will help assist with arranging Lucianne Lei transportation for appointments when/if needed. We also discussed options for transportation on short notice/acute visits.  Palliative care services: We have palliative care services available in the cancer center to discuss goals of care and advanced care planning.  Please let us know if you have any questions or would like to speak to our palliative nurse practitioner.  Symptom Management Clinic: We discussed our symptom management clinic which is available for acute concerns while receiving treatment such as nausea, vomiting or diarrhea.  We can be reached via telephone at [phone] or through my chart.  We are available for virtual or in person visits on the same day from 830 to 4 PM Monday through Friday. He denies needing specific assistance at this time and He will be followed by Dr. Hinton Rao clinical team.  Plan: Discussed symptom management clinic. Discussed palliative care services. Discussed resources that are available here at the cancer center. Discussed medications and new prescriptions to begin treatment such as anti-nausea or steroids.   Disposition: RTC on   Visit Diagnosis No diagnosis found.  Patient expressed understanding and was in agreement with this plan. He also understands that He can call clinic at any time with any questions, concerns, or complaints.   I provided 30 minutes of  face  to face  during this encounter, and > 50% was spent counseling as documented under my assessment & plan.    Dayton Scrape, FNP- [facility]

## 2020-12-18 NOTE — Telephone Encounter (Signed)
Patient's 11/7 Infusion rescheduled to 11/8 at 9:00 am due to [employer]

## 2020-12-21 ENCOUNTER — Encounter: Payer: Self-pay | Admitting: Oncology

## 2020-12-21 ENCOUNTER — Inpatient Hospital Stay: Payer: Medicare Other

## 2020-12-21 MED ORDER — PROCHLORPERAZINE MALEATE 10 MG PO TABS: 10.0000 mg | ORAL_TABLET | Freq: Four times a day (QID) | ORAL | 1 refills | Status: DC | PRN

## 2020-12-21 MED ORDER — ONDANSETRON HCL 8 MG PO TABS: 8.0000 mg | ORAL_TABLET | Freq: Two times a day (BID) | ORAL | 1 refills | Status: DC | PRN

## 2020-12-21 MED FILL — Cisplatin Inj 100 MG/100ML (1 MG/ML): INTRAVENOUS | Qty: 85 | Status: AC

## 2020-12-21 MED FILL — Fosaprepitant Dimeglumine For IV Infusion 150 MG (Base Eq): INTRAVENOUS | Qty: 5 | Status: AC

## 2020-12-21 MED FILL — Dexamethasone Sodium Phosphate Inj 100 MG/10ML: INTRAMUSCULAR | Qty: 1 | Status: AC

## 2020-12-22 ENCOUNTER — Inpatient Hospital Stay: Payer: Medicare Other

## 2020-12-22 ENCOUNTER — Telehealth: Payer: Self-pay | Admitting: Internal Medicine

## 2020-12-22 ENCOUNTER — Other Ambulatory Visit: Payer: Self-pay

## 2020-12-22 VITALS — BP 139/71 | HR 76 | Temp 98.0°F | Resp 18 | Ht 72.0 in | Wt 200.1 lb

## 2020-12-22 DIAGNOSIS — Z5111 Encounter for antineoplastic chemotherapy: Secondary | ICD-10-CM | POA: Diagnosis present

## 2020-12-22 DIAGNOSIS — Z79899 Other long term (current) drug therapy: Secondary | ICD-10-CM | POA: Diagnosis not present

## 2020-12-22 DIAGNOSIS — C760 Malignant neoplasm of head, face and neck: Secondary | ICD-10-CM | POA: Diagnosis present

## 2020-12-22 DIAGNOSIS — C140 Malignant neoplasm of pharynx, unspecified: Secondary | ICD-10-CM

## 2020-12-22 MED ORDER — PALONOSETRON HCL INJECTION 0.25 MG/5ML
0.2500 mg | Freq: Once | INTRAVENOUS | Status: AC
  Administered 2020-12-22: 0.25 mg via INTRAVENOUS
  Filled 2020-12-22: qty 5

## 2020-12-22 MED ORDER — MAGNESIUM SULFATE 2 GM/50ML IV SOLN
2.0000 g | Freq: Once | INTRAVENOUS | Status: AC
  Administered 2020-12-22: 2 g via INTRAVENOUS
  Filled 2020-12-22: qty 50

## 2020-12-22 MED ORDER — SODIUM CHLORIDE 0.9% FLUSH
10.0000 mL | INTRAVENOUS | Status: DC | PRN
  Administered 2020-12-22: 10 mL

## 2020-12-22 MED ORDER — HEPARIN SOD (PORK) LOCK FLUSH 100 UNIT/ML IV SOLN
500.0000 [IU] | Freq: Once | INTRAVENOUS | Status: AC | PRN
  Administered 2020-12-22: 500 [IU]

## 2020-12-22 MED ORDER — SODIUM CHLORIDE 0.9 % IV SOLN
150.0000 mg | Freq: Once | INTRAVENOUS | Status: AC
  Administered 2020-12-22: 150 mg via INTRAVENOUS
  Filled 2020-12-22: qty 150
  Filled 2020-12-22: qty 5

## 2020-12-22 MED ORDER — SODIUM CHLORIDE 0.9 % IV SOLN: Freq: Once | INTRAVENOUS | Status: AC

## 2020-12-22 MED ORDER — SODIUM CHLORIDE 0.9 % IV SOLN
39.5000 mg/m2 | Freq: Once | INTRAVENOUS | Status: AC
  Administered 2020-12-22: 85 mg via INTRAVENOUS
  Filled 2020-12-22: qty 85

## 2020-12-22 MED ORDER — POTASSIUM CHLORIDE IN NACL 20-0.9 MEQ/L-% IV SOLN
Freq: Once | INTRAVENOUS | Status: AC
  Filled 2020-12-22: qty 1000

## 2020-12-22 MED ORDER — SODIUM CHLORIDE 0.9 % IV SOLN
10.0000 mg | Freq: Once | INTRAVENOUS | Status: AC
  Administered 2020-12-22: 10 mg via INTRAVENOUS
  Filled 2020-12-22: qty 1
  Filled 2020-12-22: qty 10

## 2020-12-22 NOTE — Telephone Encounter (Signed)
Left message for patient to call  back to schedule Medicare Annual Wellness Visit   Last AWV  10/24/19  Please schedule at anytime with [facility] if patient calls the office back.    40 Minutes appointment   Any questions, please call me at [phone]

## 2020-12-22 NOTE — Patient Instructions (Signed)
David Kelly  Discharge Instructions: Thank you for choosing [facility] to provide your oncology and hematology care.  If you have a lab appointment with the [facility], please go directly to the [facility] and check in at the registration area.   Wear comfortable clothing and clothing appropriate for easy access to any Portacath or PICC line.   We strive to give you quality time with your provider. You may need to reschedule your appointment if you arrive late (15 or more minutes).  Arriving late affects you and other patients whose appointments are after yours.  Also, if you miss three or more appointments without notifying the office, you may be dismissed from the clinic at the provider's discretion.      For prescription refill requests, have your pharmacy contact our office and allow 72 hours for refills to be completed.    Today you received the following chemotherapy and/or immunotherapy agents cisplatin    To help prevent nausea and vomiting after your treatment, we encourage you to take your nausea medication as directed.  BELOW ARE SYMPTOMS THAT SHOULD BE REPORTED IMMEDIATELY: *FEVER GREATER THAN 100.4 F (38 C) OR HIGHER *CHILLS OR SWEATING *NAUSEA AND VOMITING THAT IS NOT CONTROLLED WITH YOUR NAUSEA MEDICATION *UNUSUAL SHORTNESS OF BREATH *UNUSUAL BRUISING OR BLEEDING *URINARY PROBLEMS (pain or burning when urinating, or frequent urination) *BOWEL PROBLEMS (unusual diarrhea, constipation, pain near the anus) TENDERNESS IN MOUTH AND THROAT WITH OR WITHOUT PRESENCE OF ULCERS (sore throat, sores in mouth, or a toothache) UNUSUAL RASH, SWELLING OR PAIN  UNUSUAL VAGINAL DISCHARGE OR ITCHING   Items with * indicate a potential emergency and should be followed up as soon as possible or go to the Emergency Department if any problems should occur.  Please show the CHEMOTHERAPY ALERT CARD or IMMUNOTHERAPY ALERT CARD at check-in to the  Emergency Department and triage nurse.  Should you have questions after your visit or need to cancel or reschedule your appointment, please contact [facility]  Dept: [phone]  and follow the prompts.  Office hours are 8:00 a.m. to 4:30 p.m. Monday - Friday. Please note that voicemails left after 4:00 p.m. may not be returned until the following business day.  We are closed weekends and major holidays. You have access to a nurse at all times for urgent questions. Please call the main number to the clinic Dept: [phone] and follow the prompts.  For any non-urgent questions, you may also contact your provider using MyChart. We now offer e-Visits for anyone 35 and older to request care online for non-urgent symptoms. For details visit [email or web address].   Also download the MyChart app! Go to the app store, search "MyChart", open the app, select [facility], and log in with your MyChart username and password.  Due to Covid, a mask is required upon entering the hospital/clinic. If you do not have a mask, one will be given to you upon arrival. For doctor visits, patients may have 1 support person aged 50 or older with them. For treatment visits, patients cannot have anyone with them due to current Covid guidelines and our immunocompromised population.   Cisplatin injection What is this medication? CISPLATIN (SIS pla tin) is a chemotherapy drug. It targets fast dividing cells, like cancer cells, and causes these cells to die. This medicine is used to treat many types of cancer like bladder, ovarian, and testicular cancers. This medicine may be used for other purposes; ask  your health care provider or pharmacist if you have questions. COMMON BRAND NAME(S): Platinol, Platinol -AQ What should I tell my care team before I take this medication? They need to know if you have any of these conditions: eye disease, vision problems hearing problems kidney disease low blood  counts, like white cells, platelets, or red blood cells tingling of the fingers or toes, or other nerve disorder an unusual or allergic reaction to cisplatin, carboplatin, oxaliplatin, other medicines, foods, dyes, or preservatives pregnant or trying to get pregnant breast-feeding How should I use this medication? This drug is given as an infusion into a vein. It is administered in a hospital or clinic by a specially trained health care professional. Talk to your pediatrician regarding the use of this medicine in children. Special care may be needed. Overdosage: If you think you have taken too much of this medicine contact a poison control center or emergency room at once. NOTE: This medicine is only for you. Do not share this medicine with others. What if I miss a dose? It is important not to miss a dose. Call your doctor or health care professional if you are unable to keep an appointment. What may interact with this medication? This medicine may interact with the following medications: foscarnet certain antibiotics like amikacin, gentamicin, neomycin, polymyxin B, streptomycin, tobramycin, vancomycin This list may not describe all possible interactions. Give your health care provider a list of all the medicines, herbs, non-prescription drugs, or dietary supplements you use. Also tell them if you smoke, drink alcohol, or use illegal drugs. Some items may interact with your medicine. What should I watch for while using this medication? Your condition will be monitored carefully while you are receiving this medicine. You will need important blood work done while you are taking this medicine. This drug may make you feel generally unwell. This is not uncommon, as chemotherapy can affect healthy cells as well as cancer cells. Report any side effects. Continue your course of treatment even though you feel ill unless your doctor tells you to stop. This medicine may increase your risk of getting an  infection. Call your healthcare professional for advice if you get a fever, chills, or sore throat, or other symptoms of a cold or flu. Do not treat yourself. Try to avoid being around people who are sick. Avoid taking medicines that contain aspirin, acetaminophen, ibuprofen, naproxen, or ketoprofen unless instructed by your healthcare professional. These medicines may hide a fever. This medicine may increase your risk to bruise or bleed. Call your doctor or health care professional if you notice any unusual bleeding. Be careful brushing and flossing your teeth or using a toothpick because you may get an infection or bleed more easily. If you have any dental work done, tell your dentist you are receiving this medicine. Do not become pregnant while taking this medicine or for 14 months after stopping it. Women should inform their healthcare professional if they wish to become pregnant or think they might be pregnant. Men should not father a child while taking this medicine and for 11 months after stopping it. There is potential for serious side effects to an unborn child. Talk to your healthcare professional for more information. Do not breast-feed an infant while taking this medicine. This medicine has caused ovarian failure in some women. This medicine may make it more difficult to get pregnant. Talk to your healthcare professional if you are concerned about your fertility. This medicine has caused decreased sperm counts in  some men. This may make it more difficult to father a child. Talk to your healthcare professional if you are concerned about your fertility. Drink fluids as directed while you are taking this medicine. This will help protect your kidneys. Call your doctor or health care professional if you get diarrhea. Do not treat yourself. What side effects may I notice from receiving this medication? Side effects that you should report to your doctor or health care professional as soon as  possible: allergic reactions like skin rash, itching or hives, swelling of the face, lips, or tongue blurred vision changes in vision decreased hearing or ringing of the ears nausea, vomiting pain, redness, or irritation at site where injected pain, tingling, numbness in the hands or feet signs and symptoms of bleeding such as bloody or black, tarry stools; red or dark brown urine; spitting up blood or brown material that looks like coffee grounds; red spots on the skin; unusual bruising or bleeding from the eyes, gums, or nose signs and symptoms of infection like fever; chills; cough; sore throat; pain or trouble passing urine signs and symptoms of kidney injury like trouble passing urine or change in the amount of urine signs and symptoms of low red blood cells or anemia such as unusually weak or tired; feeling faint or lightheaded; falls; breathing problems Side effects that usually do not require medical attention (report to your doctor or health care professional if they continue or are bothersome): loss of appetite mouth sores muscle cramps This list may not describe all possible side effects. Call your doctor for medical advice about side effects. You may report side effects to FDA at 1-800-FDA-1088. Where should I keep my medication? This drug is given in a hospital or clinic and will not be stored at home. NOTE: This sheet is a summary. It may not cover all possible information. If you have questions about this medicine, talk to your doctor, pharmacist, or health care provider.  2022 Elsevier/Gold Standard (2020-10-20 00:00:00)

## 2020-12-23 ENCOUNTER — Telehealth: Payer: Self-pay

## 2020-12-23 NOTE — Telephone Encounter (Addendum)
12/24/20- Tashe Purdon,RN - I spoke with pt. He states, "I'm doing fine ma'am". Pt denies N/V, fevers, rash, & diarrhea. I reminded pt to call us if he develops a fever of 100.4 or higher, DAY or NIGHT. He verbalized understanding. I confirmed next appt's.   12/23/20 - Ellese Julius,RN: I attempted call to pt to see how he did overnight & today, after 1st chemo infusion (Cisplatin) yesterday.

## 2020-12-24 ENCOUNTER — Encounter: Payer: Self-pay | Admitting: Oncology

## 2020-12-24 ENCOUNTER — Inpatient Hospital Stay: Payer: Medicare Other

## 2020-12-24 DIAGNOSIS — C140 Malignant neoplasm of pharynx, unspecified: Secondary | ICD-10-CM

## 2020-12-24 LAB — BASIC METABOLIC PANEL
BUN: 15 (ref 4–21)
CO2: 27 — AB (ref 13–22)
Chloride: 106 (ref 99–108)
Creatinine: 0.7 (ref 0.6–1.3)
Glucose: 109
Potassium: 3.8 (ref 3.4–5.3)
Sodium: 141 (ref 137–147)

## 2020-12-24 LAB — HEPATIC FUNCTION PANEL
ALT: 24 (ref 10–40)
AST: 36 (ref 14–40)
Alkaline Phosphatase: 63 (ref 25–125)
Bilirubin, Total: 0.7

## 2020-12-24 LAB — COMPREHENSIVE METABOLIC PANEL
Albumin: 4.3 (ref 3.5–5.0)
Calcium: 9.1 (ref 8.7–10.7)

## 2020-12-24 LAB — CBC AND DIFFERENTIAL
HCT: 37 — AB (ref 41–53)
Hemoglobin: 12.1 — AB (ref 13.5–17.5)
Neutrophils Absolute: 6.72
Platelets: 301 (ref 150–399)
WBC: 8.4

## 2020-12-24 LAB — CBC: RBC: 4.57 (ref 3.87–5.11)

## 2020-12-25 ENCOUNTER — Encounter: Payer: Self-pay | Admitting: Sports Medicine

## 2020-12-25 ENCOUNTER — Ambulatory Visit: Payer: Medicare Other | Admitting: Hematology and Oncology

## 2020-12-25 ENCOUNTER — Encounter: Payer: Self-pay | Admitting: Hematology and Oncology

## 2020-12-25 ENCOUNTER — Other Ambulatory Visit: Payer: Medicare Other

## 2020-12-25 ENCOUNTER — Other Ambulatory Visit: Payer: Self-pay

## 2020-12-25 ENCOUNTER — Ambulatory Visit (INDEPENDENT_AMBULATORY_CARE_PROVIDER_SITE_OTHER): Payer: Medicare Other | Admitting: Sports Medicine

## 2020-12-25 DIAGNOSIS — B351 Tinea unguium: Secondary | ICD-10-CM | POA: Diagnosis not present

## 2020-12-25 DIAGNOSIS — M79676 Pain in unspecified toe(s): Secondary | ICD-10-CM | POA: Diagnosis not present

## 2020-12-25 DIAGNOSIS — E119 Type 2 diabetes mellitus without complications: Secondary | ICD-10-CM

## 2020-12-25 MED FILL — Dexamethasone Sodium Phosphate Inj 100 MG/10ML: INTRAMUSCULAR | Qty: 1 | Status: AC

## 2020-12-25 MED FILL — Cisplatin Inj 100 MG/100ML (1 MG/ML): INTRAVENOUS | Qty: 85 | Status: AC

## 2020-12-25 MED FILL — Fosaprepitant Dimeglumine For IV Infusion 150 MG (Base Eq): INTRAVENOUS | Qty: 5 | Status: AC

## 2020-12-25 NOTE — Progress Notes (Signed)
  Subjective: David Kelly is a 75 y.o. male patient with history of diabetes who returns to office today complaining of long, painful nails while ambulating in shoes; unable to trim. Denies any other pedal complaints.   Patient reports that he has a feeding tube in now and is undergoing radiation.   Patient Active Problem List   Diagnosis Date Noted   Deficiency anemia 12/03/2020    Class: Chronic   Loss of teeth due to extraction 12/01/2020   Retained tooth root 12/01/2020   Preventive measure    Pharyngeal carcinoma, squamous cell ([facility]) 11/16/2020   Vitamin D deficiency 04/30/2020   B12 deficiency 04/29/2020   Abscess of finger 09/06/2018   MI (myocardial infarction) ([facility]) 12/11/2017   History of left knee replacement 12/11/2017   HLD (hyperlipidemia) 09/28/2017   Chronic cough 03/31/2017   Primary osteoarthritis of right knee 03/15/2017   Unspecified injury of lower back, sequela 03/15/2017   Bilateral rotator cuff syndrome 03/15/2017   Recurrent falls while walking 12/13/2016   Chronic pain 12/13/2016   Kidney stones    Glaucoma    Gastrointestinal bleed 06/28/2016   Dysuria 10/28/2015   Diabetes mellitus ([facility]) 07/15/2015   Osteoarthritis 07/15/2015    No Known Allergies    Objective: General: Patient is awake, alert, and oriented x 3 and in no acute distress. No acute changes with physical exam  Integument: Skin is warm, dry and supple bilateral. Nails are tender, mildly elongated, thickened and dystrophic with subungual debris, consistent with onychomycosis, 1-5 bilateral. No signs of infection. No open lesions or preulcerative lesions present bilateral.  Remaining integument unremarkable.  Vasculature:  Dorsalis Pedis pulse 2/4 bilateral. Posterior Tibial pulse 1 /4 bilateral. Capillary fill time <3 sec 1-5 bilateral. Positive hair growth to the level of the digits.Temperature gradient within normal limits. No varicosities present bilateral. No edema present  bilateral.   Neurology: The patient has intact sensation measured with a 5.07/10g Semmes Weinstein Monofilament at all pedal sites bilateral . Vibratory sensation diminished bilateral with tuning fork. No Babinski sign present bilateral, unchanged from prior.   Musculoskeletal: Asymptomatic bunion and hammertoe and planus pedal deformities noted bilateral. Muscular strength 5/5 in all lower extremity muscular groups bilateral without pain on range of motion. No tenderness with calf compression bilateral.  Assessment and Plan: Problem List Items Addressed This Visit   None Visit Diagnoses     Pain due to onychomycosis of toenail    -  Primary   Diabetes mellitus without complication ([facility])          -Examined patient. -Re-Discussed and educated patient on diabetic foot care and the importance of daily inspection of feet in the setting of diabetes -Mechanically debrided all nails 1-5 bilateral using sterile nail nipper and filed with dremel without incident -Patient to return  in 3 months for at risk foot care or sooner if there are any problems or issues  Landis Martins, DPM

## 2020-12-28 ENCOUNTER — Other Ambulatory Visit: Payer: Self-pay

## 2020-12-28 ENCOUNTER — Encounter: Payer: Self-pay | Admitting: Oncology

## 2020-12-28 ENCOUNTER — Inpatient Hospital Stay: Payer: Medicare Other

## 2020-12-28 VITALS — BP 150/79 | HR 108 | Temp 97.7°F | Resp 18 | Ht 72.0 in | Wt 198.2 lb

## 2020-12-28 DIAGNOSIS — Z5111 Encounter for antineoplastic chemotherapy: Secondary | ICD-10-CM | POA: Diagnosis not present

## 2020-12-28 DIAGNOSIS — C140 Malignant neoplasm of pharynx, unspecified: Secondary | ICD-10-CM

## 2020-12-28 MED ORDER — PALONOSETRON HCL INJECTION 0.25 MG/5ML
0.2500 mg | Freq: Once | INTRAVENOUS | Status: AC
  Administered 2020-12-28: 0.25 mg via INTRAVENOUS
  Filled 2020-12-28: qty 5

## 2020-12-28 MED ORDER — HEPARIN SOD (PORK) LOCK FLUSH 100 UNIT/ML IV SOLN
500.0000 [IU] | Freq: Once | INTRAVENOUS | Status: AC | PRN
  Administered 2020-12-28: 500 [IU]

## 2020-12-28 MED ORDER — SODIUM CHLORIDE 0.9 % IV SOLN
10.0000 mg | Freq: Once | INTRAVENOUS | Status: AC
  Administered 2020-12-28: 10 mg via INTRAVENOUS
  Filled 2020-12-28: qty 10

## 2020-12-28 MED ORDER — SODIUM CHLORIDE 0.9 % IV SOLN
150.0000 mg | Freq: Once | INTRAVENOUS | Status: AC
  Administered 2020-12-28: 150 mg via INTRAVENOUS
  Filled 2020-12-28: qty 150

## 2020-12-28 MED ORDER — SODIUM CHLORIDE 0.9 % IV SOLN
39.5000 mg/m2 | Freq: Once | INTRAVENOUS | Status: AC
  Administered 2020-12-28: 85 mg via INTRAVENOUS
  Filled 2020-12-28: qty 85

## 2020-12-28 MED ORDER — SODIUM CHLORIDE 0.9% FLUSH
10.0000 mL | INTRAVENOUS | Status: DC | PRN
  Administered 2020-12-28: 10 mL

## 2020-12-28 MED ORDER — SODIUM CHLORIDE 0.9 % IV SOLN: Freq: Once | INTRAVENOUS | Status: AC

## 2020-12-28 MED ORDER — MAGNESIUM SULFATE 2 GM/50ML IV SOLN
2.0000 g | Freq: Once | INTRAVENOUS | Status: AC
  Administered 2020-12-28: 2 g via INTRAVENOUS
  Filled 2020-12-28: qty 50

## 2020-12-28 MED ORDER — POTASSIUM CHLORIDE IN NACL 20-0.9 MEQ/L-% IV SOLN
Freq: Once | INTRAVENOUS | Status: AC
  Filled 2020-12-28: qty 1000

## 2020-12-28 NOTE — Patient Instructions (Signed)
[facility]  Discharge Instructions: Thank you for choosing [facility] to provide your oncology and hematology care.  If you have a lab appointment with the [facility], please go directly to the [facility] and check in at the registration area.   Wear comfortable clothing and clothing appropriate for easy access to any Portacath or PICC line.   We strive to give you quality time with your provider. You may need to reschedule your appointment if you arrive late (15 or more minutes).  Arriving late affects you and other patients whose appointments are after yours.  Also, if you miss three or more appointments without notifying the office, you may be dismissed from the clinic at the provider's discretion.      For prescription refill requests, have your pharmacy contact our office and allow 72 hours for refills to be completed.    Today you received the following chemotherapy and/or immunotherapy agents  Cisplatin     To help prevent nausea and vomiting after your treatment, we encourage you to take your nausea medication as directed.  BELOW ARE SYMPTOMS THAT SHOULD BE REPORTED IMMEDIATELY: *FEVER GREATER THAN 100.4 F (38 C) OR HIGHER *CHILLS OR SWEATING *NAUSEA AND VOMITING THAT IS NOT CONTROLLED WITH YOUR NAUSEA MEDICATION *UNUSUAL SHORTNESS OF BREATH *UNUSUAL BRUISING OR BLEEDING *URINARY PROBLEMS (pain or burning when urinating, or frequent urination) *BOWEL PROBLEMS (unusual diarrhea, constipation, pain near the anus) TENDERNESS IN MOUTH AND THROAT WITH OR WITHOUT PRESENCE OF ULCERS (sore throat, sores in mouth, or a toothache) UNUSUAL RASH, SWELLING OR PAIN  UNUSUAL VAGINAL DISCHARGE OR ITCHING   Items with * indicate a potential emergency and should be followed up as soon as possible or go to the Emergency Department if any problems should occur.  Please show the CHEMOTHERAPY ALERT CARD or IMMUNOTHERAPY ALERT CARD at check-in to the  Emergency Department and triage nurse.  Should you have questions after your visit or need to cancel or reschedule your appointment, please contact [facility]  Dept: [phone]  and follow the prompts.  Office hours are 8:00 a.m. to 4:30 p.m. Monday - Friday. Please note that voicemails left after 4:00 p.m. may not be returned until the following business day.  We are closed weekends and major holidays. You have access to a nurse at all times for urgent questions. Please call the main number to the clinic Dept: [phone] and follow the prompts.  For any non-urgent questions, you may also contact your provider using MyChart. We now offer e-Visits for anyone 44 and older to request care online for non-urgent symptoms. For details visit [email or web address].   Also download the MyChart app! Go to the app store, search "MyChart", open the app, select [facility], and log in with your MyChart username and password.  Due to Covid, a mask is required upon entering the hospital/clinic. If you do not have a mask, one will be given to you upon arrival. For doctor visits, patients may have 1 support person aged 68 or older with them. For treatment visits, patients cannot have anyone with them due to current Covid guidelines and our immunocompromised population.

## 2020-12-28 NOTE — Progress Notes (Signed)
1510: Flushed and cleaned site , a new dressing applied to Gastrostomy tube. Tube was clogged with gastric contents. Flushed with cola 20 cc unclogged the tubing and flushed with 100 cc water per patient request. 1747: PT STABLE AT TIME OF DISCHARGE

## 2020-12-31 ENCOUNTER — Encounter: Payer: Self-pay | Admitting: Oncology

## 2020-12-31 NOTE — Progress Notes (Signed)
David Kelly  [address] [place],  [place]  [address] [phone]  Clinic Day:  01/11/2021  Referring physician: Biagio Borg, MD  This document serves as a record of services personally performed by [name], MD. It was created on their behalf by Curry,Lauren E, a trained medical scribe. The creation of this record is based on the scribe's personal observations and the provider's statements to them.  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  He has a mass involving the right lateral pharyngeal wall inferior to the tonsillar region and extending to the midline of the vallecular. He is receiving concurrent chemoradiation with weekly cisplatin. He is tolerating this fairly well, but he has issues with malnutrition and weight loss.  Solitary hypermetabolic metastatic lymph node in the right level 2 nodal region, measuring 3.2 cm.  Anemia. He does have a history of B12 deficiency. Iron studies are consistent with iron deficiency and we recommend iron supplement daily.  Mucositis. I think he has some element of candidiasis as well. I will place him on Diflucan 100 mg for 10 days.  Noncompliance with tube feedings. I again explained the importance of this and that it is temporary. We will have the nurses work with him regarding this.  Homeless. He is sleeping in his car and the social worker will meet with him to discuss options and support.   He is receiving concurrent chemoradiaton with weekly cisplatin and tolerating fairly well. However, with his weight loss, I again explained the importance of compliance with his tube feedings. The nurses will continue to meet with him regularly to assist with dressing changes and tube flushing and care and encourage him to use this.  I will send in Diflucan 100 mg daily for 10 days for mucositis/candidiasis. We will recommend  iron supplement, but I am not sure what he will be able to swallow. He will proceed with a 4th cycle of chemotherapy tomorrow, and he will continue with daily radiation. We will see him back in 1 week with CBC and CMP for repeat evaluation. He understands and agrees with this plan of care. In view of his living situation, we will have him speak to [place], our Education officer, museum, regarding possible housing or any aid or support resources. I have answered his questions and he knows to call with any concerns.  I provided 20 minutes of face-to-face time during this this encounter and > 50% was spent counseling as documented under my assessment and plan.    [name], MD [facility] [address] 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 This began in July when the patient was able to palpate swelling of the right side of his neck. This area was not painful and he denied any dysphagia. He followed up with Dr. Melida Quitter of otolaryngology on July 28th who performed FNA. Pathology from this procedure revealed rare atypical cells of undermined significance. Repeat FNA was performed on August 16th and pathology revealed acute and chronic inflammation, with  no malignant cell identified. Eventually, right neck lymph node biopsy was obtained on September 6th and final pathology confirmed metastatic squamous cell carcinoma associated with scanty lymphoid tissue.  Laryngoscopy performed on September 8th revealed a mass involving the right lateral pharyngeal wall inferior to the tonsillar region and extending to  the midline of the vallecular.  NM PET was performed on September 28th, revealing increased radiotracer uptake in the patient's right hypopharynx. A solitary hypermetabolic metastatic lymph node in the right level 2 nodal region was also observed, measuring 3.2 cm. No distant metastatic disease was seen. He was seen in consultation by Dr. Isidore Moos and Dr. Alvy Bimler and concurrent chemoradiation with weekly cisplatin was recommended. As the patient lives locally, he would like treatment to be carried out at our facility. He uses a cane to ambulate, and had a knee replacement 10 years ago.   INTERVAL HISTORY:  I have reviewed his chart and materials related to his cancer extensively and collaborated history with the patient. Summary of oncologic history is as follows: Oncology History Overview Note  Immunohistochemistry for p16 is strongly positive.    Pharyngeal carcinoma, squamous cell ([facility])  09/10/2020 Initial Diagnosis   He presented with a palpable knot in his upper right neck to Dr. Redmond Baseman on 09/10/20. Patient denied any pain at that time, though did report some difficulty swallowing.     10/20/2020 Pathology Results   FINAL MICROSCOPIC DIAGNOSIS:   A. LYMPH NODE, RIGHT NECK, NEEDLE CORE BIOPSY:  - Metastatic squamous cell carcinoma associated with scanty lymphoid tissue.   10/20/2020 Procedure   Technically successful ultrasound guided biopsy of indeterminate right neck mass/nodal conglomeration.   10/22/2020 Procedure   The fiberoptic laryngoscope was then placed through the nasal passage to view the pharynx and larynx. After completion, the telescope was removed. Findings included normal nasal passages and no mass or abnormality in the nasopharynx. There is a granular mass involving the right lateral pharyngeal wall inferior to tonsillar area and extending to the midline of the vallecula. The larynx appears normal. Pyriform sinuses are open. Secretions are minimal. Vocal folds are without mass,  scarring, or ulceration. The vocal folds adduct and abduct symmetrically. There is good glottal closure. Muscle tension patterns are not present. Laryngeal edema is minimal.    11/11/2020 PET scan   1. Hypermetabolic mass in the RIGHT hypopharynx localizing to the RIGHT tonsillar pillar and vallecula. 2. Solitary hypermetabolic metastatic lymph node to the RIGHT level II nodal station. 3. No distant metastatic disease.   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[facility])   11/16/2020 Cancer Staging   Staging form: Pharynx - HPV-Mediated Oropharynx, AJCC 8th Edition - Clinical stage from 11/16/2020: Stage I (cT2, cN1, cM0, p16+) - Signed by Eppie Gibson, MD on 11/16/2020 Stage prefix: Initial diagnosis    12/22/2020 -  Chemotherapy   Patient is on Treatment Plan : HEAD/NECK Cisplatin q7d       David Kelly is here for routine follow up prior to a 4th cycle of weekly cisplatin. He is receiving concurrent radiation therapy and tolerating fair. He states that he has some days where he does well and other days where he doesn't. He has had to spit up more saliva. However, he has not been using his feeding tube. He asks about his prognosis, and I explained that we are administering this treatment with curative intent. He notes chronic arthralgias of the right knee, shoulders and back. Blood counts are unremarkable except for a hemoglobin of 11.4,  previously 11.8. His  appetite is poor and nothing tastes good to him, and he has lost 11 pounds since his last visit.  He denies fever, chills or other signs of infection.  He denies nausea, vomiting, bowel issues, or abdominal pain.  He denies sore throat, cough, dyspnea, or chest pain.  HISTORY:   Allergies: No Known Allergies  Current Medications: Current Outpatient Medications  Medication Sig Dispense Refill   ferrous sulfate 325 (65 FE) MG tablet Take 325 mg by mouth daily with breakfast. Informed patient to start taking iron  supplement every day. 01/11/21     brimonidine-timolol (COMBIGAN) 0.2-0.5 % ophthalmic solution Place 1 drop into both eyes every 12 (twelve) hours. 15 mL 1   diclofenac sodium (VOLTAREN) 1 % GEL Apply 1 application topically 3 (three) times daily. Right knee and both shoulders. 3 Tube 4   Fluocinolone Acetonide 0.01 % OIL Place 5 drops in ear(s) 2 (two) times daily. 20 mL 1   fluticasone (FLONASE) 50 MCG/ACT nasal spray Place 2 sprays into both nostrils daily. 16 g 6   levocetirizine (XYZAL) 5 MG tablet      metFORMIN (GLUCOPHAGE) 1000 MG tablet TAKE 1 TABLET BY MOUTH 2 TIMES DAILY WITH A MEAL 180 tablet 3   montelukast (SINGULAIR) 10 MG tablet Take 1 tablet (10 mg total) by mouth at bedtime. 90 tablet 3   Multiple Vitamin (MULTIVITAMIN WITH MINERALS) TABS tablet Take 1 tablet by mouth daily.     naproxen (NAPROSYN) 500 MG tablet Take by mouth.     ondansetron (ZOFRAN) 8 MG tablet Take 1 tablet (8 mg total) by mouth 2 (two) times daily as needed. Start on the third day after cisplatin chemotherapy. 30 tablet 1   prochlorperazine (COMPAZINE) 10 MG tablet Take 1 tablet (10 mg total) by mouth every 6 (six) hours as needed (Nausea or vomiting). 30 tablet 1   psyllium (METAMUCIL) 58.6 % packet Take 1 packet by mouth daily.     tamsulosin (FLOMAX) 0.4 MG CAPS capsule Take 1 capsule (0.4 mg total) by mouth at bedtime. 90 capsule 3   triamcinolone ointment (KENALOG) 0.5 % APPLY TO AFFECTED AREA(S) TWICE DAILY 30 g 2   No current facility-administered medications for this visit.    REVIEW OF SYSTEMS:  Review of Systems  Constitutional:  Positive for appetite change (poor with taste changes) and unexpected weight change (11 pound weight loss). Negative for chills, fatigue and fever.  HENT:   Positive for nosebleeds (occasional when he blows his nose).   Eyes: Negative.   Respiratory: Negative.  Negative for chest tightness, cough, hemoptysis, shortness of breath and wheezing.   Cardiovascular:  Negative.  Negative for chest pain, leg swelling and palpitations.  Gastrointestinal: Negative.  Negative for abdominal distention, abdominal pain, blood in stool, constipation, diarrhea, nausea and vomiting.  Endocrine: Negative.   Genitourinary: Negative.  Negative for difficulty urinating, dysuria, frequency and hematuria.   Musculoskeletal:  Positive for arthralgias (of the right knee, shoulder and back). Negative for back pain, flank pain, gait problem and myalgias.  Skin: Negative.   Neurological: Negative.  Negative for dizziness, extremity weakness, gait problem, headaches, light-headedness, numbness, seizures and speech difficulty.  Hematological: Negative.   Psychiatric/Behavioral: Negative.  Negative for depression and sleep disturbance. The patient is not nervous/anxious.   All other systems reviewed and are negative.   VITALS:  Blood pressure 133/79, pulse 98, temperature 98.6 F (37 C), temperature source Oral, resp. rate 18, height 6' (1.829 m), weight 187  lb 4.8 oz (85 kg), SpO2 99 %.  Wt Readings from Last 3 Encounters:  01/11/21 187 lb 4.8 oz (85 kg)  01/04/21 198 lb (89.8 kg)  12/28/20 198 lb 4 oz (89.9 kg)    Body mass index is 25.4 kg/m.  Performance status (ECOG): 1 - Symptomatic but completely ambulatory  PHYSICAL EXAM:  Physical Exam Constitutional:      General: He is not in acute distress.    Appearance: Normal appearance. He is normal weight.  HENT:     Head: Normocephalic and atraumatic.     Mouth/Throat:     Pharynx: Posterior oropharyngeal erythema present.     Comments: White coating on the upper palate and some on the tongue. Eyes:     General: No scleral icterus.    Extraocular Movements: Extraocular movements intact.     Conjunctiva/sclera: Conjunctivae normal.     Pupils: Pupils are equal, round, and reactive to light.  Cardiovascular:     Rate and Rhythm: Normal rate and regular rhythm.     Pulses: Normal pulses.     Heart sounds: Normal  heart sounds. No murmur heard.   No friction rub. No gallop.  Pulmonary:     Effort: Pulmonary effort is normal. No respiratory distress.     Breath sounds: Normal breath sounds.  Abdominal:     General: Bowel sounds are normal. There is no distension.     Palpations: Abdomen is soft. There is no hepatomegaly, splenomegaly or mass.     Tenderness: There is no abdominal tenderness.  Musculoskeletal:        General: Normal range of motion.     Cervical back: Normal range of motion and neck supple.     Right lower leg: No edema.     Left lower leg: No edema.  Lymphadenopathy:     Cervical: No cervical adenopathy.  Skin:    General: Skin is warm and dry.  Neurological:     General: No focal deficit present.     Mental Status: He is alert and oriented to person, place, and time. Mental status is at baseline.  Psychiatric:        Mood and Affect: Mood normal.        Behavior: Behavior normal.        Thought Content: Thought content normal.        Judgment: Judgment normal.    LABS:   CBC Latest Ref Rng & Units 01/11/2021 12/24/2020 12/18/2020  WBC - 6.8 8.4 6.8  Hemoglobin 13.5 - 17.5 11.4(A) 12.1(A) 11.5(A)  Hematocrit 41 - 53 35(A) 37(A) 36(A)  Platelets 150 - 399 347 301 268   CMP Latest Ref Rng & Units 01/11/2021 12/24/2020 12/18/2020  Glucose 70 - 99 mg/dL - - -  BUN 4 - 21 12 15 10   Creatinine 0.6 - 1.3 0.8 0.7 0.6  Sodium 137 - 147 137 141 141  Potassium 3.4 - 5.3 4.1 3.8 4.6  Chloride 99 - 108 102 106 108  CO2 13 - 22 24(A) 27(A) 20  Calcium 8.7 - 10.7 9.3 9.1 9.3  Total Protein 6.0 - 8.3 g/dL - - -  Total Bilirubin 0.2 - 1.2 mg/dL - - -  Alkaline Phos 25 - 125 66 63 66  AST 14 - 40 29 36 26  ALT 10 - 40 22 24 17     Lab Results  Component Value Date   TIBC 462 (H) 12/03/2020   FERRITIN 60 12/03/2020   IRONPCTSAT 16 (  L) 12/03/2020   IRONPCTSAT 22.8 03/11/2019   IRONPCTSAT 46.6 06/28/2016   No results found for: LDH  STUDIES:  No results found.     I,  Rita Ohara, am acting as scribe for [name], MD  I have reviewed this report as typed by the medical scribe, and it is complete and accurate.

## 2021-01-01 ENCOUNTER — Inpatient Hospital Stay: Payer: Medicare Other

## 2021-01-01 ENCOUNTER — Encounter: Payer: Self-pay | Admitting: Oncology

## 2021-01-01 DIAGNOSIS — C140 Malignant neoplasm of pharynx, unspecified: Secondary | ICD-10-CM

## 2021-01-01 MED FILL — Dexamethasone Sodium Phosphate Inj 100 MG/10ML: INTRAMUSCULAR | Qty: 1 | Status: AC

## 2021-01-01 MED FILL — Cisplatin Inj 100 MG/100ML (1 MG/ML): INTRAVENOUS | Qty: 85 | Status: AC

## 2021-01-01 MED FILL — Fosaprepitant Dimeglumine For IV Infusion 150 MG (Base Eq): INTRAVENOUS | Qty: 5 | Status: AC

## 2021-01-04 ENCOUNTER — Inpatient Hospital Stay: Payer: Medicare Other

## 2021-01-04 ENCOUNTER — Other Ambulatory Visit: Payer: Self-pay

## 2021-01-04 VITALS — BP 142/73 | HR 85 | Temp 98.1°F | Resp 18 | Ht 72.0 in | Wt 198.0 lb

## 2021-01-04 DIAGNOSIS — Z5111 Encounter for antineoplastic chemotherapy: Secondary | ICD-10-CM | POA: Diagnosis not present

## 2021-01-04 DIAGNOSIS — C140 Malignant neoplasm of pharynx, unspecified: Secondary | ICD-10-CM

## 2021-01-04 MED ORDER — HEPARIN SOD (PORK) LOCK FLUSH 100 UNIT/ML IV SOLN
500.0000 [IU] | Freq: Once | INTRAVENOUS | Status: AC | PRN
  Administered 2021-01-04: 500 [IU]

## 2021-01-04 MED ORDER — SODIUM CHLORIDE 0.9 % IV SOLN
39.5000 mg/m2 | Freq: Once | INTRAVENOUS | Status: AC
  Administered 2021-01-04: 85 mg via INTRAVENOUS
  Filled 2021-01-04: qty 85

## 2021-01-04 MED ORDER — SODIUM CHLORIDE 0.9 % IV SOLN
150.0000 mg | Freq: Once | INTRAVENOUS | Status: AC
  Administered 2021-01-04: 150 mg via INTRAVENOUS
  Filled 2021-01-04: qty 150

## 2021-01-04 MED ORDER — SODIUM CHLORIDE 0.9% FLUSH
10.0000 mL | INTRAVENOUS | Status: DC | PRN
  Administered 2021-01-04: 10 mL

## 2021-01-04 MED ORDER — MAGNESIUM SULFATE 4 GM/100ML IV SOLN
4.0000 g | Freq: Once | INTRAVENOUS | Status: AC
  Administered 2021-01-04: 4 g via INTRAVENOUS
  Filled 2021-01-04: qty 100

## 2021-01-04 MED ORDER — SODIUM CHLORIDE 0.9 % IV SOLN
10.0000 mg | Freq: Once | INTRAVENOUS | Status: AC
  Administered 2021-01-04: 10 mg via INTRAVENOUS
  Filled 2021-01-04: qty 10

## 2021-01-04 MED ORDER — POTASSIUM CHLORIDE IN NACL 20-0.9 MEQ/L-% IV SOLN
Freq: Once | INTRAVENOUS | Status: AC
  Filled 2021-01-04: qty 4000

## 2021-01-04 MED ORDER — PALONOSETRON HCL INJECTION 0.25 MG/5ML
0.2500 mg | Freq: Once | INTRAVENOUS | Status: AC
  Administered 2021-01-04: 0.25 mg via INTRAVENOUS
  Filled 2021-01-04: qty 5

## 2021-01-04 MED ORDER — SODIUM CHLORIDE 0.9 % IV SOLN: Freq: Once | INTRAVENOUS | Status: AC

## 2021-01-04 NOTE — Patient Instructions (Signed)
[facility]  Discharge Instructions: Thank you for choosing [facility] to provide your oncology and hematology care.  If you have a lab appointment with the [facility], please go directly to the [facility] and check in at the registration area.   Wear comfortable clothing and clothing appropriate for easy access to any Portacath or PICC line.   We strive to give you quality time with your provider. You may need to reschedule your appointment if you arrive late (15 or more minutes).  Arriving late affects you and other patients whose appointments are after yours.  Also, if you miss three or more appointments without notifying the office, you may be dismissed from the clinic at the provider's discretion.      For prescription refill requests, have your pharmacy contact our office and allow 72 hours for refills to be completed.    Today you received the following chemotherapy and/or immunotherapy agents cisplatin      To help prevent nausea and vomiting after your treatment, we encourage you to take your nausea medication as directed.  BELOW ARE SYMPTOMS THAT SHOULD BE REPORTED IMMEDIATELY: *FEVER GREATER THAN 100.4 F (38 C) OR HIGHER *CHILLS OR SWEATING *NAUSEA AND VOMITING THAT IS NOT CONTROLLED WITH YOUR NAUSEA MEDICATION *UNUSUAL SHORTNESS OF BREATH *UNUSUAL BRUISING OR BLEEDING *URINARY PROBLEMS (pain or burning when urinating, or frequent urination) *BOWEL PROBLEMS (unusual diarrhea, constipation, pain near the anus) TENDERNESS IN MOUTH AND THROAT WITH OR WITHOUT PRESENCE OF ULCERS (sore throat, sores in mouth, or a toothache) UNUSUAL RASH, SWELLING OR PAIN  UNUSUAL VAGINAL DISCHARGE OR ITCHING   Items with * indicate a potential emergency and should be followed up as soon as possible or go to the Emergency Department if any problems should occur.  Please show the CHEMOTHERAPY ALERT CARD or IMMUNOTHERAPY ALERT CARD at check-in to the  Emergency Department and triage nurse.  Should you have questions after your visit or need to cancel or reschedule your appointment, please contact [facility]  Dept: [phone]  and follow the prompts.  Office hours are 8:00 a.m. to 4:30 p.m. Monday - Friday. Please note that voicemails left after 4:00 p.m. may not be returned until the following business day.  We are closed weekends and major holidays. You have access to a nurse at all times for urgent questions. Please call the main number to the clinic Dept: [phone] and follow the prompts.  For any non-urgent questions, you may also contact your provider using MyChart. We now offer e-Visits for anyone 35 and older to request care online for non-urgent symptoms. For details visit [email or web address].   Also download the MyChart app! Go to the app store, search "MyChart", open the app, select [facility], and log in with your MyChart username and password.  Due to Covid, a mask is required upon entering the hospital/clinic. If you do not have a mask, one will be given to you upon arrival. For doctor visits, patients may have 1 support person aged 71 or older with them. For treatment visits, patients cannot have anyone with them due to current Covid guidelines and our immunocompromised population.

## 2021-01-11 ENCOUNTER — Telehealth: Payer: Self-pay | Admitting: Internal Medicine

## 2021-01-11 ENCOUNTER — Other Ambulatory Visit: Payer: Self-pay | Admitting: Hematology and Oncology

## 2021-01-11 ENCOUNTER — Telehealth: Payer: Self-pay

## 2021-01-11 ENCOUNTER — Inpatient Hospital Stay: Payer: Medicare Other

## 2021-01-11 ENCOUNTER — Other Ambulatory Visit: Payer: Self-pay

## 2021-01-11 ENCOUNTER — Ambulatory Visit: Payer: Medicare Other

## 2021-01-11 ENCOUNTER — Encounter: Payer: Self-pay | Admitting: Oncology

## 2021-01-11 ENCOUNTER — Inpatient Hospital Stay (INDEPENDENT_AMBULATORY_CARE_PROVIDER_SITE_OTHER): Payer: Medicare Other | Admitting: Oncology

## 2021-01-11 VITALS — BP 133/79 | HR 98 | Temp 98.6°F | Resp 18 | Ht 72.0 in | Wt 187.3 lb

## 2021-01-11 DIAGNOSIS — C140 Malignant neoplasm of pharynx, unspecified: Secondary | ICD-10-CM

## 2021-01-11 LAB — BASIC METABOLIC PANEL
BUN: 12 (ref 4–21)
CO2: 24 — AB (ref 13–22)
Chloride: 102 (ref 99–108)
Creatinine: 0.8 (ref 0.6–1.3)
Glucose: 145
Potassium: 4.1 (ref 3.4–5.3)
Sodium: 137 (ref 137–147)

## 2021-01-11 LAB — CBC AND DIFFERENTIAL
HCT: 35 — AB (ref 41–53)
Hemoglobin: 11.4 — AB (ref 13.5–17.5)
Neutrophils Absolute: 5.51
Platelets: 347 (ref 150–399)
WBC: 6.8

## 2021-01-11 LAB — HEPATIC FUNCTION PANEL
ALT: 22 (ref 10–40)
AST: 29 (ref 14–40)
Alkaline Phosphatase: 66 (ref 25–125)
Bilirubin, Total: 0.6

## 2021-01-11 LAB — COMPREHENSIVE METABOLIC PANEL
Albumin: 4.5 (ref 3.5–5.0)
Calcium: 9.3 (ref 8.7–10.7)

## 2021-01-11 LAB — MAGNESIUM: Magnesium: 1.8

## 2021-01-11 LAB — CBC: RBC: 4.33 (ref 3.87–5.11)

## 2021-01-11 MED ORDER — ACCU-CHEK GUIDE ME W/DEVICE KIT: PACK | 0 refills | Status: DC

## 2021-01-11 MED ORDER — LANCETS MISC: 12 refills | Status: DC

## 2021-01-11 MED ORDER — ACCU-CHEK GUIDE VI STRP: ORAL_STRIP | 12 refills | Status: DC
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
## 2021-01-11 NOTE — Progress Notes (Signed)
Patient flushed feeding tube with me watching him. Flushed with 59ml of water. Dressing around tube also cleaned and changed.

## 2021-01-11 NOTE — Telephone Encounter (Signed)
Called patient twice and patient hung up the phone both times , saying take my name off you list. Patient may reschedule next available appointment.   L.Izayiah Tibbitts,LPN

## 2021-01-11 NOTE — Telephone Encounter (Signed)
1.Medication Requested: Accu-Chek monitor and lancets, Hydrocodone, and brimonidine-timolol (COMBIGAN) 0.2-0.5 % ophthalmic solution 2. Pharmacy (Name[facility]): [place], [place] Phone:  [phone]  Fax:  [phone]     3. On Med List: N and Y (eye drops)   4. Last Visit with PCP: 04/29/2020  5. Next visit date with PCP: 01/28/2021   Agent: Please be advised that RX refills may take up to 3 business days. We ask that you follow-up with your pharmacy.

## 2021-01-11 NOTE — Telephone Encounter (Signed)
Ok to contact pt  Ok for [employer] and supplies  I am no longer able to prescribe hydrocodone for chronic pain  Please see optho for further refills of his eye drop medication

## 2021-01-12 ENCOUNTER — Inpatient Hospital Stay: Payer: Medicare Other

## 2021-01-12 DIAGNOSIS — Z5111 Encounter for antineoplastic chemotherapy: Secondary | ICD-10-CM | POA: Diagnosis not present

## 2021-01-12 DIAGNOSIS — C140 Malignant neoplasm of pharynx, unspecified: Secondary | ICD-10-CM

## 2021-01-12 MED ORDER — SODIUM CHLORIDE 0.9 % IV SOLN
Freq: Once | INTRAVENOUS | Status: AC
Start: 2021-01-12 — End: 2021-01-12

## 2021-01-12 MED ORDER — SODIUM CHLORIDE 0.9 % IV SOLN
39.5000 mg/m2 | Freq: Once | INTRAVENOUS | Status: AC
  Administered 2021-01-12: 85 mg via INTRAVENOUS
  Filled 2021-01-12: qty 85

## 2021-01-12 MED ORDER — POTASSIUM CHLORIDE IN NACL 20-0.9 MEQ/L-% IV SOLN: Freq: Once | INTRAVENOUS | Status: AC

## 2021-01-12 MED ORDER — SODIUM CHLORIDE 0.9 % IV SOLN
10.0000 mg | Freq: Once | INTRAVENOUS | Status: AC
  Administered 2021-01-12: 10 mg via INTRAVENOUS
  Filled 2021-01-12: qty 10

## 2021-01-12 MED ORDER — SODIUM CHLORIDE 0.9% FLUSH
10.0000 mL | INTRAVENOUS | Status: DC | PRN
  Administered 2021-01-12: 10 mL

## 2021-01-12 MED ORDER — SODIUM CHLORIDE 0.9 % IV SOLN
150.0000 mg | Freq: Once | INTRAVENOUS | Status: AC
  Administered 2021-01-12: 150 mg via INTRAVENOUS
  Filled 2021-01-12: qty 150

## 2021-01-12 MED ORDER — HEPARIN SOD (PORK) LOCK FLUSH 100 UNIT/ML IV SOLN
500.0000 [IU] | Freq: Once | INTRAVENOUS | Status: AC | PRN
  Administered 2021-01-12: 500 [IU]

## 2021-01-12 MED ORDER — MAGNESIUM SULFATE 2 GM/50ML IV SOLN
2.0000 g | Freq: Once | INTRAVENOUS | Status: AC
  Administered 2021-01-12: 2 g via INTRAVENOUS

## 2021-01-12 MED ORDER — PALONOSETRON HCL INJECTION 0.25 MG/5ML
0.2500 mg | Freq: Once | INTRAVENOUS | Status: AC
  Administered 2021-01-12: 0.25 mg via INTRAVENOUS

## 2021-01-12 NOTE — Progress Notes (Signed)
Called into [facility].

## 2021-01-12 NOTE — Patient Instructions (Signed)
Cisplatin injection What is this medication? CISPLATIN (SIS pla tin) is a chemotherapy drug. It targets fast dividing cells, like cancer cells, and causes these cells to die. This medicine is used to treat many types of cancer like bladder, ovarian, and testicular cancers. This medicine may be used for other purposes; ask your health care provider or pharmacist if you have questions. COMMON BRAND NAME(S): Platinol, Platinol -AQ What should I tell my care team before I take this medication? They need to know if you have any of these conditions: eye disease, vision problems hearing problems kidney disease low blood counts, like white cells, platelets, or red blood cells tingling of the fingers or toes, or other nerve disorder an unusual or allergic reaction to cisplatin, carboplatin, oxaliplatin, other medicines, foods, dyes, or preservatives pregnant or trying to get pregnant breast-feeding How should I use this medication? This drug is given as an infusion into a vein. It is administered in a hospital or clinic by a specially trained health care professional. Talk to your pediatrician regarding the use of this medicine in children. Special care may be needed. Overdosage: If you think you have taken too much of this medicine contact a poison control center or emergency room at once. NOTE: This medicine is only for you. Do not share this medicine with others. What if I miss a dose? It is important not to miss a dose. Call your doctor or health care professional if you are unable to keep an appointment. What may interact with this medication? This medicine may interact with the following medications: foscarnet certain antibiotics like amikacin, gentamicin, neomycin, polymyxin B, streptomycin, tobramycin, vancomycin This list may not describe all possible interactions. Give your health care provider a list of all the medicines, herbs, non-prescription drugs, or dietary supplements you use. Also  tell them if you smoke, drink alcohol, or use illegal drugs. Some items may interact with your medicine. What should I watch for while using this medication? Your condition will be monitored carefully while you are receiving this medicine. You will need important blood work done while you are taking this medicine. This drug may make you feel generally unwell. This is not uncommon, as chemotherapy can affect healthy cells as well as cancer cells. Report any side effects. Continue your course of treatment even though you feel ill unless your doctor tells you to stop. This medicine may increase your risk of getting an infection. Call your healthcare professional for advice if you get a fever, chills, or sore throat, or other symptoms of a cold or flu. Do not treat yourself. Try to avoid being around people who are sick. Avoid taking medicines that contain aspirin, acetaminophen, ibuprofen, naproxen, or ketoprofen unless instructed by your healthcare professional. These medicines may hide a fever. This medicine may increase your risk to bruise or bleed. Call your doctor or health care professional if you notice any unusual bleeding. Be careful brushing and flossing your teeth or using a toothpick because you may get an infection or bleed more easily. If you have any dental work done, tell your dentist you are receiving this medicine. Do not become pregnant while taking this medicine or for 14 months after stopping it. Women should inform their healthcare professional if they wish to become pregnant or think they might be pregnant. Men should not father a child while taking this medicine and for 11 months after stopping it. There is potential for serious side effects to an unborn child. Talk to your healthcare  professional for more information. Do not breast-feed an infant while taking this medicine. This medicine has caused ovarian failure in some women. This medicine may make it more difficult to get  pregnant. Talk to your healthcare professional if you are concerned about your fertility. This medicine has caused decreased sperm counts in some men. This may make it more difficult to father a child. Talk to your healthcare professional if you are concerned about your fertility. Drink fluids as directed while you are taking this medicine. This will help protect your kidneys. Call your doctor or health care professional if you get diarrhea. Do not treat yourself. What side effects may I notice from receiving this medication? Side effects that you should report to your doctor or health care professional as soon as possible: allergic reactions like skin rash, itching or hives, swelling of the face, lips, or tongue blurred vision changes in vision decreased hearing or ringing of the ears nausea, vomiting pain, redness, or irritation at site where injected pain, tingling, numbness in the hands or feet signs and symptoms of bleeding such as bloody or black, tarry stools; red or dark brown urine; spitting up blood or brown material that looks like coffee grounds; red spots on the skin; unusual bruising or bleeding from the eyes, gums, or nose signs and symptoms of infection like fever; chills; cough; sore throat; pain or trouble passing urine signs and symptoms of kidney injury like trouble passing urine or change in the amount of urine signs and symptoms of low red blood cells or anemia such as unusually weak or tired; feeling faint or lightheaded; falls; breathing problems Side effects that usually do not require medical attention (report to your doctor or health care professional if they continue or are bothersome): loss of appetite mouth sores muscle cramps This list may not describe all possible side effects. Call your doctor for medical advice about side effects. You may report side effects to FDA at 1-800-FDA-1088. Where should I keep my medication? This drug is given in a hospital or clinic  and will not be stored at home. NOTE: This sheet is a summary. It may not cover all possible information. If you have questions about this medicine, talk to your doctor, pharmacist, or health care provider.  2022 Elsevier/Gold Standard (2020-10-20 00:00:00)

## 2021-01-14 NOTE — Progress Notes (Signed)
One of the churches out of [place] is going to pay for David Kelly to stay at the [facility] for 2 weeks, starting today. I called and let patient know, told him to call me if he has any issues when he gets there. Patient is still working on getting permanent housing, told him I would assist anyway possible.

## 2021-01-14 NOTE — Progress Notes (Signed)
Spoke with patient, he has not gone by [address] at this time. Dr. Hinton Rao did write him a letter stating how important housing is for patient that he could take with him when he goes. Today I provided him with a few [employer] and a gas card, patient is approved for the [employer]. Currently we are trying to work with a church to get patient into a extended stay hotel. He is looking for more long term options as well.

## 2021-01-15 ENCOUNTER — Encounter: Payer: Self-pay | Admitting: Oncology

## 2021-01-15 ENCOUNTER — Inpatient Hospital Stay: Payer: Medicare Other | Attending: Hematology and Oncology

## 2021-01-15 ENCOUNTER — Other Ambulatory Visit: Payer: Self-pay | Admitting: Hematology and Oncology

## 2021-01-15 DIAGNOSIS — C140 Malignant neoplasm of pharynx, unspecified: Secondary | ICD-10-CM | POA: Insufficient documentation

## 2021-01-15 DIAGNOSIS — E860 Dehydration: Secondary | ICD-10-CM | POA: Insufficient documentation

## 2021-01-15 DIAGNOSIS — Z5111 Encounter for antineoplastic chemotherapy: Secondary | ICD-10-CM | POA: Insufficient documentation

## 2021-01-15 DIAGNOSIS — D539 Nutritional anemia, unspecified: Secondary | ICD-10-CM | POA: Insufficient documentation

## 2021-01-15 DIAGNOSIS — E785 Hyperlipidemia, unspecified: Secondary | ICD-10-CM | POA: Insufficient documentation

## 2021-01-15 DIAGNOSIS — E46 Unspecified protein-calorie malnutrition: Secondary | ICD-10-CM | POA: Insufficient documentation

## 2021-01-15 DIAGNOSIS — Z79899 Other long term (current) drug therapy: Secondary | ICD-10-CM | POA: Insufficient documentation

## 2021-01-15 LAB — BASIC METABOLIC PANEL
BUN: 21 (ref 4–21)
CO2: 24 — AB (ref 13–22)
Chloride: 103 (ref 99–108)
Creatinine: 0.8 (ref 0.6–1.3)
Glucose: 204
Potassium: 4 (ref 3.4–5.3)
Sodium: 138 (ref 137–147)

## 2021-01-15 LAB — HEPATIC FUNCTION PANEL
ALT: 21 (ref 10–40)
AST: 26 (ref 14–40)
Alkaline Phosphatase: 58 (ref 25–125)
Bilirubin, Total: 0.6

## 2021-01-15 LAB — COMPREHENSIVE METABOLIC PANEL
Albumin: 4.3 (ref 3.5–5.0)
Calcium: 9 (ref 8.7–10.7)

## 2021-01-15 LAB — CBC AND DIFFERENTIAL
HCT: 35 — AB (ref 41–53)
Hemoglobin: 11.1 — AB (ref 13.5–17.5)
Neutrophils Absolute: 4.18
Platelets: 305 (ref 150–399)
WBC: 5.1

## 2021-01-15 LAB — CBC: RBC: 4.28 (ref 3.87–5.11)

## 2021-01-15 LAB — MAGNESIUM: Magnesium: 1.6

## 2021-01-15 MED FILL — Dexamethasone Sodium Phosphate Inj 100 MG/10ML: INTRAMUSCULAR | Qty: 1 | Status: AC

## 2021-01-15 MED FILL — Fosaprepitant Dimeglumine For IV Infusion 150 MG (Base Eq): INTRAVENOUS | Qty: 5 | Status: AC

## 2021-01-15 MED FILL — Cisplatin Inj 100 MG/100ML (1 MG/ML): INTRAVENOUS | Qty: 85 | Status: AC

## 2021-01-15 NOTE — Telephone Encounter (Signed)
Patient notified

## 2021-01-16 ENCOUNTER — Encounter: Payer: Self-pay | Admitting: Oncology

## 2021-01-18 ENCOUNTER — Other Ambulatory Visit: Payer: Self-pay

## 2021-01-18 ENCOUNTER — Inpatient Hospital Stay: Payer: Medicare Other

## 2021-01-18 VITALS — BP 113/75 | HR 110 | Temp 98.5°F | Resp 18 | Ht 72.0 in | Wt 184.1 lb

## 2021-01-18 DIAGNOSIS — E46 Unspecified protein-calorie malnutrition: Secondary | ICD-10-CM | POA: Diagnosis not present

## 2021-01-18 DIAGNOSIS — D539 Nutritional anemia, unspecified: Secondary | ICD-10-CM | POA: Diagnosis not present

## 2021-01-18 DIAGNOSIS — C140 Malignant neoplasm of pharynx, unspecified: Secondary | ICD-10-CM | POA: Diagnosis present

## 2021-01-18 DIAGNOSIS — Z5111 Encounter for antineoplastic chemotherapy: Secondary | ICD-10-CM | POA: Diagnosis present

## 2021-01-18 DIAGNOSIS — E785 Hyperlipidemia, unspecified: Secondary | ICD-10-CM | POA: Diagnosis not present

## 2021-01-18 DIAGNOSIS — E860 Dehydration: Secondary | ICD-10-CM | POA: Diagnosis not present

## 2021-01-18 DIAGNOSIS — Z79899 Other long term (current) drug therapy: Secondary | ICD-10-CM | POA: Diagnosis not present

## 2021-01-18 MED ORDER — SODIUM CHLORIDE 0.9 % IV SOLN
10.0000 mg | Freq: Once | INTRAVENOUS | Status: AC
  Administered 2021-01-18: 10 mg via INTRAVENOUS
  Filled 2021-01-18: qty 10

## 2021-01-18 MED ORDER — HEPARIN SOD (PORK) LOCK FLUSH 100 UNIT/ML IV SOLN
500.0000 [IU] | Freq: Once | INTRAVENOUS | Status: AC | PRN
  Administered 2021-01-18: 500 [IU]

## 2021-01-18 MED ORDER — SODIUM CHLORIDE 0.9% FLUSH
10.0000 mL | INTRAVENOUS | Status: DC | PRN
  Administered 2021-01-18: 10 mL

## 2021-01-18 MED ORDER — POTASSIUM CHLORIDE IN NACL 20-0.9 MEQ/L-% IV SOLN
Freq: Once | INTRAVENOUS | Status: AC
  Filled 2021-01-18: qty 1000

## 2021-01-18 MED ORDER — PALONOSETRON HCL INJECTION 0.25 MG/5ML
0.2500 mg | Freq: Once | INTRAVENOUS | Status: AC
  Administered 2021-01-18: 0.25 mg via INTRAVENOUS
  Filled 2021-01-18: qty 5

## 2021-01-18 MED ORDER — SODIUM CHLORIDE 0.9 % IV SOLN
150.0000 mg | Freq: Once | INTRAVENOUS | Status: AC
  Administered 2021-01-18: 150 mg via INTRAVENOUS
  Filled 2021-01-18: qty 150

## 2021-01-18 MED ORDER — SODIUM CHLORIDE 0.9 % IV SOLN
39.5000 mg/m2 | Freq: Once | INTRAVENOUS | Status: AC
  Administered 2021-01-18: 85 mg via INTRAVENOUS
  Filled 2021-01-18: qty 85

## 2021-01-18 MED ORDER — SODIUM CHLORIDE 0.9 % IV SOLN: Freq: Once | INTRAVENOUS | Status: AC

## 2021-01-18 MED ORDER — MAGNESIUM SULFATE 2 GM/50ML IV SOLN
2.0000 g | Freq: Once | INTRAVENOUS | Status: AC
  Administered 2021-01-18: 2 g via INTRAVENOUS
  Filled 2021-01-18: qty 50

## 2021-01-18 NOTE — Patient Instructions (Signed)
Cisplatin injection What is this medication? CISPLATIN (SIS pla tin) is a chemotherapy drug. It targets fast dividing cells, like cancer cells, and causes these cells to die. This medicine is used to treat many types of cancer like bladder, ovarian, and testicular cancers. This medicine may be used for other purposes; ask your health care provider or pharmacist if you have questions. COMMON BRAND NAME(S): Platinol, Platinol -AQ What should I tell my care team before I take this medication? They need to know if you have any of these conditions: eye disease, vision problems hearing problems kidney disease low blood counts, like white cells, platelets, or red blood cells tingling of the fingers or toes, or other nerve disorder an unusual or allergic reaction to cisplatin, carboplatin, oxaliplatin, other medicines, foods, dyes, or preservatives pregnant or trying to get pregnant breast-feeding How should I use this medication? This drug is given as an infusion into a vein. It is administered in a hospital or clinic by a specially trained health care professional. Talk to your pediatrician regarding the use of this medicine in children. Special care may be needed. Overdosage: If you think you have taken too much of this medicine contact a poison control center or emergency room at once. NOTE: This medicine is only for you. Do not share this medicine with others. What if I miss a dose? It is important not to miss a dose. Call your doctor or health care professional if you are unable to keep an appointment. What may interact with this medication? This medicine may interact with the following medications: foscarnet certain antibiotics like amikacin, gentamicin, neomycin, polymyxin B, streptomycin, tobramycin, vancomycin This list may not describe all possible interactions. Give your health care provider a list of all the medicines, herbs, non-prescription drugs, or dietary supplements you use. Also  tell them if you smoke, drink alcohol, or use illegal drugs. Some items may interact with your medicine. What should I watch for while using this medication? Your condition will be monitored carefully while you are receiving this medicine. You will need important blood work done while you are taking this medicine. This drug may make you feel generally unwell. This is not uncommon, as chemotherapy can affect healthy cells as well as cancer cells. Report any side effects. Continue your course of treatment even though you feel ill unless your doctor tells you to stop. This medicine may increase your risk of getting an infection. Call your healthcare professional for advice if you get a fever, chills, or sore throat, or other symptoms of a cold or flu. Do not treat yourself. Try to avoid being around people who are sick. Avoid taking medicines that contain aspirin, acetaminophen, ibuprofen, naproxen, or ketoprofen unless instructed by your healthcare professional. These medicines may hide a fever. This medicine may increase your risk to bruise or bleed. Call your doctor or health care professional if you notice any unusual bleeding. Be careful brushing and flossing your teeth or using a toothpick because you may get an infection or bleed more easily. If you have any dental work done, tell your dentist you are receiving this medicine. Do not become pregnant while taking this medicine or for 14 months after stopping it. Women should inform their healthcare professional if they wish to become pregnant or think they might be pregnant. Men should not father a child while taking this medicine and for 11 months after stopping it. There is potential for serious side effects to an unborn child. Talk to your healthcare  professional for more information. Do not breast-feed an infant while taking this medicine. This medicine has caused ovarian failure in some women. This medicine may make it more difficult to get  pregnant. Talk to your healthcare professional if you are concerned about your fertility. This medicine has caused decreased sperm counts in some men. This may make it more difficult to father a child. Talk to your healthcare professional if you are concerned about your fertility. Drink fluids as directed while you are taking this medicine. This will help protect your kidneys. Call your doctor or health care professional if you get diarrhea. Do not treat yourself. What side effects may I notice from receiving this medication? Side effects that you should report to your doctor or health care professional as soon as possible: allergic reactions like skin rash, itching or hives, swelling of the face, lips, or tongue blurred vision changes in vision decreased hearing or ringing of the ears nausea, vomiting pain, redness, or irritation at site where injected pain, tingling, numbness in the hands or feet signs and symptoms of bleeding such as bloody or black, tarry stools; red or dark brown urine; spitting up blood or brown material that looks like coffee grounds; red spots on the skin; unusual bruising or bleeding from the eyes, gums, or nose signs and symptoms of infection like fever; chills; cough; sore throat; pain or trouble passing urine signs and symptoms of kidney injury like trouble passing urine or change in the amount of urine signs and symptoms of low red blood cells or anemia such as unusually weak or tired; feeling faint or lightheaded; falls; breathing problems Side effects that usually do not require medical attention (report to your doctor or health care professional if they continue or are bothersome): loss of appetite mouth sores muscle cramps This list may not describe all possible side effects. Call your doctor for medical advice about side effects. You may report side effects to FDA at 1-800-FDA-1088. Where should I keep my medication? This drug is given in a hospital or clinic  and will not be stored at home. NOTE: This sheet is a summary. It may not cover all possible information. If you have questions about this medicine, talk to your doctor, pharmacist, or health care provider.  2022 Elsevier/Gold Standard (2020-10-20 00:00:00)

## 2021-01-22 ENCOUNTER — Inpatient Hospital Stay: Payer: Medicare Other

## 2021-01-22 ENCOUNTER — Telehealth: Payer: Self-pay | Admitting: Oncology

## 2021-01-22 ENCOUNTER — Encounter: Payer: Self-pay | Admitting: Oncology

## 2021-01-22 ENCOUNTER — Other Ambulatory Visit: Payer: Self-pay

## 2021-01-22 ENCOUNTER — Inpatient Hospital Stay (INDEPENDENT_AMBULATORY_CARE_PROVIDER_SITE_OTHER): Payer: Medicare Other | Admitting: Oncology

## 2021-01-22 ENCOUNTER — Other Ambulatory Visit: Payer: Self-pay | Admitting: Pharmacist

## 2021-01-22 VITALS — BP 113/72 | HR 104 | Temp 98.4°F | Resp 18 | Ht 72.0 in | Wt 180.1 lb

## 2021-01-22 DIAGNOSIS — C140 Malignant neoplasm of pharynx, unspecified: Secondary | ICD-10-CM | POA: Diagnosis not present

## 2021-01-22 DIAGNOSIS — D539 Nutritional anemia, unspecified: Secondary | ICD-10-CM | POA: Diagnosis not present

## 2021-01-22 LAB — COMPREHENSIVE METABOLIC PANEL
Albumin: 4.4 (ref 3.5–5.0)
Calcium: 9.6 (ref 8.7–10.7)

## 2021-01-22 LAB — HEPATIC FUNCTION PANEL
ALT: 25 (ref 10–40)
AST: 27 (ref 14–40)
Alkaline Phosphatase: 55 (ref 25–125)
Bilirubin, Total: 0.5

## 2021-01-22 LAB — BASIC METABOLIC PANEL
BUN: 25 — AB (ref 4–21)
CO2: 26 — AB (ref 13–22)
Chloride: 99 (ref 99–108)
Creatinine: 0.8 (ref 0.6–1.3)
Glucose: 174
Potassium: 4.1 (ref 3.4–5.3)
Sodium: 137 (ref 137–147)

## 2021-01-22 LAB — CBC AND DIFFERENTIAL
HCT: 35 — AB (ref 41–53)
Hemoglobin: 11.7 — AB (ref 13.5–17.5)
Neutrophils Absolute: 2.48
Platelets: 276 (ref 150–399)
WBC: 3.4

## 2021-01-22 LAB — CBC: RBC: 4.34 (ref 3.87–5.11)

## 2021-01-22 MED FILL — Cisplatin Inj 100 MG/100ML (1 MG/ML): INTRAVENOUS | Qty: 85 | Status: AC

## 2021-01-22 MED FILL — Dexamethasone Sodium Phosphate Inj 100 MG/10ML: INTRAMUSCULAR | Qty: 1 | Status: AC

## 2021-01-22 MED FILL — Fosaprepitant Dimeglumine For IV Infusion 150 MG (Base Eq): INTRAVENOUS | Qty: 5 | Status: AC

## 2021-01-22 NOTE — Progress Notes (Signed)
[facility]  [address] [place],  [place]  [address] [phone]  Clinic Day:  01/22/2021  Referring physician: Biagio Borg, MD  This document serves as a record of services personally performed by [name], MD. It was created on their behalf by Curry,Lauren E, a trained medical scribe. The creation of this record is based on the scribe's personal observations and the provider's statements to them.  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  He has a mass involving the right lateral pharyngeal wall inferior to the tonsillar region and extending to the midline of the vallecular. He is receiving concurrent chemoradiation with weekly cisplatin. He is tolerating this fairly well, but he has issues with malnutrition and weight loss.  Solitary hypermetabolic metastatic lymph node in the right level 2 nodal region, measuring 3.2 cm.  Anemia. He does have a history of B12 deficiency. Iron studies are consistent with iron deficiency and we recommend iron supplement daily.  Noncompliance with tube feedings. I again explained the importance of this and that it is temporary. He has been doing more feedings at home. We will have the nurses continue to work with him regarding this.  Homeless. He was sleeping in his car and is currently residing in a motel. He has met with the social worker, Evelena Peat, to discuss options and support.   He is receiving concurrent chemoradiaton with weekly cisplatin and tolerating fairly well.  However, with his weight loss, I again explained the importance of compliance with his tube feedings. The nurses will continue to meet with him regularly to assist with dressing changes and tube flushing and care and encourage him to use this. He will proceed with a 6th cycle of chemotherapy on Monday, and he will continue with daily radiation. We will see  him back in 1 week with CBC and CMP for repeat evaluation prior to a 7th and final cycle, especially since he has developed leukopenia but not neutropenia. We will then see him back in 2 weeks later with CBC and CMP for repeat evaluation. He understands and agrees with this plan of care. I have answered his questions and he knows to call with any concerns.  I provided 20 minutes of face-to-face time during this this encounter and > 50% was spent counseling as documented under my assessment and plan.    [name], MD [facility] [address] [place] [address] Dept: [phone] Dept Fax: [phone]   CHIEF COMPLAINT:  CC: Newly diagnosed HPV positive squamous cell carcinoma of the head and neck  Current Treatment:  Concurrent chemoradiation with weekly cisplatin   HISTORY OF PRESENT ILLNESS:  David Kelly is a 75 y.o. 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 This began in July when the patient was able to palpate swelling of the right side of his neck. This area was not painful and he denied any dysphagia. He followed up with Dr. Melida Quitter of otolaryngology on July 28th who performed FNA. Pathology from this procedure revealed rare atypical cells of undermined significance. Repeat FNA was performed on August 16th and pathology revealed acute and chronic inflammation, with no malignant cell identified. Eventually, right neck lymph node biopsy was obtained on September 6th and final pathology confirmed metastatic squamous cell carcinoma associated with scanty lymphoid  tissue.  Laryngoscopy performed on September 8th revealed a mass involving the right lateral pharyngeal wall inferior to the tonsillar region and extending to the midline of the vallecular.  NM PET was performed on September 28th, revealing increased radiotracer uptake in the patient's right hypopharynx. A  solitary hypermetabolic metastatic lymph node in the right level 2 nodal region was also observed, measuring 3.2 cm. No distant metastatic disease was seen. He was seen in consultation by Dr. Isidore Moos and Dr. Alvy Bimler and concurrent chemoradiation with weekly cisplatin was recommended. As the patient lives locally, he would like treatment to be carried out at our facility. He uses a cane to ambulate, and had a knee replacement 10 years ago.   INTERVAL HISTORY:  I have reviewed his chart and materials related to his cancer extensively and collaborated history with the patient. Summary of oncologic history is as follows: Oncology History Overview Note  Immunohistochemistry for p16 is strongly positive.    Pharyngeal carcinoma, squamous cell ([place])  09/10/2020 Initial Diagnosis   He presented with a palpable knot in his upper right neck to Dr. Redmond Baseman on 09/10/20. Patient denied any pain at that time, though did report some difficulty swallowing.     10/20/2020 Pathology Results   FINAL MICROSCOPIC DIAGNOSIS:   A. LYMPH NODE, RIGHT NECK, NEEDLE CORE BIOPSY:  - Metastatic squamous cell carcinoma associated with scanty lymphoid tissue.   10/20/2020 Procedure   Technically successful ultrasound guided biopsy of indeterminate right neck mass/nodal conglomeration.   10/22/2020 Procedure   The fiberoptic laryngoscope was then placed through the nasal passage to view the pharynx and larynx. After completion, the telescope was removed. Findings included normal nasal passages and no mass or abnormality in the nasopharynx. There is a granular mass involving the right lateral pharyngeal wall inferior to tonsillar area and extending to the midline of the vallecula. The larynx appears normal. Pyriform sinuses are open. Secretions are minimal. Vocal folds are without mass, scarring, or ulceration. The vocal folds adduct and abduct symmetrically. There is good glottal closure. Muscle tension patterns are not present.  Laryngeal edema is minimal.    11/11/2020 PET scan   1. Hypermetabolic mass in the RIGHT hypopharynx localizing to the RIGHT tonsillar pillar and vallecula. 2. Solitary hypermetabolic metastatic lymph node to the RIGHT level II nodal station. 3. No distant metastatic disease.   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[place])   11/16/2020 Cancer Staging   Staging form: Pharynx - HPV-Mediated Oropharynx, AJCC 8th Edition - Clinical stage from 11/16/2020: Stage I (cT2, cN1, cM0, p16+) - Signed by Eppie Gibson, MD on 11/16/2020 Stage prefix: Initial diagnosis    12/22/2020 -  Chemotherapy   Patient is on Treatment Plan : HEAD/NECK Cisplatin q7d      Reece is here for routine follow up prior to a 6th cycle of weekly cisplatin. He is now having more dysphagia and can't even swallow his saliva. He states that he is currently living in a motel. He continues to have chronic pain of the right knee. Otherwise, he denies complaints. White count has decreased from 5.1 to 3.4 with an ANC of 2480, hemoglobin has improved from 11.1 to 11.7, platelets are unremarkable. Chemistries are unremarkable except for a BUN of 25. He knows to include free water in addition to his other liquids. His appetite is fair but he can only intake liquids, and he has lost 4 pounds since his last visit. He does supplement with Glucerna. He denies fever, chills  or other signs of infection.  He denies nausea, vomiting, bowel issues, or abdominal pain.  He denies sore throat, cough, dyspnea, or chest pain.  HISTORY:   Allergies: No Known Allergies  Current Medications: Current Outpatient Medications  Medication Sig Dispense Refill   Blood Glucose Monitoring Suppl (ACCU-CHEK GUIDE ME) w/Device KIT Use as directed twice per day E11.9 1 kit 0   brimonidine-timolol (COMBIGAN) 0.2-0.5 % ophthalmic solution Place 1 drop into both eyes every 12 (twelve) hours. 15 mL 1   diclofenac sodium (VOLTAREN) 1 % GEL  Apply 1 application topically 3 (three) times daily. Right knee and both shoulders. 3 Tube 4   ferrous sulfate 325 (65 FE) MG tablet Take 325 mg by mouth daily with breakfast. Informed patient to start taking iron supplement every day. 01/11/21     fluconazole (DIFLUCAN) 100 MG tablet Take 100 mg by mouth daily. Take one tablet daily for 10 days.     Fluocinolone Acetonide 0.01 % OIL Place 5 drops in ear(s) 2 (two) times daily. 20 mL 1   fluticasone (FLONASE) 50 MCG/ACT nasal spray Place 2 sprays into both nostrils daily. 16 g 6   glucose blood (ACCU-CHEK GUIDE) test strip Use as instructed twice per day E11.9 200 each 12   Lancets MISC Use as directed twice per day E11.9 200 each 12   levocetirizine (XYZAL) 5 MG tablet      magic mouthwash (nystatin, lidocaine, diphenhydrAMINE, alum & mag hydroxide) suspension Swish and swallow 5 mLs 4 (four) times daily as needed for mouth pain. Swish and swallow 80m QID prn pain.     metFORMIN (GLUCOPHAGE) 1000 MG tablet TAKE 1 TABLET BY MOUTH 2 TIMES DAILY WITH A MEAL 180 tablet 3   montelukast (SINGULAIR) 10 MG tablet Take 1 tablet (10 mg total) by mouth at bedtime. 90 tablet 3   Multiple Vitamin (MULTIVITAMIN WITH MINERALS) TABS tablet Take 1 tablet by mouth daily.     naproxen (NAPROSYN) 500 MG tablet Take by mouth.     ondansetron (ZOFRAN) 8 MG tablet Take 1 tablet (8 mg total) by mouth 2 (two) times daily as needed. Start on the third day after cisplatin chemotherapy. 30 tablet 1   prochlorperazine (COMPAZINE) 10 MG tablet Take 1 tablet (10 mg total) by mouth every 6 (six) hours as needed (Nausea or vomiting). 30 tablet 1   psyllium (METAMUCIL) 58.6 % packet Take 1 packet by mouth daily.     tamsulosin (FLOMAX) 0.4 MG CAPS capsule Take 1 capsule (0.4 mg total) by mouth at bedtime. 90 capsule 3   triamcinolone ointment (KENALOG) 0.5 % APPLY TO AFFECTED AREA(S) TWICE DAILY 30 g 2   No current facility-administered medications for this visit.    REVIEW OF  SYSTEMS:  Review of Systems  Constitutional:  Positive for unexpected weight change (weight loss). Negative for appetite change, chills, fatigue and fever.  HENT:   Positive for trouble swallowing (dysphagia).   Eyes: Negative.   Respiratory: Negative.  Negative for chest tightness, cough, hemoptysis, shortness of breath and wheezing.   Cardiovascular: Negative.  Negative for chest pain, leg swelling and palpitations.  Gastrointestinal: Negative.  Negative for abdominal distention, abdominal pain, blood in stool, constipation, diarrhea, nausea and vomiting.  Endocrine: Negative.   Genitourinary: Negative.  Negative for difficulty urinating, dysuria, frequency and hematuria.   Musculoskeletal:  Positive for arthralgias (of the right knee, chronic). Negative for back pain, flank pain, gait problem and myalgias.  Skin: Negative.   Neurological: Negative.  Negative for dizziness, extremity weakness, gait problem, headaches, light-headedness, numbness, seizures and speech difficulty.  Hematological: Negative.   Psychiatric/Behavioral: Negative.  Negative for depression and sleep disturbance. The patient is not nervous/anxious.   All other systems reviewed and are negative.   VITALS:  Blood pressure 113/72, pulse (!) 104, temperature 98.4 F (36.9 C), temperature source Oral, resp. rate 18, height 6' (1.829 m), weight 180 lb 1.6 oz (81.7 kg), SpO2 99 %.  Wt Readings from Last 3 Encounters:  01/22/21 180 lb 1.6 oz (81.7 kg)  01/18/21 184 lb 1.9 oz (83.5 kg)  01/11/21 187 lb 4.8 oz (85 kg)    Body mass index is 24.43 kg/m.  Performance status (ECOG): 1 - Symptomatic but completely ambulatory  PHYSICAL EXAM:  Physical Exam Constitutional:      General: He is not in acute distress.    Appearance: Normal appearance. He is normal weight.  HENT:     Head: Normocephalic and atraumatic.  Eyes:     General: No scleral icterus.    Extraocular Movements: Extraocular movements intact.      Conjunctiva/sclera: Conjunctivae normal.     Pupils: Pupils are equal, round, and reactive to light.  Neck:     Comments: He has erythematous changes of bilateral neck.  Cardiovascular:     Rate and Rhythm: Normal rate and regular rhythm.     Pulses: Normal pulses.     Heart sounds: Normal heart sounds. No murmur heard.   No friction rub. No gallop.  Pulmonary:     Effort: Pulmonary effort is normal. No respiratory distress.     Breath sounds: Normal breath sounds.  Abdominal:     General: Bowel sounds are normal. There is no distension.     Palpations: Abdomen is soft. There is no hepatomegaly, splenomegaly or mass.     Tenderness: There is no abdominal tenderness.     Comments: Feeding tube in place in the mid epigastrium.   Musculoskeletal:        General: Normal range of motion.     Cervical back: Normal range of motion and neck supple.     Right lower leg: No edema.     Left lower leg: No edema.  Lymphadenopathy:     Cervical: No cervical adenopathy.  Skin:    General: Skin is warm and dry.  Neurological:     General: No focal deficit present.     Mental Status: He is alert and oriented to person, place, and time. Mental status is at baseline.  Psychiatric:        Mood and Affect: Mood normal.        Behavior: Behavior normal.        Thought Content: Thought content normal.        Judgment: Judgment normal.    LABS:   CBC Latest Ref Rng & Units 01/22/2021 01/15/2021 01/11/2021  WBC - 3.4 5.1 6.8  Hemoglobin 13.5 - 17.5 11.7(A) 11.1(A) 11.4(A)  Hematocrit 41 - 53 35(A) 35(A) 35(A)  Platelets 150 - 399 276 305 347   CMP Latest Ref Rng & Units 01/22/2021 01/15/2021 01/11/2021  Glucose 70 - 99 mg/dL - - -  BUN 4 - 21 25(A) 21 12  Creatinine 0.6 - 1.3 0.8 0.8 0.8  Sodium 137 - 147 137 138 137  Potassium 3.4 - 5.3 4.1 4.0 4.1  Chloride 99 - 108 99 103 102  CO2 13 - 22 26(A) 24(A) 24(A)  Calcium 8.7 - 10.7 9.6 9.0  9.3  Total Protein 6.0 - 8.3 g/dL - - -  Total  Bilirubin 0.2 - 1.2 mg/dL - - -  Alkaline Phos 25 - 125 55 58 66  AST 14 - 40 27 26 29   ALT 10 - 40 25 21 22     Lab Results  Component Value Date   TIBC 462 (H) 12/03/2020   FERRITIN 60 12/03/2020   IRONPCTSAT 16 (L) 12/03/2020   IRONPCTSAT 22.8 03/11/2019   IRONPCTSAT 46.6 06/28/2016   No results found for: L[facility]  STUDIES:  No results found.     I, Rita Ohara, am acting as scribe for [name], MD  I have reviewed this report as typed by the medical scribe, and it is complete and accurate.

## 2021-01-22 NOTE — Telephone Encounter (Signed)
Per 12/9 LOS, added Follow Up to Already Scheduled 12/16 Lab Appt.  Requested patient be given Updated Appt Calendar at Next Visit

## 2021-01-25 ENCOUNTER — Other Ambulatory Visit: Payer: Self-pay

## 2021-01-25 ENCOUNTER — Inpatient Hospital Stay: Payer: Medicare Other

## 2021-01-25 ENCOUNTER — Other Ambulatory Visit: Payer: Self-pay | Admitting: Hematology and Oncology

## 2021-01-25 VITALS — BP 124/69 | HR 114 | Temp 98.4°F | Resp 18 | Ht 72.0 in | Wt 180.1 lb

## 2021-01-25 DIAGNOSIS — C140 Malignant neoplasm of pharynx, unspecified: Secondary | ICD-10-CM

## 2021-01-25 DIAGNOSIS — Z5111 Encounter for antineoplastic chemotherapy: Secondary | ICD-10-CM | POA: Diagnosis not present

## 2021-01-25 MED ORDER — SODIUM CHLORIDE 0.9 % IV SOLN
39.5000 mg/m2 | Freq: Once | INTRAVENOUS | Status: AC
  Administered 2021-01-25: 85 mg via INTRAVENOUS
  Filled 2021-01-25: qty 85

## 2021-01-25 MED ORDER — HEPARIN SOD (PORK) LOCK FLUSH 100 UNIT/ML IV SOLN
500.0000 [IU] | Freq: Once | INTRAVENOUS | Status: AC | PRN
  Administered 2021-01-25: 500 [IU]

## 2021-01-25 MED ORDER — SODIUM CHLORIDE 0.9 % IV SOLN
10.0000 mg | Freq: Once | INTRAVENOUS | Status: AC
  Administered 2021-01-25: 10 mg via INTRAVENOUS
  Filled 2021-01-25: qty 10

## 2021-01-25 MED ORDER — SODIUM CHLORIDE 0.9% FLUSH
10.0000 mL | INTRAVENOUS | Status: DC | PRN
  Administered 2021-01-25: 10 mL

## 2021-01-25 MED ORDER — PALONOSETRON HCL INJECTION 0.25 MG/5ML
0.2500 mg | Freq: Once | INTRAVENOUS | Status: AC
  Administered 2021-01-25: 0.25 mg via INTRAVENOUS
  Filled 2021-01-25: qty 5

## 2021-01-25 MED ORDER — SODIUM CHLORIDE 0.9 % IV SOLN
150.0000 mg | Freq: Once | INTRAVENOUS | Status: AC
  Administered 2021-01-25: 150 mg via INTRAVENOUS
  Filled 2021-01-25: qty 5

## 2021-01-25 MED ORDER — MAGNESIUM SULFATE 2 GM/50ML IV SOLN
2.0000 g | Freq: Once | INTRAVENOUS | Status: AC
  Administered 2021-01-25: 2 g via INTRAVENOUS
  Filled 2021-01-25: qty 50

## 2021-01-25 MED ORDER — POTASSIUM CHLORIDE IN NACL 20-0.9 MEQ/L-% IV SOLN
Freq: Once | INTRAVENOUS | Status: AC
  Filled 2021-01-25: qty 1000

## 2021-01-25 MED ORDER — SODIUM CHLORIDE 0.9 % IV SOLN
Freq: Once | INTRAVENOUS | Status: AC
Start: 2021-01-25 — End: 2021-01-25

## 2021-01-25 NOTE — Patient Instructions (Signed)
David Kelly  Discharge Instructions: Thank you for choosing [facility] to provide your oncology and hematology care.  If you have a lab appointment with the [facility], please go directly to the [facility] and check in at the registration area.   Wear comfortable clothing and clothing appropriate for easy access to any Portacath or PICC line.   We strive to give you quality time with your provider. You may need to reschedule your appointment if you arrive late (15 or more minutes).  Arriving late affects you and other patients whose appointments are after yours.  Also, if you miss three or more appointments without notifying the office, you may be dismissed from the clinic at the provider's discretion.      For prescription refill requests, have your pharmacy contact our office and allow 72 hours for refills to be completed.    Today you received the following chemotherapy and/or immunotherapy agents Cisplatin      To help prevent nausea and vomiting after your treatment, we encourage you to take your nausea medication as directed.  BELOW ARE SYMPTOMS THAT SHOULD BE REPORTED IMMEDIATELY: *FEVER GREATER THAN 100.4 F (38 C) OR HIGHER *CHILLS OR SWEATING *NAUSEA AND VOMITING THAT IS NOT CONTROLLED WITH YOUR NAUSEA MEDICATION *UNUSUAL SHORTNESS OF BREATH *UNUSUAL BRUISING OR BLEEDING *URINARY PROBLEMS (pain or burning when urinating, or frequent urination) *BOWEL PROBLEMS (unusual diarrhea, constipation, pain near the anus) TENDERNESS IN MOUTH AND THROAT WITH OR WITHOUT PRESENCE OF ULCERS (sore throat, sores in mouth, or a toothache) UNUSUAL RASH, SWELLING OR PAIN  UNUSUAL VAGINAL DISCHARGE OR ITCHING   Items with * indicate a potential emergency and should be followed up as soon as possible or go to the Emergency Department if any problems should occur.  Please show the CHEMOTHERAPY ALERT CARD or IMMUNOTHERAPY ALERT CARD at check-in to the  Emergency Department and triage nurse.  Should you have questions after your visit or need to cancel or reschedule your appointment, please contact [facility]  Dept: [phone]  and follow the prompts.  Office hours are 8:00 a.m. to 4:30 p.m. Monday - Friday. Please note that voicemails left after 4:00 p.m. may not be returned until the following business day.  We are closed weekends and major holidays. You have access to a nurse at all times for urgent questions. Please call the main number to the clinic Dept: [phone] and follow the prompts.  For any non-urgent questions, you may also contact your provider using MyChart. We now offer e-Visits for anyone 74 and older to request care online for non-urgent symptoms. For details visit [email or web address].   Also download the MyChart app! Go to the app store, search "MyChart", open the app, select [facility], and log in with your MyChart username and password.  Due to Covid, a mask is required upon entering the hospital/clinic. If you do not have a mask, one will be given to you upon arrival. For doctor visits, patients may have 1 support person aged 61 or older with them. For treatment visits, patients cannot have anyone with them due to current Covid guidelines and our immunocompromised population.

## 2021-01-28 ENCOUNTER — Encounter: Payer: Self-pay | Admitting: Oncology

## 2021-01-28 ENCOUNTER — Ambulatory Visit: Payer: Medicare Other | Admitting: Internal Medicine

## 2021-01-29 ENCOUNTER — Inpatient Hospital Stay (INDEPENDENT_AMBULATORY_CARE_PROVIDER_SITE_OTHER): Payer: Medicare Other | Admitting: Hematology and Oncology

## 2021-01-29 ENCOUNTER — Encounter: Payer: Self-pay | Admitting: Oncology

## 2021-01-29 ENCOUNTER — Other Ambulatory Visit: Payer: Self-pay | Admitting: Hematology and Oncology

## 2021-01-29 ENCOUNTER — Encounter: Payer: Self-pay | Admitting: Hematology and Oncology

## 2021-01-29 ENCOUNTER — Inpatient Hospital Stay: Payer: Medicare Other

## 2021-01-29 DIAGNOSIS — C140 Malignant neoplasm of pharynx, unspecified: Secondary | ICD-10-CM

## 2021-01-29 LAB — COMPREHENSIVE METABOLIC PANEL
Albumin: 4.3 (ref 3.5–5.0)
Calcium: 9.1 (ref 8.7–10.7)

## 2021-01-29 LAB — BASIC METABOLIC PANEL
BUN: 25 — AB (ref 4–21)
CO2: 24 — AB (ref 13–22)
Chloride: 100 (ref 99–108)
Creatinine: 0.8 (ref 0.6–1.3)
Glucose: 174
Potassium: 3.7 (ref 3.4–5.3)
Sodium: 136 — AB (ref 137–147)

## 2021-01-29 LAB — CBC AND DIFFERENTIAL
HCT: 32 — AB (ref 41–53)
Hemoglobin: 10.5 — AB (ref 13.5–17.5)
Neutrophils Absolute: 2.57
Platelets: 253 (ref 150–399)
WBC: 3.3

## 2021-01-29 LAB — CBC
Absolute Lymphocytes: 0.26 — AB (ref 0.65–4.75)
MCV: 81 (ref 80–94)
RBC: 3.95 (ref 3.87–5.11)

## 2021-01-29 LAB — HEPATIC FUNCTION PANEL
ALT: 21 (ref 10–40)
AST: 25 (ref 14–40)
Alkaline Phosphatase: 55 (ref 25–125)
Bilirubin, Total: 0.4

## 2021-01-29 MED ORDER — TRAMADOL HCL 50 MG PO TABS: 50.0000 mg | ORAL_TABLET | Freq: Four times a day (QID) | ORAL | 0 refills | Status: DC | PRN

## 2021-01-29 MED FILL — Fosaprepitant Dimeglumine For IV Infusion 150 MG (Base Eq): INTRAVENOUS | Qty: 5 | Status: AC

## 2021-01-29 MED FILL — Cisplatin Inj 100 MG/100ML (1 MG/ML): INTRAVENOUS | Qty: 85 | Status: AC

## 2021-01-29 NOTE — Progress Notes (Signed)
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  He has a mass involving the right lateral pharyngeal wall inferior to the tonsillar region and extending to the midline of the vallecular. He is receiving concurrent chemoradiation with weekly cisplatin. He is tolerating this fairly well, but he has issues with malnutrition and weight loss. He has multiple social issues including inconsistent housing. We are assisting him with application for Section 8 housing. We are also providing him with samples of formula for nutrition. He is using his feeding tube regularly. He will return to clinic in one week for evaluation prior to cycle 8.   The patient understands the plans discussed today and is in agreement with them.  He knows to contact our office if he develops concerns prior to his next appointment.    Melodye Ped, NP  [facility] [address] [place] [address] Dept: [phone] Dept Fax: [phone]   No orders of the defined types were placed in this encounter.     CHIEF COMPLAINT:  CC: A 75 year old male with history of head/neck cancer here for evaluation prior to cycle 7 chemotherapy  Current Treatment:  Weekly cisplatin with concurrent radiation  INTERVAL HISTORY:  David Kelly is here today  for repeat clinical assessment. He denies fevers or chills. He denies pain. His appetite is good. His weight has been stable.  I have reviewed the past medical history, past surgical history, social history and family history with the patient and they are unchanged from previous note.  ALLERGIES:  has No Known Allergies.  MEDICATIONS:  Current Outpatient Medications  Medication Sig Dispense Refill   traMADol (ULTRAM) 50 MG tablet Take 1 tablet (50 mg total) by mouth every 6 (six) hours as needed. 90 tablet 0   Blood Glucose Monitoring Suppl (ACCU-CHEK GUIDE ME) w/Device KIT Use as directed twice per day E11.9 1 kit 0   brimonidine-timolol (COMBIGAN) 0.2-0.5 % ophthalmic solution Place 1 drop into both eyes every 12 (twelve) hours. 15 mL 1   diclofenac sodium (VOLTAREN) 1 % GEL Apply 1 application topically 3 (three) times daily. Right knee and both shoulders. 3 Tube 4   ferrous sulfate 325 (65 FE) MG tablet Take 325 mg by mouth daily with breakfast. Informed patient to start taking iron supplement every day. 01/11/21     fluconazole (DIFLUCAN) 100 MG tablet Take 100 mg by mouth daily. Take one tablet daily for 10 days.     Fluocinolone Acetonide 0.01 % OIL Place 5 drops in ear(s) 2 (two) times daily. 20 mL 1   fluticasone (FLONASE) 50 MCG/ACT nasal spray Place 2 sprays into both nostrils daily. 16 g 6   glucose blood (ACCU-CHEK GUIDE) test strip Use as instructed twice per day E11.9 200 each 12   Lancets MISC Use as directed twice per day E11.9 200 each 12   levocetirizine (XYZAL) 5 MG tablet  magic mouthwash (nystatin, lidocaine, diphenhydrAMINE, alum & mag hydroxide) suspension Swish and swallow 5 mLs 4 (four) times daily as needed for mouth pain. Swish and swallow 58m QID prn pain.     metFORMIN (GLUCOPHAGE) 1000 MG tablet TAKE 1 TABLET BY MOUTH 2 TIMES DAILY WITH A MEAL 180 tablet 3   montelukast (SINGULAIR) 10 MG tablet Take 1 tablet (10 mg total) by mouth at bedtime. 90 tablet 3    Multiple Vitamin (MULTIVITAMIN WITH MINERALS) TABS tablet Take 1 tablet by mouth daily.     naproxen (NAPROSYN) 500 MG tablet Take by mouth.     ondansetron (ZOFRAN) 8 MG tablet Take 1 tablet (8 mg total) by mouth 2 (two) times daily as needed. Start on the third day after cisplatin chemotherapy. 30 tablet 1   prochlorperazine (COMPAZINE) 10 MG tablet Take 1 tablet (10 mg total) by mouth every 6 (six) hours as needed (Nausea or vomiting). 30 tablet 1   psyllium (METAMUCIL) 58.6 % packet Take 1 packet by mouth daily.     tamsulosin (FLOMAX) 0.4 MG CAPS capsule Take 1 capsule (0.4 mg total) by mouth at bedtime. 90 capsule 3   triamcinolone ointment (KENALOG) 0.5 % APPLY TO AFFECTED AREA(S) TWICE DAILY 30 g 2   No current facility-administered medications for this visit.    HISTORY OF PRESENT ILLNESS:   Oncology History Overview Note  Immunohistochemistry for p16 is strongly positive.    Pharyngeal carcinoma, squamous cell ([place]  09/10/2020 Initial Diagnosis   He presented with a palpable knot in his upper right neck to Dr. BRedmond Basemanon 09/10/20. Patient denied any pain at that time, though did report some difficulty swallowing.     10/20/2020 Pathology Results   FINAL MICROSCOPIC DIAGNOSIS:   A. LYMPH NODE, RIGHT NECK, NEEDLE CORE BIOPSY:  - Metastatic squamous cell carcinoma associated with scanty lymphoid tissue.   10/20/2020 Procedure   Technically successful ultrasound guided biopsy of indeterminate right neck mass/nodal conglomeration.   10/22/2020 Procedure   The fiberoptic laryngoscope was then placed through the nasal passage to view the pharynx and larynx. After completion, the telescope was removed. Findings included normal nasal passages and no mass or abnormality in the nasopharynx. There is a granular mass involving the right lateral pharyngeal wall inferior to tonsillar area and extending to the midline of the vallecula. The larynx appears normal. Pyriform sinuses are open. Secretions  are minimal. Vocal folds are without mass, scarring, or ulceration. The vocal folds adduct and abduct symmetrically. There is good glottal closure. Muscle tension patterns are not present. Laryngeal edema is minimal.    11/11/2020 PET scan   1. Hypermetabolic mass in the RIGHT hypopharynx localizing to the RIGHT tonsillar pillar and vallecula. 2. Solitary hypermetabolic metastatic lymph node to the RIGHT level II nodal station. 3. No distant metastatic disease.   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[place]   11/16/2020 Cancer Staging   Staging form: Pharynx - HPV-Mediated Oropharynx, AJCC 8th Edition - Clinical stage from 11/16/2020: Stage I (cT2, cN1, cM0, p16+) - Signed by SEppie Gibson MD on 11/16/2020 Stage prefix: Initial diagnosis    12/22/2020 -  Chemotherapy   Patient is on Treatment Plan : HEAD/NECK Cisplatin q7d         REVIEW OF SYSTEMS:   Constitutional: Denies fevers, chills or abnormal weight loss Eyes: Denies blurriness of vision Ears, nose, mouth, throat, and face: Denies mucositis or sore throat Respiratory: Denies cough, dyspnea or wheezes Cardiovascular: Denies palpitation, chest  discomfort or lower extremity swelling Gastrointestinal:  Denies nausea, heartburn or change in bowel habits Skin: Denies abnormal skin rashes Lymphatics: Denies new lymphadenopathy or easy bruising Neurological:Denies numbness, tingling or new weaknesses Behavioral/Psych: Mood is stable, no new changes  All other systems were reviewed with the patient and are negative.   VITALS:  Blood pressure (!) 153/84, pulse 88, temperature 98.5 F (36.9 C), temperature source Oral, resp. rate 20, height 6' (1.829 m), weight 177 lb 14.4 oz (80.7 kg), SpO2 98 %.  Wt Readings from Last 3 Encounters:  01/29/21 177 lb 14.4 oz (80.7 kg)  01/25/21 180 lb 1.5 oz (81.7 kg)  01/22/21 180 lb 1.6 oz (81.7 kg)    Body mass index is 24.13 kg/m.  Performance status (ECOG): 1 -  Symptomatic but completely ambulatory  PHYSICAL EXAM:   GENERAL:alert, no distress and comfortable SKIN: skin color, texture, turgor are normal, no rashes or significant lesions EYES: normal, Conjunctiva are pink and non-injected, sclera clear OROPHARYNX:no exudate, no erythema and lips, buccal mucosa, and tongue normal  NECK: supple, thyroid normal size, non-tender, without nodularity LYMPH:  no palpable lymphadenopathy in the cervical, axillary or inguinal LUNGS: clear to auscultation and percussion with normal breathing effort HEART: regular rate & rhythm and no murmurs and no lower extremity edema ABDOMEN:abdomen soft, non-tender and normal bowel sounds Musculoskeletal:no cyanosis of digits and no clubbing  NEURO: alert & oriented x 3 with fluent speech, no focal motor/sensory deficits  LABORATORY DATA:  I have reviewed the data as listed    Component Value Date/Time   NA 136 (A) 01/29/2021 0000   K 3.7 01/29/2021 0000   CL 100 01/29/2021 0000   CO2 24 (A) 01/29/2021 0000   GLUCOSE 164 (H) 11/20/2020 0800   BUN 25 (A) 01/29/2021 0000   CREATININE 0.8 01/29/2021 0000   CREATININE 0.76 11/20/2020 0800   CALCIUM 9.1 01/29/2021 0000   PROT 7.5 04/29/2020 1501   ALBUMIN 4.3 01/29/2021 0000   AST 25 01/29/2021 0000   ALT 21 01/29/2021 0000   ALKPHOS 55 01/29/2021 0000   BILITOT 0.4 04/29/2020 1501   GFRNONAA >60 11/20/2020 0800   GFRAA >60 05/17/2016 2106    No results found for: SPEP, UPEP  Lab Results  Component Value Date   WBC 3.3 01/29/2021   NEUTROABS 2.57 01/29/2021   HGB 10.5 (A) 01/29/2021   HCT 32 (A) 01/29/2021   MCV 81 01/29/2021   PLT 253 01/29/2021      Chemistry      Component Value Date/Time   NA 136 (A) 01/29/2021 0000   K 3.7 01/29/2021 0000   CL 100 01/29/2021 0000   CO2 24 (A) 01/29/2021 0000   BUN 25 (A) 01/29/2021 0000   CREATININE 0.8 01/29/2021 0000   CREATININE 0.76 11/20/2020 0800   GLU 174 01/29/2021 0000      Component Value  Date/Time   CALCIUM 9.1 01/29/2021 0000   ALKPHOS 55 01/29/2021 0000   AST 25 01/29/2021 0000   ALT 21 01/29/2021 0000   BILITOT 0.4 04/29/2020 1501       RADIOGRAPHIC STUDIES: I have personally reviewed the radiological images as listed and agreed with the findings in the report. No results found.

## 2021-01-29 NOTE — Assessment & Plan Note (Signed)
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  He has a mass involving the right lateral pharyngeal wall inferior to the tonsillar region and extending to the midline of the vallecular. He is receiving concurrent chemoradiation with weekly cisplatin. He is tolerating this fairly well, but he has issues with malnutrition and weight loss. He has multiple social issues including inconsistent housing. We are assisting him with application for Section 8 housing. We are also providing him with samples of formula for nutrition. He is using his feeding tube regularly. He will return to clinic in one week for evaluation prior to cycle 8.

## 2021-01-31 ENCOUNTER — Encounter: Payer: Self-pay | Admitting: Oncology

## 2021-02-01 ENCOUNTER — Other Ambulatory Visit: Payer: Self-pay | Admitting: Hematology and Oncology

## 2021-02-01 ENCOUNTER — Telehealth: Payer: Self-pay | Admitting: Oncology

## 2021-02-01 ENCOUNTER — Other Ambulatory Visit: Payer: Self-pay

## 2021-02-01 ENCOUNTER — Inpatient Hospital Stay: Payer: Medicare Other

## 2021-02-01 VITALS — BP 177/93 | HR 110 | Temp 98.7°F | Resp 20 | Ht 72.0 in | Wt 177.5 lb

## 2021-02-01 DIAGNOSIS — Z5111 Encounter for antineoplastic chemotherapy: Secondary | ICD-10-CM | POA: Diagnosis not present

## 2021-02-01 DIAGNOSIS — C140 Malignant neoplasm of pharynx, unspecified: Secondary | ICD-10-CM

## 2021-02-01 MED ORDER — PALONOSETRON HCL INJECTION 0.25 MG/5ML
0.2500 mg | Freq: Once | INTRAVENOUS | Status: AC
  Administered 2021-02-01: 0.25 mg via INTRAVENOUS
  Filled 2021-02-01: qty 5

## 2021-02-01 MED ORDER — SODIUM CHLORIDE 0.9 % IV SOLN: Freq: Once | INTRAVENOUS | Status: AC

## 2021-02-01 MED ORDER — SODIUM CHLORIDE 0.9 % IV SOLN
39.5000 mg/m2 | Freq: Once | INTRAVENOUS | Status: AC
  Administered 2021-02-01: 85 mg via INTRAVENOUS
  Filled 2021-02-01: qty 85

## 2021-02-01 MED ORDER — SODIUM CHLORIDE 0.9% FLUSH
10.0000 mL | INTRAVENOUS | Status: DC | PRN
  Administered 2021-02-01: 10 mL

## 2021-02-01 MED ORDER — MAGNESIUM SULFATE 2 GM/50ML IV SOLN
2.0000 g | Freq: Once | INTRAVENOUS | Status: AC
  Administered 2021-02-01: 2 g via INTRAVENOUS
  Filled 2021-02-01: qty 50

## 2021-02-01 MED ORDER — SODIUM CHLORIDE 0.9 % IV SOLN
150.0000 mg | Freq: Once | INTRAVENOUS | Status: AC
  Administered 2021-02-01: 150 mg via INTRAVENOUS
  Filled 2021-02-01: qty 150

## 2021-02-01 MED ORDER — POTASSIUM CHLORIDE IN NACL 20-0.9 MEQ/L-% IV SOLN
Freq: Once | INTRAVENOUS | Status: AC
  Filled 2021-02-01: qty 1000

## 2021-02-01 MED ORDER — SODIUM CHLORIDE 0.9 % IV SOLN: INTRAVENOUS | Status: DC

## 2021-02-01 MED ORDER — SODIUM CHLORIDE 0.9 % IV SOLN
10.0000 mg | Freq: Once | INTRAVENOUS | Status: AC
  Administered 2021-02-01: 10 mg via INTRAVENOUS
  Filled 2021-02-01: qty 10

## 2021-02-01 MED ORDER — HEPARIN SOD (PORK) LOCK FLUSH 100 UNIT/ML IV SOLN: 500.0000 [IU] | Freq: Once | INTRAVENOUS | Status: DC | PRN

## 2021-02-01 NOTE — Progress Notes (Signed)
1536:PT STABLE AT TIME OF DISCHARGE

## 2021-02-01 NOTE — Patient Instructions (Signed)
[facility]  Discharge Instructions: Thank you for choosing [facility] to provide your oncology and hematology care.  If you have a lab appointment with the [facility], please go directly to the [facility] and check in at the registration area.   Wear comfortable clothing and clothing appropriate for easy access to any Portacath or PICC line.   We strive to give you quality time with your provider. You may need to reschedule your appointment if you arrive late (15 or more minutes).  Arriving late affects you and other patients whose appointments are after yours.  Also, if you miss three or more appointments without notifying the office, you may be dismissed from the clinic at the providers discretion.      For prescription refill requests, have your pharmacy contact our office and allow 72 hours for refills to be completed.    Today you received the following chemotherapy and/or immunotherapy agents Cisplatin    To help prevent nausea and vomiting after your treatment, we encourage you to take your nausea medication as directed.  BELOW ARE SYMPTOMS THAT SHOULD BE REPORTED IMMEDIATELY: *FEVER GREATER THAN 100.4 F (38 C) OR HIGHER *CHILLS OR SWEATING *NAUSEA AND VOMITING THAT IS NOT CONTROLLED WITH YOUR NAUSEA MEDICATION *UNUSUAL SHORTNESS OF BREATH *UNUSUAL BRUISING OR BLEEDING *URINARY PROBLEMS (pain or burning when urinating, or frequent urination) *BOWEL PROBLEMS (unusual diarrhea, constipation, pain near the anus) TENDERNESS IN MOUTH AND THROAT WITH OR WITHOUT PRESENCE OF ULCERS (sore throat, sores in mouth, or a toothache) UNUSUAL RASH, SWELLING OR PAIN  UNUSUAL VAGINAL DISCHARGE OR ITCHING   Items with * indicate a potential emergency and should be followed up as soon as possible or go to the Emergency Department if any problems should occur.  Please show the CHEMOTHERAPY ALERT CARD or IMMUNOTHERAPY ALERT CARD at check-in to the  Emergency Department and triage nurse.  Should you have questions after your visit or need to cancel or reschedule your appointment, please contact [facility]  Dept: [phone]  and follow the prompts.  Office hours are 8:00 a.m. to 4:30 p.m. Monday - Friday. Please note that voicemails left after 4:00 p.m. may not be returned until the following business day.  We are closed weekends and major holidays. You have access to a nurse at all times for urgent questions. Please call the main number to the clinic Dept: [phone] and follow the prompts.  For any non-urgent questions, you may also contact your provider using MyChart. We now offer e-Visits for anyone 90 and older to request care online for non-urgent symptoms. For details visit [email or web address].   Also download the MyChart app! Go to the app store, search "MyChart", open the app, select [facility], and log in with your MyChart username and password.  Due to Covid, a mask is required upon entering the hospital/clinic. If you do not have a mask, one will be given to you upon arrival. For doctor visits, patients may have 1 support person aged 14 or older with them. For treatment visits, patients cannot have anyone with them due to current Covid guidelines and our immunocompromised population.

## 2021-02-01 NOTE — Telephone Encounter (Signed)
Per 12/19, patient scheduled for 12/28 Labs, Follow Up - 12/30 Infusion.  Will patient be receiving treatments after 12/30?  He will need more Infusions entered into the Plan of Care

## 2021-02-02 ENCOUNTER — Encounter: Payer: Self-pay | Admitting: Oncology

## 2021-02-03 ENCOUNTER — Ambulatory Visit: Payer: Medicare Other | Admitting: Internal Medicine

## 2021-02-03 ENCOUNTER — Other Ambulatory Visit: Payer: Self-pay | Admitting: Hematology and Oncology

## 2021-02-03 MED ORDER — TRAMADOL HCL 50 MG PO TABS: 50.0000 mg | ORAL_TABLET | Freq: Four times a day (QID) | ORAL | 0 refills | Status: DC | PRN

## 2021-02-04 ENCOUNTER — Other Ambulatory Visit: Payer: Self-pay | Admitting: Hematology and Oncology

## 2021-02-04 ENCOUNTER — Telehealth: Payer: Self-pay | Admitting: Dietician

## 2021-02-04 MED ORDER — AZITHROMYCIN 250 MG PO TABS: ORAL_TABLET | ORAL | 0 refills | Status: DC

## 2021-02-04 NOTE — Telephone Encounter (Signed)
Left message on his voice mail to return call to my cell [phone].

## 2021-02-05 ENCOUNTER — Encounter: Payer: Self-pay | Admitting: Hematology and Oncology

## 2021-02-05 ENCOUNTER — Inpatient Hospital Stay (INDEPENDENT_AMBULATORY_CARE_PROVIDER_SITE_OTHER): Payer: Medicare Other | Admitting: Hematology and Oncology

## 2021-02-05 VITALS — BP 126/60 | HR 89 | Temp 99.0°F | Resp 18

## 2021-02-05 DIAGNOSIS — Z5111 Encounter for antineoplastic chemotherapy: Secondary | ICD-10-CM | POA: Diagnosis not present

## 2021-02-05 DIAGNOSIS — C140 Malignant neoplasm of pharynx, unspecified: Secondary | ICD-10-CM | POA: Diagnosis not present

## 2021-02-05 DIAGNOSIS — G8929 Other chronic pain: Secondary | ICD-10-CM | POA: Diagnosis not present

## 2021-02-05 MED ORDER — HEPARIN SOD (PORK) LOCK FLUSH 100 UNIT/ML IV SOLN
500.0000 [IU] | Freq: Once | INTRAVENOUS | Status: AC | PRN
  Administered 2021-02-05: 500 [IU]

## 2021-02-05 MED ORDER — SODIUM CHLORIDE 0.9 % IV SOLN: Freq: Once | INTRAVENOUS | Status: AC

## 2021-02-05 MED ORDER — SODIUM CHLORIDE 0.9% FLUSH
10.0000 mL | Freq: Once | INTRAVENOUS | Status: AC | PRN
  Administered 2021-02-05: 10 mL

## 2021-02-05 NOTE — Assessment & Plan Note (Signed)
He continues to have throat pain and is using Tramadol with relief.

## 2021-02-05 NOTE — Assessment & Plan Note (Signed)
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  He has a mass involving the right lateral pharyngeal wall inferior to the tonsillar region and extending to the midline of the vallecular. He is receiving concurrent chemoradiation with weekly cisplatin. He is tolerating this fairly well, but he has issues with malnutrition and weight loss. He has multiple social issues including inconsistent housing. We are assisting him with application for Section 8 housing. We are also providing him with samples of formula for nutrition. He is using his feeding tube regularly.Today we will give 1 liter of IVF for hydration.

## 2021-02-05 NOTE — Progress Notes (Signed)
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  He has a mass involving the right lateral pharyngeal wall inferior to the tonsillar region and extending to the midline of the vallecular. He is receiving concurrent chemoradiation with weekly cisplatin. He is tolerating this fairly well, but he has issues with malnutrition and weight loss. He has multiple social issues including inconsistent housing. We are assisting him with application for Section 8 housing. We are also providing him with samples of formula for nutrition. He is using his feeding tube regularly.Today we will give 1 liter of IVF for hydration.   Chronic pain He continues to have throat pain and is using Tramadol with relief.    The patient understands the plans discussed today and is in agreement with them.  He knows to contact our office if he develops concerns prior to his next appointment.    Melodye Ped, NP  [facility] [address] [place] [address] Dept: [phone] Dept Fax: [phone]   No orders of the defined types were placed in this encounter.     CHIEF COMPLAINT:  CC: A 75 year old male with history of head/ neck cancer here for symptom management; dehydration  Current Treatment:  Weekly cisplatin with  concurrent radiation  INTERVAL HISTORY:  David Kelly is here today for repeat clinical assessment. He denies fevers or chills. He denies pain. His appetite is good. His weight has been stable.  I have reviewed the past medical history, past surgical history, social history and family history with the patient and they are unchanged from previous note.  ALLERGIES:  has No Known Allergies.  MEDICATIONS:  Current Outpatient Medications  Medication Sig Dispense Refill   azithromycin (ZITHROMAX Z-PAK) 250 MG tablet Take as directed on packet 6 each 0   Blood Glucose Monitoring Suppl (ACCU-CHEK GUIDE ME) w/Device KIT Use as directed twice per day E11.9 1 kit 0   brimonidine-timolol (COMBIGAN) 0.2-0.5 % ophthalmic solution Place 1 drop into both eyes every 12 (twelve) hours. 15 mL 1   diclofenac sodium (VOLTAREN) 1 % GEL Apply 1 application topically 3 (three) times daily. Right knee and both shoulders. 3 Tube 4   ferrous sulfate 325 (65 FE) MG tablet Take 325 mg by mouth daily with breakfast. Informed patient to start taking iron supplement every day. 01/11/21     fluconazole (DIFLUCAN) 100 MG tablet Take 100 mg by mouth daily. Take one tablet daily for 10 days.     Fluocinolone Acetonide 0.01 % OIL Place 5 drops in ear(s) 2 (two) times daily. 20 mL 1   fluticasone (FLONASE) 50 MCG/ACT nasal spray Place 2 sprays into both nostrils daily. 16 g 6   glucose blood (ACCU-CHEK GUIDE) test strip Use as instructed twice per day E11.9 200 each 12   Lancets MISC Use as directed twice per day E11.9 200 each 12   levocetirizine (XYZAL) 5 MG tablet  magic mouthwash (nystatin, lidocaine, diphenhydrAMINE, alum & mag hydroxide) suspension Swish and swallow 5 mLs 4 (four) times daily as needed for mouth pain. Swish and swallow 20m QID prn pain.     metFORMIN (GLUCOPHAGE) 1000 MG tablet TAKE 1 TABLET BY MOUTH 2 TIMES DAILY WITH A MEAL 180 tablet 3   montelukast (SINGULAIR) 10 MG tablet Take 1 tablet (10 mg total)  by mouth at bedtime. 90 tablet 3   Multiple Vitamin (MULTIVITAMIN WITH MINERALS) TABS tablet Take 1 tablet by mouth daily.     naproxen (NAPROSYN) 500 MG tablet Take by mouth.     ondansetron (ZOFRAN) 8 MG tablet Take 1 tablet (8 mg total) by mouth 2 (two) times daily as needed. Start on the third day after cisplatin chemotherapy. 30 tablet 1   prochlorperazine (COMPAZINE) 10 MG tablet Take 1 tablet (10 mg total) by mouth every 6 (six) hours as needed (Nausea or vomiting). 30 tablet 1   psyllium (METAMUCIL) 58.6 % packet Take 1 packet by mouth daily.     tamsulosin (FLOMAX) 0.4 MG CAPS capsule Take 1 capsule (0.4 mg total) by mouth at bedtime. 90 capsule 3   traMADol (ULTRAM) 50 MG tablet Take 1-2 tablets (50-100 mg total) by mouth every 6 (six) hours as needed. 120 tablet 0   triamcinolone ointment (KENALOG) 0.5 % APPLY TO AFFECTED AREA(S) TWICE DAILY 30 g 2   No current facility-administered medications for this visit.    HISTORY OF PRESENT ILLNESS:   Oncology History Overview Note  Immunohistochemistry for p16 is strongly positive.    Pharyngeal carcinoma, squamous cell ([place]  09/10/2020 Initial Diagnosis   He presented with a palpable knot in his upper right neck to Dr. BRedmond Basemanon 09/10/20. Patient denied any pain at that time, though did report some difficulty swallowing.     10/20/2020 Pathology Results   FINAL MICROSCOPIC DIAGNOSIS:   A. LYMPH NODE, RIGHT NECK, NEEDLE CORE BIOPSY:  - Metastatic squamous cell carcinoma associated with scanty lymphoid tissue.   10/20/2020 Procedure   Technically successful ultrasound guided biopsy of indeterminate right neck mass/nodal conglomeration.   10/22/2020 Procedure   The fiberoptic laryngoscope was then placed through the nasal passage to view the pharynx and larynx. After completion, the telescope was removed. Findings included normal nasal passages and no mass or abnormality in the nasopharynx. There is a granular mass involving the right  lateral pharyngeal wall inferior to tonsillar area and extending to the midline of the vallecula. The larynx appears normal. Pyriform sinuses are open. Secretions are minimal. Vocal folds are without mass, scarring, or ulceration. The vocal folds adduct and abduct symmetrically. There is good glottal closure. Muscle tension patterns are not present. Laryngeal edema is minimal.    11/11/2020 PET scan   1. Hypermetabolic mass in the RIGHT hypopharynx localizing to the RIGHT tonsillar pillar and vallecula. 2. Solitary hypermetabolic metastatic lymph node to the RIGHT level II nodal station. 3. No distant metastatic disease.   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[place]   11/16/2020 Cancer Staging   Staging form: Pharynx - HPV-Mediated Oropharynx, AJCC 8th Edition - Clinical stage from 11/16/2020: Stage I (cT2, cN1, cM0, p16+) - Signed by SEppie Gibson MD on 11/16/2020 Stage prefix: Initial diagnosis    12/22/2020 -  Chemotherapy   Patient is on Treatment Plan : HEAD/NECK Cisplatin q7d         REVIEW OF SYSTEMS:   Constitutional: Denies fevers, chills or abnormal weight loss Eyes: Denies  blurriness of vision Ears, nose, mouth, throat, and face: Denies mucositis or sore throat Respiratory: Denies cough, dyspnea or wheezes Cardiovascular: Denies palpitation, chest discomfort or lower extremity swelling Gastrointestinal:  Denies nausea, heartburn or change in bowel habits Skin: Denies abnormal skin rashes Lymphatics: Denies new lymphadenopathy or easy bruising Neurological:Denies numbness, tingling or new weaknesses Behavioral/Psych: Mood is stable, no new changes  All other systems were reviewed with the patient and are negative.   VITALS:  There were no vitals taken for this visit.  Wt Readings from Last 3 Encounters:  02/01/21 177 lb 8 oz (80.5 kg)  01/29/21 177 lb 14.4 oz (80.7 kg)  01/25/21 180 lb 1.5 oz (81.7 kg)    There is no height or weight on  file to calculate BMI.  Performance status (ECOG): 1 - Symptomatic but completely ambulatory  PHYSICAL EXAM:   GENERAL:alert, no distress and comfortable SKIN: skin color, texture, turgor are normal, no rashes or significant lesions EYES: normal, Conjunctiva are pink and non-injected, sclera clear OROPHARYNX:no exudate, no erythema and lips, buccal mucosa, and tongue normal  NECK: supple, thyroid normal size, non-tender, without nodularity LYMPH:  no palpable lymphadenopathy in the cervical, axillary or inguinal LUNGS: clear to auscultation and percussion with normal breathing effort HEART: regular rate & rhythm and no murmurs and no lower extremity edema ABDOMEN:abdomen soft, non-tender and normal bowel sounds Musculoskeletal:no cyanosis of digits and no clubbing  NEURO: alert & oriented x 3 with fluent speech, no focal motor/sensory deficits  LABORATORY DATA:  I have reviewed the data as listed    Component Value Date/Time   NA 136 (A) 01/29/2021 0000   K 3.7 01/29/2021 0000   CL 100 01/29/2021 0000   CO2 24 (A) 01/29/2021 0000   GLUCOSE 164 (H) 11/20/2020 0800   BUN 25 (A) 01/29/2021 0000   CREATININE 0.8 01/29/2021 0000   CREATININE 0.76 11/20/2020 0800   CALCIUM 9.1 01/29/2021 0000   PROT 7.5 04/29/2020 1501   ALBUMIN 4.3 01/29/2021 0000   AST 25 01/29/2021 0000   ALT 21 01/29/2021 0000   ALKPHOS 55 01/29/2021 0000   BILITOT 0.4 04/29/2020 1501   GFRNONAA >60 11/20/2020 0800   GFRAA >60 05/17/2016 2106    No results found for: SPEP, UPEP  Lab Results  Component Value Date   WBC 3.3 01/29/2021   NEUTROABS 2.57 01/29/2021   HGB 10.5 (A) 01/29/2021   HCT 32 (A) 01/29/2021   MCV 81 01/29/2021   PLT 253 01/29/2021      Chemistry      Component Value Date/Time   NA 136 (A) 01/29/2021 0000   K 3.7 01/29/2021 0000   CL 100 01/29/2021 0000   CO2 24 (A) 01/29/2021 0000   BUN 25 (A) 01/29/2021 0000   CREATININE 0.8 01/29/2021 0000   CREATININE 0.76 11/20/2020  0800   GLU 174 01/29/2021 0000      Component Value Date/Time   CALCIUM 9.1 01/29/2021 0000   ALKPHOS 55 01/29/2021 0000   AST 25 01/29/2021 0000   ALT 21 01/29/2021 0000   BILITOT 0.4 04/29/2020 1501       RADIOGRAPHIC STUDIES: I have personally reviewed the radiological images as listed and agreed with the findings in the report. No results found.

## 2021-02-09 NOTE — Progress Notes (Signed)
David Kelly  [address] [place],  [place]  [address] [phone]  Clinic Day:  02/10/2021  Referring physician: Biagio Borg, MD  ASSESSMENT & PLAN:   Assessment & Plan: Pharyngeal carcinoma, squamous cell ([place]) Stage I HPV positive squamous cell carcinoma of the pharynx diagnosed in September 2022.  He has been receiving concurrent chemoradiation with weekly cisplatin.  He completed 7 cycles of cisplatin on December 19th.  He has 2 radiation treatments remaining.  He is tolerating treatment fairly well, but he has issues with malnutrition and weight loss and required IV fluids.  He has worsening sore throat and painful swallowing, but is still not using his PEG tube effectively.  He is clinically dehydrated again today, so we will give him IV fluids.  We will plan to see him back in 1 week with a CBC and comprehensive metabolic panel for continued supportive care.  Deficiency anemia He had evidence of iron deficiency in October and was placed on oral iron supplement, but has not been able to take this. He has had previous B12 deficiency treated with B12 injections, which he is no longer on. As he has worsening anemia, I repeated vitamin levels and B12, folate and iron studies were normal.  Dehydration He is clinically dehydrated, so we will give him IV fluids again today. We will plan to see weekly for possible IV fluids until he improves.  Weight loss He continues to lose weight, but is not taking the recommended supplements by PEG. I encouraged him to increase to at least 6 daily.   The patient understands the plans discussed today and is in agreement with them.  He knows to contact our office if he develops concerns prior to his next appointment.    David Pickles, PA-C  [facility] [address] [place] [address] Dept: [phone] Dept Fax: [phone]   Orders Placed  This Encounter  Procedures   Ferritin    Standing Status:   Future    Number of Occurrences:   1    Standing Expiration Date:   02/10/2022   Vitamin B12    Standing Status:   Future    Number of Occurrences:   1    Standing Expiration Date:   02/10/2022   Folate    Standing Status:   Future    Number of Occurrences:   1    Standing Expiration Date:   02/10/2022   Iron and TIBC      CHIEF COMPLAINT:  CC: Stage I pharyngeal cancer  Current Treatment: Concurrent chemoradiation with weekly cisplatin   HISTORY OF PRESENT ILLNESS:   Oncology History Overview Note  Immunohistochemistry for p16 is strongly positive.    Pharyngeal carcinoma, squamous cell ([place])  09/10/2020 Initial Diagnosis   He presented with a palpable knot in his upper right neck to Dr. Redmond Baseman on 09/10/20. Patient denied any pain at that time, though did report some difficulty swallowing.     10/20/2020 Pathology Results   FINAL MICROSCOPIC DIAGNOSIS:   A. LYMPH NODE, RIGHT NECK, NEEDLE CORE BIOPSY:  - Metastatic squamous cell carcinoma associated with scanty lymphoid tissue.   10/20/2020 Procedure   Technically successful ultrasound guided biopsy of indeterminate right neck mass/nodal conglomeration.   10/22/2020 Procedure   The fiberoptic laryngoscope was then placed through the nasal passage to view the pharynx and larynx. After completion, the telescope was removed. Findings included normal nasal  passages and no mass or abnormality in the nasopharynx. There is a granular mass involving the right lateral pharyngeal wall inferior to tonsillar area and extending to the midline of the vallecula. The larynx appears normal. Pyriform sinuses are open. Secretions are minimal. Vocal folds are without mass, scarring, or ulceration. The vocal folds adduct and abduct symmetrically. There is good glottal closure. Muscle tension patterns are not present. Laryngeal edema is minimal.    11/11/2020 PET scan   1. Hypermetabolic  mass in the RIGHT hypopharynx localizing to the RIGHT tonsillar pillar and vallecula. 2. Solitary hypermetabolic metastatic lymph node to the RIGHT level II nodal station. 3. No distant metastatic disease.   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[place])   11/16/2020 Cancer Staging   Staging form: Pharynx - HPV-Mediated Oropharynx, AJCC 8th Edition - Clinical stage from 11/16/2020: Stage I (cT2, cN1, cM0, p16+) - Signed by Eppie Gibson, MD on 11/16/2020 Stage prefix: Initial diagnosis    12/22/2020 -  Chemotherapy   Patient is on Treatment Plan : HEAD/NECK Cisplatin q7d        INTERVAL HISTORY:  David Kelly is here today for repeat clinical assessment.  He completed 7 cycles of weekly cisplatin on December 19th. He has been doing fairly well, but required IV fluids again last week.  We were able to get him a hotel room for December 23 through December 27 through donations.  He states he has been able to stay with various people since then.  He states he is only using 3-4 supplements via PEG daily, as he feels full easily.  I encouraged him to increase to at least 6 supplements a day to hopefully avoid further weight loss.  He is only swallowing sips of water due to throat pain and irritation from the radiation. He is using magic mouthwash and tramadol as needed for pain. He denies fevers or chills. He denies pain. His appetite is poor. His weight has decreased 8 pounds over last 1 week .  REVIEW OF SYSTEMS:  Review of Systems  Constitutional:  Positive for fatigue and unexpected weight change. Negative for appetite change, chills and fever.  HENT:   Positive for sore throat and trouble swallowing. Negative for lump/mass and mouth sores.   Respiratory:  Negative for cough and shortness of breath.   Cardiovascular:  Negative for chest pain and leg swelling.  Gastrointestinal:  Negative for abdominal pain, constipation, diarrhea, nausea and vomiting.  Genitourinary:   Negative for difficulty urinating, dysuria, frequency and hematuria.   Musculoskeletal:  Negative for arthralgias, back pain and myalgias.  Skin:  Negative for itching, rash and wound.  Neurological:  Negative for dizziness, extremity weakness, headaches, light-headedness and numbness.  Hematological:  Negative for adenopathy.  Psychiatric/Behavioral:  Negative for depression and sleep disturbance. The patient is not nervous/anxious.     VITALS:  Blood pressure 138/65, pulse 87, temperature 99.4 F (37.4 C), temperature source Oral, resp. rate 18, height 6' (1.829 m), weight 169 lb 6.4 oz (76.8 kg), SpO2 97 %.  Wt Readings from Last 3 Encounters:  02/10/21 169 lb 6.4 oz (76.8 kg)  02/01/21 177 lb 8 oz (80.5 kg)  01/29/21 177 lb 14.4 oz (80.7 kg)    Body mass index is 22.97 kg/m.  Performance status (ECOG): 2 - Symptomatic, <50% confined to bed  PHYSICAL EXAM:  Physical Exam Vitals and nursing note reviewed.  Constitutional:      General: He is not in acute distress.  Appearance: Normal appearance. He is normal weight.  HENT:     Head: Normocephalic and atraumatic.     Mouth/Throat:     Mouth: Mucous membranes are dry.     Pharynx: Oropharynx is clear. No oropharyngeal exudate or posterior oropharyngeal erythema.     Comments: He has thick saliva Eyes:     General: No scleral icterus.    Extraocular Movements: Extraocular movements intact.     Conjunctiva/sclera: Conjunctivae normal.     Pupils: Pupils are equal, round, and reactive to light.  Cardiovascular:     Rate and Rhythm: Normal rate and regular rhythm.     Heart sounds: Normal heart sounds. No murmur heard.   No friction rub. No gallop.  Pulmonary:     Effort: Pulmonary effort is normal.     Breath sounds: Normal breath sounds. No wheezing, rhonchi or rales.  Abdominal:     General: Bowel sounds are normal. There is no distension.     Palpations: Abdomen is soft. There is no hepatomegaly, splenomegaly or mass.      Tenderness: There is no abdominal tenderness.  Musculoskeletal:        General: Normal range of motion.     Cervical back: Normal range of motion and neck supple. No tenderness.     Right lower leg: No edema.     Left lower leg: No edema.  Lymphadenopathy:     Cervical: No cervical adenopathy.     Upper Body:     Right upper body: No supraclavicular or axillary adenopathy.     Left upper body: No supraclavicular or axillary adenopathy.     Lower Body: No right inguinal adenopathy. No left inguinal adenopathy.  Skin:    General: Skin is warm and dry.     Coloration: Skin is not jaundiced.     Findings: No rash.  Neurological:     Mental Status: He is alert and oriented to person, place, and time.     Cranial Nerves: No cranial nerve deficit.  Psychiatric:        Mood and Affect: Mood normal.        Behavior: Behavior normal.        Thought Content: Thought content normal.    LABS:   CBC Latest Ref Rng & Units 02/10/2021 01/29/2021 01/22/2021  WBC - 2.2 3.3 3.4  Hemoglobin 13.5 - 17.5 9.8(A) 10.5(A) 11.7(A)  Hematocrit 41 - 53 31(A) 32(A) 35(A)  Platelets 150 - 399 264 253 276   CMP Latest Ref Rng & Units 02/10/2021 01/29/2021 01/22/2021  Glucose 70 - 99 mg/dL - - -  BUN 4 - 21 38(A) 25(A) 25(A)  Creatinine 0.6 - 1.3 0.8 0.8 0.8  Sodium 137 - 147 143 136(A) 137  Potassium 3.4 - 5.3 3.4 3.7 4.1  Chloride 99 - 108 104 100 99  CO2 13 - 22 31(A) 24(A) 26(A)  Calcium 8.7 - 10.7 8.8 9.1 9.6  Total Protein 6.0 - 8.3 g/dL - - -  Total Bilirubin 0.2 - 1.2 mg/dL - - -  Alkaline Phos 25 - 125 52 55 55  AST 14 - 40 29 25 27   ALT 10 - 40 22 21 25      No results found for: CEA1 / No results found for: CEA1 No results found for: PSA1 No results found for: OMV672 No results found for: CNO709  No results found for: TOTALPROTELP, ALBUMINELP, A1GS, A2GS, BETS, BETA2SER, GAMS, MSPIKE, SPEI Lab Results  Component Value Date  TIBC 255 02/10/2021   TIBC 462 (H) 12/03/2020    FERRITIN 485 (H) 02/10/2021   FERRITIN 60 12/03/2020   IRONPCTSAT 22 02/10/2021   IRONPCTSAT 16 (L) 12/03/2020   IRONPCTSAT 22.8 03/11/2019   No results found for: LDH  STUDIES:  No results found.    HISTORY:   Past Medical History:  Diagnosis Date   Allergy    Arthritis    Blood transfusion without reported diagnosis    with bleeding colon per pt in the past    BPH (benign prostatic hypertrophy)    Cancer (H[facility])    head/neck   Diabetes mellitus    DX  5 YR AGO  Type II   Glaucoma    H/O: GI bleed    History of kidney stones    HLD (hyperlipidemia) 09/28/2017   Myocardial infarction ([place])    in the 1990's    Past Surgical History:  Procedure Laterality Date   COLONOSCOPY     CYSTOSCOPY WITH RETROGRADE PYELOGRAM, URETEROSCOPY AND STENT PLACEMENT Right 05/20/2016   Procedure: CYSTOSCOPY, URETEROSCOPY, TRANSURETHRAL INCISION OF URETHROCELE, BASKET STONE REMOVAL;  Surgeon: Irine Seal, MD;  Location: [facility];  Service: Urology;  Laterality: Right;   Gall Bladder STones removed     left total knee replacement     MULTIPLE EXTRACTIONS WITH ALVEOLOPLASTY N/A 11/20/2020   Procedure: MULTIPLE EXTRACTION WITH ALVEOLOPLASTY;  Surgeon: [name], DMD;  Location: [facility];  Service: Dentistry;  Laterality: N/A;   TOTAL KNEE ARTHROPLASTY  04/04/2011   Procedure: TOTAL KNEE ARTHROPLASTY;  Surgeon: Rudean Haskell, MD;  Location: [facility];  Service: Orthopedics;  Laterality: Left;    Family History  Problem Relation Age of Onset   Arthritis Mother    Asthma Paternal Grandfather    Diabetes Sister    Colon polyps Brother    Colon cancer Neg Hx    Esophageal cancer Neg Hx    Rectal cancer Neg Hx    Stomach cancer Neg Hx     Social History:  reports that he quit smoking about 14 years ago. His smoking use included cigarettes. He has a 67.50 pack-year smoking history. He quit smokeless tobacco use about 2 years ago.  His smokeless tobacco use included chew. He reports that he does  not drink alcohol and does not use drugs.The patient is alone today.  Allergies: No Known Allergies  Current Medications: Current Outpatient Medications  Medication Sig Dispense Refill   azithromycin (ZITHROMAX Z-PAK) 250 MG tablet Take as directed on packet 6 each 0   Blood Glucose Monitoring Suppl (ACCU-CHEK GUIDE ME) w/Device KIT Use as directed twice per day E11.9 1 kit 0   brimonidine-timolol (COMBIGAN) 0.2-0.5 % ophthalmic solution Place 1 drop into both eyes every 12 (twelve) hours. 15 mL 1   diclofenac sodium (VOLTAREN) 1 % GEL Apply 1 application topically 3 (three) times daily. Right knee and both shoulders. 3 Tube 4   ferrous sulfate 325 (65 FE) MG tablet Take 325 mg by mouth daily with breakfast. Informed patient to start taking iron supplement every day. 01/11/21     fluconazole (DIFLUCAN) 100 MG tablet Take 100 mg by mouth daily. Take one tablet daily for 10 days.     Fluocinolone Acetonide 0.01 % OIL Place 5 drops in ear(s) 2 (two) times daily. 20 mL 1   fluticasone (FLONASE) 50 MCG/ACT nasal spray Place 2 sprays into both nostrils daily. 16 g 6   glucose blood (ACCU-CHEK GUIDE) test strip Use as  instructed twice per day E11.9 200 each 12   Lancets MISC Use as directed twice per day E11.9 200 each 12   levocetirizine (XYZAL) 5 MG tablet      magic mouthwash (nystatin, lidocaine, diphenhydrAMINE, alum & mag hydroxide) suspension Swish and swallow 5 mLs 4 (four) times daily as needed for mouth pain. Swish and swallow 74m QID prn pain.     metFORMIN (GLUCOPHAGE) 1000 MG tablet TAKE 1 TABLET BY MOUTH 2 TIMES DAILY WITH A MEAL 180 tablet 3   montelukast (SINGULAIR) 10 MG tablet Take 1 tablet (10 mg total) by mouth at bedtime. 90 tablet 3   Multiple Vitamin (MULTIVITAMIN WITH MINERALS) TABS tablet Take 1 tablet by mouth daily.     naproxen (NAPROSYN) 500 MG tablet Take by mouth.     ondansetron (ZOFRAN) 8 MG tablet Take 1 tablet (8 mg total) by mouth 2 (two) times daily as needed.  Start on the third day after cisplatin chemotherapy. 30 tablet 1   prochlorperazine (COMPAZINE) 10 MG tablet Take 1 tablet (10 mg total) by mouth every 6 (six) hours as needed (Nausea or vomiting). 30 tablet 1   psyllium (METAMUCIL) 58.6 % packet Take 1 packet by mouth daily.     tamsulosin (FLOMAX) 0.4 MG CAPS capsule Take 1 capsule (0.4 mg total) by mouth at bedtime. 90 capsule 3   traMADol (ULTRAM) 50 MG tablet Take 1-2 tablets (50-100 mg total) by mouth every 6 (six) hours as needed. 120 tablet 0   triamcinolone ointment (KENALOG) 0.5 % APPLY TO AFFECTED AREA(S) TWICE DAILY 30 g 2   No current facility-administered medications for this visit.

## 2021-02-09 NOTE — Assessment & Plan Note (Addendum)
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  He has been receiving concurrent chemoradiation with weekly cisplatin.  He completed 7 cycles of cisplatin on December 19th.  He has 2 radiation treatments remaining.  He is tolerating treatment fairly well, but he has issues with malnutrition and weight loss and required IV fluids.  He has worsening sore throat and painful swallowing, but is still not using his PEG tube effectively.  He is clinically dehydrated again today, so we will give him IV fluids.  We will plan to see him back in 1 week with a CBC and comprehensive metabolic panel for continued supportive care.

## 2021-02-10 ENCOUNTER — Encounter: Payer: Self-pay | Admitting: Hematology and Oncology

## 2021-02-10 ENCOUNTER — Telehealth: Payer: Self-pay | Admitting: Dietician

## 2021-02-10 ENCOUNTER — Inpatient Hospital Stay (INDEPENDENT_AMBULATORY_CARE_PROVIDER_SITE_OTHER): Payer: Medicare Other | Admitting: Hematology and Oncology

## 2021-02-10 ENCOUNTER — Inpatient Hospital Stay: Payer: Medicare Other

## 2021-02-10 VITALS — BP 138/65 | HR 87 | Temp 99.4°F | Resp 18 | Ht 72.0 in | Wt 169.4 lb

## 2021-02-10 DIAGNOSIS — R634 Abnormal weight loss: Secondary | ICD-10-CM

## 2021-02-10 DIAGNOSIS — D539 Nutritional anemia, unspecified: Secondary | ICD-10-CM

## 2021-02-10 DIAGNOSIS — C140 Malignant neoplasm of pharynx, unspecified: Secondary | ICD-10-CM | POA: Diagnosis not present

## 2021-02-10 DIAGNOSIS — E860 Dehydration: Secondary | ICD-10-CM

## 2021-02-10 DIAGNOSIS — Z5111 Encounter for antineoplastic chemotherapy: Secondary | ICD-10-CM | POA: Diagnosis not present

## 2021-02-10 LAB — HEPATIC FUNCTION PANEL
ALT: 22 (ref 10–40)
AST: 29 (ref 14–40)
Alkaline Phosphatase: 52 (ref 25–125)
Bilirubin, Total: 0.5

## 2021-02-10 LAB — CBC AND DIFFERENTIAL
HCT: 31 — AB (ref 41–53)
Hemoglobin: 9.8 — AB (ref 13.5–17.5)
Neutrophils Absolute: 1.58
Platelets: 264 (ref 150–399)
WBC: 2.2

## 2021-02-10 LAB — BASIC METABOLIC PANEL
BUN: 38 — AB (ref 4–21)
CO2: 31 — AB (ref 13–22)
Chloride: 104 (ref 99–108)
Creatinine: 0.8 (ref 0.6–1.3)
Glucose: 320
Potassium: 3.4 (ref 3.4–5.3)
Sodium: 143 (ref 137–147)

## 2021-02-10 LAB — IRON AND TIBC
Iron: 57 ug/dL (ref 45–182)
Saturation Ratios: 22 % (ref 17.9–39.5)
TIBC: 255 ug/dL (ref 250–450)
UIBC: 198 ug/dL

## 2021-02-10 LAB — COMPREHENSIVE METABOLIC PANEL
Albumin: 4 (ref 3.5–5.0)
Calcium: 8.8 (ref 8.7–10.7)

## 2021-02-10 LAB — VITAMIN B12: Vitamin B-12: 634 pg/mL (ref 180–914)

## 2021-02-10 LAB — FOLATE: Folate: 7.4 ng/mL (ref >5.9)

## 2021-02-10 LAB — FERRITIN: Ferritin: 485 ng/mL — ABNORMAL HIGH (ref 24–336)

## 2021-02-10 LAB — CBC: RBC: 3.76 — AB (ref 3.87–5.11)

## 2021-02-10 MED ORDER — SODIUM CHLORIDE 0.9 % IV SOLN: Freq: Once | INTRAVENOUS | Status: AC

## 2021-02-10 MED ORDER — HEPARIN SOD (PORK) LOCK FLUSH 100 UNIT/ML IV SOLN
500.0000 [IU] | Freq: Once | INTRAVENOUS | Status: AC | PRN
  Administered 2021-02-10: 500 [IU]

## 2021-02-10 NOTE — Assessment & Plan Note (Signed)
He is clinically dehydrated, so we will give him IV fluids again today. We will plan to see weekly for possible IV fluids until he improves.

## 2021-02-10 NOTE — Assessment & Plan Note (Addendum)
He continues to lose weight, but is not taking the recommended supplements by PEG. I encouraged him to increase to at least 6 daily.

## 2021-02-10 NOTE — Telephone Encounter (Signed)
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 Treatment plan includes concurrent chemoradiation with weekly cisplatin. His radiation treatment finish on 02/11/21, and infusion on 02/12/21 was canceled.  His past medical history includes DM2, MI, GI bleed, anemia, HLD, vitamin B12, deficiency, vitamin D deficiency.  Per NP note he has SDOH barriers that include marginal housing. Cancer team was able to secure a hotel room for him recently through donations.  He has a PEG and is using it regularly.   Spoke with patient on a poor connection which was difficult to understand all of the conversation. However, patient reported he could hear me fine. He relayed that other than occasional water he was not taking anything by mouth, and it has been a long time since he's has eaten. He is using his PEG to give himself feeds and flushes. He is getting his formula when he goes for treatments.  He said he has about a half case right now and the formula he has is Ensure complete.  He's giving himself 2-3 bottles per day and when he goes for treatments he'll get a feeding at his treatment. He is giving himself a syring of water before and after his feeds. He reports he usually can tolerate a full bottle.  We discuss the need to increase the number of feeds to meet his needs and help him stabilize his weight and labs.  He received IVF at hs appointment today because he is also dehydrated and not meeting fluid needs.   Medications: MMW, Compazine, Zofran, Tramadol, Metformin, MVI w/mineral, FeSO4, Miralax   Labs: 02/10/21 BUN  38, Create WNL, Albumin 4.0, Hgb 9.8, Hct 31    Anthropometrics:   Weight loss history is significant 18#, (9.6%) in past month.    Weight /BMI 02/10/2021 02/01/2021  01/29/2021 01/25/2021  WEIGHT 169 lb 6.4 oz 177 lb 8 oz 177 lb 14.4 oz 180 lb 1.5 oz  HEIGHT 6\' 0"  6\' 0"  6\' 0"  6\' 0"   BMI 22.97 kg/m2 24.07 kg/m2 24.13 kg/m2 24.43 kg/m2   Weight /BMI 01/22/2021 01/18/2021 01/11/2021  WEIGHT 180 lb 1.6 oz 184 lb 1.9 oz 187 lb 4.8 oz  HEIGHT 6\' 0"  6\' 0"  6\' 0"   BMI 24.43 kg/m2 24.97 kg/m2 25.4 kg/m2     Estimated Energy Needs  Kcals: 2300-2700 Protein: 77-92 grams Fluid: >/= 2300   NUTRITION DIAGNOSIS: malnourished   MALNUTRITION DIAGNOSIS: Inadequate enteral intake of fluids, energy, and protein AEB significant weight lost.   INTERVENTION: Encourage increasing feeds to 6-7 bottles per day by administering feeds every 2-3 hours with 120 ml FWF before and after feeds.  I tried to encourage him to call if he has any intolerance but he didn't have anything to write with.    MONITORING, EVALUATION, GOAL: TF tolerance, weight, labs   Next Visit: Telephone consult 02/18/20  April Manson, RDN, LDN Registered Dietitian, [place] Part Time Remote (Usual office hours: Tuesday-Thursday) Cell: [phone]

## 2021-02-10 NOTE — Assessment & Plan Note (Addendum)
He had evidence of iron deficiency in October and was placed on oral iron supplement, but has not been able to take this. He has had previous B12 deficiency treated with B12 injections, which he is no longer on. As he has worsening anemia, I repeated vitamin levels and B12, folate and iron studies were normal.

## 2021-02-11 ENCOUNTER — Encounter: Payer: Self-pay | Admitting: Oncology

## 2021-02-12 ENCOUNTER — Ambulatory Visit: Payer: Medicare Other

## 2021-02-12 ENCOUNTER — Inpatient Hospital Stay: Payer: Medicare Other

## 2021-02-12 ENCOUNTER — Encounter: Payer: Self-pay | Admitting: Hematology and Oncology

## 2021-02-12 ENCOUNTER — Encounter: Payer: Self-pay | Admitting: Oncology

## 2021-02-16 ENCOUNTER — Other Ambulatory Visit: Payer: Self-pay | Admitting: Hematology and Oncology

## 2021-02-16 DIAGNOSIS — C140 Malignant neoplasm of pharynx, unspecified: Secondary | ICD-10-CM

## 2021-02-16 MED ORDER — SODIUM CHLORIDE 0.9% FLUSH
10.0000 mL | Freq: Once | INTRAVENOUS | Status: AC | PRN
  Administered 2021-02-16: 10 mL

## 2021-02-16 MED ORDER — SODIUM CHLORIDE 0.9 % IV SOLN: Freq: Once | INTRAVENOUS | Status: AC

## 2021-02-16 MED ORDER — HEPARIN SOD (PORK) LOCK FLUSH 100 UNIT/ML IV SOLN
500.0000 [IU] | Freq: Once | INTRAVENOUS | Status: AC | PRN
  Administered 2021-02-16: 500 [IU]

## 2021-02-17 ENCOUNTER — Encounter: Payer: Self-pay | Admitting: Oncology

## 2021-02-17 ENCOUNTER — Inpatient Hospital Stay: Payer: Medicare Other | Attending: Hematology and Oncology | Admitting: Hematology and Oncology

## 2021-02-17 ENCOUNTER — Encounter: Payer: Self-pay | Admitting: Hematology and Oncology

## 2021-02-17 ENCOUNTER — Other Ambulatory Visit: Payer: Self-pay | Admitting: Hematology and Oncology

## 2021-02-17 ENCOUNTER — Inpatient Hospital Stay: Payer: Medicare Other

## 2021-02-17 ENCOUNTER — Inpatient Hospital Stay: Payer: Medicare Other | Admitting: Dietician

## 2021-02-17 ENCOUNTER — Other Ambulatory Visit (HOSPITAL_COMMUNITY): Payer: Self-pay

## 2021-02-17 ENCOUNTER — Telehealth: Payer: Self-pay | Admitting: Hematology and Oncology

## 2021-02-17 DIAGNOSIS — E860 Dehydration: Secondary | ICD-10-CM | POA: Insufficient documentation

## 2021-02-17 DIAGNOSIS — C140 Malignant neoplasm of pharynx, unspecified: Secondary | ICD-10-CM

## 2021-02-17 DIAGNOSIS — Z836 Family history of other diseases of the respiratory system: Secondary | ICD-10-CM | POA: Insufficient documentation

## 2021-02-17 DIAGNOSIS — Z79899 Other long term (current) drug therapy: Secondary | ICD-10-CM | POA: Insufficient documentation

## 2021-02-17 DIAGNOSIS — Z8371 Family history of colonic polyps: Secondary | ICD-10-CM | POA: Insufficient documentation

## 2021-02-17 DIAGNOSIS — R634 Abnormal weight loss: Secondary | ICD-10-CM | POA: Insufficient documentation

## 2021-02-17 DIAGNOSIS — Z833 Family history of diabetes mellitus: Secondary | ICD-10-CM | POA: Insufficient documentation

## 2021-02-17 DIAGNOSIS — J029 Acute pharyngitis, unspecified: Secondary | ICD-10-CM | POA: Insufficient documentation

## 2021-02-17 DIAGNOSIS — I252 Old myocardial infarction: Secondary | ICD-10-CM | POA: Insufficient documentation

## 2021-02-17 DIAGNOSIS — Z8261 Family history of arthritis: Secondary | ICD-10-CM | POA: Insufficient documentation

## 2021-02-17 LAB — HEPATIC FUNCTION PANEL
ALT: 24 (ref 10–40)
AST: 34 (ref 14–40)
Alkaline Phosphatase: 63 (ref 25–125)
Bilirubin, Total: 1.8

## 2021-02-17 LAB — CBC AND DIFFERENTIAL
HCT: 31 — AB (ref 41–53)
Hemoglobin: 10 — AB (ref 13.5–17.5)
Neutrophils Absolute: 2.93
Platelets: 349 (ref 150–399)
WBC: 3.8

## 2021-02-17 LAB — BASIC METABOLIC PANEL
BUN: 32 — AB (ref 4–21)
CO2: 28 — AB (ref 13–22)
Chloride: 106 (ref 99–108)
Creatinine: 0.8 (ref 0.6–1.3)
Glucose: 242
Potassium: 3.7 (ref 3.4–5.3)
Sodium: 142 (ref 137–147)

## 2021-02-17 LAB — MAGNESIUM: Magnesium: 1.8

## 2021-02-17 LAB — COMPREHENSIVE METABOLIC PANEL
Albumin: 3.8 (ref 3.5–5.0)
Calcium: 9.3 (ref 8.7–10.7)

## 2021-02-17 LAB — CBC: RBC: 3.75 — AB (ref 3.87–5.11)

## 2021-02-17 MED ORDER — HYDROCODONE-ACETAMINOPHEN 7.5-325 MG/15ML PO SOLN
15.0000 mL | ORAL | 0 refills | Status: DC | PRN
  Filled 2021-02-17: qty 240, 3d supply, fill #0

## 2021-02-17 MED ORDER — HYDROCODONE-ACETAMINOPHEN 7.5-325 MG/15ML PO SOLN: 15.0000 mL | ORAL | 0 refills | Status: DC | PRN

## 2021-02-17 NOTE — Telephone Encounter (Signed)
Per 1/4 los next appt scheduled and given to patient °

## 2021-02-17 NOTE — Progress Notes (Signed)
[name]  [address] 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  He has been receiving concurrent chemoradiation with weekly cisplatin.  He completed 7 cycles of cisplatin on December 19th.  He completed radiation on December 29th.  He is tolerated treatment fairly well, but he has issues with malnutrition and weight loss and required IV fluids.  He has worsening sore throat and painful swallowing and is still not using his PEG tube effectively. He ended up walking into clinic yesterday feeling and was orthostatic, so received 1 L of normal saline. As he is now not able to swallow the tramadol, I will place him on hydrocodone/APAP 7.5/325 per 15 mL elixir, 15 mL every 4 hours as needed.  He remains clinically dehydrated, so we will set him up for IV fluids as an outpatient again tomorrow.  We will plan to see him back in 1 week with a CBC and comprehensive metabolic panel for continued supportive care and possible IV fluids.   The patient understands the plans discussed today and is in agreement with them.  He knows to contact our office if he develops concerns prior to his next appointment.      Marvia Pickles, PA-C  [facility] [address] [place] [address] Dept: [phone] Dept Fax: [phone]   No orders of the defined types were placed in this encounter.     CHIEF COMPLAINT:  CC: Stage I pharyngeal cancer  Current Treatment: Supportive care   HISTORY OF PRESENT ILLNESS:   Oncology History Overview Note  Immunohistochemistry for p16 is strongly positive.    Oropharyngeal cancer ([place])  09/10/2020 Initial Diagnosis   He presented with a  palpable knot in his upper right neck to Dr. Redmond Baseman on 09/10/20. Patient denied any pain at that time, though did report some difficulty swallowing.     10/20/2020 Pathology Results   FINAL MICROSCOPIC DIAGNOSIS:   A. LYMPH NODE, RIGHT NECK, NEEDLE CORE BIOPSY:  - Metastatic squamous cell carcinoma associated with scanty lymphoid tissue.   10/20/2020 Procedure   Technically successful ultrasound guided biopsy of indeterminate right neck mass/nodal conglomeration.   10/22/2020 Procedure   The fiberoptic laryngoscope was then placed through the nasal passage to view the pharynx and larynx. After completion, the telescope was removed. Findings included normal nasal passages and no mass or abnormality in the nasopharynx. There is a granular mass involving the right lateral pharyngeal wall inferior to tonsillar area and extending to the midline of the vallecula. The larynx appears normal. Pyriform sinuses are open. Secretions are minimal. Vocal folds are without mass, scarring, or ulceration. The vocal folds adduct and abduct symmetrically. There is good glottal closure. Muscle tension patterns are not present. Laryngeal edema is minimal.    11/11/2020 PET scan   1. Hypermetabolic mass in the RIGHT hypopharynx localizing to the RIGHT tonsillar pillar and vallecula. 2. Solitary hypermetabolic metastatic lymph node to the RIGHT level II nodal station. 3. No distant metastatic disease.   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[place])   11/16/2020 Cancer Staging   Staging form: Pharynx - HPV-Mediated Oropharynx, AJCC 8th Edition - Clinical stage from 11/16/2020: Stage I (cT2, cN1, cM0, p16+) -  Signed by Eppie Gibson, MD on 11/16/2020 Stage prefix: Initial diagnosis    12/22/2020 -  Chemotherapy   Patient is on Treatment Plan : HEAD/NECK Cisplatin q7d        INTERVAL HISTORY:  David Kelly is here today for repeat clinical assessment as he has been having issues with dehydration  and requiring IV fluids.  He received IV fluids again yesterday when he was found to be orthostatic after coming into the clinic off schedule.  He states he is having even more difficulty swallowing this week and is unable to drink water or swallow pills, which he had been doing last week.  He was using tramadol for his throat pain. He denies fevers or chills. He denies pain. His appetite is decreased. His weight has decreased 3 pounds over last week .  He is using his nutritional supplements sporadically, as some days he does not feel up to doing them more than once or twice a day.  Other days he will do 4 or 5 times a day.  Unfortunately, he is back living in his vehicle now that the weather is warmer.  It is unclear as to whether or not he has filled out the paperwork for section 8 housing yet.  He told me last week that his son was doing this for him.  REVIEW OF SYSTEMS:  Review of Systems  Constitutional:  Positive for appetite change. Negative for chills, fatigue, fever and unexpected weight change.  HENT:   Positive for sore throat and trouble swallowing. Negative for lump/mass and mouth sores.   Respiratory:  Negative for cough and shortness of breath.   Cardiovascular:  Negative for chest pain and leg swelling.  Gastrointestinal:  Negative for abdominal pain, constipation, diarrhea, nausea and vomiting.  Genitourinary:  Negative for difficulty urinating, dysuria, frequency and hematuria.   Musculoskeletal:  Negative for arthralgias, back pain and myalgias.  Skin:  Negative for itching, rash and wound.  Neurological:  Negative for dizziness, extremity weakness, headaches, light-headedness and numbness.  Hematological:  Negative for adenopathy.  Psychiatric/Behavioral:  Negative for depression and sleep disturbance. The patient is not nervous/anxious.     VITALS:  Blood pressure 118/72, pulse (!) 110, temperature 98.8 F (37.1 C), temperature source Oral, resp. rate 18, height 6' (1.829 m),  weight 166 lb (75.3 kg), SpO2 98 %.  Wt Readings from Last 3 Encounters:  02/17/21 166 lb (75.3 kg)  02/16/21 168 lb 8 oz (76.4 kg)  02/10/21 169 lb 6.4 oz (76.8 kg)    Body mass index is 22.51 kg/m.  Performance status (ECOG): 2 - Symptomatic, <50% confined to bed  PHYSICAL EXAM:  Physical Exam Vitals and nursing note reviewed.  Constitutional:      General: He is not in acute distress.    Appearance: Normal appearance. He is normal weight.  HENT:     Head: Normocephalic and atraumatic.     Mouth/Throat:     Mouth: Mucous membranes are dry.     Pharynx: Oropharynx is clear. No oropharyngeal exudate or posterior oropharyngeal erythema.  Eyes:     General: No scleral icterus.    Extraocular Movements: Extraocular movements intact.     Conjunctiva/sclera: Conjunctivae normal.     Pupils: Pupils are equal, round, and reactive to light.  Cardiovascular:     Rate and Rhythm: Normal rate and regular rhythm.     Heart sounds: Normal heart sounds. No murmur heard.   No friction rub. No gallop.  Pulmonary:  Effort: Pulmonary effort is normal.     Breath sounds: Normal breath sounds. No wheezing, rhonchi or rales.  Abdominal:     General: Bowel sounds are normal. There is no distension.     Palpations: Abdomen is soft. There is no hepatomegaly, splenomegaly or mass.     Tenderness: There is no abdominal tenderness.  Musculoskeletal:        General: Normal range of motion.     Cervical back: Normal range of motion and neck supple. No tenderness.     Right lower leg: No edema.     Left lower leg: No edema.  Lymphadenopathy:     Cervical: No cervical adenopathy.     Upper Body:     Right upper body: No supraclavicular or axillary adenopathy.     Left upper body: No supraclavicular or axillary adenopathy.     Lower Body: No right inguinal adenopathy. No left inguinal adenopathy.  Skin:    General: Skin is warm and dry.     Coloration: Skin is not jaundiced.     Findings: No  rash.     Comments: Skin turgor is decreased  Neurological:     Mental Status: He is alert and oriented to person, place, and time.     Cranial Nerves: No cranial nerve deficit.  Psychiatric:        Mood and Affect: Mood normal.        Behavior: Behavior normal.        Thought Content: Thought content normal.   LABS:   CBC Latest Ref Rng & Units 02/17/2021 02/10/2021 01/29/2021  WBC - 3.8 2.2 3.3  Hemoglobin 13.5 - 17.5 10.0(A) 9.8(A) 10.5(A)  Hematocrit 41 - 53 31(A) 31(A) 32(A)  Platelets 150 - 399 349 264 253   CMP Latest Ref Rng & Units 02/17/2021 02/10/2021 01/29/2021  Glucose 70 - 99 mg/dL - - -  BUN 4 - 21 32(A) 38(A) 25(A)  Creatinine 0.6 - 1.3 0.8 0.8 0.8  Sodium 137 - 147 142 143 136(A)  Potassium 3.4 - 5.3 3.7 3.4 3.7  Chloride 99 - 108 106 104 100  CO2 13 - 22 28(A) 31(A) 24(A)  Calcium 8.7 - 10.7 9.3 8.8 9.1  Total Protein 6.0 - 8.3 g/dL - - -  Total Bilirubin 0.2 - 1.2 mg/dL - - -  Alkaline Phos 25 - 125 63 52 55  AST 14 - 40 34 29 25  ALT 10 - 40 _0 No results found for: CEA1 / No results found for: CEA1 No results found for: PSA1 No results found for: EZM629 No results found for: CAN125  No results found for: TOTALPROTELP, ALBUMINELP, A1GS, A2GS, BETS, BETA2SER, GAMS, MSPIKE, SPEI Lab Results  Component Value Date   TIBC 255 02/10/2021   TIBC 462 (H) 12/03/2020   FERRITIN 485 (H) 02/10/2021   FERRITIN 60 12/03/2020   IRONPCTSAT 22 02/10/2021   IRONPCTSAT 16 (L) 12/03/2020   IRONPCTSAT 22.8 03/11/2019   No results found for: LDH  STUDIES:  No results found.    HISTORY:   Past Medical History:  Diagnosis Date   Allergy    Arthritis    Blood transfusion without reported diagnosis    with bleeding colon per pt in the past    BPH (benign prostatic hypertrophy)    Cancer ([facility])    head/neck   Diabetes mellitus    DX  5 YR AGO  Type II   Glaucoma  H/O: GI bleed    History of kidney stones    HLD (hyperlipidemia) 09/28/2017    Myocardial infarction ([place])    in the 1990's    Past Surgical History:  Procedure Laterality Date   COLONOSCOPY     CYSTOSCOPY WITH RETROGRADE PYELOGRAM, URETEROSCOPY AND STENT PLACEMENT Right 05/20/2016   Procedure: CYSTOSCOPY, URETEROSCOPY, TRANSURETHRAL INCISION OF URETHROCELE, BASKET STONE REMOVAL;  Surgeon: Irine Seal, MD;  Location: [facility];  Service: Urology;  Laterality: Right;   Gall Bladder STones removed     left total knee replacement     MULTIPLE EXTRACTIONS WITH ALVEOLOPLASTY N/A 11/20/2020   Procedure: MULTIPLE EXTRACTION WITH ALVEOLOPLASTY;  Surgeon: [name], DMD;  Location: [facility];  Service: Dentistry;  Laterality: N/A;   TOTAL KNEE ARTHROPLASTY  04/04/2011   Procedure: TOTAL KNEE ARTHROPLASTY;  Surgeon: Rudean Haskell, MD;  Location: [facility];  Service: Orthopedics;  Laterality: Left;    Family History  Problem Relation Age of Onset   Arthritis Mother    Asthma Paternal Grandfather    Diabetes Sister    Colon polyps Brother    Colon cancer Neg Hx    Esophageal cancer Neg Hx    Rectal cancer Neg Hx    Stomach cancer Neg Hx     Social History:  reports that he quit smoking about 14 years ago. His smoking use included cigarettes. He has a 67.50 pack-year smoking history. He quit smokeless tobacco use about 2 years ago.  His smokeless tobacco use included chew. He reports that he does not drink alcohol and does not use drugs.The patient is alone today.  Allergies: Not on File  Current Medications: Current Outpatient Medications  Medication Sig Dispense Refill   brimonidine-timolol (COMBIGAN) 0.2-0.5 % ophthalmic solution Place 1 drop into both eyes every 12 (twelve) hours. 15 mL 1   HYDROcodone-acetaminophen (HYCET) 7.5-325 mg/15 ml solution Take 15 mLs by mouth every 4 (four) hours as needed for moderate pain. 240 mL 0   Blood Glucose Monitoring Suppl (ACCU-CHEK GUIDE ME) w/Device KIT Use as directed twice per day E11.9 (Patient not taking: Reported on 02/17/2021)  1 kit 0   diclofenac sodium (VOLTAREN) 1 % GEL Apply 1 application topically 3 (three) times daily. Right knee and both shoulders. (Patient not taking: Reported on 02/17/2021) 3 Tube 4   ferrous sulfate 325 (65 FE) MG tablet Take 325 mg by mouth daily with breakfast. Informed patient to start taking iron supplement every day. 01/11/21 (Patient not taking: Reported on 02/17/2021)     fluconazole (DIFLUCAN) 100 MG tablet Take 100 mg by mouth daily. Take one tablet daily for 10 days. (Patient not taking: Reported on 02/17/2021)     Fluocinolone Acetonide 0.01 % OIL Place 5 drops in ear(s) 2 (two) times daily. (Patient not taking: Reported on 02/17/2021) 20 mL 1   fluticasone (FLONASE) 50 MCG/ACT nasal spray Place 2 sprays into both nostrils daily. (Patient not taking: Reported on 02/17/2021) 16 g 6   glucose blood (ACCU-CHEK GUIDE) test strip Use as instructed twice per day E11.9 (Patient not taking: Reported on 02/17/2021) 200 each 12   Lancets MISC Use as directed twice per day E11.9 (Patient not taking: Reported on 02/17/2021) 200 each 12   levocetirizine (XYZAL) 5 MG tablet  (Patient not taking: Reported on 02/17/2021)     magic mouthwash (nystatin, lidocaine, diphenhydrAMINE, alum & mag hydroxide) suspension Swish and swallow 5 mLs 4 (four) times daily as needed for mouth pain. Swish and swallow  66m QID prn pain. (Patient not taking: Reported on 02/17/2021)     metFORMIN (GLUCOPHAGE) 1000 MG tablet TAKE 1 TABLET BY MOUTH 2 TIMES DAILY WITH A MEAL (Patient not taking: Reported on 02/17/2021) 180 tablet 3   montelukast (SINGULAIR) 10 MG tablet Take 1 tablet (10 mg total) by mouth at bedtime. (Patient not taking: Reported on 02/17/2021) 90 tablet 3   Multiple Vitamin (MULTIVITAMIN WITH MINERALS) TABS tablet Take 1 tablet by mouth daily. (Patient not taking: Reported on 02/17/2021)     naproxen (NAPROSYN) 500 MG tablet Take by mouth. (Patient not taking: Reported on 02/17/2021)     ondansetron (ZOFRAN) 8 MG tablet Take 1 tablet  (8 mg total) by mouth 2 (two) times daily as needed. Start on the third day after cisplatin chemotherapy. (Patient not taking: Reported on 02/17/2021) 30 tablet 1   prochlorperazine (COMPAZINE) 10 MG tablet Take 1 tablet (10 mg total) by mouth every 6 (six) hours as needed (Nausea or vomiting). (Patient not taking: Reported on 02/17/2021) 30 tablet 1   psyllium (METAMUCIL) 58.6 % packet Take 1 packet by mouth daily. (Patient not taking: Reported on 02/17/2021)     tamsulosin (FLOMAX) 0.4 MG CAPS capsule Take 1 capsule (0.4 mg total) by mouth at bedtime. (Patient not taking: Reported on 02/17/2021) 90 capsule 3   traMADol (ULTRAM) 50 MG tablet Take 1-2 tablets (50-100 mg total) by mouth every 6 (six) hours as needed. (Patient not taking: Reported on 02/17/2021) 120 tablet 0   triamcinolone ointment (KENALOG) 0.5 % APPLY TO AFFECTED AREA(S) TWICE DAILY (Patient not taking: Reported on 02/17/2021) 30 g 2   No current facility-administered medications for this visit.

## 2021-02-17 NOTE — Progress Notes (Signed)
Nutrition Follow-up:  Met with patient after his follow up visit with PA. He relayed that he is not taking anything by mouth and hasn't for months. He is not taking any of his medications and has no means to crush meds to take via his PEG.  He is taking a variety of liquid feeds through his PEG uses whatever donations he is given. Lately he has been using Glucerna, Ensure Complete, and Osmolite 1.5. He reports thinning formula with small cup of water and flushing with remainder. He motioned that it was about 4 oz.  He doesn't always have a source of running water but will refill small bottles when available. He also states he's taking 7-8 oz of whole milk or 2% for pleasure through tube when available. He has been doing 3-4 feeds most days on good days he gets 5 feeds in. He is aware that goal is 6-7 feeds ( full 250 ml cartons of 1.5 formula). He has also been receiving IVF regularly and is scheduled for fluids tomorrow.  Estimated intake per recall: 1065-1775 kcal, 45-150 g protein, [id number] ml H2O    Assessment: Patient is 79 YOM with Pharyngeal carcinoma, squamous cell ([facility]) Stage I (T2N1M0) HPV positive squamous cell carcinoma of the head and neck, diagnosed in September 2022. He completed his chemo and radiation last week. He completed 7 cycles of cisplatin on December 19 th.  He completed radiation on December 29 th. He is here today for follow up oncology appointment with K. Moser.  He continues to have SDOH barriers that include housing. Nursing relayed that options have been suggested such as LTC, shelters and section 8 which he has refused. He drives to appointments and keeps his supplies in his SUV.Marland Kitchen  Medications: He relayed that he is currently not taking any medications.  Labs: 02/17/21 BUN  32, Create WNL, Albumin 43.8, Hgb 10.0, Hct 31   Anthropometrics:   Weight loss history is significant 18#, (9.6%) in past month, 2.5# over 1 day  Weight History: 02/17/21   166# 02/17/20    168.5# 02/10/21  169.4# 02/01/21 177.0# 01/18/21 184#   Estimated Energy Needs  Kcals: 7544-9201 Protein: 77-92 grams Fluid: >/= 2300  NUTRITION DIAGNOSIS: Inadequate enteral intake overall related to dysphagia AEB continued weight loss and usual recall of enteral feeds.   MALNUTRITION DIAGNOSIS: malnourished   INTERVENTION: Encourage increasing feeds to 6 cartons per day by administering feeds every 2-3 hours with 90 ml FWF before and after feeds.  Provided 3 cases of Isosource 1.5   Goal Isosource 1.5 250 ml seven times per day to provide: 2625 kcal, 119 g protein, 2597 ml (1337 ml H2O from formula + 1260 ml FWF) & 100% DRI for vitamins and minerals  I provided cell phone for any concerns or if he needs supplies.   MONITORING, EVALUATION, GOAL: TF tolerance and intake, weight, labs   Next Visit: Telephone consult 02/24/21  April Manson, RDN, LDN Registered Dietitian, [place] Part Time Remote (Usual office hours: Tuesday-Thursday) Cell: [phone]

## 2021-02-17 NOTE — Assessment & Plan Note (Addendum)
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  He has been receiving concurrent chemoradiation with weekly cisplatin.  He completed 7 cycles of cisplatin on December 19th.  He completed radiation on December 29th.  He is tolerated treatment fairly well, but he has issues with malnutrition and weight loss and required IV fluids.  He has worsening sore throat and painful swallowing and is still not using his PEG tube effectively. He ended up walking into clinic yesterday feeling and was orthostatic, so received 1 L of normal saline. As he is now not able to swallow the tramadol, I will place him on hydrocodone/APAP 7.5/325 per 15 mL elixir, 15 mL every 4 hours as needed.  He remains clinically dehydrated, so we will set him up for IV fluids as an outpatient again tomorrow.  We will plan to see him back in 1 week with a CBC and comprehensive metabolic panel for continued supportive care and possible IV fluids.

## 2021-02-18 ENCOUNTER — Encounter: Payer: Self-pay | Admitting: Oncology

## 2021-02-18 ENCOUNTER — Encounter: Payer: Self-pay | Admitting: Hematology and Oncology

## 2021-02-18 ENCOUNTER — Inpatient Hospital Stay: Payer: Medicare Other

## 2021-02-18 ENCOUNTER — Other Ambulatory Visit: Payer: Self-pay

## 2021-02-18 VITALS — BP 132/76 | HR 83 | Temp 97.9°F | Resp 18 | Ht 72.0 in | Wt 169.0 lb

## 2021-02-18 DIAGNOSIS — R634 Abnormal weight loss: Secondary | ICD-10-CM | POA: Diagnosis not present

## 2021-02-18 DIAGNOSIS — Z8261 Family history of arthritis: Secondary | ICD-10-CM | POA: Diagnosis not present

## 2021-02-18 DIAGNOSIS — I252 Old myocardial infarction: Secondary | ICD-10-CM | POA: Diagnosis not present

## 2021-02-18 DIAGNOSIS — Z836 Family history of other diseases of the respiratory system: Secondary | ICD-10-CM | POA: Diagnosis not present

## 2021-02-18 DIAGNOSIS — E860 Dehydration: Secondary | ICD-10-CM | POA: Diagnosis not present

## 2021-02-18 DIAGNOSIS — C140 Malignant neoplasm of pharynx, unspecified: Secondary | ICD-10-CM | POA: Diagnosis present

## 2021-02-18 DIAGNOSIS — Z79899 Other long term (current) drug therapy: Secondary | ICD-10-CM | POA: Diagnosis not present

## 2021-02-18 DIAGNOSIS — C109 Malignant neoplasm of oropharynx, unspecified: Secondary | ICD-10-CM

## 2021-02-18 DIAGNOSIS — J029 Acute pharyngitis, unspecified: Secondary | ICD-10-CM | POA: Diagnosis not present

## 2021-02-18 DIAGNOSIS — Z8371 Family history of colonic polyps: Secondary | ICD-10-CM | POA: Diagnosis not present

## 2021-02-18 DIAGNOSIS — Z833 Family history of diabetes mellitus: Secondary | ICD-10-CM | POA: Diagnosis not present

## 2021-02-18 MED ORDER — HEPARIN SOD (PORK) LOCK FLUSH 100 UNIT/ML IV SOLN
500.0000 [IU] | Freq: Once | INTRAVENOUS | Status: AC | PRN
  Administered 2021-02-18: 500 [IU]

## 2021-02-18 MED ORDER — SODIUM CHLORIDE 0.9% FLUSH
10.0000 mL | Freq: Once | INTRAVENOUS | Status: AC | PRN
  Administered 2021-02-18: 10 mL

## 2021-02-18 MED ORDER — SODIUM CHLORIDE 0.9 % IV SOLN: Freq: Once | INTRAVENOUS | Status: AC

## 2021-02-18 NOTE — Patient Instructions (Signed)

## 2021-02-19 ENCOUNTER — Other Ambulatory Visit (HOSPITAL_COMMUNITY): Payer: Self-pay

## 2021-02-23 NOTE — Progress Notes (Signed)
[facility]  [address] 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  He has been receiving concurrent chemoradiation with weekly cisplatin.  He completed 7 cycles of cisplatin on December 19th.  He completed radiation on December 29th.  He is tolerated treatment fairly well, but he has issues with malnutrition and weight loss and has required IV fluids frequently.  He will receive IV fluids again this week.  Our dietitian plans a telephone visit with him today.  I will plan to see him back in 1 week for continued supportive care.  Deficiency anemia His hemoglobin slowly improving.  Dehydration He is putting more water in his feeding tube and his BUN has improved, but he is still clinically dehydrated, so I will give him additional IV fluids this week.  Diabetes mellitus ([place]) His glucose remains fairly high, but he has been unable to take his metformin for a few weeks.  We will continue to monitor this.  Weight loss He continues to lose weight, but is still only consistently using 3 supplements a day, when he really requires at least 5.  I encouraged him to continue to try to increase to 5 supplements daily.  Our dietitian plans a telephone visit today.   The patient understands the plans discussed today and is in agreement with them.  He knows to contact our office if he develops concerns prior to his next appointment.      Marvia Pickles, PA-C  [facility] AT [facility] [address] [place] [address] Dept: [phone] Dept Fax: [phone]   No orders of the defined types were placed in this encounter.     CHIEF COMPLAINT:  CC: Stage I HPV positive pharyngeal  cancer  Current Treatment: Supportive care after completing chemoradiation   HISTORY OF PRESENT ILLNESS:   Oncology History Overview Note  Immunohistochemistry for p16 is strongly positive.    Oropharyngeal cancer ([place])  09/10/2020 Initial Diagnosis   He presented with a palpable knot in his upper right neck to Dr. Redmond Baseman on 09/10/20. Patient denied any pain at that time, though did report some difficulty swallowing.     10/20/2020 Pathology Results   FINAL MICROSCOPIC DIAGNOSIS:   A. LYMPH NODE, RIGHT NECK, NEEDLE CORE BIOPSY:  - Metastatic squamous cell carcinoma associated with scanty lymphoid tissue.   10/20/2020 Procedure   Technically successful ultrasound guided biopsy of indeterminate right neck mass/nodal conglomeration.   10/22/2020 Procedure   The fiberoptic laryngoscope was then placed through the nasal passage to view the pharynx and larynx. After completion, the telescope was removed. Findings included normal nasal passages and no mass or abnormality in the nasopharynx. There is a granular mass involving the right lateral pharyngeal wall inferior to tonsillar area and extending to the midline of the vallecula. The larynx appears normal. Pyriform sinuses are open. Secretions are minimal. Vocal folds are without mass, scarring, or ulceration. The vocal folds adduct and abduct symmetrically. There is good glottal closure. Muscle tension patterns are not present. Laryngeal edema is minimal.    11/11/2020 PET scan   1. Hypermetabolic mass in the RIGHT hypopharynx localizing to the RIGHT tonsillar pillar and vallecula. 2. Solitary hypermetabolic metastatic lymph node to the RIGHT level  II nodal station. 3. No distant metastatic disease.   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[place])   11/16/2020 Cancer Staging   Staging form: Pharynx - HPV-Mediated Oropharynx, AJCC 8th Edition - Clinical stage from 11/16/2020: Stage I (cT2, cN1, cM0, p16+) - Signed by  Eppie Gibson, MD on 11/16/2020 Stage prefix: Initial diagnosis    12/22/2020 -  Chemotherapy   Patient is on Treatment Plan : HEAD/NECK Cisplatin q7d        INTERVAL HISTORY:  [place] is here today for continued supportive care.  Unfortunately, his social situation has not improved and he continues to live in his vehicle.  He is still unable to swallow pills.  He complains of persistent sore throat, dysphagia and thick saliva.  He states he is putting more water in his PEG tube, but only consistently taking 3 supplements daily.  He feels full easily.  His nausea, vomiting, diarrhea, constipation or abdominal pain.  He denies lightheadedness.  He denies fevers or chills. He denies pain. His appetite remains poor. His weight has decreased 3.5 pounds over last 1 week .  He feels mildly anxious and depressed.  REVIEW OF SYSTEMS:  Review of Systems  Constitutional:  Positive for appetite change and unexpected weight change. Negative for chills, fatigue and fever.  HENT:   Positive for sore throat and trouble swallowing. Negative for lump/mass and mouth sores.   Respiratory:  Negative for cough and shortness of breath.   Cardiovascular:  Negative for chest pain and leg swelling.  Gastrointestinal:  Negative for abdominal pain, constipation, diarrhea, nausea and vomiting.  Genitourinary:  Negative for difficulty urinating, dysuria, frequency and hematuria.   Musculoskeletal:  Negative for arthralgias, back pain and myalgias.  Skin:  Negative for itching, rash and wound.  Neurological:  Negative for dizziness, extremity weakness, headaches, light-headedness and numbness.  Hematological:  Negative for adenopathy.  Psychiatric/Behavioral:  Positive for depression. Negative for sleep disturbance. The patient is nervous/anxious.     VITALS:  Blood pressure 106/71, pulse (!) 118, temperature 98.7 F (37.1 C), temperature source Oral, resp. rate 20, height 6' (1.829 m), weight 165 lb 8 oz (75.1 kg),  SpO2 95 %.  Wt Readings from Last 3 Encounters:  02/24/21 165 lb 8 oz (75.1 kg)  02/18/21 169 lb (76.7 kg)  02/17/21 166 lb (75.3 kg)    Body mass index is 22.45 kg/m.  Performance status (ECOG): 2 - Symptomatic, <50% confined to bed  PHYSICAL EXAM:  Physical Exam Vitals and nursing note reviewed.  Constitutional:      General: He is not in acute distress.    Appearance: Normal appearance. He is normal weight.  HENT:     Head: Normocephalic and atraumatic.     Mouth/Throat:     Mouth: Mucous membranes are dry.     Pharynx: Oropharynx is clear. No oropharyngeal exudate or posterior oropharyngeal erythema.     Comments: There is thick saliva in the mouth and posterior pharynx Eyes:     General: No scleral icterus.    Extraocular Movements: Extraocular movements intact.     Conjunctiva/sclera: Conjunctivae normal.     Pupils: Pupils are equal, round, and reactive to light.  Cardiovascular:     Rate and Rhythm: Regular rhythm. Tachycardia present.     Heart sounds: Normal heart sounds. No murmur heard.   No friction rub. No gallop.  Pulmonary:     Effort: Pulmonary effort is normal.     Breath sounds: Normal breath  sounds. No wheezing, rhonchi or rales.  Abdominal:     General: Bowel sounds are normal. There is no distension.     Palpations: Abdomen is soft. There is no hepatomegaly, splenomegaly or mass.     Tenderness: There is no abdominal tenderness.  Musculoskeletal:        General: Normal range of motion.     Cervical back: Normal range of motion and neck supple. No tenderness.     Right lower leg: No edema.     Left lower leg: No edema.  Lymphadenopathy:     Cervical: No cervical adenopathy.     Upper Body:     Right upper body: No supraclavicular or axillary adenopathy.     Left upper body: No supraclavicular or axillary adenopathy.     Lower Body: No right inguinal adenopathy. No left inguinal adenopathy.  Skin:    General: Skin is warm and dry.      Coloration: Skin is not jaundiced.     Findings: No rash.  Neurological:     Mental Status: He is alert and oriented to person, place, and time.     Cranial Nerves: No cranial nerve deficit.  Psychiatric:        Mood and Affect: Mood normal.        Behavior: Behavior normal.        Thought Content: Thought content normal.    LABS:   CBC Latest Ref Rng & Units 02/24/2021 02/17/2021 02/10/2021  WBC - 5.4 3.8 2.2  Hemoglobin 13.5 - 17.5 10.4(A) 10.0(A) 9.8(A)  Hematocrit 41 - 53 33(A) 31(A) 31(A)  Platelets 150 - 399 349 349 264   CMP Latest Ref Rng & Units 02/24/2021 02/17/2021 02/10/2021  Glucose 70 - 99 mg/dL - - -  BUN 4 - 21 19 32(A) 38(A)  Creatinine 0.6 - 1.3 0.8 0.8 0.8  Sodium 137 - 147 136(A) 142 143  Potassium 3.4 - 5.3 4.3 3.7 3.4  Chloride 99 - 108 100 106 104  CO2 13 - 22 28(A) 28(A) 31(A)  Calcium 8.7 - 10.7 9.4 9.3 8.8  Total Protein 6.0 - 8.3 g/dL - - -  Total Bilirubin 0.2 - 1.2 mg/dL - - -  Alkaline Phos 25 - 125 64 63 52  AST 14 - 40 24 34 29  ALT 10 - 40 18 24 22      No results found for: CEA1 / No results found for: CEA1 No results found for: PSA1 No results found for: VPX106 No results found for: CAN125  No results found for: TOTALPROTELP, ALBUMINELP, A1GS, A2GS, BETS, BETA2SER, GAMS, MSPIKE, SPEI Lab Results  Component Value Date   TIBC 255 02/10/2021   TIBC 462 (H) 12/03/2020   FERRITIN 485 (H) 02/10/2021   FERRITIN 60 12/03/2020   IRONPCTSAT 22 02/10/2021   IRONPCTSAT 16 (L) 12/03/2020   IRONPCTSAT 22.8 03/11/2019   No results found for: LDH  STUDIES:  No results found.    HISTORY:   Past Medical History:  Diagnosis Date   Allergy    Arthritis    Blood transfusion without reported diagnosis    with bleeding colon per pt in the past    BPH (benign prostatic hypertrophy)    Cancer ([facility])    head/neck   Diabetes mellitus    DX  5 YR AGO  Type II   Glaucoma    H/O: GI bleed    History of kidney stones    HLD (hyperlipidemia)  09/28/2017  Myocardial infarction ([place])    in the 1990's    Past Surgical History:  Procedure Laterality Date   COLONOSCOPY     CYSTOSCOPY WITH RETROGRADE PYELOGRAM, URETEROSCOPY AND STENT PLACEMENT Right 05/20/2016   Procedure: CYSTOSCOPY, URETEROSCOPY, TRANSURETHRAL INCISION OF URETHROCELE, BASKET STONE REMOVAL;  Surgeon: Irine Seal, MD;  Location: [facility];  Service: Urology;  Laterality: Right;   Gall Bladder STones removed     left total knee replacement     MULTIPLE EXTRACTIONS WITH ALVEOLOPLASTY N/A 11/20/2020   Procedure: MULTIPLE EXTRACTION WITH ALVEOLOPLASTY;  Surgeon: [name], DMD;  Location: [facility];  Service: Dentistry;  Laterality: N/A;   TOTAL KNEE ARTHROPLASTY  04/04/2011   Procedure: TOTAL KNEE ARTHROPLASTY;  Surgeon: Rudean Haskell, MD;  Location: [facility];  Service: Orthopedics;  Laterality: Left;    Family History  Problem Relation Age of Onset   Arthritis Mother    Asthma Paternal Grandfather    Diabetes Sister    Colon polyps Brother    Colon cancer Neg Hx    Esophageal cancer Neg Hx    Rectal cancer Neg Hx    Stomach cancer Neg Hx     Social History:  reports that he quit smoking about 14 years ago. His smoking use included cigarettes. He has a 67.50 pack-year smoking history. He quit smokeless tobacco use about 2 years ago.  His smokeless tobacco use included chew. He reports that he does not drink alcohol and does not use drugs.The patient is alone today.  Allergies: No Known Allergies  Current Medications: Current Outpatient Medications  Medication Sig Dispense Refill   Blood Glucose Monitoring Suppl (ACCU-CHEK GUIDE ME) w/Device KIT Use as directed twice per day E11.9 (Patient not taking: Reported on 02/17/2021) 1 kit 0   brimonidine-timolol (COMBIGAN) 0.2-0.5 % ophthalmic solution Place 1 drop into both eyes every 12 (twelve) hours. 15 mL 1   diclofenac sodium (VOLTAREN) 1 % GEL Apply 1 application topically 3 (three) times daily. Right knee and both  shoulders. (Patient not taking: Reported on 02/17/2021) 3 Tube 4   ferrous sulfate 325 (65 FE) MG tablet Take 325 mg by mouth daily with breakfast. Informed patient to start taking iron supplement every day. 01/11/21 (Patient not taking: Reported on 02/17/2021)     fluconazole (DIFLUCAN) 100 MG tablet Take 100 mg by mouth daily. Take one tablet daily for 10 days. (Patient not taking: Reported on 02/17/2021)     Fluocinolone Acetonide 0.01 % OIL Place 5 drops in ear(s) 2 (two) times daily. (Patient not taking: Reported on 02/17/2021) 20 mL 1   fluticasone (FLONASE) 50 MCG/ACT nasal spray Place 2 sprays into both nostrils daily. (Patient not taking: Reported on 02/17/2021) 16 g 6   glucose blood (ACCU-CHEK GUIDE) test strip Use as instructed twice per day E11.9 (Patient not taking: Reported on 02/17/2021) 200 each 12   HYDROcodone-acetaminophen (HYCET) 7.5-325 mg/15 ml solution Take 15 mLs by mouth every 4 (four) hours as needed for moderate pain. 240 mL 0   Lancets MISC Use as directed twice per day E11.9 (Patient not taking: Reported on 02/17/2021) 200 each 12   levocetirizine (XYZAL) 5 MG tablet  (Patient not taking: Reported on 02/17/2021)     magic mouthwash (nystatin, lidocaine, diphenhydrAMINE, alum & mag hydroxide) suspension Swish and swallow 5 mLs 4 (four) times daily as needed for mouth pain. Swish and swallow 70m QID prn pain. (Patient not taking: Reported on 02/17/2021)     metFORMIN (GLUCOPHAGE) 1000 MG tablet  TAKE 1 TABLET BY MOUTH 2 TIMES DAILY WITH A MEAL (Patient not taking: Reported on 02/17/2021) 180 tablet 3   montelukast (SINGULAIR) 10 MG tablet Take 1 tablet (10 mg total) by mouth at bedtime. (Patient not taking: Reported on 02/17/2021) 90 tablet 3   Multiple Vitamin (MULTIVITAMIN WITH MINERALS) TABS tablet Take 1 tablet by mouth daily. (Patient not taking: Reported on 02/17/2021)     naproxen (NAPROSYN) 500 MG tablet Take by mouth. (Patient not taking: Reported on 02/17/2021)     ondansetron (ZOFRAN) 8 MG  tablet Take 1 tablet (8 mg total) by mouth 2 (two) times daily as needed. Start on the third day after cisplatin chemotherapy. (Patient not taking: Reported on 02/17/2021) 30 tablet 1   prochlorperazine (COMPAZINE) 10 MG tablet Take 1 tablet (10 mg total) by mouth every 6 (six) hours as needed (Nausea or vomiting). (Patient not taking: Reported on 02/17/2021) 30 tablet 1   psyllium (METAMUCIL) 58.6 % packet Take 1 packet by mouth daily. (Patient not taking: Reported on 02/17/2021)     tamsulosin (FLOMAX) 0.4 MG CAPS capsule Take 1 capsule (0.4 mg total) by mouth at bedtime. (Patient not taking: Reported on 02/17/2021) 90 capsule 3   traMADol (ULTRAM) 50 MG tablet Take 1-2 tablets (50-100 mg total) by mouth every 6 (six) hours as needed. (Patient not taking: Reported on 02/17/2021) 120 tablet 0   triamcinolone ointment (KENALOG) 0.5 % APPLY TO AFFECTED AREA(S) TWICE DAILY (Patient not taking: Reported on 02/17/2021) 30 g 2   No current facility-administered medications for this visit.

## 2021-02-23 NOTE — Assessment & Plan Note (Addendum)
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  He has been receiving concurrent chemoradiation with weekly cisplatin.  He completed 7 cycles of cisplatin on December 19th.  He completed radiation on December 29th.  He is tolerated treatment fairly well, but he has issues with malnutrition and weight loss and has required IV fluids frequently.  He will receive IV fluids again this week.  Our dietitian plans a telephone visit with him today.  I will plan to see him back in 1 week for continued supportive care.

## 2021-02-24 ENCOUNTER — Ambulatory Visit: Payer: Medicare Other

## 2021-02-24 ENCOUNTER — Inpatient Hospital Stay: Payer: Medicare Other

## 2021-02-24 ENCOUNTER — Encounter: Payer: Self-pay | Admitting: Hematology and Oncology

## 2021-02-24 ENCOUNTER — Telehealth: Payer: Self-pay | Admitting: Dietician

## 2021-02-24 ENCOUNTER — Other Ambulatory Visit: Payer: Self-pay | Admitting: Hematology and Oncology

## 2021-02-24 ENCOUNTER — Inpatient Hospital Stay (INDEPENDENT_AMBULATORY_CARE_PROVIDER_SITE_OTHER): Payer: Medicare Other | Admitting: Hematology and Oncology

## 2021-02-24 ENCOUNTER — Encounter: Payer: Self-pay | Admitting: Oncology

## 2021-02-24 ENCOUNTER — Other Ambulatory Visit: Payer: Self-pay

## 2021-02-24 DIAGNOSIS — R634 Abnormal weight loss: Secondary | ICD-10-CM | POA: Diagnosis not present

## 2021-02-24 DIAGNOSIS — D539 Nutritional anemia, unspecified: Secondary | ICD-10-CM

## 2021-02-24 DIAGNOSIS — C140 Malignant neoplasm of pharynx, unspecified: Secondary | ICD-10-CM

## 2021-02-24 DIAGNOSIS — C109 Malignant neoplasm of oropharynx, unspecified: Secondary | ICD-10-CM | POA: Diagnosis not present

## 2021-02-24 DIAGNOSIS — E860 Dehydration: Secondary | ICD-10-CM

## 2021-02-24 LAB — CBC: RBC: 3.96 (ref 3.87–5.11)

## 2021-02-24 LAB — COMPREHENSIVE METABOLIC PANEL
Albumin: 3.9 (ref 3.5–5.0)
Calcium: 9.4 (ref 8.7–10.7)

## 2021-02-24 LAB — BASIC METABOLIC PANEL
BUN: 19 (ref 4–21)
CO2: 28 — AB (ref 13–22)
Chloride: 100 (ref 99–108)
Creatinine: 0.8 (ref 0.6–1.3)
Glucose: 313
Potassium: 4.3 (ref 3.4–5.3)
Sodium: 136 — AB (ref 137–147)

## 2021-02-24 LAB — CBC AND DIFFERENTIAL
HCT: 33 — AB (ref 41–53)
Hemoglobin: 10.4 — AB (ref 13.5–17.5)
Neutrophils Absolute: 4.37
Platelets: 349 (ref 150–399)
WBC: 5.4

## 2021-02-24 LAB — MAGNESIUM

## 2021-02-24 LAB — HEPATIC FUNCTION PANEL
ALT: 18 (ref 10–40)
AST: 24 (ref 14–40)
Alkaline Phosphatase: 64 (ref 25–125)
Bilirubin, Total: 0.5

## 2021-02-24 NOTE — Assessment & Plan Note (Addendum)
He is putting more water in his feeding tube and his BUN has improved, but he is still clinically dehydrated, so I will give him additional IV fluids this week.

## 2021-02-24 NOTE — Assessment & Plan Note (Signed)
His glucose remains fairly high, but he has been unable to take his metformin for a few weeks.  We will continue to monitor this.

## 2021-02-24 NOTE — Assessment & Plan Note (Signed)
His hemoglobin slowly improving.

## 2021-02-24 NOTE — Assessment & Plan Note (Signed)
He continues to lose weight, but is still only consistently using 3 supplements a day, when he really requires at least 5.  I encouraged him to continue to try to increase to 5 supplements daily.  Our dietitian plans a telephone visit today.

## 2021-02-25 ENCOUNTER — Encounter: Payer: Self-pay | Admitting: Oncology

## 2021-02-25 ENCOUNTER — Encounter: Payer: Self-pay | Admitting: Hematology and Oncology

## 2021-02-25 ENCOUNTER — Inpatient Hospital Stay: Payer: Medicare Other | Attending: Hematology and Oncology

## 2021-02-25 ENCOUNTER — Telehealth: Payer: Self-pay | Admitting: Dietician

## 2021-02-25 VITALS — BP 131/68 | HR 94 | Temp 98.2°F | Resp 18 | Ht 72.0 in | Wt 167.8 lb

## 2021-02-25 DIAGNOSIS — E860 Dehydration: Secondary | ICD-10-CM | POA: Diagnosis not present

## 2021-02-25 DIAGNOSIS — C140 Malignant neoplasm of pharynx, unspecified: Secondary | ICD-10-CM | POA: Insufficient documentation

## 2021-02-25 DIAGNOSIS — C109 Malignant neoplasm of oropharynx, unspecified: Secondary | ICD-10-CM

## 2021-02-25 MED ORDER — HEPARIN SOD (PORK) LOCK FLUSH 100 UNIT/ML IV SOLN
500.0000 [IU] | Freq: Once | INTRAVENOUS | Status: AC | PRN
  Administered 2021-02-25: 500 [IU]

## 2021-02-25 MED ORDER — SODIUM CHLORIDE 0.9 % IV SOLN: Freq: Once | INTRAVENOUS | Status: AC

## 2021-02-25 MED ORDER — SODIUM CHLORIDE 0.9% FLUSH
10.0000 mL | Freq: Once | INTRAVENOUS | Status: AC | PRN
  Administered 2021-02-25: 10 mL

## 2021-02-25 NOTE — Patient Instructions (Signed)

## 2021-02-25 NOTE — Telephone Encounter (Signed)
Attempted to reach patient during infusion for a scheduled telephone nutrition follow up visit. Provided my cell# on voice mail to return call.  April Manson, RDN, LDN Registered Dietitian, [place] Part Time Remote (Usual office hours: Tuesday-Thursday) Cell: [phone]

## 2021-02-25 NOTE — Telephone Encounter (Signed)
Called patient for telephone follow up on TF tolerance and intake. I provided my cell# on voice mail to return call. Requested scheduler assistance in reminding patient to return call  to set up a follow up nutrition consult.  April Manson, RDN, LDN Registered Dietitian, [place] Part Time Remote (Usual office hours: Tuesday-Thursday) Cell: [phone]

## 2021-02-25 NOTE — Telephone Encounter (Signed)
Patient's son Raquel Sarna called into my cell on patient behalf. He requested that the follow up telephone call be rescheduled as patient wasn't talking very well.  I asked if calling during his infusion IVF 02/25/21 would be OK. He said patient agreed to rescheduling.  April Manson, RDN, LDN Registered Dietitian, [place] Part Time Remote (Usual office hours: Tuesday-Thursday) Cell: [phone]

## 2021-02-25 NOTE — Progress Notes (Signed)
1340:PT STABLE AT TIME OF DISCHARGE

## 2021-03-01 ENCOUNTER — Other Ambulatory Visit: Payer: Self-pay | Admitting: Hematology and Oncology

## 2021-03-01 ENCOUNTER — Other Ambulatory Visit: Payer: Self-pay

## 2021-03-01 ENCOUNTER — Inpatient Hospital Stay: Payer: Medicare Other

## 2021-03-01 VITALS — BP 130/87 | HR 103 | Temp 98.0°F | Resp 18 | Ht 72.0 in | Wt 164.0 lb

## 2021-03-01 DIAGNOSIS — C140 Malignant neoplasm of pharynx, unspecified: Secondary | ICD-10-CM | POA: Diagnosis not present

## 2021-03-01 DIAGNOSIS — C109 Malignant neoplasm of oropharynx, unspecified: Secondary | ICD-10-CM

## 2021-03-01 MED ORDER — SODIUM CHLORIDE 0.9 % IV SOLN: Freq: Once | INTRAVENOUS | Status: AC

## 2021-03-01 MED ORDER — HEPARIN SOD (PORK) LOCK FLUSH 100 UNIT/ML IV SOLN
500.0000 [IU] | Freq: Once | INTRAVENOUS | Status: AC | PRN
  Administered 2021-03-01: 500 [IU]

## 2021-03-01 MED ORDER — SODIUM CHLORIDE 0.9% FLUSH
10.0000 mL | Freq: Once | INTRAVENOUS | Status: AC | PRN
  Administered 2021-03-01: 10 mL

## 2021-03-01 NOTE — Patient Instructions (Signed)

## 2021-03-03 ENCOUNTER — Telehealth: Payer: Self-pay | Admitting: Hematology and Oncology

## 2021-03-03 ENCOUNTER — Inpatient Hospital Stay: Payer: Medicare Other

## 2021-03-03 ENCOUNTER — Other Ambulatory Visit: Payer: Self-pay | Admitting: Hematology and Oncology

## 2021-03-03 ENCOUNTER — Encounter: Payer: Self-pay | Admitting: Hematology and Oncology

## 2021-03-03 ENCOUNTER — Inpatient Hospital Stay (INDEPENDENT_AMBULATORY_CARE_PROVIDER_SITE_OTHER): Payer: Medicare Other | Admitting: Hematology and Oncology

## 2021-03-03 ENCOUNTER — Other Ambulatory Visit: Payer: Self-pay

## 2021-03-03 DIAGNOSIS — C109 Malignant neoplasm of oropharynx, unspecified: Secondary | ICD-10-CM | POA: Diagnosis not present

## 2021-03-03 DIAGNOSIS — R634 Abnormal weight loss: Secondary | ICD-10-CM | POA: Diagnosis not present

## 2021-03-03 DIAGNOSIS — E860 Dehydration: Secondary | ICD-10-CM | POA: Diagnosis not present

## 2021-03-03 DIAGNOSIS — C140 Malignant neoplasm of pharynx, unspecified: Secondary | ICD-10-CM

## 2021-03-03 LAB — CBC AND DIFFERENTIAL
HCT: 31 — AB (ref 41–53)
Hemoglobin: 9.9 — AB (ref 13.5–17.5)
Neutrophils Absolute: 3.64
Platelets: 310 (ref 150–399)
WBC: 5.6

## 2021-03-03 LAB — BASIC METABOLIC PANEL WITH GFR
BUN: 13 (ref 4–21)
CO2: 23 — AB (ref 13–22)
Chloride: 100 (ref 99–108)
Creatinine: 0.9 (ref 0.6–1.3)
Glucose: 125
Potassium: 3.7 (ref 3.4–5.3)
Sodium: 136 — AB (ref 137–147)

## 2021-03-03 LAB — HEPATIC FUNCTION PANEL
ALT: 23 (ref 10–40)
AST: 49 — AB (ref 14–40)
Alkaline Phosphatase: 61 (ref 25–125)
Bilirubin, Total: 0.6

## 2021-03-03 LAB — CBC
MCV: 83 — AB (ref 80–94)
RBC: 3.76 — AB (ref 3.87–5.11)

## 2021-03-03 LAB — COMPREHENSIVE METABOLIC PANEL WITH GFR
Albumin: 4 (ref 3.5–5.0)
Calcium: 9.4 (ref 8.7–10.7)

## 2021-03-03 NOTE — Assessment & Plan Note (Addendum)
Weight is stable today. He is tolerating and managing feedings well.

## 2021-03-03 NOTE — Assessment & Plan Note (Signed)
He is putting more water in his feeding tube and his BUN has improved, but he is still clinically dehydrated, so I will give him additional IV fluids this week.

## 2021-03-03 NOTE — Assessment & Plan Note (Addendum)
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  He has been receiving concurrent chemoradiation with weekly cisplatin.  He completed 7 cycles of cisplatin on December 19th.  He completed radiation on December 29th.  He is tolerated treatment fairly well, but he has issues with malnutrition and weight loss and has required IV fluids frequently. He does not appear dehydrated today and has been managing his feedings well. Hemoglobin in 9.9 today. We will have him return 1-2 weeks for evaluation with Dr. Hinton Rao for future planning.

## 2021-03-03 NOTE — Telephone Encounter (Signed)
Patient has been scheduled for follow-up visit per 03/03/21 los. Pt given an appt calendar with date and time. ° °

## 2021-03-03 NOTE — Progress Notes (Signed)
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  He has been receiving concurrent chemoradiation with weekly cisplatin.  He completed 7 cycles of cisplatin on December 19th.  He completed radiation on December 29th.  He is tolerated treatment fairly well, but he has issues with malnutrition and weight loss and has required IV fluids frequently. He does not appear dehydrated today and has been managing his feedings well. Hemoglobin in 9.9 today. We will have him return 1-2 weeks for evaluation with Dr. Hinton Rao for future planning.   Dehydration He is putting more water in his feeding tube and his BUN has improved, but he is still clinically dehydrated, so I will give him additional IV fluids this week.  Weight loss Weight is stable today. He is tolerating and managing feedings well.    The patient understands the plans discussed today and is in agreement with them.  He knows to contact our office if he develops concerns prior to his next appointment.    Melodye Ped, NP  [facility] [address] [place] [address] Dept: [phone] Dept Fax: [phone]   No orders of the defined types were placed in this encounter.     CHIEF COMPLAINT:  CC: [name] with history of head/neck cancer here for weekly  evaluation  Current Treatment:  Surveillance  INTERVAL HISTORY:  David Kelly is here today for repeat clinical assessment. He denies fevers or chills. He denies pain. His appetite is fair. His weight has been stable. He has some constipation today and for the last few days. I have provided him with laxatives. He denies shortness of breath, chest pain or cough.   I have reviewed the past medical history, past surgical history, social history and family history with the patient and they are unchanged from previous note.  ALLERGIES:  has no allergies on file.  MEDICATIONS:  Current Outpatient Medications  Medication Sig Dispense Refill   Blood Glucose Monitoring Suppl (ACCU-CHEK GUIDE ME) w/Device KIT Use as directed twice per day E11.9 (Patient not taking: Reported on 02/17/2021) 1 kit 0   brimonidine-timolol (COMBIGAN) 0.2-0.5 % ophthalmic solution Place 1 drop into both eyes every 12 (twelve) hours. 15 mL 1   diclofenac sodium (VOLTAREN) 1 % GEL Apply 1 application topically 3 (three) times daily. Right knee and both shoulders. (Patient not taking: Reported on 02/17/2021) 3 Tube 4   ferrous sulfate 325 (65 FE) MG tablet Take 325 mg by mouth daily with breakfast. Informed patient to start taking iron supplement every day. 01/11/21 (Patient not taking: Reported on 02/17/2021)     fluconazole (DIFLUCAN) 100 MG tablet Take 100 mg by mouth daily. Take one tablet daily for 10 days. (Patient not taking: Reported on 02/17/2021)     Fluocinolone Acetonide 0.01 % OIL Place 5 drops in ear(s) 2 (two) times daily. (Patient not taking: Reported on 02/17/2021) 20 mL 1   fluticasone (FLONASE) 50 MCG/ACT nasal  spray Place 2 sprays into both nostrils daily. (Patient not taking: Reported on 02/17/2021) 16 g 6   glucose blood (ACCU-CHEK GUIDE) test strip Use as instructed twice per day E11.9 (Patient not taking: Reported on 02/17/2021) 200 each 12   HYDROcodone-acetaminophen (HYCET) 7.5-325 mg/15 ml solution Take 15 mLs by mouth  every 4 (four) hours as needed for moderate pain. 240 mL 0   Lancets MISC Use as directed twice per day E11.9 (Patient not taking: Reported on 02/17/2021) 200 each 12   levocetirizine (XYZAL) 5 MG tablet  (Patient not taking: Reported on 02/17/2021)     magic mouthwash (nystatin, lidocaine, diphenhydrAMINE, alum & mag hydroxide) suspension Swish and swallow 5 mLs 4 (four) times daily as needed for mouth pain. Swish and swallow 51m QID prn pain. (Patient not taking: Reported on 02/17/2021)     metFORMIN (GLUCOPHAGE) 1000 MG tablet TAKE 1 TABLET BY MOUTH 2 TIMES DAILY WITH A MEAL (Patient not taking: Reported on 02/17/2021) 180 tablet 3   montelukast (SINGULAIR) 10 MG tablet Take 1 tablet (10 mg total) by mouth at bedtime. (Patient not taking: Reported on 02/17/2021) 90 tablet 3   Multiple Vitamin (MULTIVITAMIN WITH MINERALS) TABS tablet Take 1 tablet by mouth daily. (Patient not taking: Reported on 02/17/2021)     naproxen (NAPROSYN) 500 MG tablet Take by mouth. (Patient not taking: Reported on 02/17/2021)     ondansetron (ZOFRAN) 8 MG tablet Take 1 tablet (8 mg total) by mouth 2 (two) times daily as needed. Start on the third day after cisplatin chemotherapy. (Patient not taking: Reported on 02/17/2021) 30 tablet 1   prochlorperazine (COMPAZINE) 10 MG tablet Take 1 tablet (10 mg total) by mouth every 6 (six) hours as needed (Nausea or vomiting). (Patient not taking: Reported on 02/17/2021) 30 tablet 1   psyllium (METAMUCIL) 58.6 % packet Take 1 packet by mouth daily. (Patient not taking: Reported on 02/17/2021)     tamsulosin (FLOMAX) 0.4 MG CAPS capsule Take 1 capsule (0.4 mg total) by mouth at bedtime. (Patient not taking: Reported on 02/17/2021) 90 capsule 3   traMADol (ULTRAM) 50 MG tablet Take 1-2 tablets (50-100 mg total) by mouth every 6 (six) hours as needed. (Patient not taking: Reported on 02/17/2021) 120 tablet 0   triamcinolone ointment (KENALOG) 0.5 % APPLY TO AFFECTED AREA(S) TWICE DAILY (Patient not taking:  Reported on 02/17/2021) 30 g 2   No current facility-administered medications for this visit.    HISTORY OF PRESENT ILLNESS:   Oncology History Overview Note  Immunohistochemistry for p16 is strongly positive.    Oropharyngeal cancer ([place]  09/10/2020 Initial Diagnosis   He presented with a palpable knot in his upper right neck to Dr. BRedmond Basemanon 09/10/20. Patient denied any pain at that time, though did report some difficulty swallowing.     10/20/2020 Pathology Results   FINAL MICROSCOPIC DIAGNOSIS:   A. LYMPH NODE, RIGHT NECK, NEEDLE CORE BIOPSY:  - Metastatic squamous cell carcinoma associated with scanty lymphoid tissue.   10/20/2020 Procedure   Technically successful ultrasound guided biopsy of indeterminate right neck mass/nodal conglomeration.   10/22/2020 Procedure   The fiberoptic laryngoscope was then placed through the nasal passage to view the pharynx and larynx. After completion, the telescope was removed. Findings included normal nasal passages and no mass or abnormality in the nasopharynx. There is a granular mass involving the right lateral pharyngeal wall inferior to tonsillar area and extending to the midline of the vallecula. The larynx appears normal. Pyriform sinuses  are open. Secretions are minimal. Vocal folds are without mass, scarring, or ulceration. The vocal folds adduct and abduct symmetrically. There is good glottal closure. Muscle tension patterns are not present. Laryngeal edema is minimal.    11/11/2020 PET scan   1. Hypermetabolic mass in the RIGHT hypopharynx localizing to the RIGHT tonsillar pillar and vallecula. 2. Solitary hypermetabolic metastatic lymph node to the RIGHT level II nodal station. 3. No distant metastatic disease.   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[place])   11/16/2020 Cancer Staging   Staging form: Pharynx - HPV-Mediated Oropharynx, AJCC 8th Edition - Clinical stage from 11/16/2020: Stage I (cT2, cN1, cM0,  p16+) - Signed by Eppie Gibson, MD on 11/16/2020 Stage prefix: Initial diagnosis    12/22/2020 -  Chemotherapy   Patient is on Treatment Plan : HEAD/NECK Cisplatin q7d         REVIEW OF SYSTEMS:   Constitutional: Denies fevers, chills or abnormal weight loss Eyes: Denies blurriness of vision Ears, nose, mouth, throat, and face: Denies mucositis or sore throat Respiratory: Denies cough, dyspnea or wheezes Cardiovascular: Denies palpitation, chest discomfort or lower extremity swelling Gastrointestinal:  Denies nausea, heartburn or change in bowel habits Skin: Denies abnormal skin rashes Lymphatics: Denies new lymphadenopathy or easy bruising Neurological:Denies numbness, tingling or new weaknesses Behavioral/Psych: Mood is stable, no new changes  All other systems were reviewed with the patient and are negative.   VITALS:  Blood pressure 112/67, pulse (!) 110, temperature 99 F (37.2 C), temperature source Oral, resp. rate 20, height 6' (1.829 m), weight 162 lb 9.6 oz (73.8 kg), SpO2 97 %.  Wt Readings from Last 3 Encounters:  03/03/21 162 lb 9.6 oz (73.8 kg)  03/01/21 164 lb (74.4 kg)  03/01/21 163 lb 12.8 oz (74.3 kg)    Body mass index is 22.05 kg/m.  Performance status (ECOG): 1 - Symptomatic but completely ambulatory  PHYSICAL EXAM:   GENERAL:alert, no distress and comfortable SKIN: skin color, texture, turgor are normal, no rashes or significant lesions EYES: normal, Conjunctiva are pink and non-injected, sclera clear OROPHARYNX:no exudate, no erythema and lips, buccal mucosa, and tongue normal  NECK: supple, thyroid normal size, non-tender, without nodularity LYMPH:  no palpable lymphadenopathy in the cervical, axillary or inguinal LUNGS: clear to auscultation and percussion with normal breathing effort HEART: regular rate & rhythm and no murmurs and no lower extremity edema ABDOMEN:abdomen soft, non-tender and normal bowel sounds Musculoskeletal:no cyanosis of  digits and no clubbing  NEURO: alert & oriented x 3 with fluent speech, no focal motor/sensory deficits  LABORATORY DATA:  I have reviewed the data as listed    Component Value Date/Time   NA 136 (A) 03/03/2021 0000   K 3.7 03/03/2021 0000   CL 100 03/03/2021 0000   CO2 23 (A) 03/03/2021 0000   GLUCOSE 164 (H) 11/20/2020 0800   BUN 13 03/03/2021 0000   CREATININE 0.9 03/03/2021 0000   CREATININE 0.76 11/20/2020 0800   CALCIUM 9.4 03/03/2021 0000   PROT 7.5 04/29/2020 1501   ALBUMIN 4.0 03/03/2021 0000   AST 49 (A) 03/03/2021 0000   ALT 23 03/03/2021 0000   ALKPHOS 61 03/03/2021 0000   BILITOT 0.4 04/29/2020 1501   GFRNONAA >60 11/20/2020 0800   GFRAA >60 05/17/2016 2106    No results found for: SPEP, UPEP  Lab Results  Component Value Date   WBC 5.6 03/03/2021   NEUTROABS 3.64 03/03/2021   HGB 9.9 (A) 03/03/2021   HCT  31 (A) 03/03/2021   MCV 83 (A) 03/03/2021   PLT 310 03/03/2021      Chemistry      Component Value Date/Time   NA 136 (A) 03/03/2021 0000   K 3.7 03/03/2021 0000   CL 100 03/03/2021 0000   CO2 23 (A) 03/03/2021 0000   BUN 13 03/03/2021 0000   CREATININE 0.9 03/03/2021 0000   CREATININE 0.76 11/20/2020 0800   GLU 125 03/03/2021 0000      Component Value Date/Time   CALCIUM 9.4 03/03/2021 0000   ALKPHOS 61 03/03/2021 0000   AST 49 (A) 03/03/2021 0000   ALT 23 03/03/2021 0000   BILITOT 0.4 04/29/2020 1501       RADIOGRAPHIC STUDIES: I have personally reviewed the radiological images as listed and agreed with the findings in the report. No results found.

## 2021-03-04 ENCOUNTER — Inpatient Hospital Stay: Payer: Medicare Other | Admitting: Dietician

## 2021-03-04 ENCOUNTER — Telehealth: Payer: Self-pay | Admitting: Dietician

## 2021-03-04 ENCOUNTER — Inpatient Hospital Stay: Payer: Medicare Other

## 2021-03-04 NOTE — Telephone Encounter (Signed)
Attempter to reach patient for a scheduled nutrition follow up by telephone.  He didn't answer, left message to call.

## 2021-03-05 ENCOUNTER — Encounter: Payer: Self-pay | Admitting: Oncology

## 2021-03-09 NOTE — Progress Notes (Signed)
[name]  [address] [place],  [place]  [address] [phone]  Clinic Day:  03/15/2021  Referring physician: Biagio Borg, MD  This document serves as a record of services personally performed by [name], MD. It was created on their behalf by Curry,Lauren E, a trained medical scribe. The creation of this record is based on the scribe's personal observations and the provider's statements to them.  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  He has a mass involving the right lateral pharyngeal wall inferior to the tonsillar region and extending to the midline of the vallecular. He completed concurrent chemoradiation with weekly cisplatin in December. He tolerated  fairly well, but he has issues with malnutrition and weight loss.   Solitary hypermetabolic metastatic lymph node in the right level 2 nodal region, measuring 3.2 cm.  Anemia. He does have a history of B12 deficiency. Iron studies are consistent with iron deficiency and we recommend iron supplement daily. If he does not show improvement in his anemia, we will need to evaluate his compliance with the iron supplement.  Weight loss. He is stable today and managing tube feedings.  He completed concurrent chemoradiaton with weekly cisplatin in December. We will plan to see him back in 1 month with CBC, CMP and CT neck for repeat evaluation. He understands and agrees with this plan of care. I have answered his questions and he knows to call with any concerns.  I provided 20 minutes of face-to-face time during this this encounter and > 50% was spent counseling as documented under my assessment and plan.    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 This began in July when the patient was able to palpate swelling of the right side of his neck. This area was not painful and he denied any dysphagia. He followed up with Dr. Melida Quitter of otolaryngology on July 28th who performed FNA. Pathology from this procedure revealed rare atypical cells of undermined significance. Repeat FNA was performed on August 16th and pathology revealed acute and chronic inflammation, with no malignant cell identified. Eventually, right neck lymph node biopsy was obtained on September 6th and final pathology confirmed metastatic squamous cell carcinoma associated with scanty lymphoid tissue.  Laryngoscopy performed on September 8th revealed a mass involving the right lateral pharyngeal wall inferior to the tonsillar region and extending to the midline of the vallecular.  NM PET was performed on September 28th, revealing increased radiotracer uptake in the patient's right hypopharynx. A solitary hypermetabolic metastatic lymph node in the right level 2 nodal region was also observed, measuring 3.2 cm. No distant metastatic disease was seen. He was seen in consultation by Dr. Isidore Moos and Dr. Alvy Bimler and concurrent chemoradiation with weekly cisplatin was recommended. As the patient lives locally, he would like treatment to be carried out at our facility. He uses a cane to ambulate, and had a knee replacement 10 years ago.   INTERVAL HISTORY:  I have  reviewed his chart and materials related to his cancer extensively and collaborated history with the patient. Summary of oncologic history is as follows: Oncology History Overview Note  Immunohistochemistry for p16 is strongly positive.    Oropharyngeal  cancer ([place])  09/10/2020 Initial Diagnosis   He presented with a palpable knot in his upper right neck to Dr. Redmond Baseman on 09/10/20. Patient denied any pain at that time, though did report some difficulty swallowing.     10/20/2020 Pathology Results   FINAL MICROSCOPIC DIAGNOSIS:   A. LYMPH NODE, RIGHT NECK, NEEDLE CORE BIOPSY:  - Metastatic squamous cell carcinoma associated with scanty lymphoid tissue.   10/20/2020 Procedure   Technically successful ultrasound guided biopsy of indeterminate right neck mass/nodal conglomeration.   10/22/2020 Procedure   The fiberoptic laryngoscope was then placed through the nasal passage to view the pharynx and larynx. After completion, the telescope was removed. Findings included normal nasal passages and no mass or abnormality in the nasopharynx. There is a granular mass involving the right lateral pharyngeal wall inferior to tonsillar area and extending to the midline of the vallecula. The larynx appears normal. Pyriform sinuses are open. Secretions are minimal. Vocal folds are without mass, scarring, or ulceration. The vocal folds adduct and abduct symmetrically. There is good glottal closure. Muscle tension patterns are not present. Laryngeal edema is minimal.    11/11/2020 PET scan   1. Hypermetabolic mass in the RIGHT hypopharynx localizing to the RIGHT tonsillar pillar and vallecula. 2. Solitary hypermetabolic metastatic lymph node to the RIGHT level II nodal station. 3. No distant metastatic disease.   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[place])   11/16/2020 Cancer Staging   Staging form: Pharynx - HPV-Mediated Oropharynx, AJCC 8th Edition - Clinical stage from 11/16/2020: Stage I (cT2, cN1, cM0, p16+) - Signed by Eppie Gibson, MD on 11/16/2020 Stage prefix: Initial diagnosis    12/22/2020 -  Chemotherapy   Patient is on Treatment Plan : HEAD/NECK Cisplatin q7d      Izaya is here for routine follow up after completing  chemoradiation in December. He continues tube feedings 3-4 times per day as he is still not able to eat orally. He states that the hydrocodone-acetaminophen 7.5-325 mg/15 mL solution is on backorder and so he has not been able to pick this up. He has been crushing up hydrocodone 7.5/325 since he ran out of the liquid medication, and we will refill this today. He continues to have chronic arthralgias of the knees and is hoping to have surgery soon. He asks about referral back to the dentist for further management, and we will arrange this for him. Blood counts and chemistries are unremarkable except for a hemoglobin of 10.1, stable to mildly improved. His  appetite is good, and he has lost 1 pound since his last visit.  He denies fever, chills or other signs of infection.  He denies nausea, vomiting, bowel issues, or abdominal pain.  He denies sore throat, cough, dyspnea, or chest pain.  HISTORY:   Allergies: No Known Allergies  Current Medications: Current Outpatient Medications  Medication Sig Dispense Refill   Blood Glucose Monitoring Suppl (ACCU-CHEK GUIDE ME) w/Device KIT Use as directed twice per day E11.9 (Patient not taking: Reported on 02/17/2021) 1 kit 0   brimonidine-timolol (COMBIGAN) 0.2-0.5 % ophthalmic solution Place 1 drop into both eyes every 12 (twelve) hours. 15 mL 1   diclofenac sodium (VOLTAREN) 1 % GEL Apply 1 application topically 3 (three) times daily. Right knee  and both shoulders. (Patient not taking: Reported on 02/17/2021) 3 Tube 4   ferrous sulfate 325 (65 FE) MG tablet Take 325 mg by mouth daily with breakfast. Informed patient to start taking iron supplement every day. 01/11/21 (Patient not taking: Reported on 02/17/2021)     fluconazole (DIFLUCAN) 100 MG tablet Take 100 mg by mouth daily. Take one tablet daily for 10 days. (Patient not taking: Reported on 02/17/2021)     Fluocinolone Acetonide 0.01 % OIL Place 5 drops in ear(s) 2 (two) times daily. (Patient not taking: Reported  on 02/17/2021) 20 mL 1   fluticasone (FLONASE) 50 MCG/ACT nasal spray Place 2 sprays into both nostrils daily. (Patient not taking: Reported on 02/17/2021) 16 g 6   glucose blood (ACCU-CHEK GUIDE) test strip Use as instructed twice per day E11.9 (Patient not taking: Reported on 02/17/2021) 200 each 12   HYDROcodone-acetaminophen (HYCET) 7.5-325 mg/15 ml solution Take 15 mLs by mouth every 4 (four) hours as needed for moderate pain. 240 mL 0   Lancets MISC Use as directed twice per day E11.9 (Patient not taking: Reported on 02/17/2021) 200 each 12   levocetirizine (XYZAL) 5 MG tablet  (Patient not taking: Reported on 02/17/2021)     magic mouthwash (nystatin, lidocaine, diphenhydrAMINE, alum & mag hydroxide) suspension Swish and swallow 5 mLs 4 (four) times daily as needed for mouth pain. Swish and swallow 7m QID prn pain. (Patient not taking: Reported on 02/17/2021)     metFORMIN (GLUCOPHAGE) 1000 MG tablet TAKE 1 TABLET BY MOUTH 2 TIMES DAILY WITH A MEAL (Patient not taking: Reported on 02/17/2021) 180 tablet 3   montelukast (SINGULAIR) 10 MG tablet Take 1 tablet (10 mg total) by mouth at bedtime. (Patient not taking: Reported on 02/17/2021) 90 tablet 3   Multiple Vitamin (MULTIVITAMIN WITH MINERALS) TABS tablet Take 1 tablet by mouth daily. (Patient not taking: Reported on 02/17/2021)     naproxen (NAPROSYN) 500 MG tablet Take by mouth. (Patient not taking: Reported on 02/17/2021)     ondansetron (ZOFRAN) 8 MG tablet Take 1 tablet (8 mg total) by mouth 2 (two) times daily as needed. Start on the third day after cisplatin chemotherapy. (Patient not taking: Reported on 02/17/2021) 30 tablet 1   prochlorperazine (COMPAZINE) 10 MG tablet Take 1 tablet (10 mg total) by mouth every 6 (six) hours as needed (Nausea or vomiting). (Patient not taking: Reported on 02/17/2021) 30 tablet 1   psyllium (METAMUCIL) 58.6 % packet Take 1 packet by mouth daily. (Patient not taking: Reported on 02/17/2021)     tamsulosin (FLOMAX) 0.4 MG CAPS  capsule Take 1 capsule (0.4 mg total) by mouth at bedtime. (Patient not taking: Reported on 02/17/2021) 90 capsule 3   traMADol (ULTRAM) 50 MG tablet Take 1-2 tablets (50-100 mg total) by mouth every 6 (six) hours as needed. (Patient not taking: Reported on 02/17/2021) 120 tablet 0   triamcinolone ointment (KENALOG) 0.5 % APPLY TO AFFECTED AREA(S) TWICE DAILY (Patient not taking: Reported on 02/17/2021) 30 g 2   No current facility-administered medications for this visit.    REVIEW OF SYSTEMS:  Review of Systems  Constitutional:  Negative for appetite change, chills, fatigue, fever and unexpected weight change.  HENT:   Positive for trouble swallowing (dysphagia).   Eyes: Negative.   Respiratory: Negative.  Negative for chest tightness, cough, hemoptysis, shortness of breath and wheezing.   Cardiovascular: Negative.  Negative for chest pain, leg swelling and palpitations.  Gastrointestinal: Negative.  Negative for abdominal distention,  abdominal pain, blood in stool, constipation, diarrhea, nausea and vomiting.  Endocrine: Negative.   Genitourinary: Negative.  Negative for difficulty urinating, dysuria, frequency and hematuria.   Musculoskeletal:  Positive for arthralgias (of the right knee, chronic). Negative for back pain, flank pain, gait problem and myalgias.  Skin: Negative.   Neurological: Negative.  Negative for dizziness, extremity weakness, gait problem, headaches, light-headedness, numbness, seizures and speech difficulty.  Hematological: Negative.   Psychiatric/Behavioral: Negative.  Negative for depression and sleep disturbance. The patient is not nervous/anxious.   All other systems reviewed and are negative.   VITALS:  Blood pressure (!) 137/56, pulse (!) 113, temperature 98.4 F (36.9 C), temperature source Oral, resp. rate 20, height 6' (1.829 m), weight 161 lb 8 oz (73.3 kg), SpO2 98 %.  Wt Readings from Last 3 Encounters:  03/15/21 161 lb 8 oz (73.3 kg)  03/11/21 159 lb 1.6  oz (72.2 kg)  03/11/21 159 lb 4 oz (72.2 kg)    Body mass index is 21.9 kg/m.  Performance status (ECOG): 1 - Symptomatic but completely ambulatory  PHYSICAL EXAM:  Physical Exam Constitutional:      General: He is not in acute distress.    Appearance: Normal appearance. He is normal weight.  HENT:     Head: Normocephalic and atraumatic.  Eyes:     General: No scleral icterus.    Extraocular Movements: Extraocular movements intact.     Conjunctiva/sclera: Conjunctivae normal.     Pupils: Pupils are equal, round, and reactive to light.  Cardiovascular:     Rate and Rhythm: Regular rhythm. Tachycardia present.     Pulses: Normal pulses.     Heart sounds: Normal heart sounds. No murmur heard.   No friction rub. No gallop.  Pulmonary:     Effort: Pulmonary effort is normal. No respiratory distress.     Breath sounds: Normal breath sounds.  Abdominal:     General: Bowel sounds are normal. There is no distension.     Palpations: Abdomen is soft. There is no hepatomegaly, splenomegaly or mass.     Tenderness: There is no abdominal tenderness.  Musculoskeletal:        General: Normal range of motion.     Cervical back: Normal range of motion and neck supple.     Right lower leg: No edema.     Left lower leg: No edema.  Lymphadenopathy:     Cervical: No cervical adenopathy.  Skin:    General: Skin is warm and dry.  Neurological:     General: No focal deficit present.     Mental Status: He is alert and oriented to person, place, and time. Mental status is at baseline.  Psychiatric:        Mood and Affect: Mood normal.        Behavior: Behavior normal.        Thought Content: Thought content normal.        Judgment: Judgment normal.    LABS:   CBC Latest Ref Rng & Units 03/15/2021 03/03/2021 02/24/2021  WBC - 6.3 5.6 5.4  Hemoglobin 13.5 - 17.5 10.1(A) 9.9(A) 10.4(A)  Hematocrit 41 - 53 29(A) 31(A) 33(A)  Platelets 150 - 399 314 310 349   CMP Latest Ref Rng & Units  03/15/2021 03/03/2021 02/24/2021  Glucose 70 - 99 mg/dL - - -  BUN 4 - 21 17 13 19   Creatinine 0.6 - 1.3 0.8 0.9 0.8  Sodium 137 - 147 137 136(A) 136(A)  Potassium  3.4 - 5.3 3.9 3.7 4.3  Chloride 99 - 108 102 100 100  CO2 13 - 22 28(A) 23(A) 28(A)  Calcium 8.7 - 10.7 9.5 9.4 9.4  Total Protein 6.0 - 8.3 g/dL - - -  Total Bilirubin 0.2 - 1.2 mg/dL - - -  Alkaline Phos 25 - 125 54 61 64  AST 14 - 40 44(A) 49(A) 24  ALT 10 - 40 18 23 18     Lab Results  Component Value Date   TIBC 255 02/10/2021   TIBC 462 (H) 12/03/2020   FERRITIN 485 (H) 02/10/2021   FERRITIN 60 12/03/2020   IRONPCTSAT 22 02/10/2021   IRONPCTSAT 16 (L) 12/03/2020   IRONPCTSAT 22.8 03/11/2019   No results found for: LDH  STUDIES:  No results found.     I, Rita Ohara, am acting as scribe for [name], MD  I have reviewed this report as typed by the medical scribe, and it is complete and accurate.

## 2021-03-10 ENCOUNTER — Telehealth: Payer: Self-pay | Admitting: Dietician

## 2021-03-10 NOTE — Telephone Encounter (Signed)
Nutrition Follow-up:  Called patient on mobile telephone. He report he didn't have much time to talk as he was on way to pain doctor.  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 He completed his chemo and radiation.  He is still not taking anything by mouth.    He still only doing 3-4 feeds most days but he reports he has increased water to 1 pint a day. He is aware that goal is at minimum 6 cartons per day. ( full 250 ml cartons of 1.5 formula). He did not report last week for scheduled IVF.  He states he has 1/2 case of formula left. I told him RN could provide more formula tomorrow at his f/u appointment with Dr. Orlene Erm 03/11/21. He was unaware he had appointment scheduled.  Estimated intake per recall: 1420 kcal, 60 g protein, 1200 ml H2O  Medications: He relayed that he is currently not taking any medications.  Labs: review 03/03/21  Anthropometrics:   Weight loss history is significant 14.9#, (8.3%) in past month, 47#, 22% past 4 months Weight History: 03/03/21   162.6# 02/17/21   166# 02/17/20   168.5# 02/10/21  169.4# 02/01/21 177.0# 01/18/21 184#   Estimated Energy Needs  Kcals: 6503-5465 Protein: 77-92 grams Fluid: >/= 2300  NUTRITION DIAGNOSIS: Inadequate enteral intake overall related to dysphagia AEB continued weight loss and usual recall of enteral feeds. Continues with inadequate enteral intake r/t early satiety and fear of feeling too full.   MALNUTRITION DIAGNOSIS: suspect severe malnutrition of chronic disease, unable to perform NFPE   INTERVENTION: Encourage increasing feeds to 1.5 cartons per feed 4-5 times a day. Increase FWF to 2 bottles and using 90 ml before and after feeds with extra 1 cup FWF to goal. Recommended he get supplies and formula from RN at f/u appointment 03/11/21.  Goal TF Osmolite 1.5 1 355 ml 4 times per  day to provide: 2135 kcal, 104 g protein, 2597 ml (1086 ml H2O from formula + 960 ml FWF) & 100% DRI for vitamins and minerals  I provided cell phone for any concerns or if he needs supplies.   MONITORING, EVALUATION, GOAL: TF tolerance and intake, weight, labs   Next Visit: Telephone consult 03/17/21  April Manson, RDN, LDN Registered Dietitian, [place] Part Time Remote (Usual office hours: Tuesday-Thursday) Cell: [phone]

## 2021-03-11 ENCOUNTER — Other Ambulatory Visit: Payer: Self-pay

## 2021-03-11 ENCOUNTER — Other Ambulatory Visit: Payer: Self-pay | Admitting: Hematology and Oncology

## 2021-03-11 ENCOUNTER — Inpatient Hospital Stay: Payer: Medicare Other

## 2021-03-11 VITALS — BP 118/78 | HR 99 | Temp 98.9°F | Resp 18 | Ht 72.0 in | Wt 159.2 lb

## 2021-03-11 DIAGNOSIS — C109 Malignant neoplasm of oropharynx, unspecified: Secondary | ICD-10-CM

## 2021-03-11 DIAGNOSIS — C140 Malignant neoplasm of pharynx, unspecified: Secondary | ICD-10-CM | POA: Diagnosis not present

## 2021-03-11 MED ORDER — SODIUM CHLORIDE 0.9 % IV SOLN: Freq: Once | INTRAVENOUS | Status: AC

## 2021-03-11 MED ORDER — HYDROCODONE-ACETAMINOPHEN 7.5-325 MG/15ML PO SOLN: 15.0000 mL | ORAL | 0 refills | Status: DC | PRN

## 2021-03-11 MED ORDER — SODIUM CHLORIDE 0.9% FLUSH
10.0000 mL | Freq: Once | INTRAVENOUS | Status: AC | PRN
  Administered 2021-03-11: 10 mL

## 2021-03-11 MED ORDER — HEPARIN SOD (PORK) LOCK FLUSH 100 UNIT/ML IV SOLN
500.0000 [IU] | Freq: Once | INTRAVENOUS | Status: AC | PRN
  Administered 2021-03-11: 500 [IU]

## 2021-03-11 NOTE — Patient Instructions (Signed)

## 2021-03-12 ENCOUNTER — Encounter: Payer: Self-pay | Admitting: Oncology

## 2021-03-15 ENCOUNTER — Encounter: Payer: Self-pay | Admitting: Oncology

## 2021-03-15 ENCOUNTER — Other Ambulatory Visit: Payer: Self-pay | Admitting: Hematology and Oncology

## 2021-03-15 ENCOUNTER — Inpatient Hospital Stay (INDEPENDENT_AMBULATORY_CARE_PROVIDER_SITE_OTHER): Payer: Medicare Other | Admitting: Oncology

## 2021-03-15 ENCOUNTER — Inpatient Hospital Stay: Payer: Medicare Other

## 2021-03-15 ENCOUNTER — Other Ambulatory Visit: Payer: Self-pay | Admitting: Oncology

## 2021-03-15 ENCOUNTER — Other Ambulatory Visit: Payer: Self-pay

## 2021-03-15 VITALS — BP 137/56 | HR 113 | Temp 98.4°F | Resp 20 | Ht 72.0 in | Wt 161.5 lb

## 2021-03-15 DIAGNOSIS — C770 Secondary and unspecified malignant neoplasm of lymph nodes of head, face and neck: Secondary | ICD-10-CM | POA: Diagnosis not present

## 2021-03-15 DIAGNOSIS — C140 Malignant neoplasm of pharynx, unspecified: Secondary | ICD-10-CM

## 2021-03-15 DIAGNOSIS — C109 Malignant neoplasm of oropharynx, unspecified: Secondary | ICD-10-CM

## 2021-03-15 LAB — BASIC METABOLIC PANEL
BUN: 17 (ref 4–21)
CO2: 28 — AB (ref 13–22)
Chloride: 102 (ref 99–108)
Creatinine: 0.8 (ref 0.6–1.3)
Glucose: 108
Potassium: 3.9 (ref 3.4–5.3)
Sodium: 137 (ref 137–147)

## 2021-03-15 LAB — CBC AND DIFFERENTIAL
HCT: 29 — AB (ref 41–53)
Hemoglobin: 10.1 — AB (ref 13.5–17.5)
Neutrophils Absolute: 4.54
Platelets: 314 (ref 150–399)
WBC: 6.3

## 2021-03-15 LAB — HEPATIC FUNCTION PANEL
ALT: 18 (ref 10–40)
AST: 44 — AB (ref 14–40)
Alkaline Phosphatase: 54 (ref 25–125)
Bilirubin, Total: 0.5

## 2021-03-15 LAB — COMPREHENSIVE METABOLIC PANEL
Albumin: 4 (ref 3.5–5.0)
Calcium: 9.5 (ref 8.7–10.7)

## 2021-03-15 LAB — CBC: RBC: 3.48 — AB (ref 3.87–5.11)

## 2021-03-15 LAB — MAGNESIUM: Magnesium: 1.7

## 2021-03-15 MED ORDER — HYDROCODONE-ACETAMINOPHEN 7.5-325 MG PO TABS: 1.0000 | ORAL_TABLET | ORAL | 0 refills | Status: DC | PRN

## 2021-03-17 ENCOUNTER — Other Ambulatory Visit: Payer: Self-pay

## 2021-03-17 ENCOUNTER — Inpatient Hospital Stay: Payer: Medicare Other | Attending: Hematology and Oncology | Admitting: Dietician

## 2021-03-17 NOTE — Progress Notes (Signed)
Called patient for a follow up on his tube feedings and ability to take any PO. He said he's doing about as well as can be expected. Connection disconnected, I called again and received his voice mail. Left message with contact information.  April Manson, RDN, LDN Registered Dietitian, [place] Part Time Remote (Usual office hours: Tuesday-Thursday) Cell: [phone]

## 2021-03-22 ENCOUNTER — Encounter: Payer: Self-pay | Admitting: Oncology

## 2021-03-25 ENCOUNTER — Encounter: Payer: Self-pay | Admitting: Oncology

## 2021-03-25 ENCOUNTER — Encounter: Payer: Self-pay | Admitting: Hematology and Oncology

## 2021-03-26 ENCOUNTER — Ambulatory Visit (INDEPENDENT_AMBULATORY_CARE_PROVIDER_SITE_OTHER): Payer: Medicare Other | Admitting: Sports Medicine

## 2021-03-26 ENCOUNTER — Encounter: Payer: Self-pay | Admitting: Sports Medicine

## 2021-03-26 DIAGNOSIS — B351 Tinea unguium: Secondary | ICD-10-CM

## 2021-03-26 DIAGNOSIS — E119 Type 2 diabetes mellitus without complications: Secondary | ICD-10-CM

## 2021-03-26 DIAGNOSIS — M79676 Pain in unspecified toe(s): Secondary | ICD-10-CM | POA: Diagnosis not present

## 2021-03-26 NOTE — Patient Instructions (Signed)
Dr. Nicoletta Dress, MD [facility]. Internal Medicine  [address] [place], [place] [address] Phone: [phone]  [facility] [address], [place], [place] [address] Phone: [phone]

## 2021-03-26 NOTE — Progress Notes (Signed)
°  Subjective: David Kelly is a 76 y.o. male patient with history of diabetes who returns to office today complaining of long, painful nails while ambulating in shoes; unable to trim. Denies any other pedal complaints.  Fasting blood sugar 117  PCP Biagio Borg, MD last visit less than a year ago needs a new doctor    Patient reports he is finished all of his radiation treatment denies any other current complaints at this time.  Patient Active Problem List   Diagnosis Date Noted   Dehydration 02/10/2021   Weight loss 02/10/2021   Postoperative examination 12/09/2020   Deficiency anemia 12/03/2020    Class: Chronic   Loss of teeth due to extraction 12/01/2020   Retained tooth root 12/01/2020   Preventive measure    Oropharyngeal cancer ([place]) 10/22/2020   Metastasis to head and neck lymph node ([facility]) 10/22/2020   Neck mass 09/10/2020   Vitamin D deficiency 04/30/2020   B12 deficiency 04/29/2020   Abscess of finger 09/06/2018   MI (myocardial infarction) ([place]) 12/11/2017   History of left knee replacement 12/11/2017   HLD (hyperlipidemia) 09/28/2017   Chronic cough 03/31/2017   Primary osteoarthritis of right knee 03/15/2017   Unspecified injury of lower back, sequela 03/15/2017   Bilateral rotator cuff syndrome 03/15/2017   Recurrent falls while walking 12/13/2016   Chronic pain 12/13/2016   Kidney stones    Glaucoma    Gastrointestinal bleed 06/28/2016   Dysuria 10/28/2015   Diabetes mellitus ([place]) 07/15/2015   Osteoarthritis 07/15/2015    No Known Allergies    Objective: General: Patient is awake, alert, and oriented x 3 and in no acute distress.  Integument: Skin is warm, dry and supple bilateral. Nails are tender, mildly elongated, thickened and dystrophic with subungual debris, consistent with onychomycosis, 1-5 bilateral. No signs of infection.  No open lesions remaining integument unremarkable.  Neurovascular status intact bilateral  Musculoskeletal:  Asymptomatic bunion and hammertoe and planus pedal deformities noted bilateral. Muscular strength 5/5 in all lower extremity muscular groups bilateral without pain on range of motion. No tenderness with calf compression bilateral.  Assessment and Plan: Problem List Items Addressed This Visit   None Visit Diagnoses     Pain due to onychomycosis of toenail    -  Primary   Diabetes mellitus without complication ([place])          -Examined patient. -Encouraged the importance of daily foot inspection in the setting of diabetes -New PCP recommendations provided -Mechanically debrided all nails 1-5 bilateral using sterile nail nipper and filed with dremel without incident -Patient to return  in 3 months for at risk foot care or sooner if there are any problems or issues  Landis Martins, DPM

## 2021-04-02 ENCOUNTER — Other Ambulatory Visit: Payer: Self-pay

## 2021-04-02 ENCOUNTER — Encounter (HOSPITAL_COMMUNITY): Payer: Self-pay | Admitting: Dentistry

## 2021-04-02 ENCOUNTER — Ambulatory Visit (INDEPENDENT_AMBULATORY_CARE_PROVIDER_SITE_OTHER): Payer: Medicaid Other | Admitting: Dentistry

## 2021-04-02 VITALS — BP 117/76 | HR 78 | Temp 98.3°F | Wt 160.0 lb

## 2021-04-02 DIAGNOSIS — R432 Parageusia: Secondary | ICD-10-CM | POA: Diagnosis not present

## 2021-04-02 DIAGNOSIS — R1310 Dysphagia, unspecified: Secondary | ICD-10-CM

## 2021-04-02 DIAGNOSIS — K036 Deposits [accretions] on teeth: Secondary | ICD-10-CM

## 2021-04-02 DIAGNOSIS — K117 Disturbances of salivary secretion: Secondary | ICD-10-CM

## 2021-04-02 DIAGNOSIS — R634 Abnormal weight loss: Secondary | ICD-10-CM

## 2021-04-02 DIAGNOSIS — Y842 Radiological procedure and radiotherapy as the cause of abnormal reaction of the patient, or of later complication, without mention of misadventure at the time of the procedure: Secondary | ICD-10-CM

## 2021-04-02 DIAGNOSIS — Z923 Personal history of irradiation: Secondary | ICD-10-CM | POA: Insufficient documentation

## 2021-04-02 NOTE — Progress Notes (Signed)
Department of Dental Medicine    Service Date:   04/02/2021  Patient Name:  David Kelly Date of Birth:   January 21, 1946 Medical Record Number: [id number]   LIMITED EXAM PLAN/RECOMMENDATIONS   ASSESSMENT: History of radiation therapy to the head and neck. Xerostomia, accretions on teeth, dysgeusia, odynophagia, dysphagia, weight loss  RECOMMENDATIONS: Brush after meals & at bedtime.  Floss at bedtime. Use CLoSYS for dry mouth & warm salt water/baking soda rinses as needed. Take multiple sips of water as needed.  PLAN: Return in ~4 weeks for comprehensive exam & C/P initial impressions.  Place OS referral at this time. Call if any questions or concerns arise before next visit.       04/02/2021    FOLLOW-UP NOTE:  HISTORY OF PRESENT ILLNESS: David Kelly is a 76 y.o. male who presents today for an oral examination after radiation therapy for pharyngeal cancer.   The patient has completed chemoradiation therapy at [facility].  Unable to find radiation oncology notes in Care Everywhere.  Will need to contact them for the patient's current medical records.   REVIEW OF CHIEF COMPLAINTS: DRY MOUTH:  Yes HARD TO SWALLOW:  Yes HURTS TO SWALLOW:  Yes TASTE CHANGES:  Yes, but uncertain to what extent since he is not eating by mouth. SORES IN MOUTH: No LIMITED OPENING:  No; the patient denies symptoms. WEIGHT LOSS: Yes, he is not currently eating anything by mouth. ~160 lbs down from 210 lbs   AT- HOME ORAL HYGIENE REGIMEN: BRUSHING:  No FLOSSING:  No RINSING:  Rinses with warm salt water and one other over-the-counter mouthwash that he cannot remember the name to. FLUORIDE:  He thinks the mouthwash he uses has fluoride in it. TRISMUS EXERCISES:  No; the patient is completely edentulous on maxilla.   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 ([place]) 10/22/2020   Neck mass 09/10/2020   Vitamin D deficiency 04/30/2020   B12 deficiency 04/29/2020   Abscess of finger 09/06/2018   MI (myocardial infarction) ([place]) 12/11/2017   History of left knee replacement 12/11/2017   HLD (hyperlipidemia) 09/28/2017   Chronic cough 03/31/2017   Primary osteoarthritis of right knee 03/15/2017   Unspecified injury of lower back, sequela 03/15/2017   Bilateral rotator cuff syndrome 03/15/2017   Recurrent falls while walking 12/13/2016   Chronic pain 12/13/2016   Kidney stones    Glaucoma    Gastrointestinal bleed 06/28/2016   Dysuria 10/28/2015   Diabetes mellitus ([place]) 07/15/2015   Osteoarthritis 07/15/2015   Past Medical History:  Diagnosis Date   Allergy    Arthritis    Blood transfusion without reported diagnosis    with bleeding colon per pt in the past    BPH (benign prostatic hypertrophy)    Cancer ([place])    head/neck   Diabetes mellitus    DX  5 YR AGO  Type II   Glaucoma    H/O: GI bleed    History of kidney stones    HLD (hyperlipidemia) 09/28/2017   Myocardial infarction ([place])    in the 1990's   Past Surgical History:  Procedure Laterality  Date   COLONOSCOPY     CYSTOSCOPY WITH RETROGRADE PYELOGRAM, URETEROSCOPY AND STENT PLACEMENT Right 05/20/2016   Procedure: CYSTOSCOPY, URETEROSCOPY, TRANSURETHRAL INCISION OF URETHROCELE, BASKET STONE REMOVAL;  Surgeon: Irine Seal, MD;  Location: [facility];  Service: Urology;  Laterality: Right;   Gall Bladder STones removed     left total knee replacement     MULTIPLE EXTRACTIONS WITH ALVEOLOPLASTY N/A 11/20/2020   Procedure: MULTIPLE EXTRACTION WITH ALVEOLOPLASTY;  Surgeon: [name], DMD;  Location: [facility];  Service: Dentistry;  Laterality: N/A;   TOTAL KNEE  ARTHROPLASTY  04/04/2011   Procedure: TOTAL KNEE ARTHROPLASTY;  Surgeon: Rudean Haskell, MD;  Location: [facility];  Service: Orthopedics;  Laterality: Left;   Current Outpatient Medications  Medication Sig Dispense Refill   Blood Glucose Monitoring Suppl (ACCU-CHEK GUIDE ME) w/Device KIT Use as directed twice per day E11.9 (Patient not taking: Reported on 02/17/2021) 1 kit 0   brimonidine-timolol (COMBIGAN) 0.2-0.5 % ophthalmic solution Place 1 drop into both eyes every 12 (twelve) hours. 15 mL 1   diclofenac sodium (VOLTAREN) 1 % GEL Apply 1 application topically 3 (three) times daily. Right knee and both shoulders. (Patient not taking: Reported on 02/17/2021) 3 Tube 4   ferrous sulfate 325 (65 FE) MG tablet Take 325 mg by mouth daily with breakfast. Informed patient to start taking iron supplement every day. 01/11/21 (Patient not taking: Reported on 02/17/2021)     fluconazole (DIFLUCAN) 100 MG tablet Take 100 mg by mouth daily. Take one tablet daily for 10 days. (Patient not taking: Reported on 02/17/2021)     Fluocinolone Acetonide 0.01 % OIL Place 5 drops in ear(s) 2 (two) times daily. (Patient not taking: Reported on 02/17/2021) 20 mL 1   fluticasone (FLONASE) 50 MCG/ACT nasal spray Place 2 sprays into both nostrils daily. (Patient not taking: Reported on 02/17/2021) 16 g 6   glucose blood (ACCU-CHEK GUIDE) test strip Use as instructed twice per day E11.9 (Patient not taking: Reported on 02/17/2021) 200 each 12   HYDROcodone-acetaminophen (NORCO) 7.5-325 MG tablet Take 1 tablet by mouth every 4 (four) hours as needed for moderate pain. 120 tablet 0   Lancets MISC Use as directed twice per day E11.9 (Patient not taking: Reported on 02/17/2021) 200 each 12   levocetirizine (XYZAL) 5 MG tablet  (Patient not taking: Reported on 02/17/2021)     magic mouthwash (nystatin, lidocaine, diphenhydrAMINE, alum & mag hydroxide) suspension Swish and swallow 5 mLs 4 (four) times daily as needed for mouth pain. Swish and swallow  59m QID prn pain. (Patient not taking: Reported on 02/17/2021)     metFORMIN (GLUCOPHAGE) 1000 MG tablet TAKE 1 TABLET BY MOUTH 2 TIMES DAILY WITH A MEAL (Patient not taking: Reported on 02/17/2021) 180 tablet 3   montelukast (SINGULAIR) 10 MG tablet Take 1 tablet (10 mg total) by mouth at bedtime. (Patient not taking: Reported on 02/17/2021) 90 tablet 3   Multiple Vitamin (MULTIVITAMIN WITH MINERALS) TABS tablet Take 1 tablet by mouth daily. (Patient not taking: Reported on 02/17/2021)     naproxen (NAPROSYN) 500 MG tablet Take by mouth. (Patient not taking: Reported on 02/17/2021)     ondansetron (ZOFRAN) 8 MG tablet Take 1 tablet (8 mg total) by mouth 2 (two) times daily as needed. Start on the third day after cisplatin chemotherapy. (Patient not taking: Reported on 02/17/2021) 30 tablet 1   prochlorperazine (COMPAZINE) 10 MG tablet Take 1 tablet (10 mg total) by mouth every 6 (six) hours as  needed (Nausea or vomiting). (Patient not taking: Reported on 02/17/2021) 30 tablet 1   psyllium (METAMUCIL) 58.6 % packet Take 1 packet by mouth daily. (Patient not taking: Reported on 02/17/2021)     tamsulosin (FLOMAX) 0.4 MG CAPS capsule Take 1 capsule (0.4 mg total) by mouth at bedtime. (Patient not taking: Reported on 02/17/2021) 90 capsule 3   traMADol (ULTRAM) 50 MG tablet Take 1-2 tablets (50-100 mg total) by mouth every 6 (six) hours as needed. (Patient not taking: Reported on 02/17/2021) 120 tablet 0   triamcinolone ointment (KENALOG) 0.5 % APPLY TO AFFECTED AREA(S) TWICE DAILY (Patient not taking: Reported on 02/17/2021) 30 g 2   No current facility-administered medications for this visit.   No Known Allergies   VITALS: BP 117/76 (BP Location: Right Arm, Patient Position: Sitting, Cuff Size: Normal)    Pulse 78    Temp 98.3 F (36.8 C) (Oral)    DENTAL EXAM: ORAL HYGIENE (PLAQUE):  Generalized plaque and calculus accumulation MUCOSITIS:  No DESCRIPTION OF SALIVA:  Slightly thick and viscous saliva EXPOSED  BONE:  No OTHER WATCHED AREAS:  Extraction site in upper left quadrant (#16); monitoring retained root.  Soft tissue has covered the site and has healed well; plan to refer to oral surgeon for complete removal of root and alveoloplasty due to undercut that would interfere with complete denture fabrication.  Will wait until next visit to give the patient more time to recover following radiation.   ASSESSMENT/DIAGNOSES:  XEROSTOMIA:   He does notice symptoms of dry mouth.  CLoSYS samples with mouthrinse, toothpaste and mouth spray were given to the patient to try out today to help alleviate symptoms.  DYSGEUSIA:   He is not eating or drinking anything by mouth still, so he is unsure how much taste he has lost or how much has returned.  He did say that when he rinses with warm salt water he cannot taste the salt.  DYSPHAGIA:  He does have difficulty swallowing still.   ODYNOPHAGIA:  He does have pain on swallowing. WEIGHT LOSS:  He is not sure how much he weighs right now, but he has lost a significant amount of weight since starting radiation. ACCRETIONS:  He is not currently brushing his teeth.  Encouraged the patient to start brushing and flossing daily to prevent caries from developing or progressing on his remaining dentition.  Soft toothbrush, toothpaste and floss were given to the patient today.    PLAN AND RECOMMENDATIONS: Brush after meals and at bedtime.  Use fluoride at bedtime and floss at bedtime. Use CLoSYS and salt water/baking soda rinses as needed. Take multiple sips of water as needed.  Return for comprehensive exam/treatment planning and upper complete/lower partial denture initial impressions.  Scheduled in about 2-4 weeks. Call if any problems or concerns arise before next visit.  All questions and concerns were invited and addressed.  The patient tolerated today's visit well and departed in stable condition.   [name], D.M.D.

## 2021-04-02 NOTE — Patient Instructions (Signed)
[facility] Department of Dental Medicine Dr. Ronin Crager B. Larenz Frasier, D.M.D. Phone: [phone] Fax: [phone]      It was a pleasure seeing you today!  Please refer to the information below regarding your dental visit with us.  Please do not hesitate to give us a call if any questions or concerns come up after you leave.     Thank you for letting us provide care for you.  If there is anything we can do for you, please let us know.    RECOMMENDATIONS: Brush after meals and at bedtime.  Floss once a day, and use fluoride at bedtime. Use trismus exercises as directed below. Use CLOSYs for dry mouth, and salt water/baking soda rinses to help with any mouth sores. Take multiple sips of water as needed.  Stay hydrated. Return to your regular dentist for routine dental care including cleanings and periodic exams.  If you do not have a regular dentist, it is important to establish care at an outside dental office of your choice.  Call us if any problems or concerns arise.    TRISMUS: Trismus is a condition where the jaw does not allow the mouth to open as wide as it usually does.  This can happen almost suddenly, or in other cases the process is so slow, it is hard to notice it-until it is too far along.  When the jaw joints and/or muscles have been exposed to radiation treatments, the onset of trismus is very slow.  This is because the muscles are losing their stretching ability over a long period of time, as long as 2 YEARS after the end of radiation.  It is therefore important to exercise these muscles and joints.  Trismus exercises: Use the stack of tongue depressors measuring the same or a little less than your maximum opening that was recorded at your first dental visit. Place the stack in your mouth, supporting the other end with your hand. Allow 30 seconds for muscle stretching. Rest for a few seconds. Repeat 3-5 times. For all radiation patients,  this exercise is recommended in the mornings and evenings unless otherwise instructed. The exercises should be done for a period of 2 YEARS after the end of radiation. Maximum jaw opening should be checked routinely on recall dental visits by your general dentist. Report any changes, soreness, or difficulties encountered when doing the exercises to your dentist.    Questions?  Call our office during office hours at [phone].  

## 2021-04-06 NOTE — Progress Notes (Signed)
[facility]  [address] [place],  [place]  [address] [phone]  Clinic Day:  04/12/2021  Referring physician: Biagio Borg, MD  This document serves as a record of services personally performed by [name], MD. It was created on their behalf by Curry,Lauren E, a trained medical scribe. The creation of this record is based on the scribe's personal observations and the provider's statements to them.  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  He has a mass involving the right lateral pharyngeal wall inferior to the tonsillar region and extending to the midline of the vallecular. He completed concurrent chemoradiation with weekly cisplatin in December. He tolerated  fairly well, but he has issues with malnutrition and weight loss. He still is unable to swallow food. Scan shows improvement but still accentuated enhancement in the right vallecula and so he will need naso-pharyngoscopy.  Solitary hypermetabolic metastatic lymph node in the right level 2 nodal region, measuring 3.2 cm. This is resolved on CT imaging from February 2023.  Anemia, stable. He does have a history of B12 deficiency. Iron studies are consistent with iron deficiency and we recommend iron supplement daily. If he does not show improvement in his anemia, we will need to evaluate his compliance with the iron supplement.  Weight loss. He is managing tube feedings with 4-5 Ensure daily and advised that he increase this to 6 if he is unable to have oral intake. I encouraged him to try oral feedings as tolerated with soft foods.  Oral candidiasis. I will place him on Diflucan 100 mg daily for 10 days.   He completed concurrent chemoradiaton with weekly cisplatin in December. CT neck has revealed accentuated enhancement at site of presumed primary tumor in the right vallecula, but no asymmetric  thickening, submucosal tumor or residual adenopathy. He needs to have nasopharyngoscopy. We will refer him back to Melida Quitter, ENT, for further evaluation and recommendations regarding his swallowing. I will send in Robitussin AC 5 cc Q6H prn for cough, Diflucan 100 mg daily for 10 days for oral candidiasis, as well as refill hydrocodone 7.5/325 for pain.  I advised that he increase the tube feedings to 6 Ensure daily, and he may try a soft food diet. Otherwise, we will plan to see him back in 3-4 weeks with CBC and CMP for repeat evaluation. He understands and agrees with this plan of care. I have answered his questions and he knows to call with any concerns.  I provided 20 minutes of face-to-face time during this this encounter and > 50% was spent counseling as documented under my assessment and plan.    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 This began in July when the patient was able to palpate swelling of the right side of his neck. This area was not painful and he denied any dysphagia. He followed up with Dr. Melida Quitter of otolaryngology on July 28th who performed FNA. Pathology from this procedure revealed rare atypical cells of  undermined significance. Repeat FNA was performed on August 16th and pathology revealed acute and chronic inflammation, with no malignant cell identified. Eventually, right neck lymph node biopsy was obtained on September 6th and final pathology confirmed metastatic squamous cell carcinoma associated with scanty lymphoid tissue.  Laryngoscopy performed on September  8th revealed a mass involving the right lateral pharyngeal wall inferior to the tonsillar region and extending to the midline of the vallecular.  NM PET was performed on September 28th, revealing increased radiotracer uptake in the patient's right hypopharynx. A solitary hypermetabolic metastatic lymph node in the right level 2 nodal region was also observed, measuring 3.2 cm. No distant metastatic disease was seen. He was seen in consultation by Dr. Isidore Moos and Dr. Alvy Bimler and concurrent chemoradiation with weekly cisplatin was recommended. As the patient lives locally, he would like treatment to be carried out at our facility. He uses a cane to ambulate, and had a knee replacement 10 years ago.   INTERVAL HISTORY:  I have reviewed his chart and materials related to his cancer extensively and collaborated history with the patient. Summary of oncologic history is as follows: Oncology History Overview Note  Immunohistochemistry for p16 is strongly positive.    Oropharyngeal cancer ([facility])  09/10/2020 Initial Diagnosis   He presented with a palpable knot in his upper right neck to Dr. Redmond Baseman on 09/10/20. Patient denied any pain at that time, though did report some difficulty swallowing.     10/20/2020 Pathology Results   FINAL MICROSCOPIC DIAGNOSIS:   A. LYMPH NODE, RIGHT NECK, NEEDLE CORE BIOPSY:  - Metastatic squamous cell carcinoma associated with scanty lymphoid tissue.   10/20/2020 Procedure   Technically successful ultrasound guided biopsy of indeterminate right neck mass/nodal conglomeration.   10/22/2020 Procedure   The fiberoptic laryngoscope was then placed through the nasal passage to view the pharynx and larynx. After completion, the telescope was removed. Findings included normal nasal passages and no mass or abnormality in the nasopharynx. There is a granular mass involving the right lateral pharyngeal wall inferior to tonsillar area and extending to the midline of the vallecula. The larynx  appears normal. Pyriform sinuses are open. Secretions are minimal. Vocal folds are without mass, scarring, or ulceration. The vocal folds adduct and abduct symmetrically. There is good glottal closure. Muscle tension patterns are not present. Laryngeal edema is minimal.    11/11/2020 PET scan   1. Hypermetabolic mass in the RIGHT hypopharynx localizing to the RIGHT tonsillar pillar and vallecula. 2. Solitary hypermetabolic metastatic lymph node to the RIGHT level II nodal station. 3. No distant metastatic disease.   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 He completed chemoradiation in December. CT neck from February 24th revealed accentuated enhancement at site of presumed primary tumor in the right vallecula, but no asymmetric thickening, need correlation with mucosal exam. No submucosal tumor. No residual adenopathy. We will plan to refer him back to Melida Quitter, ENT, for further evaluation with scoping. He continues to have sore throat and has had cough which is painful. I will  send in Robitussin AC 5 cc Q6H prn for cough. He also reports pain of the right leg and arm which he rates as a 9/10 today. He continues to use hydrocodone 7.5/325 as needed for pain and needs a refill. He has still not tried to eat orally, due to severe soreness with swallowing. He has been supplementing with tube feedings with Ensure, 4-5 daily. Blood counts and chemistries are unremarkable except for a stable hemoglobin of 10.1. His  appetite is poor, and he has lost 4 and 1/2 pounds since  his last visit.  He denies fever, chills or other signs of infection.  He denies nausea, vomiting, bowel issues, or abdominal pain.  He denies dyspnea, or chest pain.  HISTORY:   Allergies: No Known Allergies  Current Medications: Current Outpatient Medications  Medication Sig Dispense Refill   ferrous sulfate 325 (65 FE) MG tablet Take 325 mg by mouth daily with breakfast. Informed patient to start taking iron supplement every day. 01/11/21     psyllium (METAMUCIL) 58.6 % packet Take 1 packet by mouth daily.     Blood Glucose Monitoring Suppl (ACCU-CHEK GUIDE ME) w/Device KIT Use as directed twice per day E11.9 (Patient not taking: Reported on 02/17/2021) 1 kit 0   brimonidine-timolol (COMBIGAN) 0.2-0.5 % ophthalmic solution Place 1 drop into both eyes every 12 (twelve) hours. 15 mL 1   diclofenac sodium (VOLTAREN) 1 % GEL Apply 1 application topically 3 (three) times daily. Right knee and both shoulders. (Patient not taking: Reported on 02/17/2021) 3 Tube 4   fluconazole (DIFLUCAN) 100 MG tablet Take 100 mg by mouth daily. Take one tablet daily for 10 days. (Patient not taking: Reported on 02/17/2021)     Fluocinolone Acetonide 0.01 % OIL Place 5 drops in ear(s) 2 (two) times daily. (Patient not taking: Reported on 02/17/2021) 20 mL 1   fluticasone (FLONASE) 50 MCG/ACT nasal spray Place 2 sprays into both nostrils daily. (Patient not taking: Reported on 02/17/2021) 16 g 6   glucose blood (ACCU-CHEK GUIDE) test strip Use as instructed twice per day E11.9 (Patient not taking: Reported on 02/17/2021) 200 each 12   HYDROcodone-acetaminophen (NORCO) 7.5-325 MG tablet Take 1 tablet by mouth every 4 (four) hours as needed for moderate pain. 120 tablet 0   Lancets MISC Use as directed twice per day E11.9 (Patient not taking: Reported on 02/17/2021) 200 each 12   levocetirizine (XYZAL) 5 MG tablet  (Patient not taking: Reported on 02/17/2021)     magic mouthwash (nystatin, lidocaine, diphenhydrAMINE, alum & mag  hydroxide) suspension Swish and swallow 5 mLs 4 (four) times daily as needed for mouth pain. Swish and swallow 48m QID prn pain. (Patient not taking: Reported on 02/17/2021)     metFORMIN (GLUCOPHAGE) 1000 MG tablet TAKE 1 TABLET BY MOUTH 2 TIMES DAILY WITH A MEAL (Patient not taking: Reported on 02/17/2021) 180 tablet 3   montelukast (SINGULAIR) 10 MG tablet Take 1 tablet (10 mg total) by mouth at bedtime. (Patient not taking: Reported on 02/17/2021) 90 tablet 3   Multiple Vitamin (MULTIVITAMIN WITH MINERALS) TABS tablet Take 1 tablet by mouth daily. (Patient not taking: Reported on 02/17/2021)     naproxen (NAPROSYN) 500 MG tablet Take by mouth. (Patient not taking: Reported on 02/17/2021)     ondansetron (ZOFRAN) 8 MG tablet Take 1 tablet (8 mg total) by mouth 2 (two) times daily as needed. Start on the third day after cisplatin chemotherapy. (Patient not taking: Reported on 02/17/2021) 30 tablet 1  prochlorperazine (COMPAZINE) 10 MG tablet Take 1 tablet (10 mg total) by mouth every 6 (six) hours as needed (Nausea or vomiting). (Patient not taking: Reported on 02/17/2021) 30 tablet 1   tamsulosin (FLOMAX) 0.4 MG CAPS capsule Take 1 capsule (0.4 mg total) by mouth at bedtime. (Patient not taking: Reported on 02/17/2021) 90 capsule 3   traMADol (ULTRAM) 50 MG tablet Take 1-2 tablets (50-100 mg total) by mouth every 6 (six) hours as needed. (Patient not taking: Reported on 02/17/2021) 120 tablet 0   triamcinolone ointment (KENALOG) 0.5 % APPLY TO AFFECTED AREA(S) TWICE DAILY (Patient not taking: Reported on 02/17/2021) 30 g 2   No current facility-administered medications for this visit.    REVIEW OF SYSTEMS:  Review of Systems  Constitutional:  Negative for appetite change, chills, fatigue, fever and unexpected weight change.  HENT:   Positive for sore throat.   Eyes: Negative.   Respiratory:  Positive for cough. Negative for chest tightness, hemoptysis, shortness of breath and wheezing.   Cardiovascular:  Negative.  Negative for chest pain, leg swelling and palpitations.  Gastrointestinal: Negative.  Negative for abdominal distention, abdominal pain, blood in stool, constipation, diarrhea, nausea and vomiting.  Endocrine: Negative.   Genitourinary: Negative.  Negative for difficulty urinating, dysuria, frequency and hematuria.   Musculoskeletal:  Positive for arthralgias (of the right knee, chronic). Negative for back pain, flank pain, gait problem and myalgias.  Skin: Negative.   Neurological: Negative.  Negative for dizziness, extremity weakness, gait problem, headaches, light-headedness, numbness, seizures and speech difficulty.  Hematological: Negative.   Psychiatric/Behavioral: Negative.  Negative for depression and sleep disturbance. The patient is not nervous/anxious.   All other systems reviewed and are negative.  Inability to swallow VITALS:  Blood pressure 117/64, pulse 97, temperature 98.3 F (36.8 C), temperature source Oral, resp. rate 18, height 6' (1.829 m), weight 157 lb 8 oz (71.4 kg), SpO2 99 %.  Wt Readings from Last 3 Encounters:  04/12/21 157 lb 8 oz (71.4 kg)  04/02/21 160 lb (72.6 kg)  03/15/21 161 lb 8 oz (73.3 kg)    Body mass index is 21.36 kg/m.  Performance status (ECOG): 1 - Symptomatic but completely ambulatory  PHYSICAL EXAM:  Physical Exam Constitutional:      General: He is not in acute distress.    Appearance: Normal appearance. He is normal weight.  HENT:     Head: Normocephalic and atraumatic.     Mouth/Throat:     Comments: Tongue is very coated.  Eyes:     General: No scleral icterus.    Extraocular Movements: Extraocular movements intact.     Conjunctiva/sclera: Conjunctivae normal.     Pupils: Pupils are equal, round, and reactive to light.  Cardiovascular:     Rate and Rhythm: Regular rhythm.     Pulses: Normal pulses.     Heart sounds: Normal heart sounds. No murmur heard.   No friction rub. No gallop.  Pulmonary:     Effort:  Pulmonary effort is normal. No respiratory distress.     Breath sounds: Normal breath sounds.  Abdominal:     General: Bowel sounds are normal. There is no distension.     Palpations: Abdomen is soft. There is no hepatomegaly, splenomegaly or mass.     Tenderness: There is no abdominal tenderness.  Musculoskeletal:        General: Normal range of motion.     Cervical back: Normal range of motion and neck supple.     Right  lower leg: No edema.     Left lower leg: No edema.  Lymphadenopathy:     Cervical: No cervical adenopathy.  Skin:    General: Skin is warm and dry.  Neurological:     General: No focal deficit present.     Mental Status: He is alert and oriented to person, place, and time. Mental status is at baseline.  Psychiatric:        Mood and Affect: Mood normal.        Behavior: Behavior normal.        Thought Content: Thought content normal.        Judgment: Judgment normal.   severe LABS:   CBC Latest Ref Rng & Units 04/12/2021 03/15/2021 03/03/2021  WBC - 5.2 6.3 5.6  Hemoglobin 13.5 - 17.5 10.1(A) 10.1(A) 9.9(A)  Hematocrit 41 - 53 32(A) 29(A) 31(A)  Platelets 150 - 399 281 314 310   CMP Latest Ref Rng & Units 04/12/2021 03/15/2021 03/03/2021  Glucose 70 - 99 mg/dL - - -  BUN 4 - 21 17 17 13   Creatinine 0.6 - 1.3 0.7 0.8 0.9  Sodium 137 - 147 140 137 136(A)  Potassium 3.4 - 5.3 4.5 3.9 3.7  Chloride 99 - 108 101 102 100  CO2 13 - 22 30(A) 28(A) 23(A)  Calcium 8.7 - 10.7 9.4 9.5 9.4  Total Protein 6.0 - 8.3 g/dL - - -  Total Bilirubin 0.2 - 1.2 mg/dL - - -  Alkaline Phos 25 - 125 49 54 61  AST 14 - 40 24 44(A) 49(A)  ALT 10 - 40 13 18 23     Lab Results  Component Value Date   TIBC 255 02/10/2021   TIBC 462 (H) 12/03/2020   FERRITIN 485 (H) 02/10/2021   FERRITIN 60 12/03/2020   IRONPCTSAT 22 02/10/2021   IRONPCTSAT 16 (L) 12/03/2020   IRONPCTSAT 22.8 03/11/2019   No results found for: LDH  STUDIES:  No results found.    EXAM: 04/09/2021 CT NECK  WITH CONTRAST   TECHNIQUE:  Multidetector CT imaging of the neck was performed using the  standard protocol following the bolus administration of intravenous  contrast.   RADIATION DOSE REDUCTION: This exam was performed according to the  departmental dose-optimization program which includes automated  exposure control, adjustment of the mA and/or kV according to  patient size and/or use of iterative reconstruction technique.   CONTRAST: 100 cc Isovue 370 intravenous   COMPARISON: 07/13/2020  FINDINGS:  Pharynx and larynx: Presumed radiotherapy with diffuse submucosal  low-density expansion specially at the level of the larynx.  Accentuated enhancement at the right vallecula but no residual  masslike thickening. The left piriform sinus also appears negative.  Salivary glands: Stranding around the right more than left  submandibular glands, presumably post treatment. No calculi or  masslike finding  Thyroid: Normal.  Lymph nodes: None enlarged or abnormal density.  Vascular: Atheromatous plaque.  Limited intracranial: Negative  Visualized orbits: Negative  Mastoids and visualized paranasal sinuses: Clear  Skeleton: No indication of metastatic disease. Ordinary cervical  spine degeneration.  Upper chest: Emphysema.   IMPRESSION:  1. Accentuated enhancement at site of presumed primary tumor in the  right vallecula, but no asymmetric thickening, need correlation with  mucosal exam. No submucosal tumor.  2. No residual adenopathy      I, Rita Ohara, am acting as scribe for David Kaplan, MD  I have reviewed this report as typed by the medical scribe, and it  is complete and accurate.

## 2021-04-12 ENCOUNTER — Inpatient Hospital Stay (INDEPENDENT_AMBULATORY_CARE_PROVIDER_SITE_OTHER): Payer: Medicare Other | Admitting: Oncology

## 2021-04-12 ENCOUNTER — Encounter: Payer: Self-pay | Admitting: Oncology

## 2021-04-12 ENCOUNTER — Other Ambulatory Visit: Payer: Self-pay | Admitting: Hematology and Oncology

## 2021-04-12 ENCOUNTER — Other Ambulatory Visit: Payer: Self-pay

## 2021-04-12 ENCOUNTER — Other Ambulatory Visit: Payer: Self-pay | Admitting: Oncology

## 2021-04-12 VITALS — BP 117/64 | HR 97 | Temp 98.3°F | Resp 18 | Ht 72.0 in | Wt 157.5 lb

## 2021-04-12 DIAGNOSIS — C109 Malignant neoplasm of oropharynx, unspecified: Secondary | ICD-10-CM | POA: Diagnosis not present

## 2021-04-12 DIAGNOSIS — C770 Secondary and unspecified malignant neoplasm of lymph nodes of head, face and neck: Secondary | ICD-10-CM

## 2021-04-12 LAB — BASIC METABOLIC PANEL
BUN: 17 (ref 4–21)
CO2: 30 — AB (ref 13–22)
Chloride: 101 (ref 99–108)
Creatinine: 0.7 (ref 0.6–1.3)
Glucose: 108
Potassium: 4.5 (ref 3.4–5.3)
Sodium: 140 (ref 137–147)

## 2021-04-12 LAB — HEPATIC FUNCTION PANEL
ALT: 13 (ref 10–40)
AST: 24 (ref 14–40)
Alkaline Phosphatase: 49 (ref 25–125)
Bilirubin, Total: 0.4

## 2021-04-12 LAB — COMPREHENSIVE METABOLIC PANEL
Albumin: 4.2 (ref 3.5–5.0)
Calcium: 9.4 (ref 8.7–10.7)

## 2021-04-12 LAB — CBC: RBC: 3.64 — AB (ref 3.87–5.11)

## 2021-04-12 LAB — CBC AND DIFFERENTIAL
HCT: 32 — AB (ref 41–53)
Hemoglobin: 10.1 — AB (ref 13.5–17.5)
Neutrophils Absolute: 3.64
Platelets: 281 (ref 150–399)
WBC: 5.2

## 2021-04-12 MED ORDER — FLUCONAZOLE 100 MG PO TABS: 100.0000 mg | ORAL_TABLET | Freq: Every day | ORAL | 0 refills | Status: DC

## 2021-04-12 MED ORDER — HYDROCODONE-ACETAMINOPHEN 7.5-325 MG PO TABS: 1.0000 | ORAL_TABLET | ORAL | 0 refills | Status: DC | PRN

## 2021-04-12 MED ORDER — GUAIFENESIN-CODEINE 100-10 MG/5ML PO SOLN
5.0000 mL | Freq: Four times a day (QID) | ORAL | 0 refills | Status: DC | PRN
Start: 2021-04-12 — End: 2021-06-02

## 2021-04-13 ENCOUNTER — Telehealth: Payer: Self-pay | Admitting: Dietician

## 2021-04-13 ENCOUNTER — Other Ambulatory Visit: Payer: Self-pay

## 2021-04-13 ENCOUNTER — Encounter: Payer: Self-pay | Admitting: Oncology

## 2021-04-13 DIAGNOSIS — C770 Secondary and unspecified malignant neoplasm of lymph nodes of head, face and neck: Secondary | ICD-10-CM

## 2021-04-13 DIAGNOSIS — C109 Malignant neoplasm of oropharynx, unspecified: Secondary | ICD-10-CM

## 2021-04-13 NOTE — Telephone Encounter (Signed)
Nutrition Follow-up:  Called patient on mobile telephone.   Assessment:  He is still not taking anything by mouth.  He picked up diflucan but hasn't take it yet.   He still only doing 4-5 feeds most days he reports using Ensure Complete that he says he's buying at [facility]. He hasn't tried to take any thing by mouth yet. He has a very small amount of MMW. Encouraged him to use that prior to attempting PO pudding or applesauce to take meds.  Estimated intake per recall: 1400 kcal, 120 g protein, 1200 ml H2O  Medications: He relayed that he is currently not taking any medications.  Labs: 04/12/21 Hgb 10.1  Anthropometrics:   Weight loss continues 2.5# past 10 days,  Weight History: 04/12/21        157.5# 04/02/21        160# 03/11/21        159# 03/03/21   162.6# 02/01/21 177.0# 01/18/21 184#   Estimated Energy Needs  Kcals: 4034-7425 Protein: 77-92 grams Fluid: >/= 2300  NUTRITION DIAGNOSIS: Inadequate enteral intake overall related to dysphagia AEB continued weight loss and usual recall of enteral feeds. Continues with inadequate enteral intake and fear of attempting PO feeds.   MALNUTRITION DIAGNOSIS: suspect severe malnutrition of chronic disease, unable to perform NFPE   INTERVENTION: Encourage switching to Ensure Plus and taking 6 bottles a day to provide goal calories without excessive protein intake. Recommended he get coupons from [facility] [place]. Encourage baking soda swishes for oral hygiene. Encouraged weight stability with motivation for improved nutrition for better outcomes for future knee replacement he desires.  Goal Ensure Plus  240 ml 6 times per day with 180 FWF to provide:  2100 kcal, 96 g protein, 2160 ml (1080 ml H2O from formula + 1080 ml FWF) & 100% DRI for vitamins and minerals  I provided cell phone for any concerns or if he needs supplies.   MONITORING, EVALUATION, GOAL: TF tolerance and intake, progression of PO feeds, weight, labs   Next Visit:  Telephone consult 3/23  April Manson, RDN, LDN Registered Dietitian, [place] Part Time Remote (Usual office hours: Tuesday-Thursday) Cell: [phone]

## 2021-04-16 ENCOUNTER — Telehealth (HOSPITAL_COMMUNITY): Payer: Self-pay

## 2021-04-19 ENCOUNTER — Encounter: Payer: Self-pay | Admitting: Hematology and Oncology

## 2021-04-19 ENCOUNTER — Encounter: Payer: Self-pay | Admitting: Oncology

## 2021-04-19 ENCOUNTER — Telehealth (HOSPITAL_COMMUNITY): Payer: Self-pay

## 2021-04-19 NOTE — Telephone Encounter (Signed)
I called patient to discuss the need to cancel or reschedule this appt due to Dr. Benson Norway being out on a Medical Leave of Absence. I explained to patient that Dr. Benson Norway wanted him to have a consult while she was out with a local Oral Surgeon, Dr. Frederik Schmidt to discuss treatment that needed to be done prior to her taking impressions to make him a new upper and lower partial denture. Patient stated that it was ok to send the referral to his office and that he was "dissappointed, because he needs teeth." I explained to patient that after he sees Dr. Frederik Schmidt we could reschedule his Impression appt. Patient verbalized understanding and appt was canceled for 04-30-21. I also received verbal consent to send patients referral letter, records, and x-rays to Dr. Adair Patter office.  ?

## 2021-04-30 ENCOUNTER — Encounter (HOSPITAL_COMMUNITY): Payer: Medicare Other | Admitting: Dentistry

## 2021-05-05 ENCOUNTER — Encounter: Payer: Self-pay | Admitting: Hematology and Oncology

## 2021-05-05 ENCOUNTER — Inpatient Hospital Stay: Payer: Medicare Other

## 2021-05-05 ENCOUNTER — Inpatient Hospital Stay: Payer: Medicare Other | Attending: Hematology and Oncology | Admitting: Hematology and Oncology

## 2021-05-05 ENCOUNTER — Telehealth: Payer: Self-pay | Admitting: Hematology and Oncology

## 2021-05-05 ENCOUNTER — Other Ambulatory Visit: Payer: Self-pay

## 2021-05-05 DIAGNOSIS — E119 Type 2 diabetes mellitus without complications: Secondary | ICD-10-CM | POA: Diagnosis not present

## 2021-05-05 DIAGNOSIS — C770 Secondary and unspecified malignant neoplasm of lymph nodes of head, face and neck: Secondary | ICD-10-CM

## 2021-05-05 DIAGNOSIS — C109 Malignant neoplasm of oropharynx, unspecified: Secondary | ICD-10-CM

## 2021-05-05 LAB — CBC: RBC: 4 (ref 3.87–5.11)

## 2021-05-05 LAB — BASIC METABOLIC PANEL
BUN: 14 (ref 4–21)
CO2: 27 — AB (ref 13–22)
Chloride: 102 (ref 99–108)
Creatinine: 0.8 (ref 0.6–1.3)
Glucose: 109
Potassium: 3.7 mEq/L (ref 3.5–5.1)
Sodium: 137 (ref 137–147)

## 2021-05-05 LAB — COMPREHENSIVE METABOLIC PANEL
Albumin: 4.3 (ref 3.5–5.0)
Calcium: 9.8 (ref 8.7–10.7)

## 2021-05-05 LAB — HEPATIC FUNCTION PANEL
ALT: 14 U/L (ref 10–40)
AST: 27 (ref 14–40)
Alkaline Phosphatase: 55 (ref 25–125)
Bilirubin, Total: 0.7

## 2021-05-05 LAB — CBC AND DIFFERENTIAL
HCT: 35 — AB (ref 41–53)
Hemoglobin: 11 — AB (ref 13.5–17.5)
Neutrophils Absolute: 3.65
Platelets: 249 10*3/uL (ref 150–400)
WBC: 5

## 2021-05-05 MED ORDER — METFORMIN HCL 1000 MG PO TABS: 1000.0000 mg | ORAL_TABLET | Freq: Two times a day (BID) | ORAL | 3 refills | Status: AC

## 2021-05-05 MED ORDER — MONTELUKAST SODIUM 10 MG PO TABS: 10.0000 mg | ORAL_TABLET | Freq: Every day | ORAL | 3 refills | Status: DC

## 2021-05-05 MED ORDER — ACCU-CHEK GUIDE VI STRP
ORAL_STRIP | 12 refills | Status: DC
Start: 2021-05-05 — End: 2022-04-21

## 2021-05-05 MED ORDER — HYDROCODONE-ACETAMINOPHEN 7.5-325 MG PO TABS: 1.0000 | ORAL_TABLET | ORAL | 0 refills | Status: DC | PRN

## 2021-05-05 MED ORDER — BRIMONIDINE TARTRATE-TIMOLOL 0.2-0.5 % OP SOLN: 1.0000 [drp] | Freq: Two times a day (BID) | OPHTHALMIC | 1 refills | Status: DC

## 2021-05-05 NOTE — Telephone Encounter (Signed)
Per 05/05/21 los next appt scheduled and confirmed with patient ?

## 2021-05-05 NOTE — Assessment & Plan Note (Signed)
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  He has a mass involving the right lateral pharyngeal wall inferior to the tonsillar region and extending to the midline of the vallecular. He completed concurrent chemoradiation with weekly cisplatin in December. He tolerated  fairly well, but he has issues with malnutrition and weight loss. Scan shows improvement but still accentuated enhancement in the right vallecula and so he will need naso-pharyngoscopy. He has not heard from ENT yet about appointment. We will follow up on that. He is able to eat soft foods now including applesauce, mashed potatoes, etc. His weight is stable today. We provided him with food from our pantry as well as Ensure. He will return to clinic in 4 weeks for repeat evaluation.  ?

## 2021-05-05 NOTE — Progress Notes (Signed)
?Patient Care Team: ?Biagio Borg, MD as PCP - General (Internal Medicine) ?Landis Martins, DPM as Consulting Physician (Podiatry) ?Irine Seal, MD as Attending Physician (Urology) ?Malmfelt, Stephani Police, RN as Oncology Nurse Navigator ?Melida Quitter, MD as Consulting Physician (Otolaryngology) ?Eppie Gibson, MD as Consulting Physician (Radiation Oncology) ?[name], MD as Consulting Physician (Oncology) ? ?Clinic Day:  05/05/2021 ? ?Referring physician: Biagio Borg, MD ? ?ASSESSMENT & PLAN:  ? 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  He has a mass involving the right lateral pharyngeal wall inferior to the tonsillar region and extending to the midline of the vallecular. He completed concurrent chemoradiation with weekly cisplatin in December. He tolerated  fairly well, but he has issues with malnutrition and weight loss. Scan shows improvement but still accentuated enhancement in the right vallecula and so he will need naso-pharyngoscopy. He has not heard from ENT yet about appointment. We will follow up on that. He is able to eat soft foods now including applesauce, mashed potatoes, etc. His weight is stable today. We provided him with food from our pantry as well as Ensure. He will return to clinic in 4 weeks for repeat evaluation.  ?  ? ?The patient understands the plans discussed today and is in agreement with them.  He knows to contact our office if he develops concerns prior to his next appointment. ? ? ? ?Melodye Ped, NP  ?[place] ?[address] ?[place]  [address] ?Dept: [phone] ?Dept Fax: [phone]  ? ?No orders of the defined types were placed in this encounter. ?  ? ? ?CHIEF COMPLAINT:  ?CC: [name] with history of oropharyngeal cancer here for 4 week evaluation  ? ?Current  Treatment:  Surveillance ? ?INTERVAL HISTORY:  ?David Kelly is here today for repeat clinical assessment. He denies fevers or chills. He denies pain. His appetite is good. His weight has been stable. ? ?I have reviewed the past medical history, past surgical history, social history and family history with the patient and they are unchanged from previous note. ? ?ALLERGIES:  has No Known Allergies. ? ?MEDICATIONS:  ?Current Outpatient Medications  ?Medication Sig Dispense Refill  ? amoxicillin (AMOXIL) 500 MG capsule Take 500 mg by mouth 3 (three) times daily.    ? Blood Glucose Monitoring Suppl (ACCU-CHEK GUIDE ME) w/Device KIT Use as directed twice per day E11.9 (Patient not taking: Reported on 02/17/2021) 1 kit 0  ? brimonidine-timolol (COMBIGAN) 0.2-0.5 % ophthalmic solution Place 1 drop into both eyes every 12 (twelve) hours. 15 mL 1  ? cephALEXin (KEFLEX) 500 MG capsule Take 500 mg by mouth 2 (two) times daily.    ? diclofenac sodium (VOLTAREN) 1 % GEL Apply 1 application topically 3 (three) times daily. Right knee and both shoulders. (Patient not taking: Reported on 02/17/2021) 3 Tube 4  ? ferrous sulfate 325 (65 FE) MG tablet Take 325 mg by mouth daily with breakfast. Informed patient to start taking iron supplement every day. 01/11/21    ? fluconazole (DIFLUCAN) 100 MG tablet Take 1 tablet (100 mg total) by mouth daily. (Patient not taking: Reported on 05/05/2021) 10 tablet 0  ? Fluocinolone Acetonide 0.01 % OIL Place 5 drops in ear(s) 2 (two) times daily. (Patient not taking: Reported on 02/17/2021) 20 mL 1  ? fluticasone (FLONASE) 50 MCG/ACT nasal spray Place 2  sprays into both nostrils daily. (Patient not taking: Reported on 02/17/2021) 16 g 6  ? glucose blood (ACCU-CHEK GUIDE) test strip Use as instructed twice per day E11.9 200 each 12  ? guaiFENesin-codeine 100-10 MG/5ML syrup Take 5 mLs by mouth every 6 (six) hours as needed for cough. 120 mL 0  ? HYDROcodone-acetaminophen (NORCO) 7.5-325 MG tablet Take 1 tablet  by mouth every 4 (four) hours as needed for moderate pain. 120 tablet 0  ? Lancets MISC Use as directed twice per day E11.9 (Patient not taking: Reported on 02/17/2021) 200 each 12  ? levocetirizine (XYZAL) 5 MG tablet  (Patient not taking: Reported on 02/17/2021)    ? magic mouthwash (nystatin, lidocaine, diphenhydrAMINE, alum & mag hydroxide) suspension Swish and swallow 5 mLs 4 (four) times daily as needed for mouth pain. Swish and swallow 79m QID prn pain. (Patient not taking: Reported on 02/17/2021)    ? metFORMIN (GLUCOPHAGE) 1000 MG tablet Take 1 tablet (1,000 mg total) by mouth 2 (two) times daily with a meal. 180 tablet 3  ? montelukast (SINGULAIR) 10 MG tablet Take 1 tablet (10 mg total) by mouth at bedtime. 90 tablet 3  ? Multiple Vitamin (MULTIVITAMIN WITH MINERALS) TABS tablet Take 1 tablet by mouth daily. (Patient not taking: Reported on 02/17/2021)    ? naproxen (NAPROSYN) 500 MG tablet Take by mouth. (Patient not taking: Reported on 02/17/2021)    ? ondansetron (ZOFRAN) 8 MG tablet Take 1 tablet (8 mg total) by mouth 2 (two) times daily as needed. Start on the third day after cisplatin chemotherapy. (Patient not taking: Reported on 02/17/2021) 30 tablet 1  ? prochlorperazine (COMPAZINE) 10 MG tablet Take 1 tablet (10 mg total) by mouth every 6 (six) hours as needed (Nausea or vomiting). (Patient not taking: Reported on 02/17/2021) 30 tablet 1  ? psyllium (METAMUCIL) 58.6 % packet Take 1 packet by mouth daily.    ? tamsulosin (FLOMAX) 0.4 MG CAPS capsule Take 1 capsule (0.4 mg total) by mouth at bedtime. (Patient not taking: Reported on 02/17/2021) 90 capsule 3  ? traMADol (ULTRAM) 50 MG tablet Take 1-2 tablets (50-100 mg total) by mouth every 6 (six) hours as needed. (Patient not taking: Reported on 02/17/2021) 120 tablet 0  ? triamcinolone ointment (KENALOG) 0.5 % APPLY TO AFFECTED AREA(S) TWICE DAILY (Patient not taking: Reported on 02/17/2021) 30 g 2  ? ?No current facility-administered medications for this visit.   ? ? ?HISTORY OF PRESENT ILLNESS:  ? ?Oncology History Overview Note  ?Immunohistochemistry for p16 is strongly positive.  ?  ?Oropharyngeal cancer ([place]  ?09/10/2020 Initial Diagnosis  ? He presented with a palpable knot in his upper right neck to Dr. BRedmond Basemanon 09/10/20. Patient denied any pain at that time, though did report some difficulty swallowing. ?  ?  ?10/20/2020 Pathology Results  ? FINAL MICROSCOPIC DIAGNOSIS:  ? ?A. LYMPH NODE, RIGHT NECK, NEEDLE CORE BIOPSY:  ?- Metastatic squamous cell carcinoma associated with scanty lymphoid tissue. ?  ?10/20/2020 Procedure  ? Technically successful ultrasound guided biopsy of indeterminate right neck mass/nodal conglomeration. ?  ?10/22/2020 Procedure  ? The fiberoptic laryngoscope was then placed through the nasal passage to view the pharynx and larynx. After completion, the telescope was removed. Findings included normal nasal passages and no mass or abnormality in the nasopharynx. There is a granular mass involving the right lateral pharyngeal wall inferior to tonsillar area and extending to the midline of the vallecula. The larynx appears normal. Pyriform sinuses are open.  Secretions are minimal. Vocal folds are without mass, scarring, or ulceration. The vocal folds adduct and abduct symmetrically. There is good glottal closure. Muscle tension patterns are not present. Laryngeal edema is minimal.  ?  ?11/11/2020 PET scan  ? 1. Hypermetabolic mass in the RIGHT hypopharynx localizing to the RIGHT tonsillar pillar and vallecula. ?2. Solitary hypermetabolic metastatic lymph node to the RIGHT level II nodal station. ?3. No distant metastatic disease. ?  ?11/16/2020 Initial Diagnosis  ? Pharyngeal carcinoma, squamous cell ([place]) ?  ?11/16/2020 Cancer Staging  ? Staging form: Pharynx - HPV-Mediated Oropharynx, AJCC 8th Edition ?- Clinical stage from 11/16/2020: Stage I (cT2, cN1, cM0, p16+) - Signed by Eppie Gibson, MD on 11/16/2020 ?Stage prefix: Initial diagnosis ? ?  ?12/22/2020  -  Chemotherapy  ? Patient is on Treatment Plan : HEAD/NECK Cisplatin q7d  ?   ?  ? ? ?REVIEW OF SYSTEMS:  ? ?Constitutional: Denies fevers, chills or abnormal weight loss ?Eyes: Denies blurriness of vision ?E

## 2021-05-06 ENCOUNTER — Inpatient Hospital Stay: Payer: Medicare Other | Admitting: Dietician

## 2021-05-06 NOTE — Progress Notes (Signed)
Nutrition Follow-up: ? ?Called patient on mobile number.  He reports still dependent on TF for most of his intake.  He is taking meds some by mouth now.  This morning he had coffee had some pain so he stopped trying to swallow. He's still using Ensure plus 4-5 bottles on most days. He admits when he eats more by mouth he reduces his TF. He can't tolerate anything cold, hasn't tried whole milk which he likes because it's cold. He is taking pudding, applesauce, and mashed potatoes. ?  ?Estimated TF intake per recall: ?1400 kcal, 120 g protein, 1200 ml H2O ?  ?Medications: taking ABX after tooth extraction, metformin, occasional iron, and pain meds now ?  ?Labs: 05/05/21 Hgb 11.1 improving ?  ?Anthropometrics:  ?  ?Weight loss continues 0.5# past month but slowed ?05/05/21       157# ?04/12/21        157.5# ?03/03/21            162.6# ?02/01/21          177.0# ?01/18/21            184# ?  ?  ?Estimated Energy Needs ?  ?Kcals: 0626-9485 ?Protein: 77-92 grams ?Fluid: >/= 2300 ?  ?NUTRITION DIAGNOSIS: Inadequate enteral intake overall related to dysphagia AEB continued weight loss and usual recall of enteral feeds. Continues with inadequate enteral intake just starting to take PO feeds but weight loss continues ?  ? ?INTERVENTION:  Encouraged use of his pain relief and attempts at PO feeds more frequently. Trial of room temp yogurt since taking ABX, consider trial of mashed pinto beans and black eyed peas at room temp. ? ?Goal Ensure Plus  240 ml 6 times per day with 180 FWF to provide:  2100 kcal, 96 g protein, 2160 ml (1080 ml H2O from formula + 1080 ml FWF) & 100% DRI for vitamins and minerals ?  ?  ?MONITORING, EVALUATION, GOAL: PO intake and progression of PO feeds, weight, labs ?  ?  ?Next Visit: Telephone consult 4/20 ?  ?April Manson, RDN, LDN ?Registered Dietitian, [place] ?Part Time Remote (Usual office hours: Tuesday-Thursday) ?Cell: [phone] ?

## 2021-05-19 ENCOUNTER — Encounter: Payer: Self-pay | Admitting: Oncology

## 2021-05-20 LAB — HM DIABETES EYE EXAM

## 2021-06-02 ENCOUNTER — Inpatient Hospital Stay: Payer: Medicare Other | Attending: Hematology and Oncology | Admitting: Hematology and Oncology

## 2021-06-02 ENCOUNTER — Encounter: Payer: Self-pay | Admitting: Hematology and Oncology

## 2021-06-02 ENCOUNTER — Inpatient Hospital Stay: Payer: Medicare Other

## 2021-06-02 ENCOUNTER — Telehealth: Payer: Self-pay

## 2021-06-02 ENCOUNTER — Other Ambulatory Visit: Payer: Self-pay

## 2021-06-02 DIAGNOSIS — C770 Secondary and unspecified malignant neoplasm of lymph nodes of head, face and neck: Secondary | ICD-10-CM

## 2021-06-02 DIAGNOSIS — C109 Malignant neoplasm of oropharynx, unspecified: Secondary | ICD-10-CM

## 2021-06-02 LAB — CBC AND DIFFERENTIAL
HCT: 36 — AB (ref 41–53)
Hemoglobin: 11.1 — AB (ref 13.5–17.5)
Neutrophils Absolute: 3.58
Platelets: 214 10*3/uL (ref 150–400)
WBC: 4.9

## 2021-06-02 LAB — CBC: RBC: 4.11 (ref 3.87–5.11)

## 2021-06-02 LAB — BASIC METABOLIC PANEL
BUN: 18 (ref 4–21)
CO2: 26 — AB (ref 13–22)
Chloride: 103 (ref 99–108)
Creatinine: 0.8 (ref 0.6–1.3)
Glucose: 109
Potassium: 4.9 mEq/L (ref 3.5–5.1)
Sodium: 141 (ref 137–147)

## 2021-06-02 LAB — HEPATIC FUNCTION PANEL
ALT: 13 U/L (ref 10–40)
AST: 27 (ref 14–40)
Alkaline Phosphatase: 52 (ref 25–125)
Bilirubin, Total: 0.4

## 2021-06-02 LAB — COMPREHENSIVE METABOLIC PANEL
Albumin: 4.5 (ref 3.5–5.0)
Calcium: 9.8 (ref 8.7–10.7)

## 2021-06-02 MED ORDER — HYDROCODONE-ACETAMINOPHEN 7.5-325 MG PO TABS: 1.0000 | ORAL_TABLET | ORAL | 0 refills | Status: DC | PRN

## 2021-06-02 MED ORDER — BRIMONIDINE TARTRATE-TIMOLOL 0.2-0.5 % OP SOLN: 1.0000 [drp] | Freq: Two times a day (BID) | OPHTHALMIC | 1 refills | Status: DC

## 2021-06-02 MED ORDER — TRIAMCINOLONE ACETONIDE 0.5 % EX OINT: TOPICAL_OINTMENT | CUTANEOUS | 2 refills | Status: DC

## 2021-06-02 MED ORDER — GUAIFENESIN-CODEINE 100-10 MG/5ML PO SOLN: 5.0000 mL | Freq: Four times a day (QID) | ORAL | 0 refills | Status: DC | PRN

## 2021-06-02 NOTE — Assessment & Plan Note (Addendum)
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  He has a mass involving the right lateral pharyngeal wall inferior to the tonsillar region and extending to the midline of the vallecular. He completed concurrent chemoradiation with weekly cisplatin in December. He tolerated  fairly well, but he has issues with malnutrition and weight loss. Scan shows improvement but still accentuated enhancement in the right vallecula and so he will need naso-pharyngoscopy. He will meet with Dr. Redmond Baseman with ENT next week for possible naso-pharyngoscopy. He continues to be able to eat more solid foods, but does continue to use his feeding tube daily with Ensure shakes. I did clean and apply a drainage sponge to his tube today, provide him with food from our pantry and refill medications including his pain medicine. CBC and CMP are unremarkable today and his weight is stable.  He will return to clinic in 4 weeks for evaluation with Dr. Hinton Rao. At that time, he will have seen ENT and hopefully have more information regarding his current disease status.  ?

## 2021-06-02 NOTE — Telephone Encounter (Addendum)
Melissa P,NP, states she is fine with switching the ointment to cream. I called [facility] and talked with [facility]. ? ?Received call from [facility] asking if Kenalog ointment could be switched to cream, as that is what they have available? Pharmacy wanted to make sure that the cream would suffice for whatever area it is to be applied. I sent message to [facility]. ?

## 2021-06-02 NOTE — Progress Notes (Signed)
?Patient Care Team: ?Biagio Borg, MD as PCP - General (Internal Medicine) ?Landis Martins, DPM as Consulting Physician (Podiatry) ?Irine Seal, MD as Attending Physician (Urology) ?Malmfelt, Stephani Police, RN as Oncology Nurse Navigator ?Melida Quitter, MD as Consulting Physician (Otolaryngology) ?Eppie Gibson, MD as Consulting Physician (Radiation Oncology) ?[name], MD as Consulting Physician (Oncology) ? ?Clinic Day:  06/02/2021 ? ?Referring physician: Biagio Borg, MD ? ?ASSESSMENT & PLAN:  ? 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  He has a mass involving the right lateral pharyngeal wall inferior to the tonsillar region and extending to the midline of the vallecular. He completed concurrent chemoradiation with weekly cisplatin in December. He tolerated  fairly well, but he has issues with malnutrition and weight loss. Scan shows improvement but still accentuated enhancement in the right vallecula and so he will need naso-pharyngoscopy. He will meet with Dr. Redmond Baseman with ENT next week for possible naso-pharyngoscopy. He continues to be able to eat more solid foods, but does continue to use his feeding tube daily with Ensure shakes. I did clean and apply a drainage sponge to his tube today, provide him with food from our pantry and refill medications including his pain medicine. CBC and CMP are unremarkable today and his weight is stable.  He will return to clinic in 4 weeks for evaluation with Dr. Hinton Rao. At that time, he will have seen ENT and hopefully have more information regarding his current disease status.   ? ?The patient understands the plans discussed today and is in agreement with them.  He knows to contact our office if he develops concerns prior to his next appointment. ? ? ? ? ?Melodye Ped, NP  ?[place] ?[address] ?[place] [address] ?Dept: [phone] ?Dept Fax: [phone]  ? ?Orders Placed This Encounter  ?Procedures  ? CBC and differential  ?  This external order was created through the Results Console.  ? CBC  ?  This external order was created through the Results Console.  ? Basic metabolic panel  ?  This external order was created through the Results Console.  ? Comprehensive metabolic panel  ?  This external order was created through the Results Console.  ? Hepatic function panel  ?  This external order was created through the Results Console.  ?  ? ? ?CHIEF COMPLAINT:  ?CC: [name] with history of oropharyngeal cancer here for 4 week evaluation ? ?Current Treatment:  Surveillance ? ?INTERVAL HISTORY:  ?David Kelly is here today for repeat clinical assessment. He denies fevers or chills. He denies pain. His appetite is good. His weight has been stable. ? ?I have reviewed the past medical history, past surgical history, social history and family history with the patient and they are unchanged from previous note. ? ?ALLERGIES:  has No Known Allergies. ? ?MEDICATIONS:  ?Current Outpatient Medications  ?Medication Sig Dispense Refill  ? amoxicillin (AMOXIL) 500 MG capsule Take 500 mg by mouth 3 (three) times daily.    ? Blood Glucose Monitoring Suppl (ACCU-CHEK GUIDE ME) w/Device KIT Use as directed twice per day E11.9 (Patient not taking: Reported on 02/17/2021) 1 kit 0  ? brimonidine-timolol (COMBIGAN) 0.2-0.5 % ophthalmic solution Place 1 drop into both eyes every 12 (twelve) hours. 15 mL 1  ? cephALEXin (KEFLEX) 500 MG capsule Take 500 mg by  mouth 2 (two) times daily.    ? diclofenac sodium (VOLTAREN) 1 % GEL Apply 1 application topically 3 (three) times daily. Right knee and both shoulders. (Patient not taking: Reported on 02/17/2021) 3 Tube 4  ? ferrous sulfate 325 (65 FE) MG tablet Take 325 mg by mouth daily with breakfast. Informed patient to start taking iron supplement every day.  01/11/21    ? fluconazole (DIFLUCAN) 100 MG tablet Take 1 tablet (100 mg total) by mouth daily. (Patient not taking: Reported on 05/05/2021) 10 tablet 0  ? Fluocinolone Acetonide 0.01 % OIL Place 5 drops in ear(s) 2 (two) times daily. (Patient not taking: Reported on 02/17/2021) 20 mL 1  ? fluticasone (FLONASE) 50 MCG/ACT nasal spray Place 2 sprays into both nostrils daily. (Patient not taking: Reported on 02/17/2021) 16 g 6  ? glucose blood (ACCU-CHEK GUIDE) test strip Use as instructed twice per day E11.9 200 each 12  ? guaiFENesin-codeine 100-10 MG/5ML syrup Take 5 mLs by mouth every 6 (six) hours as needed for cough. 120 mL 0  ? HYDROcodone-acetaminophen (NORCO) 7.5-325 MG tablet Take 1 tablet by mouth every 4 (four) hours as needed for moderate pain. 120 tablet 0  ? Lancets MISC Use as directed twice per day E11.9 (Patient not taking: Reported on 02/17/2021) 200 each 12  ? levocetirizine (XYZAL) 5 MG tablet  (Patient not taking: Reported on 02/17/2021)    ? magic mouthwash (nystatin, lidocaine, diphenhydrAMINE, alum & mag hydroxide) suspension Swish and swallow 5 mLs 4 (four) times daily as needed for mouth pain. Swish and swallow 27m QID prn pain. (Patient not taking: Reported on 02/17/2021)    ? metFORMIN (GLUCOPHAGE) 1000 MG tablet Take 1 tablet (1,000 mg total) by mouth 2 (two) times daily with a meal. 180 tablet 3  ? montelukast (SINGULAIR) 10 MG tablet Take 1 tablet (10 mg total) by mouth at bedtime. 90 tablet 3  ? Multiple Vitamin (MULTIVITAMIN WITH MINERALS) TABS tablet Take 1 tablet by mouth daily. (Patient not taking: Reported on 02/17/2021)    ? naproxen (NAPROSYN) 500 MG tablet Take by mouth. (Patient not taking: Reported on 02/17/2021)    ? ondansetron (ZOFRAN) 8 MG tablet Take 1 tablet (8 mg total) by mouth 2 (two) times daily as needed. Start on the third day after cisplatin chemotherapy. (Patient not taking: Reported on 02/17/2021) 30 tablet 1  ? prochlorperazine (COMPAZINE) 10 MG tablet Take 1 tablet (10 mg  total) by mouth every 6 (six) hours as needed (Nausea or vomiting). (Patient not taking: Reported on 02/17/2021) 30 tablet 1  ? psyllium (METAMUCIL) 58.6 % packet Take 1 packet by mouth daily.    ? tamsulosin (FLOMAX) 0.4 MG CAPS capsule Take 1 capsule (0.4 mg total) by mouth at bedtime. (Patient not taking: Reported on 02/17/2021) 90 capsule 3  ? traMADol (ULTRAM) 50 MG tablet Take 1-2 tablets (50-100 mg total) by mouth every 6 (six) hours as needed. (Patient not taking: Reported on 02/17/2021) 120 tablet 0  ? triamcinolone ointment (KENALOG) 0.5 % APPLY TO AFFECTED AREA(S) TWICE DAILY 30 g 2  ? ?No current facility-administered medications for this visit.  ? ? ?HISTORY OF PRESENT ILLNESS:  ? ?Oncology History Overview Note  ?Immunohistochemistry for p16 is strongly positive.  ?  ?Oropharyngeal cancer ([place]  ?09/10/2020 Initial Diagnosis  ? He presented with a palpable knot in his upper right neck to Dr. BRedmond Basemanon 09/10/20. Patient denied any pain at that time, though did report some difficulty swallowing. ?  ?  ?  10/20/2020 Pathology Results  ? FINAL MICROSCOPIC DIAGNOSIS:  ? ?A. LYMPH NODE, RIGHT NECK, NEEDLE CORE BIOPSY:  ?- Metastatic squamous cell carcinoma associated with scanty lymphoid tissue. ?  ?10/20/2020 Procedure  ? Technically successful ultrasound guided biopsy of indeterminate right neck mass/nodal conglomeration. ?  ?10/22/2020 Procedure  ? The fiberoptic laryngoscope was then placed through the nasal passage to view the pharynx and larynx. After completion, the telescope was removed. Findings included normal nasal passages and no mass or abnormality in the nasopharynx. There is a granular mass involving the right lateral pharyngeal wall inferior to tonsillar area and extending to the midline of the vallecula. The larynx appears normal. Pyriform sinuses are open. Secretions are minimal. Vocal folds are without mass, scarring, or ulceration. The vocal folds adduct and abduct symmetrically. There is good glottal  closure. Muscle tension patterns are not present. Laryngeal edema is minimal.  ?  ?11/11/2020 PET scan  ? 1. Hypermetabolic mass in the RIGHT hypopharynx localizing to the RIGHT tonsillar pillar and vallecula.

## 2021-06-03 ENCOUNTER — Inpatient Hospital Stay: Payer: Medicare Other | Admitting: Dietician

## 2021-06-03 NOTE — Progress Notes (Signed)
Nutrition Follow-up: ?  ?Called patient on mobile number.  He reports " I feel like I am doing just fine." ?He's still using Ensure plus 3-4 bottles on most days. He isn't taking in more Po do to pain when swallowing reports pain still 8 on scale 1-10.  He is taking pudding, applesauce, and mashed potatoes. ?  ?Estimated TF intake per recall: ?1050-1400 kcal, 39-52 g protein,  540-720 ml H2O ? ?  ?Labs: 06/02/21 Hgb 11.1  ?  ?Anthropometrics: One pound weight gain past month ?  ?06/02/21    158# ?05/05/21       157# ?04/12/21        157.5# ?03/03/21            162.6# ?02/01/21          177.0# ?01/18/21            184# ?  ?  ?Estimated Energy Needs ?  ?Kcals: 6712-4580 ?Protein: 77-92 grams ?Fluid: >/= 2300 ?  ?NUTRITION DIAGNOSIS: Inadequate enteral intake overall related to dysphagia AEB continued weight loss and usual recall of enteral feeds. Improving as he is  starting to take PO feeds but protein intake now a concern ?  ?  ?INTERVENTION:  Encouraged attempts at soft moist proteins. Suggested if he is unable to take high protein food PO to switch back to Ensure Complete if he's only taking 3 bottles/day.   Encouraged him to ask for coupons at cancer center.  ? ?  ?  ?MONITORING, EVALUATION, GOAL: PO intake and progression of PO feeds, weight, labs ?  ?  ?Next Visit: Telephone consult 3 months or at MD or PT request. ?  ?April Manson, RDN, LDN ?Registered Dietitian, [place] ?Part Time Remote (Usual office hours: Tuesday-Thursday) ?Cell: [phone] ?

## 2021-06-04 ENCOUNTER — Encounter: Payer: Self-pay | Admitting: Oncology

## 2021-06-04 ENCOUNTER — Encounter: Payer: Self-pay | Admitting: Hematology and Oncology

## 2021-06-07 ENCOUNTER — Encounter: Payer: Self-pay | Admitting: Oncology

## 2021-06-07 ENCOUNTER — Encounter: Payer: Self-pay | Admitting: Hematology and Oncology

## 2021-06-08 ENCOUNTER — Encounter: Payer: Self-pay | Admitting: Oncology

## 2021-06-08 ENCOUNTER — Encounter: Payer: Self-pay | Admitting: Hematology and Oncology

## 2021-06-25 ENCOUNTER — Ambulatory Visit (INDEPENDENT_AMBULATORY_CARE_PROVIDER_SITE_OTHER): Payer: Medicare Other | Admitting: Sports Medicine

## 2021-06-25 ENCOUNTER — Encounter: Payer: Self-pay | Admitting: Sports Medicine

## 2021-06-25 DIAGNOSIS — M21619 Bunion of unspecified foot: Secondary | ICD-10-CM

## 2021-06-25 DIAGNOSIS — M79676 Pain in unspecified toe(s): Secondary | ICD-10-CM

## 2021-06-25 DIAGNOSIS — M2042 Other hammer toe(s) (acquired), left foot: Secondary | ICD-10-CM

## 2021-06-25 DIAGNOSIS — E119 Type 2 diabetes mellitus without complications: Secondary | ICD-10-CM | POA: Diagnosis not present

## 2021-06-25 DIAGNOSIS — M2041 Other hammer toe(s) (acquired), right foot: Secondary | ICD-10-CM

## 2021-06-25 DIAGNOSIS — M2140 Flat foot [pes planus] (acquired), unspecified foot: Secondary | ICD-10-CM

## 2021-06-25 DIAGNOSIS — B351 Tinea unguium: Secondary | ICD-10-CM

## 2021-06-25 NOTE — Progress Notes (Signed)
?  Subjective: ?David Kelly is a 76 y.o. male patient with history of diabetes who returns to office today complaining of long, painful nails while ambulating in shoes; unable to trim. Denies any other pedal complaints. ? ?Fasting blood sugar 90 ? ?PCP Biagio Borg, MD last visit more than a year ago and reports that Dr. Hinton Rao has been giving him his diabetic meds. Last see her last month. Patient reports he is finished all of his radiation treatment but feels weak; denies any other current complaints at this time. ? ?Patient Active Problem List  ? Diagnosis Date Noted  ? History of radiation to head and neck region 04/02/2021  ? Loss of weight 04/02/2021  ? Xerostomia due to radiotherapy 04/02/2021  ? Dysgeusia 04/02/2021  ? Dysphagia 04/02/2021  ? Odynophagia 04/02/2021  ? Accretions on teeth 04/02/2021  ? Dehydration 02/10/2021  ? Weight loss 02/10/2021  ? Postoperative examination 12/09/2020  ? Deficiency anemia 12/03/2020  ?  Class: Chronic  ? Loss of teeth due to extraction 12/01/2020  ? Retained tooth root 12/01/2020  ? Preventive measure   ? Oropharyngeal cancer ([place]) 10/22/2020  ? Metastasis to head and neck lymph node ([place]) 10/22/2020  ? Neck mass 09/10/2020  ? Vitamin D deficiency 04/30/2020  ? B12 deficiency 04/29/2020  ? Abscess of finger 09/06/2018  ? MI (myocardial infarction) ([place]) 12/11/2017  ? History of left knee replacement 12/11/2017  ? HLD (hyperlipidemia) 09/28/2017  ? Chronic cough 03/31/2017  ? Primary osteoarthritis of right knee 03/15/2017  ? Unspecified injury of lower back, sequela 03/15/2017  ? Bilateral rotator cuff syndrome 03/15/2017  ? Recurrent falls while walking 12/13/2016  ? Chronic pain 12/13/2016  ? Kidney stones   ? Glaucoma   ? Gastrointestinal bleed 06/28/2016  ? Dysuria 10/28/2015  ? Diabetes mellitus ([place]) 07/15/2015  ? Osteoarthritis 07/15/2015  ? ? ?No Known Allergies ? ? ? ?Objective: ?General: Patient is awake, alert, and oriented x 3 and in no acute  distress. ? ?Integument: Skin is warm, dry and supple bilateral. Nails are tender, mildly elongated, thickened and dystrophic with subungual debris, consistent with onychomycosis, 1-5 bilateral. No signs of infection.  No open lesions remaining integument unremarkable. ? ?Neurovascular status intact bilateral ? ?Musculoskeletal: Asymptomatic bunion and hammertoe and planus pedal deformities noted bilateral. Muscular strength 5/5 in all lower extremity muscular groups bilateral without pain on range of motion. No tenderness with calf compression bilateral. ? ?Assessment and Plan: ?Problem List Items Addressed This Visit   ?None ?Visit Diagnoses   ? ? Pain due to onychomycosis of toenail    -  Primary  ? Diabetes mellitus without complication ([place])      ? Relevant Medications  ? atorvastatin (LIPITOR) 40 MG tablet  ? glipiZIDE (GLUCOTROL XL) 5 MG 24 hr tablet  ? Hammer toes of both feet      ? Pes planus, unspecified laterality      ? Bunion      ? ?  ? ?-Examined patient. ?-Encouraged the importance of daily foot inspection in the setting of diabetes ?-Mechanically debrided all painful nails 1-5 bilateral using sterile nail nipper and filed with dremel without incident ?-Patient to return  in 3 months for at risk foot care or sooner if there are any problems or issues ? ?Landis Martins, DPM ?

## 2021-06-30 ENCOUNTER — Other Ambulatory Visit: Payer: Self-pay | Admitting: Oncology

## 2021-06-30 DIAGNOSIS — C770 Secondary and unspecified malignant neoplasm of lymph nodes of head, face and neck: Secondary | ICD-10-CM

## 2021-06-30 NOTE — Progress Notes (Signed)
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  He has a mass involving the right lateral pharyngeal wall inferior to the tonsillar region and extending to the midline of the vallecular. He completed concurrent chemoradiation with weekly cisplatin in December. He tolerated  fairly well, but he has issues with malnutrition and weight loss.  CT scan from February 2023 showed improvement but still accentuated enhancement in the right vallecula and he will follow-up with Dr. Redmond Baseman with ENT with naso-pharyngoscopy, which was negative. He continues to be able to eat more solid foods, but does continue to use his feeding tube daily with Ensure shakes.  The nurses cleaned and dressed his feeding tube.  We refilled his medications today including his pain medicine. CBC and CMP are unremarkable today and he still has mild stable anemia.  His weight is is up 1-1/2 pounds.    I will refill guaifenesin with codeine, triamcinolone 0.5% for his ears, ibuprofen 800 mg as needed, hydrocodone 7.5/325 mg, which he takes 2-3 daily.  He we will also refill the Combigan for his eyes and give him some Ensure and dressing supplies.  He will return to clinic in 6 weeks for evaluation with CBC and CMP.  I will plan to repeat scans in 12 weeks.The patient understands the plans discussed today and is in agreement with them.  He knows  to contact our office if he develops concerns prior to his next appointment.     [name], MD  [facility] [address] [place] [address] Dept: [phone] Dept Fax: [phone]   Orders Placed This Encounter  Procedures   CBC and differential    This external order was created through the Results Console.   CBC    This external order was created through the Results Console.   Basic metabolic panel    This external order was created through the Results Console.   Comprehensive metabolic panel    This external order was created through the Results Console.   Hepatic function panel    This external order was created through the Results Console.      CHIEF COMPLAINT:  CC: [name] with history of oropharyngeal cancer here for 4 week evaluation  Current Treatment:  Surveillance  INTERVAL HISTORY:  Morrill is here today for repeat clinical assessment. He denies fevers or chills. He denies pain. His appetite is good. His weight has been stable.  I have reviewed the past medical history, past surgical history, social history and family history with the patient and they are unchanged from previous note.  ALLERGIES:  has No Known Allergies.  MEDICATIONS:  Current Outpatient Medications  Medication Sig Dispense Refill   atorvastatin (LIPITOR) 40 MG tablet Take 1 tablet by mouth daily.     brimonidine-timolol (COMBIGAN) 0.2-0.5 % ophthalmic solution Place 1 drop into both eyes every 12 (twelve) hours. 15 mL 1   cephALEXin (KEFLEX)  500 MG capsule Take 1 capsule (500 mg total) by mouth 2 (two) times daily. 14 capsule 0   cyanocobalamin 1000 MCG tablet Take by mouth.     ferrous sulfate 325 (65 FE) MG tablet Take 325 mg by mouth daily with breakfast. Informed patient to start taking iron supplement every day. 01/11/21     fluticasone (FLONASE) 50 MCG/ACT nasal spray Place into the nose.      glipiZIDE (GLUCOTROL XL) 5 MG 24 hr tablet Take 1 tablet by mouth daily.     glucose blood (ACCU-CHEK GUIDE) test strip Use as instructed twice per day E11.9 200 each 12   guaiFENesin-codeine 100-10 MG/5ML syrup Take 5 mLs by mouth every 6 (six) hours as needed for cough. 240 mL 0   HYDROcodone-acetaminophen (NORCO) 7.5-325 MG tablet Take 1 tablet by mouth every 4 (four) hours as needed for moderate pain. 120 tablet 0   ibuprofen (ADVIL) 800 MG tablet Take 1 tablet (800 mg total) by mouth every 8 (eight) hours as needed. 30 tablet 5   metFORMIN (GLUCOPHAGE) 1000 MG tablet Take 1 tablet (1,000 mg total) by mouth 2 (two) times daily with a meal. 180 tablet 3   montelukast (SINGULAIR) 10 MG tablet Take 1 tablet (10 mg total) by mouth at bedtime. 90 tablet 3   ofloxacin (OCUFLOX) 0.3 % ophthalmic solution      prednisoLONE acetate (PRED FORTE) 1 % ophthalmic suspension SMARTSIG:In Eye(s)     psyllium (METAMUCIL) 58.6 % packet Take 1 packet by mouth daily.     triamcinolone ointment (KENALOG) 0.5 % APPLY TO AFFECTED AREA(S) TWICE DAILY 30 g 2   No current facility-administered medications for this visit.    HISTORY OF PRESENT ILLNESS:   Oncology History Overview Note  Immunohistochemistry for p16 is strongly positive.    Oropharyngeal cancer ([place])  09/10/2020 Initial Diagnosis   He presented with a palpable knot in his upper right neck to Dr. Redmond Baseman on 09/10/20. Patient denied any pain at that time, though did report some difficulty swallowing.     10/20/2020 Pathology Results   FINAL MICROSCOPIC DIAGNOSIS:   A. LYMPH NODE, RIGHT NECK, NEEDLE CORE BIOPSY:  - Metastatic squamous cell carcinoma associated with scanty lymphoid tissue.   10/20/2020 Procedure   Technically successful ultrasound guided biopsy of indeterminate right neck mass/nodal conglomeration.   10/22/2020 Procedure   The fiberoptic laryngoscope was then placed through the nasal passage to view the pharynx and larynx. After  completion, the telescope was removed. Findings included normal nasal passages and no mass or abnormality in the nasopharynx. There is a granular mass involving the right lateral pharyngeal wall inferior to tonsillar area and extending to the midline of the vallecula. The larynx appears normal. Pyriform sinuses are open. Secretions are minimal. Vocal folds are without mass, scarring, or ulceration. The vocal folds adduct and abduct symmetrically. There is good glottal closure. Muscle tension patterns are not present. Laryngeal edema is minimal.    11/11/2020 PET scan   1. Hypermetabolic mass in the RIGHT hypopharynx localizing to the RIGHT tonsillar pillar and vallecula. 2. Solitary hypermetabolic metastatic lymph node to the RIGHT level II nodal station. 3. No distant metastatic disease.   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[place])   11/16/2020 Cancer Staging   Staging form: Pharynx - HPV-Mediated Oropharynx, AJCC 8th Edition - Clinical stage from 11/16/2020: Stage I (cT2, cN1, cM0, p16+) - Signed by Eppie Gibson, MD on 11/16/2020 Stage prefix: Initial diagnosis  12/22/2020 - 02/01/2021 Chemotherapy   Patient is on Treatment Plan : HEAD/NECK Cisplatin q7d         REVIEW OF SYSTEMS:   Constitutional: Denies fevers, chills or abnormal weight loss Eyes: Denies blurriness of vision Ears, nose, mouth, throat, and face: Denies mucositis or sore throat Respiratory: Denies cough, dyspnea or wheezes Cardiovascular: Denies palpitation, chest discomfort or lower extremity swelling Gastrointestinal:  Denies nausea, heartburn or change in bowel habits Skin: Denies abnormal skin rashes Lymphatics: Denies new lymphadenopathy or easy bruising Neurological:Denies numbness, tingling or new weaknesses Behavioral/Psych: Mood is stable, no new changes  All other systems were reviewed with the patient and are negative.   VITALS:  Blood pressure 132/67, pulse 77, temperature  97.9 F (36.6 C), temperature source Oral, resp. rate 18, height '5\' 8"'$  (1.727 m), weight 159 lb 9.6 oz (72.4 kg), SpO2 98 %.  Wt Readings from Last 3 Encounters:  07/01/21 159 lb 9.6 oz (72.4 kg)  06/02/21 158 lb 1.6 oz (71.7 kg)  05/05/21 157 lb (71.2 kg)    Body mass index is 24.27 kg/m.  Performance status (ECOG): 1 - Symptomatic but completely ambulatory  PHYSICAL EXAM:   GENERAL:alert, no distress and comfortable SKIN: skin color, texture, turgor are normal, no rashes or significant lesions EYES: normal, Conjunctiva are pink and non-injected, sclera clear OROPHARYNX:no exudate, no erythema and lips, buccal mucosa, and tongue normal  NECK: supple, thyroid normal size, non-tender, without nodularity LYMPH:  no palpable lymphadenopathy in the cervical, axillary or inguinal LUNGS: clear to auscultation and percussion with normal breathing effort HEART: regular rate & rhythm and no murmurs and no lower extremity edema ABDOMEN:abdomen soft, non-tender and normal bowel sounds Musculoskeletal:no cyanosis of digits and no clubbing  NEURO: alert & oriented x 3 with fluent speech, no focal motor/sensory deficits  LABORATORY DATA:  I have reviewed the data as listed    Component Value Date/Time   NA 141 07/01/2021 0000   K 4.1 07/01/2021 0000   CL 103 07/01/2021 0000   CO2 26 (A) 07/01/2021 0000   GLUCOSE 164 (H) 11/20/2020 0800   BUN 21 07/01/2021 0000   CREATININE 0.7 07/01/2021 0000   CREATININE 0.76 11/20/2020 0800   CALCIUM 9.5 07/01/2021 0000   PROT 7.5 04/29/2020 1501   ALBUMIN 4.5 07/01/2021 0000   AST 25 07/01/2021 0000   ALT 14 07/01/2021 0000   ALKPHOS 56 07/01/2021 0000   BILITOT 0.4 04/29/2020 1501   GFRNONAA >60 11/20/2020 0800   GFRAA >60 05/17/2016 2106    No results found for: "SPEP", "UPEP"  Lab Results  Component Value Date   WBC 5.9 07/01/2021   NEUTROABS 4.66 07/01/2021   HGB 11.3 (A) 07/01/2021   HCT 37 (A) 07/01/2021   MCV 83 (A) 03/03/2021    PLT 227 07/01/2021      Chemistry      Component Value Date/Time   NA 141 07/01/2021 0000   K 4.1 07/01/2021 0000   CL 103 07/01/2021 0000   CO2 26 (A) 07/01/2021 0000   BUN 21 07/01/2021 0000   CREATININE 0.7 07/01/2021 0000   CREATININE 0.76 11/20/2020 0800   GLU 121 07/01/2021 0000      Component Value Date/Time   CALCIUM 9.5 07/01/2021 0000   ALKPHOS 56 07/01/2021 0000   AST 25 07/01/2021 0000   ALT 14 07/01/2021 0000   BILITOT 0.4 04/29/2020 1501       RADIOGRAPHIC STUDIES: I have personally reviewed the radiological images as listed  and agreed with the findings in the report. No results found.

## 2021-07-01 ENCOUNTER — Other Ambulatory Visit: Payer: Self-pay | Admitting: Oncology

## 2021-07-01 ENCOUNTER — Inpatient Hospital Stay: Payer: Medicare Other

## 2021-07-01 ENCOUNTER — Inpatient Hospital Stay: Payer: Medicare Other | Attending: Hematology and Oncology | Admitting: Oncology

## 2021-07-01 ENCOUNTER — Encounter: Payer: Self-pay | Admitting: Oncology

## 2021-07-01 VITALS — BP 132/67 | HR 77 | Temp 97.9°F | Resp 18 | Ht 68.0 in | Wt 159.6 lb

## 2021-07-01 DIAGNOSIS — C770 Secondary and unspecified malignant neoplasm of lymph nodes of head, face and neck: Secondary | ICD-10-CM

## 2021-07-01 DIAGNOSIS — H409 Unspecified glaucoma: Secondary | ICD-10-CM

## 2021-07-01 DIAGNOSIS — Z923 Personal history of irradiation: Secondary | ICD-10-CM

## 2021-07-01 DIAGNOSIS — C109 Malignant neoplasm of oropharynx, unspecified: Secondary | ICD-10-CM

## 2021-07-01 DIAGNOSIS — M159 Polyosteoarthritis, unspecified: Secondary | ICD-10-CM

## 2021-07-01 DIAGNOSIS — R053 Chronic cough: Secondary | ICD-10-CM

## 2021-07-01 LAB — BASIC METABOLIC PANEL
BUN: 21 (ref 4–21)
CO2: 26 — AB (ref 13–22)
Chloride: 103 (ref 99–108)
Creatinine: 0.7 (ref 0.6–1.3)
Glucose: 121
Potassium: 4.1 mEq/L (ref 3.5–5.1)
Sodium: 141 (ref 137–147)

## 2021-07-01 LAB — HEPATIC FUNCTION PANEL
ALT: 14 U/L (ref 10–40)
AST: 25 (ref 14–40)
Alkaline Phosphatase: 56 (ref 25–125)
Bilirubin, Total: 0.3

## 2021-07-01 LAB — CBC AND DIFFERENTIAL
HCT: 37 — AB (ref 41–53)
Hemoglobin: 11.3 — AB (ref 13.5–17.5)
Neutrophils Absolute: 4.66
Platelets: 227 10*3/uL (ref 150–400)
WBC: 5.9

## 2021-07-01 LAB — COMPREHENSIVE METABOLIC PANEL
Albumin: 4.5 (ref 3.5–5.0)
Calcium: 9.5 (ref 8.7–10.7)

## 2021-07-01 LAB — CBC: RBC: 4.31 (ref 3.87–5.11)

## 2021-07-01 MED ORDER — IBUPROFEN 800 MG PO TABS: 800.0000 mg | ORAL_TABLET | Freq: Three times a day (TID) | ORAL | 5 refills | Status: DC | PRN

## 2021-07-01 MED ORDER — BRIMONIDINE TARTRATE-TIMOLOL 0.2-0.5 % OP SOLN: 1.0000 [drp] | Freq: Two times a day (BID) | OPHTHALMIC | 1 refills | Status: DC

## 2021-07-01 MED ORDER — TRIAMCINOLONE ACETONIDE 0.5 % EX OINT: TOPICAL_OINTMENT | CUTANEOUS | 2 refills | Status: DC

## 2021-07-01 MED ORDER — GUAIFENESIN-CODEINE 100-10 MG/5ML PO SOLN: 5.0000 mL | Freq: Four times a day (QID) | ORAL | 0 refills | Status: DC | PRN

## 2021-07-01 MED ORDER — HYDROCODONE-ACETAMINOPHEN 7.5-325 MG PO TABS: 1.0000 | ORAL_TABLET | ORAL | 0 refills | Status: DC | PRN

## 2021-07-09 ENCOUNTER — Other Ambulatory Visit: Payer: Self-pay | Admitting: Hematology and Oncology

## 2021-07-09 MED ORDER — CEPHALEXIN 500 MG PO CAPS: 500.0000 mg | ORAL_CAPSULE | Freq: Two times a day (BID) | ORAL | 0 refills | Status: DC

## 2021-07-19 ENCOUNTER — Other Ambulatory Visit: Payer: Self-pay | Admitting: Hematology and Oncology

## 2021-07-19 DIAGNOSIS — C770 Secondary and unspecified malignant neoplasm of lymph nodes of head, face and neck: Secondary | ICD-10-CM

## 2021-07-19 MED ORDER — HYDROCODONE-ACETAMINOPHEN 7.5-325 MG PO TABS: 1.0000 | ORAL_TABLET | ORAL | 0 refills | Status: DC | PRN

## 2021-07-26 ENCOUNTER — Encounter: Payer: Self-pay | Admitting: Hematology and Oncology

## 2021-07-26 ENCOUNTER — Other Ambulatory Visit: Payer: Self-pay | Admitting: Oncology

## 2021-08-10 NOTE — Progress Notes (Signed)
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  He has a mass involving the right lateral pharyngeal wall inferior to the tonsillar region and extending to the midline of the vallecular. He completed concurrent chemoradiation with weekly cisplatin in December. He tolerated  fairly well, but he has issues with malnutrition and weight loss.  CT scan from February 2023 showed improvement but still accentuated enhancement in the right vallecula and he did follow-up with Dr. Redmond Baseman with ENT with naso-pharyngoscopy, which was negative. He continues to be able to eat more solid foods, but does continue to use his feeding tube daily with Ensure shakes, as well as milk and water.    Worsening anemia I will check B12, folate and iron studies and let him know if he needs supplement of these. Hemoglobin has dropped from 11.3 to 10.3.  I will refill guaifenesin with codeine. I will check iron studies, B12 and folate, and let him know if he needs to supplement these. He will return to clinic in 1 month for evaluation with CBC and CMP. The nurses cleaned and dressed his feeding tube. I will plan to repeat scans in August or September.The patient understands the plans discussed today and is in agreement with them.  He knows to contact our office if he develops concerns prior to his next  appointment.     [name], MD  [facility] AT [facility] [address] [place] [address] Dept: [phone] Dept Fax: [phone]   Orders Placed This Encounter  Procedures   CBC and differential    This external order was created through the Results Console.   CBC    This external order was created through the Results Console.   Basic metabolic panel    This external order was created through the Results Console.   Comprehensive metabolic panel    This external order was created through the Results Console.   Hepatic function panel    This external order was created through the Results Console.      CHIEF COMPLAINT:  CC: [name] with history of oropharyngeal cancer here for 4 week evaluation  Current Treatment:  Surveillance  INTERVAL HISTORY:  David Kelly is here today for repeat clinical assessment. He complains of soreness of the throat bilaterally. He requests a refill of his cough medication. He is asking when he can have the feeding tube removed but still nervous about his ability to eat.  CMP is unremarkable but his hemoglobin has dropped from 11.3 to 10.3 so I will order an anemia evaluation. I will call with those results.  He denies fevers or chills. His appetite is good. His weight has increased 2 pounds.  I have reviewed the past medical history, past surgical history, social history and family history with the patient and they are unchanged from previous note.  ALLERGIES:  has No Known Allergies.  MEDICATIONS:  Current Outpatient Medications  Medication  Sig Dispense Refill   atorvastatin (LIPITOR) 40 MG tablet Take 1 tablet by mouth daily.     brimonidine-timolol (COMBIGAN) 0.2-0.5 % ophthalmic solution Place 1 drop into both eyes every 12 (twelve) hours. 15 mL 1   cephALEXin (KEFLEX) 500 MG capsule Take 1 capsule (500 mg total) by mouth 2 (two) times daily. 14 capsule 0    cyanocobalamin 1000 MCG tablet Take by mouth.     ferrous sulfate 325 (65 FE) MG tablet Take 325 mg by mouth daily with breakfast. Informed patient to start taking iron supplement every day. 01/11/21     fluticasone (FLONASE) 50 MCG/ACT nasal spray Place into the nose.     glipiZIDE (GLUCOTROL XL) 5 MG 24 hr tablet Take 1 tablet by mouth daily.     glucose blood (ACCU-CHEK GUIDE) test strip Use as instructed twice per day E11.9 200 each 12   guaiFENesin-codeine 100-10 MG/5ML syrup Take 5 mLs by mouth every 6 (six) hours as needed for cough. 240 mL 0   HYDROcodone-acetaminophen (NORCO) 7.5-325 MG tablet Take 1 tablet by mouth every 4 (four) hours as needed for moderate pain. 120 tablet 0   ibuprofen (ADVIL) 800 MG tablet Take 1 tablet (800 mg total) by mouth every 8 (eight) hours as needed. 30 tablet 5   metFORMIN (GLUCOPHAGE) 1000 MG tablet Take 1 tablet (1,000 mg total) by mouth 2 (two) times daily with a meal. 180 tablet 3   montelukast (SINGULAIR) 10 MG tablet Take 1 tablet (10 mg total) by mouth at bedtime. 90 tablet 3   ofloxacin (OCUFLOX) 0.3 % ophthalmic solution      prednisoLONE acetate (PRED FORTE) 1 % ophthalmic suspension SMARTSIG:In Eye(s)     psyllium (METAMUCIL) 58.6 % packet Take 1 packet by mouth daily.     triamcinolone ointment (KENALOG) 0.5 % APPLY TO AFFECTED AREA(S) TWICE DAILY 30 g 2   No current facility-administered medications for this visit.    HISTORY OF PRESENT ILLNESS:   Oncology History Overview Note  Immunohistochemistry for p16 is strongly positive.    Oropharyngeal cancer ([place])  09/10/2020 Initial Diagnosis   He presented with a palpable knot in his upper right neck to Dr. Redmond Baseman on 09/10/20. Patient denied any pain at that time, though did report some difficulty swallowing.     10/20/2020 Pathology Results   FINAL MICROSCOPIC DIAGNOSIS:   A. LYMPH NODE, RIGHT NECK, NEEDLE CORE BIOPSY:  - Metastatic squamous cell carcinoma associated with scanty lymphoid  tissue.   10/20/2020 Procedure   Technically successful ultrasound guided biopsy of indeterminate right neck mass/nodal conglomeration.   10/22/2020 Procedure   The fiberoptic laryngoscope was then placed through the nasal passage to view the pharynx and larynx. After completion, the telescope was removed. Findings included normal nasal passages and no mass or abnormality in the nasopharynx. There is a granular mass involving the right lateral pharyngeal wall inferior to tonsillar area and extending to the midline of the vallecula. The larynx appears normal. Pyriform sinuses are open. Secretions are minimal. Vocal folds are without mass, scarring, or ulceration. The vocal folds adduct and abduct symmetrically. There is good glottal closure. Muscle tension patterns are not present. Laryngeal edema is minimal.    11/11/2020 PET scan   1. Hypermetabolic mass in the RIGHT hypopharynx localizing to the RIGHT tonsillar pillar and vallecula. 2. Solitary hypermetabolic metastatic lymph node to the RIGHT level II nodal station. 3. No distant metastatic disease.   11/16/2020 Initial Diagnosis   Pharyngeal carcinoma, squamous  cell ([place])   11/16/2020 Cancer Staging   Staging form: Pharynx - HPV-Mediated Oropharynx, AJCC 8th Edition - Clinical stage from 11/16/2020: Stage I (cT2, cN1, cM0, p16+) - Signed by Eppie Gibson, MD on 11/16/2020 Stage prefix: Initial diagnosis   12/22/2020 - 02/01/2021 Chemotherapy   Patient is on Treatment Plan : HEAD/NECK Cisplatin q7d         REVIEW OF SYSTEMS:   Constitutional: Denies fevers, chills or abnormal weight loss Eyes: Denies blurriness of vision Ears, nose, mouth, throat, and face: Denies mucositis or sore throat Respiratory: Denies cough, dyspnea or wheezes Cardiovascular: Denies palpitation, chest discomfort or lower extremity swelling Gastrointestinal:  Denies nausea, heartburn or change in bowel habits Skin: Denies abnormal skin rashes Lymphatics: Denies new  lymphadenopathy or easy bruising Neurological:Denies numbness, tingling or new weaknesses Behavioral/Psych: Mood is stable, no new changes  All other systems were reviewed with the patient and are negative.   VITALS:  Blood pressure 113/64, pulse 75, temperature (!) 97.5 F (36.4 C), temperature source Oral, resp. rate 20, height '5\' 8"'$  (1.727 m), weight 159 lb 11.2 oz (72.4 kg), SpO2 98 %.  Wt Readings from Last 3 Encounters:  08/12/21 159 lb 11.2 oz (72.4 kg)  07/01/21 159 lb 9.6 oz (72.4 kg)  06/02/21 158 lb 1.6 oz (71.7 kg)    Body mass index is 24.28 kg/m.  Performance status (ECOG): 1 - Symptomatic but completely ambulatory  PHYSICAL EXAM:   GENERAL:alert, no distress and comfortable SKIN: skin color, texture, turgor are normal, no rashes or significant lesions EYES: normal, Conjunctiva are pink and non-injected, sclera clear OROPHARYNX:no exudate, no erythema and lips, buccal mucosa, and tongue normal  NECK: supple, thyroid normal size, non-tender, without nodularity LYMPH:  no palpable lymphadenopathy in the cervical, axillary or inguinal LUNGS: clear to auscultation and percussion with normal breathing effort HEART: regular rate & rhythm and no murmurs and no lower extremity edema ABDOMEN:abdomen soft, non-tender and normal bowel sounds Musculoskeletal:no cyanosis of digits and no clubbing  NEURO: alert & oriented x 3 with fluent speech, no focal motor/sensory deficits  LABORATORY DATA:  I have reviewed the data as listed    Component Value Date/Time   NA 138 08/12/2021 0000   K 4.0 08/12/2021 0000   CL 103 08/12/2021 0000   CO2 26 (A) 08/12/2021 0000   GLUCOSE 164 (H) 11/20/2020 0800   BUN 21 08/12/2021 0000   CREATININE 0.8 08/12/2021 0000   CREATININE 0.76 11/20/2020 0800   CALCIUM 9.5 08/12/2021 0000   PROT 7.5 04/29/2020 1501   ALBUMIN 4.4 08/12/2021 0000   AST 20 08/12/2021 0000   ALT 14 08/12/2021 0000   ALKPHOS 49 08/12/2021 0000   BILITOT 0.4  04/29/2020 1501   GFRNONAA >60 11/20/2020 0800   GFRAA >60 05/17/2016 2106    No results found for: "SPEP", "UPEP"  Lab Results  Component Value Date   WBC 6.3 08/12/2021   NEUTROABS 4.98 08/12/2021   HGB 10.3 (A) 08/12/2021   HCT 32 (A) 08/12/2021   MCV 83 (A) 03/03/2021   PLT 222 08/12/2021      Chemistry      Component Value Date/Time   NA 138 08/12/2021 0000   K 4.0 08/12/2021 0000   CL 103 08/12/2021 0000   CO2 26 (A) 08/12/2021 0000   BUN 21 08/12/2021 0000   CREATININE 0.8 08/12/2021 0000   CREATININE 0.76 11/20/2020 0800   GLU 113 08/12/2021 0000      Component Value Date/Time  CALCIUM 9.5 08/12/2021 0000   ALKPHOS 49 08/12/2021 0000   AST 20 08/12/2021 0000   ALT 14 08/12/2021 0000   BILITOT 0.4 04/29/2020 1501       RADIOGRAPHIC STUDIES: I have personally reviewed the radiological images as listed and agreed with the findings in the report. No results found.

## 2021-08-12 ENCOUNTER — Other Ambulatory Visit: Payer: Self-pay | Admitting: Oncology

## 2021-08-12 ENCOUNTER — Other Ambulatory Visit: Payer: Self-pay

## 2021-08-12 ENCOUNTER — Inpatient Hospital Stay: Payer: Medicare Other

## 2021-08-12 ENCOUNTER — Inpatient Hospital Stay: Payer: Medicare Other | Attending: Hematology and Oncology | Admitting: Oncology

## 2021-08-12 ENCOUNTER — Encounter: Payer: Self-pay | Admitting: Oncology

## 2021-08-12 ENCOUNTER — Other Ambulatory Visit: Payer: Self-pay | Admitting: Hematology and Oncology

## 2021-08-12 VITALS — BP 113/64 | HR 75 | Temp 97.5°F | Resp 20 | Ht 68.0 in | Wt 159.7 lb

## 2021-08-12 DIAGNOSIS — C770 Secondary and unspecified malignant neoplasm of lymph nodes of head, face and neck: Secondary | ICD-10-CM | POA: Diagnosis not present

## 2021-08-12 DIAGNOSIS — C109 Malignant neoplasm of oropharynx, unspecified: Secondary | ICD-10-CM | POA: Diagnosis not present

## 2021-08-12 DIAGNOSIS — D539 Nutritional anemia, unspecified: Secondary | ICD-10-CM

## 2021-08-12 DIAGNOSIS — R053 Chronic cough: Secondary | ICD-10-CM

## 2021-08-12 DIAGNOSIS — C140 Malignant neoplasm of pharynx, unspecified: Secondary | ICD-10-CM | POA: Diagnosis present

## 2021-08-12 LAB — IRON AND TIBC
Iron: 61 ug/dL (ref 45–182)
Saturation Ratios: 17 % — ABNORMAL LOW (ref 17.9–39.5)
TIBC: 353 ug/dL (ref 250–450)
UIBC: 292 ug/dL

## 2021-08-12 LAB — COMPREHENSIVE METABOLIC PANEL
Albumin: 4.4 (ref 3.5–5.0)
Calcium: 9.5 (ref 8.7–10.7)

## 2021-08-12 LAB — HEPATIC FUNCTION PANEL
ALT: 14 U/L (ref 10–40)
AST: 20 (ref 14–40)
Alkaline Phosphatase: 49 (ref 25–125)
Bilirubin, Total: 0.5

## 2021-08-12 LAB — CBC AND DIFFERENTIAL
HCT: 32 — AB (ref 41–53)
Hemoglobin: 10.3 — AB (ref 13.5–17.5)
Neutrophils Absolute: 4.98
Platelets: 222 10*3/uL (ref 150–400)
WBC: 6.3

## 2021-08-12 LAB — FERRITIN: Ferritin: 90 ng/mL (ref 24–336)

## 2021-08-12 LAB — BASIC METABOLIC PANEL
BUN: 21 (ref 4–21)
CO2: 26 — AB (ref 13–22)
Chloride: 103 (ref 99–108)
Creatinine: 0.8 (ref 0.6–1.3)
Glucose: 113
Potassium: 4 mEq/L (ref 3.5–5.1)
Sodium: 138 (ref 137–147)

## 2021-08-12 LAB — CBC: RBC: 3.82 — AB (ref 3.87–5.11)

## 2021-08-12 LAB — VITAMIN B12: Vitamin B-12: 286 pg/mL (ref 180–914)

## 2021-08-12 LAB — FOLATE: Folate: 8.6 ng/mL (ref >5.9)

## 2021-08-12 MED ORDER — GUAIFENESIN-CODEINE 100-10 MG/5ML PO SOLN: 5.0000 mL | Freq: Four times a day (QID) | ORAL | 0 refills | Status: DC | PRN

## 2021-08-13 ENCOUNTER — Other Ambulatory Visit: Payer: Self-pay | Admitting: Hematology and Oncology

## 2021-08-13 DIAGNOSIS — C770 Secondary and unspecified malignant neoplasm of lymph nodes of head, face and neck: Secondary | ICD-10-CM

## 2021-08-13 DIAGNOSIS — M1711 Unilateral primary osteoarthritis, right knee: Secondary | ICD-10-CM

## 2021-08-13 DIAGNOSIS — M159 Polyosteoarthritis, unspecified: Secondary | ICD-10-CM

## 2021-08-20 ENCOUNTER — Telehealth: Payer: Self-pay

## 2021-08-20 NOTE — Telephone Encounter (Signed)
-----   Message from [name], MD sent at 08/19/2021 10:56 AM EDT ----- Regarding: call Tell her iron level is low so I do rec he take 1 pill daily (OTC)

## 2021-08-20 NOTE — Telephone Encounter (Signed)
Patient notified

## 2021-09-02 ENCOUNTER — Inpatient Hospital Stay: Payer: Medicare Other | Attending: Hematology and Oncology | Admitting: Dietician

## 2021-09-02 NOTE — Progress Notes (Signed)
Nutrition Follow-up:   Called patient on mobile number.  He reports he is feeling well and taking most of his nourishment and all medicine by mouth now.  He only uses his tube 3-4 times a week.  He states his only reason for using his tube is he fears it will clog up if he doesn't use it for feeds.  His PO diet now consists mainly soft foods mashed potatoes, some  soups, can't toleate fried food consistency as he has no teeth.  He uses a combination of Ensure Complete and Ensure plus.  Still using Ensure ONS 3-4 bottles on most days. He has not reported pain with swallowing at this time and did just start with oral iron.     Labs: 08/12/21 Hgb 10.3, Ferritin 90, Iron Sat. 17    Anthropometrics: Weight stable past 4 months 08/09/21   159.7# 07/01/21  159.6# 06/02/21    158# 05/05/21       157# 04/12/21        157.5#  BMI: 24.23   Estimated Energy Needs   Kcals: 1800-2200 Protein: 73-88 grams Fluid: >/= 2200   NUTRITION DIAGNOSIS: Inadequate enteral intake overall related to dysphagia AEB continued weight loss and usual recall of enteral feeds. Resolved taking PO feeds with protein needs being met with ONS   INTERVENTION:  Encouraged use of all supplements orally and only using the tube for FWF daily. Relayed that if he can maintain weight with PO feeds and ONS for a month I could recommend he have his tube removed.  I also cautioned that he would still have to flush tube twice daily with water until it is removed.      MONITORING, EVALUATION, GOAL: PO intake and progression of PO feeds, weight, labs     Next Visit: Telephone consult next month to assess ability to remove tube or at MD or patient request.   April Manson, RDN, LDN Registered Dietitian, [place] (Usual office hours: Tuesday-Thursday) Cell: [phone]

## 2021-09-08 NOTE — Progress Notes (Signed)
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  He has a mass involving the right lateral pharyngeal wall inferior to the tonsillar region and extending to the midline of the vallecular. He completed concurrent chemoradiation with weekly cisplatin in December. He tolerated treatment fairly well, but he has issues with malnutrition and weight loss.  CT scan from February 2023 showed improvement but still accentuated enhancement in the right vallecula and he did follow-up with Dr. Redmond Baseman with ENT with naso-pharyngoscopy, which was negative. He continues to be able to eat more solid foods, but does continue to use his feeding tube daily with Ensure shakes.  The nurses cleaned and dressed his feeding tube.    I will schedule him for a CT of the neck now.  He will see Dr. Melida Quitter for recheck of his throat.  If all is well, we will arrange for removal of his feeding tube.  He will return to clinic in 2 months for evaluation with CBC and CMP.  We will plan to flush his port at that time.  The patient understands the plans discussed today and is in agreement with them.  He knows to contact our office if he develops concerns prior to his next appointment.     [name], MD  [facility] AT [facility] [address] [place]  [address] Dept: [phone] Dept Fax: [phone]   Orders Placed This Encounter  Procedures   CBC and differential    This external order was created through the Results Console.   CBC    This external order was created through the Results Console.   Basic metabolic panel    This external order was created through the Results Console.   Comprehensive metabolic panel    This external order was created through the Results Console.   Hepatic function panel    This external order was created through the Results Console.      CHIEF COMPLAINT:  CC: [name] with history of oropharyngeal cancer here for 4 week evaluation  Current Treatment:  Surveillance  INTERVAL HISTORY:  Tauren is here today for repeat clinical assessment.  He is eating better now and would like to have the feeding tube removed.  Dr. Coralie Keens placed this originally.  He will have Dr. Redmond Baseman recheck his throat and I will schedule a CT of the neck now.  If these evaluations are negative, he will have the feeding tube  removed.  He complains of severe pain of his right knee where he has advanced arthritis and may need a knee replacement.  He denies fevers or chills. He denies pain. His appetite is good. His weight has been stable.  His hemoglobin has improved from 10.3 to 10.9.  CMP is normal.  I have reviewed the past medical history, past surgical history, social history and family history with the patient and they are unchanged from previous note.  ALLERGIES:  has No Known Allergies.  MEDICATIONS:  Current Outpatient Medications  Medication Sig Dispense Refill   atorvastatin (LIPITOR) 40 MG tablet Take 1 tablet by mouth daily.     brimonidine-timolol (COMBIGAN) 0.2-0.5 % ophthalmic solution Place 1 drop into both eyes every 12 (twelve) hours. 15 mL 1   cephALEXin (KEFLEX) 500 MG capsule Take 1 capsule (500 mg total) by mouth 2 (two) times daily. 14 capsule 0   cyanocobalamin 1000 MCG tablet Take by mouth.      ferrous sulfate 325 (65 FE) MG tablet Take 325 mg by mouth daily with breakfast. Informed patient to start taking iron supplement every day. 01/11/21     fluticasone (FLONASE) 50 MCG/ACT nasal spray Place into the nose.     glipiZIDE (GLUCOTROL XL) 5 MG 24 hr tablet Take 1 tablet by mouth daily.     glucose blood (ACCU-CHEK GUIDE) test strip Use as instructed twice per day E11.9 200 each 12   guaiFENesin-codeine 100-10 MG/5ML syrup Take 5 mLs by mouth every 6 (six) hours as needed for cough. 240 mL 0   HYDROcodone-acetaminophen (NORCO) 10-325 MG tablet Take 1 tablet by mouth 3 (three) times daily as needed.     ibuprofen (ADVIL) 800 MG tablet Take 1 tablet (800 mg total) by mouth every 8 (eight) hours as needed. 30 tablet 5   metFORMIN (GLUCOPHAGE) 1000 MG tablet Take 1 tablet (1,000 mg total) by mouth 2 (two) times daily with a meal. 180 tablet 3   montelukast (SINGULAIR) 10 MG tablet Take 1 tablet (10 mg total) by mouth at bedtime. 90 tablet 3   ofloxacin (OCUFLOX) 0.3 % ophthalmic solution      prednisoLONE acetate (PRED FORTE) 1 % ophthalmic suspension SMARTSIG:In Eye(s)     psyllium (METAMUCIL) 58.6 % packet Take 1 packet by mouth daily.     triamcinolone ointment (KENALOG) 0.5 % APPLY TO AFFECTED AREA(S) TWICE DAILY 30 g 2   No current facility-administered medications for this visit.    HISTORY OF PRESENT ILLNESS:   Oncology History Overview Note  Immunohistochemistry for p16 is strongly positive.    Oropharyngeal cancer ([place])  09/10/2020 Initial Diagnosis   He presented with a palpable knot in his upper right neck to Dr. Redmond Baseman on 09/10/20. Patient denied any pain at that time, though did report some difficulty swallowing.     10/20/2020 Pathology Results   FINAL MICROSCOPIC DIAGNOSIS:   A. LYMPH NODE, RIGHT NECK, NEEDLE CORE BIOPSY:  - Metastatic squamous cell carcinoma associated with scanty lymphoid tissue.   10/20/2020 Procedure   Technically successful ultrasound guided  biopsy of indeterminate right neck mass/nodal conglomeration.   10/22/2020 Procedure   The fiberoptic laryngoscope was then placed through the nasal passage to view the pharynx and larynx. After completion, the telescope was removed. Findings included normal nasal passages and no mass or abnormality in the nasopharynx. There is a granular mass involving the right lateral pharyngeal wall inferior to tonsillar area and extending to the midline of the vallecula. The larynx appears normal. Pyriform sinuses are open. Secretions are minimal. Vocal folds are without mass, scarring, or ulceration. The vocal folds adduct and abduct symmetrically. There is good glottal closure. Muscle tension patterns are not present. Laryngeal edema is minimal.    11/11/2020 PET scan   1. Hypermetabolic mass in the RIGHT hypopharynx localizing to the RIGHT tonsillar pillar and vallecula. 2. Solitary hypermetabolic metastatic lymph node to the RIGHT level II nodal station. 3. No distant metastatic disease.  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[place])   11/16/2020 Cancer Staging   Staging form: Pharynx - HPV-Mediated Oropharynx, AJCC 8th Edition - Clinical stage from 11/16/2020: Stage I (cT2, cN1, cM0, p16+) - Signed by Eppie Gibson, MD on 11/16/2020 Stage prefix: Initial diagnosis   12/22/2020 - 02/01/2021 Chemotherapy   Patient is on Treatment Plan : HEAD/NECK Cisplatin q7d         REVIEW OF SYSTEMS:   Constitutional: Denies fevers, chills or abnormal weight loss Eyes: Denies blurriness of vision Ears, nose, mouth, throat, and face: Denies mucositis or sore throat Respiratory: Denies cough, dyspnea or wheezes Cardiovascular: Denies palpitation, chest discomfort or lower extremity swelling Gastrointestinal:  Denies nausea, heartburn or change in bowel habits Skin: Denies abnormal skin rashes Lymphatics: Denies new lymphadenopathy or easy bruising Neurological:Denies numbness, tingling  or new weaknesses Behavioral/Psych: Mood is stable, no new changes  All other systems were reviewed with the patient and are negative.   VITALS:  Blood pressure 140/76, pulse 72, temperature (!) 97.5 F (36.4 C), temperature source Oral, resp. rate 18, height '5\' 8"'$  (1.727 m), weight 158 lb 11.2 oz (72 kg), SpO2 99 %.  Wt Readings from Last 3 Encounters:  09/09/21 158 lb 11.2 oz (72 kg)  08/12/21 159 lb 11.2 oz (72.4 kg)  07/01/21 159 lb 9.6 oz (72.4 kg)    Body mass index is 24.13 kg/m.  Performance status (ECOG): 1 - Symptomatic but completely ambulatory  PHYSICAL EXAM:   GENERAL:alert, no distress and comfortable SKIN: skin color, texture, turgor are normal, no rashes or significant lesions EYES: normal, Conjunctiva are pink and non-injected, sclera clear OROPHARYNX:no exudate, no erythema and lips, buccal mucosa, and tongue normal  NECK: supple, thyroid normal size, non-tender, without nodularity LYMPH:  no palpable lymphadenopathy in the cervical, axillary or inguinal LUNGS: clear to auscultation and percussion with normal breathing effort HEART: regular rate & rhythm and no murmurs and no lower extremity edema ABDOMEN:abdomen soft, non-tender and normal bowel sounds Musculoskeletal:no cyanosis of digits and no clubbing  NEURO: alert & oriented x 3 with fluent speech, no focal motor/sensory deficits  LABORATORY DATA:  I have reviewed the data as listed    Component Value Date/Time   NA 141 09/09/2021 0000   K 3.9 09/09/2021 0000   CL 105 09/09/2021 0000   CO2 24 (A) 09/09/2021 0000   GLUCOSE 164 (H) 11/20/2020 0800   BUN 20 09/09/2021 0000   CREATININE 0.9 09/09/2021 0000   CREATININE 0.76 11/20/2020 0800   CALCIUM 9.3 09/09/2021 0000   PROT 7.5 04/29/2020 1501   ALBUMIN 4.4 09/09/2021 0000   AST 21 09/09/2021 0000   ALT 17 09/09/2021 0000   ALKPHOS 53 09/09/2021 0000   BILITOT 0.4 04/29/2020 1501   GFRNONAA >60 11/20/2020 0800   GFRAA >60 05/17/2016 2106     No results found for: "SPEP", "UPEP"  Lab Results  Component Value Date   WBC 5.7 09/09/2021   NEUTROABS 4.39 09/09/2021   HGB 10.9 (A) 09/09/2021   HCT 35 (A) 09/09/2021   MCV 83 (A) 03/03/2021   PLT 235 09/09/2021      Chemistry      Component Value Date/Time   NA 141 09/09/2021 0000   K 3.9 09/09/2021 0000   CL 105 09/09/2021 0000   CO2 24 (A) 09/09/2021 0000   BUN 20 09/09/2021 0000   CREATININE 0.9 09/09/2021 0000   CREATININE 0.76 11/20/2020 0800   GLU 73 09/09/2021 0000  Component Value Date/Time   CALCIUM 9.3 09/09/2021 0000   ALKPHOS 53 09/09/2021 0000   AST 21 09/09/2021 0000   ALT 17 09/09/2021 0000   BILITOT 0.4 04/29/2020 1501       RADIOGRAPHIC STUDIES: No results found.

## 2021-09-09 ENCOUNTER — Inpatient Hospital Stay: Payer: Medicare Other

## 2021-09-09 ENCOUNTER — Inpatient Hospital Stay (INDEPENDENT_AMBULATORY_CARE_PROVIDER_SITE_OTHER): Payer: Medicare Other | Admitting: Oncology

## 2021-09-09 ENCOUNTER — Encounter: Payer: Self-pay | Admitting: Oncology

## 2021-09-09 ENCOUNTER — Other Ambulatory Visit: Payer: Self-pay | Admitting: Oncology

## 2021-09-09 ENCOUNTER — Encounter: Payer: Self-pay | Admitting: Hematology and Oncology

## 2021-09-09 VITALS — BP 140/76 | HR 72 | Temp 97.5°F | Resp 18 | Ht 68.0 in | Wt 158.7 lb

## 2021-09-09 DIAGNOSIS — C109 Malignant neoplasm of oropharynx, unspecified: Secondary | ICD-10-CM

## 2021-09-09 DIAGNOSIS — C770 Secondary and unspecified malignant neoplasm of lymph nodes of head, face and neck: Secondary | ICD-10-CM | POA: Diagnosis not present

## 2021-09-09 LAB — BASIC METABOLIC PANEL
BUN: 20 (ref 4–21)
CO2: 24 — AB (ref 13–22)
Chloride: 105 (ref 99–108)
Creatinine: 0.9 (ref 0.6–1.3)
Glucose: 73
Potassium: 3.9 mEq/L (ref 3.5–5.1)
Sodium: 141 (ref 137–147)

## 2021-09-09 LAB — CBC AND DIFFERENTIAL
HCT: 35 — AB (ref 41–53)
Hemoglobin: 10.9 — AB (ref 13.5–17.5)
Neutrophils Absolute: 4.39
Platelets: 235 10*3/uL (ref 150–400)
WBC: 5.7

## 2021-09-09 LAB — CBC: RBC: 4.09 (ref 3.87–5.11)

## 2021-09-09 LAB — HEPATIC FUNCTION PANEL
ALT: 17 U/L (ref 10–40)
AST: 21 (ref 14–40)
Alkaline Phosphatase: 53 (ref 25–125)
Bilirubin, Total: 0.4

## 2021-09-09 LAB — COMPREHENSIVE METABOLIC PANEL
Albumin: 4.4 (ref 3.5–5.0)
Calcium: 9.3 (ref 8.7–10.7)

## 2021-09-21 ENCOUNTER — Telehealth: Payer: Self-pay

## 2021-09-21 ENCOUNTER — Telehealth: Payer: Self-pay | Admitting: Oncology

## 2021-09-21 NOTE — Telephone Encounter (Signed)
David Kelly has already taken care these appointments

## 2021-09-21 NOTE — Telephone Encounter (Signed)
09/21/21 Spoke with patient about appts-ct neck on 09/22/21'@1045am'$ .Dr Coralie Keens on 09/29/21'@845am'$ 

## 2021-09-21 NOTE — Telephone Encounter (Signed)
-----   Message from [name], MD sent at 09/20/2021 12:32 PM EDT ----- Regarding: RE: Peg tube Go ahead and ref to Dr. Coralie Keens to remove.  I was going to wait until after eval - supposed to see Dr. Redmond Baseman ENT and get scan done, but he is doing fine. ----- Message ----- From: Georgette Shell, RN Sent: 09/20/2021  11:59 AM EDT To: [name], MD Subject: Peg tube                                       Ms. Palinkas is persistent about getting his PEG tube out, he ended up in the ED this weekend when it fell out on Saturday.  They replaced it with a smaller diameter and it is leaking worse now then before.  Dr. Corena Pilgrim is the surgeon that placed it, Dr. Orlene Erm ordered.

## 2021-09-22 ENCOUNTER — Encounter: Payer: Self-pay | Admitting: Oncology

## 2021-09-23 ENCOUNTER — Encounter: Payer: Self-pay | Admitting: Podiatry

## 2021-09-23 ENCOUNTER — Ambulatory Visit (INDEPENDENT_AMBULATORY_CARE_PROVIDER_SITE_OTHER): Payer: Medicare Other | Admitting: Podiatry

## 2021-09-23 DIAGNOSIS — M79676 Pain in unspecified toe(s): Secondary | ICD-10-CM | POA: Diagnosis not present

## 2021-09-23 DIAGNOSIS — B351 Tinea unguium: Secondary | ICD-10-CM | POA: Diagnosis not present

## 2021-09-23 DIAGNOSIS — E119 Type 2 diabetes mellitus without complications: Secondary | ICD-10-CM

## 2021-09-28 NOTE — Progress Notes (Signed)
  Subjective:  Patient ID: David Kelly, male    DOB: 1945/11/06,  MRN: [id number]  David Kelly presents to clinic today for preventative diabetic foot care and painful elongated mycotic toenails 1-5 bilaterally which are tender when wearing enclosed shoe gear. Pain is relieved with periodic professional debridement.  Patient has h/o oropharyngeal cancer. Currently has feeding tube and port for chemotherapy in place.  Last known HgA1c was unknown.  Patient did not check blood glucose today.  New problem(s): None.   PCP is Biagio Borg, MD , and last visit was  April 22, 2021  No Known Allergies  Review of Systems: Negative except as noted in the HPI.  Objective: No changes noted in today's physical examination. Objective:   Vascular Examination: Vascular status intact b/l with palpable pedal pulses. Pedal hair present b/l. CFT immediate b/l. No edema. No pain with calf compression b/l. Skin temperature gradient WNL b/l.   Neurological Examination: Sensation grossly intact b/l with 10 gram monofilament. Vibratory sensation intact b/l.   Dermatological Examination: Pedal skin with normal turgor, texture and tone b/l. Toenails 1-5 b/l thick, discolored, elongated with subungual debris and pain on dorsal palpation. No hyperkeratotic lesions noted b/l.   Musculoskeletal Examination: Muscle strength 5/5 to b/l LE. HAV with bunion bilaterally and hammertoes 2-5 b/l. Pes planus deformity noted bilateral LE.  Radiographs: None  Assessment/Plan: 1. Pain due to onychomycosis of toenail   2. Diabetes mellitus without complication ([place])   -Patient was evaluated and treated. All patient's and/or POA's questions/concerns answered on today's visit. -Patient currently undergoing chemotherapy for oropharyngeal cancer. -Patient to continue soft, supportive shoe gear daily. -Mycotic toenails 1-5 bilaterally were debrided in length and girth with sterile nail nippers and dremel without  incident. -Patient/POA to call should there be question/concern in the interim.   Return in about 3 months (around 12/24/2021).  Marzetta Board, DPM

## 2021-09-30 ENCOUNTER — Telehealth: Payer: Self-pay

## 2021-09-30 NOTE — Telephone Encounter (Signed)
-----   Message from [name], MD sent at 09/20/2021 12:32 PM EDT ----- Regarding: RE: Peg tube Go ahead and ref to Dr. Coralie Keens to remove.  I was going to wait until after eval - supposed to see Dr. Redmond Baseman ENT and get scan done, but he is doing fine. ----- Message ----- From: Georgette Shell, RN Sent: 09/20/2021  11:59 AM EDT To: [name], MD Subject: Peg tube                                       Ms. Isadore is persistent about getting his PEG tube out, he ended up in the ED this weekend when it fell out on Saturday.  They replaced it with a smaller diameter and it is leaking worse now then before.  Dr. Corena Pilgrim is the surgeon that placed it, Dr. Orlene Erm ordered.

## 2021-10-04 ENCOUNTER — Encounter: Payer: Self-pay | Admitting: Hematology and Oncology

## 2021-10-05 ENCOUNTER — Telehealth: Payer: Self-pay

## 2021-10-05 NOTE — Telephone Encounter (Signed)
-----   Message from [name], MD sent at 10/04/2021  6:30 PM EDT ----- Regarding: feeding tube Did he get his feeding tube removed?

## 2021-10-05 NOTE — Telephone Encounter (Signed)
Feeding tube as been removed.

## 2021-10-07 ENCOUNTER — Inpatient Hospital Stay: Payer: Medicare Other | Attending: Hematology and Oncology | Admitting: Dietician

## 2021-10-07 NOTE — Progress Notes (Signed)
Nutrition Follow-up:   Called patient on mobile number. He completed concurrent chemoradiation with weekly cisplatin in December 2022. Per notes feeding tube has been removed 10/05/21. He still has no teeth, and no home, stays at a shelter a couple times a week to wash up. His PO diet now consists mainly soft foods mashed potatoes, some soups, he says he's eating more vegetables now but meats are still a concern.  He is drinking Ensure products varied ONS 3-4 bottles on most days. He reports taking his iron with his Metformin in the morning usually with his Ensure.  Bowels are usually regular, very rarely some diarrhea, no complaints of constipation.     Labs: 09/09/21 Hgb 10.9 (slight increase)    Anthropometrics: Weight within 2# range past 5 months 09/09/21  158.7# 08/09/21   159.7# 07/01/21  159.6# 06/02/21    158# 05/05/21       157# 04/12/21        157.5#   BMI: 24.13   Estimated Energy Needs   Kcals: 1800-2200 Protein: 73-88 grams Fluid: >/= 2200   NUTRITION DIAGNOSIS: Inadequate enteral intake overall related to dysphagia AEB continued weight loss and usual recall of enteral feeds. Resolved taking PO feeds with protein needs being met with ONS   INTERVENTION:  Encouraged shelf stable soft protein sources like beans, canned chicken, canned tuna.  Let him know I would ask Evelena Peat to put a bag together of canned foods from our pantry. Encouraged taking iron on empty stomach or 2 hours before or after calcium rich foods and ONS.         MONITORING, EVALUATION, GOAL: PO intake, weight, labs     Next Visit: Telephone consult after MD follow up next month then PRN if able to maintain weight and Hgb continues to rise   April Manson, RDN, LDN Registered Dietitian, [place] Part Time Remote (Usual office hours: Tuesday-Thursday)

## 2021-10-12 ENCOUNTER — Encounter: Payer: Self-pay | Admitting: Hematology and Oncology

## 2021-10-24 ENCOUNTER — Encounter: Payer: Self-pay | Admitting: Cardiology

## 2021-10-24 DIAGNOSIS — I6389 Other cerebral infarction: Secondary | ICD-10-CM

## 2021-10-26 ENCOUNTER — Telehealth: Payer: Self-pay

## 2021-10-26 NOTE — Telephone Encounter (Signed)
Transition Care Management Unsuccessful Follow-up Telephone Call  Date of discharge and from where:  9/10/2023[facility]  Attempts:  1st Attempt  Reason for unsuccessful TCM follow-up call:  Left voice message

## 2021-10-27 NOTE — Telephone Encounter (Signed)
Transition Care Management Follow-up Telephone Call Date of discharge and from where: 10/24/2021 from [facility] How have you been since you were released from the hospital? Patient is ok but feeling lousy but thinks he'll be fine if he rests Any questions or concerns? No  Items Reviewed: Did the pt receive and understand the discharge instructions provided? Yes  Medications obtained and verified? Yes , but left eyedrops and is currently waiting to retrieve it. Other?    Any new allergies since your discharge? No  Dietary orders reviewed? No Do you have support at home? Yes   Home Care and Equipment/Supplies: Were home health services ordered? no If so, what is the name of the agency?  Has the agency set up a time to come to the patient's home? not applicable Were any new equipment or medical supplies ordered?  No What is the name of the medical supply agency?  Were you able to get the supplies/equipment? no Do you have any questions related to the use of the equipment or supplies? No  Functional Questionnaire: (I = Independent and D = Dependent) ADLs:   Bathing/Dressing-   Meal Prep-   Eating-   Maintaining continence-   Transferring/Ambulation-   Managing Meds-   Follow up appointments reviewed:  PCP Hospital f/u appt confirmed? No  Scheduled to see  Specialist Hospital f/u appt confirmed? No  Scheduled to see on  Are transportation arrangements needed?  If their condition worsens, is the pt aware to call PCP or go to the Emergency Dept.? No Was the patient provided with contact information for the PCP's office or ED? No Was to pt encouraged to call back with questions or concerns? No

## 2021-10-29 ENCOUNTER — Inpatient Hospital Stay: Payer: Medicare Other | Admitting: Internal Medicine

## 2021-11-10 ENCOUNTER — Other Ambulatory Visit: Payer: Self-pay | Admitting: Oncology

## 2021-11-10 ENCOUNTER — Encounter: Payer: Self-pay | Admitting: Oncology

## 2021-11-10 ENCOUNTER — Inpatient Hospital Stay: Payer: Medicare Other | Attending: Hematology and Oncology | Admitting: Oncology

## 2021-11-10 ENCOUNTER — Inpatient Hospital Stay: Payer: Medicare Other

## 2021-11-10 VITALS — BP 120/61 | HR 88 | Temp 97.9°F | Resp 15 | Ht 68.0 in | Wt 160.9 lb

## 2021-11-10 DIAGNOSIS — C770 Secondary and unspecified malignant neoplasm of lymph nodes of head, face and neck: Secondary | ICD-10-CM

## 2021-11-10 DIAGNOSIS — Z79899 Other long term (current) drug therapy: Secondary | ICD-10-CM | POA: Diagnosis not present

## 2021-11-10 DIAGNOSIS — C140 Malignant neoplasm of pharynx, unspecified: Secondary | ICD-10-CM | POA: Insufficient documentation

## 2021-11-10 DIAGNOSIS — D539 Nutritional anemia, unspecified: Secondary | ICD-10-CM

## 2021-11-10 DIAGNOSIS — C109 Malignant neoplasm of oropharynx, unspecified: Secondary | ICD-10-CM | POA: Diagnosis not present

## 2021-11-10 DIAGNOSIS — J209 Acute bronchitis, unspecified: Secondary | ICD-10-CM

## 2021-11-10 LAB — CBC AND DIFFERENTIAL
HCT: 28 — AB (ref 41–53)
Hemoglobin: 9.1 — AB (ref 13.5–17.5)
Neutrophils Absolute: 5
Platelets: 276 10*3/uL (ref 150–400)
WBC: 6.1

## 2021-11-10 LAB — HEPATIC FUNCTION PANEL
ALT: 18 U/L (ref 10–40)
AST: 26 (ref 14–40)
Alkaline Phosphatase: 56 (ref 25–125)
Bilirubin, Total: 0.3

## 2021-11-10 LAB — BASIC METABOLIC PANEL
BUN: 16 (ref 4–21)
CO2: 25 — AB (ref 13–22)
Chloride: 103 (ref 99–108)
Creatinine: 0.7 (ref 0.6–1.3)
Glucose: 160
Potassium: 3.7 mEq/L (ref 3.5–5.1)
Sodium: 136 — AB (ref 137–147)

## 2021-11-10 LAB — IRON AND TIBC
Iron: 54 ug/dL (ref 45–182)
Saturation Ratios: 14 % — ABNORMAL LOW (ref 17.9–39.5)
TIBC: 376 ug/dL (ref 250–450)
UIBC: 322 ug/dL

## 2021-11-10 LAB — FERRITIN: Ferritin: 40 ng/mL (ref 24–336)

## 2021-11-10 LAB — CBC: RBC: 3.24 — AB (ref 3.87–5.11)

## 2021-11-10 LAB — COMPREHENSIVE METABOLIC PANEL
Albumin: 4.5 (ref 3.5–5.0)
Calcium: 9.5 (ref 8.7–10.7)

## 2021-11-10 MED ORDER — HEPARIN SOD (PORK) LOCK FLUSH 100 UNIT/ML IV SOLN: 500.0000 [IU] | Freq: Once | INTRAVENOUS | Status: DC | PRN

## 2021-11-10 MED ORDER — AZITHROMYCIN 250 MG PO TABS: ORAL_TABLET | ORAL | 0 refills | Status: DC

## 2021-11-10 MED ORDER — SODIUM CHLORIDE 0.9% FLUSH: 10.0000 mL | Freq: Once | INTRAVENOUS | Status: DC | PRN

## 2021-11-10 NOTE — Progress Notes (Signed)
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  He has a mass involving the right lateral pharyngeal wall inferior to the tonsillar region and extending to the midline of the vallecular. He completed concurrent chemoradiation with weekly cisplatin in December. He tolerated treatment fairly well, but he has issues with malnutrition and weight loss.  CT scan from February 2023 showed improvement but still accentuated enhancement in the right vallecula and he did follow-up with Dr. Redmond Baseman with ENT with naso-pharyngoscopy, which was negative.  Acute bronchitis This is associated with severe bilateral wheezing and increased cough.  I will prescribe a Z-Pak for him.  Worsening anemia His hemoglobin is down to 9.1 and I suspect this is recurrent iron deficiency.  I will evaluate his iron studies today and arrange for IV iron if indicated.  Recent CVA September 2023 He has evidence of both an acute and old infarctions of the brain.  He is now on Plavix and aspirin 81 mg.  Osteoarthritis We already know he has severe changes of his knee and needs a knee replacement.  I think the problems with his right shoulder are also arthritic in nature.  Homeless His social situation is poor and he has no place to live.  He does have some relatives that occasionally allow him to sleep on their couch but he often  sleeps in the car.  He is aware of the [employer] and has been encouraged to place his application.  He tells me he has done so and is waiting.   His feeding tube has now been removed and he is eating fairly well.  I will check iron studies and he will likely need IV iron.  We will call him on that.  I will place him on Zithromax for his acute bronchitis.  We gave him samples and coupons for Ensure.  He will return to clinic in 2-3 months for evaluation with CBC and CMP.  He may have his port removed and I will let Dr. Coralie Keens know that will be okay.  We will plan to flush his port at that time.  The patient understands the plans discussed today and is in agreement with them.  He knows to contact our office if he develops concerns prior to his next appointment.     [name], MD  [facility] [address] [place] [address] Dept: [phone] Dept Fax: [phone]   Orders Placed This Encounter  Procedures   CBC and differential    This external order was created through the Results Console.   CBC    This external order was created through the Results Console.   Basic metabolic panel    This external order was created through the Results Console.   Comprehensive metabolic panel    This external order was created through the Results Console.   Hepatic function panel    This external order was created through  the Results Console.      CHIEF COMPLAINT:  CC: [name] with history of oropharyngeal cancer here for 4 week evaluation  Current Treatment:  Surveillance  INTERVAL HISTORY:  David Kelly is here today for repeat clinical assessment.  He is eating better now and had the feeding tube removed. He had a CT of the neck in August which was clear.  He still has a poor social situation and is homeless with no place to live.  He is getting concerned now that the weather is turning cooler of  where he will live.  I think he can have his port removed now.  He was hospitalized 2 weeks ago with a stroke but has recovered.  He is now on Plavix.  MRI from that admission reveals an acute/subacute nonhemorrhagic infarction of the left caudate and superior lentiform nucleus with a remote anterior right frontal lobe cortical infarct and moderate diffuse periventricular white matter disease consistent with chronic microvascular ischemia.  He complains of a cough which occasionally produces sputum and increasing dyspnea and wheezing.  Also during that hospitalization he had an echocardiogram which looks good with normal left ventricular function and an ejection fraction of 55 to 60%.  Dr. Hyacinth Meeker oligo recently did a shoulder x-ray which does show evidence of a probable old fracture of the left clavicle and some chronic degenerative changes.  He denies fevers or chills. He denies pain. His appetite is good. His weight has increased 2 pounds since his last visit 2 months ago.  However his hemoglobin has has dropped from 10.9 to 9.1 with an MCV of 85.  I will evaluate his iron studies and he will likely need IV iron.  At this time I am not sure he is a good candidate for an aggressive GI evaluation in view of the recent stroke and overall poor performance status.  I think he could have his port removed at any time..  CMP is normal other than a nonfasting blood sugar of 160.  I have reviewed the past medical history, past surgical history, social history and family history with the patient and they are unchanged from previous note.  ALLERGIES:  has No Known Allergies.  MEDICATIONS:  Current Outpatient Medications  Medication Sig Dispense Refill   ASPIRIN LOW DOSE 81 MG tablet Take 81 mg by mouth daily.     atorvastatin (LIPITOR) 40 MG tablet Take 1 tablet by mouth daily.     azithromycin (ZITHROMAX Z-PAK) 250 MG tablet 2 pills today, then 1 pill daily 6 each 0   brimonidine-timolol (COMBIGAN) 0.2-0.5 %  ophthalmic solution Place 1 drop into both eyes every 12 (twelve) hours. 15 mL 1   clopidogrel (PLAVIX) 75 MG tablet Take 75 mg by mouth daily.     cyanocobalamin 1000 MCG tablet Take by mouth.     ferrous sulfate 325 (65 FE) MG tablet Take 325 mg by mouth daily with breakfast. Informed patient to start taking iron supplement every day. 01/11/21     fluticasone (FLONASE) 50 MCG/ACT nasal spray Place into the nose.     glipiZIDE (GLUCOTROL XL) 5 MG 24 hr tablet Take 1 tablet by mouth daily.     glucose blood (ACCU-CHEK GUIDE) test strip Use as instructed twice per day E11.9 200 each 12   guaiFENesin-codeine 100-10 MG/5ML syrup Take 5 mLs by mouth every 6 (six) hours as needed for cough. 240 mL 0   HYDROcodone-acetaminophen (NORCO) 10-325 MG tablet Take 1 tablet by mouth 3 (  three) times daily as needed.     ibuprofen (ADVIL) 800 MG tablet Take 1 tablet (800 mg total) by mouth every 8 (eight) hours as needed. 30 tablet 5   metFORMIN (GLUCOPHAGE) 1000 MG tablet Take 1 tablet (1,000 mg total) by mouth 2 (two) times daily with a meal. 180 tablet 3   montelukast (SINGULAIR) 10 MG tablet Take 1 tablet (10 mg total) by mouth at bedtime. 90 tablet 3   ofloxacin (OCUFLOX) 0.3 % ophthalmic solution      prednisoLONE acetate (PRED FORTE) 1 % ophthalmic suspension SMARTSIG:In Eye(s)     psyllium (METAMUCIL) 58.6 % packet Take 1 packet by mouth daily.     triamcinolone ointment (KENALOG) 0.5 % APPLY TO AFFECTED AREA(S) TWICE DAILY 30 g 2   No current facility-administered medications for this visit.    HISTORY OF PRESENT ILLNESS:   Oncology History Overview Note  Immunohistochemistry for p16 is strongly positive.    Oropharyngeal cancer ([place])  09/10/2020 Initial Diagnosis   He presented with a palpable knot in his upper right neck to Dr. Redmond Baseman on 09/10/20. Patient denied any pain at that time, though did report some difficulty swallowing.     10/20/2020 Pathology Results   FINAL MICROSCOPIC DIAGNOSIS:    A. LYMPH NODE, RIGHT NECK, NEEDLE CORE BIOPSY:  - Metastatic squamous cell carcinoma associated with scanty lymphoid tissue.   10/20/2020 Procedure   Technically successful ultrasound guided biopsy of indeterminate right neck mass/nodal conglomeration.   10/22/2020 Procedure   The fiberoptic laryngoscope was then placed through the nasal passage to view the pharynx and larynx. After completion, the telescope was removed. Findings included normal nasal passages and no mass or abnormality in the nasopharynx. There is a granular mass involving the right lateral pharyngeal wall inferior to tonsillar area and extending to the midline of the vallecula. The larynx appears normal. Pyriform sinuses are open. Secretions are minimal. Vocal folds are without mass, scarring, or ulceration. The vocal folds adduct and abduct symmetrically. There is good glottal closure. Muscle tension patterns are not present. Laryngeal edema is minimal.    11/11/2020 PET scan   1. Hypermetabolic mass in the RIGHT hypopharynx localizing to the RIGHT tonsillar pillar and vallecula. 2. Solitary hypermetabolic metastatic lymph node to the RIGHT level II nodal station. 3. No distant metastatic disease.   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[place])   11/16/2020 Cancer Staging   Staging form: Pharynx - HPV-Mediated Oropharynx, AJCC 8th Edition - Clinical stage from 11/16/2020: Stage I (cT2, cN1, cM0, p16+) - Signed by Eppie Gibson, MD on 11/16/2020 Stage prefix: Initial diagnosis   12/22/2020 - 02/01/2021 Chemotherapy   Patient is on Treatment Plan : HEAD/NECK Cisplatin q7d         REVIEW OF SYSTEMS:   Constitutional: Denies fevers, chills or abnormal weight loss Eyes: Denies blurriness of vision Ears, nose, mouth, throat, and face: Denies mucositis or sore throat Respiratory: Denies cough, dyspnea or wheezes Cardiovascular: Denies palpitation, chest discomfort or lower extremity  swelling Gastrointestinal:  Denies nausea, heartburn or change in bowel habits Skin: Denies abnormal skin rashes Lymphatics: Denies new lymphadenopathy or easy bruising Neurological:Denies numbness, tingling or new weaknesses Behavioral/Psych: Mood is stable, no new changes  All other systems were reviewed with the patient and are negative.   VITALS:  Blood pressure 120/61, pulse 88, temperature 97.9 F (36.6 C), temperature source Oral, resp. rate 15, height '5\' 8"'$  (1.727 m), weight 160 lb 14.4 oz (73 kg), SpO2  100 %.  Wt Readings from Last 3 Encounters:  11/17/21 161 lb 12 oz (73.4 kg)  11/10/21 160 lb 14.4 oz (73 kg)  09/09/21 158 lb 11.2 oz (72 kg)    Body mass index is 24.46 kg/m.  Performance status (ECOG): 1 - Symptomatic but completely ambulatory  PHYSICAL EXAM:   GENERAL:alert, no distress and comfortable SKIN: skin color, texture, turgor are normal, no rashes or significant lesions EYES: normal, Conjunctiva are pink and non-injected, sclera clear OROPHARYNX:no exudate, no erythema and lips, buccal mucosa, and tongue normal  NECK: supple, thyroid normal size, non-tender, without nodularity LYMPH:  no palpable lymphadenopathy in the cervical, axillary or inguinal LUNGS: clear to auscultation and percussion with normal breathing effort HEART: regular rate & rhythm and no murmurs and no lower extremity edema ABDOMEN:abdomen soft, non-tender and normal bowel sounds Musculoskeletal:no cyanosis of digits and no clubbing  NEURO: alert & oriented x 3 with fluent speech, no focal motor/sensory deficits  LABORATORY DATA:  I have reviewed the data as listed    Component Value Date/Time   NA 136 (A) 11/10/2021 0000   K 3.7 11/10/2021 0000   CL 103 11/10/2021 0000   CO2 25 (A) 11/10/2021 0000   GLUCOSE 164 (H) 11/20/2020 0800   BUN 16 11/10/2021 0000   CREATININE 0.7 11/10/2021 0000   CREATININE 0.76 11/20/2020 0800   CALCIUM 9.5 11/10/2021 0000   PROT 7.5 04/29/2020 1501    ALBUMIN 4.5 11/10/2021 0000   AST 26 11/10/2021 0000   ALT 18 11/10/2021 0000   ALKPHOS 56 11/10/2021 0000   BILITOT 0.4 04/29/2020 1501   GFRNONAA >60 11/20/2020 0800   GFRAA >60 05/17/2016 2106    No results found for: "SPEP", "UPEP"  Lab Results  Component Value Date   WBC 6.1 11/10/2021   NEUTROABS 5.00 11/10/2021   HGB 9.1 (A) 11/10/2021   HCT 28 (A) 11/10/2021   MCV 83 (A) 03/03/2021   PLT 276 11/10/2021      Chemistry      Component Value Date/Time   NA 136 (A) 11/10/2021 0000   K 3.7 11/10/2021 0000   CL 103 11/10/2021 0000   CO2 25 (A) 11/10/2021 0000   BUN 16 11/10/2021 0000   CREATININE 0.7 11/10/2021 0000   CREATININE 0.76 11/20/2020 0800   GLU 160 11/10/2021 0000      Component Value Date/Time   CALCIUM 9.5 11/10/2021 0000   ALKPHOS 56 11/10/2021 0000   AST 26 11/10/2021 0000   ALT 18 11/10/2021 0000   BILITOT 0.4 04/29/2020 1501       RADIOGRAPHIC STUDIES: No results found.

## 2021-11-11 ENCOUNTER — Telehealth: Payer: Self-pay | Admitting: Oncology

## 2021-11-11 ENCOUNTER — Other Ambulatory Visit: Payer: Self-pay | Admitting: Pharmacist

## 2021-11-11 NOTE — Telephone Encounter (Signed)
Patient has been scheduled. Aware of appt date and time  Scheduling Message Entered by [facility], Manuela Schwartz M on 11/11/2021 at 11:00 AM Priority: Routine <No visit type provided>  Department: [facility]  Provider:   Appointment Notes:  Please schedule pt for 2 doses of feraheme 4 to 8 days apart.  Scheduling Notes:

## 2021-11-11 NOTE — Addendum Note (Signed)
Addended by: Juanetta Beets on: 11/11/2021 11:14 AM   Modules accepted: Orders

## 2021-11-14 ENCOUNTER — Encounter: Payer: Self-pay | Admitting: Oncology

## 2021-11-14 DIAGNOSIS — K921 Melena: Secondary | ICD-10-CM | POA: Diagnosis not present

## 2021-11-14 DIAGNOSIS — J449 Chronic obstructive pulmonary disease, unspecified: Secondary | ICD-10-CM | POA: Diagnosis not present

## 2021-11-14 DIAGNOSIS — E7800 Pure hypercholesterolemia, unspecified: Secondary | ICD-10-CM | POA: Diagnosis not present

## 2021-11-16 ENCOUNTER — Encounter: Payer: Self-pay | Admitting: Oncology

## 2021-11-16 MED FILL — Ferumoxytol Inj 510 MG/17ML (30 MG/ML) (Elemental Fe): INTRAVENOUS | Qty: 17 | Status: AC

## 2021-11-17 ENCOUNTER — Inpatient Hospital Stay: Payer: Medicare Other | Attending: Oncology

## 2021-11-17 ENCOUNTER — Ambulatory Visit: Payer: Medicare Other | Admitting: Dietician

## 2021-11-17 VITALS — BP 123/74 | HR 79 | Temp 97.1°F | Resp 20 | Ht 68.0 in | Wt 161.8 lb

## 2021-11-17 DIAGNOSIS — C109 Malignant neoplasm of oropharynx, unspecified: Secondary | ICD-10-CM

## 2021-11-17 DIAGNOSIS — Z23 Encounter for immunization: Secondary | ICD-10-CM | POA: Insufficient documentation

## 2021-11-17 DIAGNOSIS — C140 Malignant neoplasm of pharynx, unspecified: Secondary | ICD-10-CM | POA: Diagnosis not present

## 2021-11-17 DIAGNOSIS — D509 Iron deficiency anemia, unspecified: Secondary | ICD-10-CM | POA: Diagnosis not present

## 2021-11-17 MED ORDER — SODIUM CHLORIDE 0.9 % IV SOLN: Freq: Once | INTRAVENOUS | Status: DC | PRN

## 2021-11-17 MED ORDER — INFLUENZA VAC SPLIT QUAD 0.5 ML IM SUSY
0.5000 mL | PREFILLED_SYRINGE | Freq: Once | INTRAMUSCULAR | Status: AC
  Administered 2021-11-17: 0.5 mL via INTRAMUSCULAR
  Filled 2021-11-17: qty 0.5

## 2021-11-17 MED ORDER — SODIUM CHLORIDE 0.9% FLUSH
10.0000 mL | Freq: Once | INTRAVENOUS | Status: AC | PRN
  Administered 2021-11-17: 10 mL

## 2021-11-17 MED ORDER — SODIUM CHLORIDE 0.9 % IV SOLN
510.0000 mg | Freq: Once | INTRAVENOUS | Status: AC
  Administered 2021-11-17: 510 mg via INTRAVENOUS
  Filled 2021-11-17: qty 510

## 2021-11-17 MED ORDER — HEPARIN SOD (PORK) LOCK FLUSH 100 UNIT/ML IV SOLN
500.0000 [IU] | Freq: Once | INTRAVENOUS | Status: AC | PRN
  Administered 2021-11-17: 500 [IU]

## 2021-11-17 NOTE — Progress Notes (Signed)
Nutrition Follow-up:   Called patient on mobile number. Nutrition follow up post treatment to check on PO intake after feeding tube removed.  He has had admission to [facility] for CV 9/8-9/10/23 and is being follow by [facility] as OP.  He declined referral to speech, but is being followed with cardiac halter monitor.  Patient also reports being admitted last Friday-Sunday for bleeding hemorrhoid.  He is coming today for IV ferriheme.  He hasn't been taking PO iron on empty stomach "doesn't like to take pills on empty stomach." Little change in PO.  Still no teeth so only able to take soft foods.  He reports no trouble swallowing or pain with eating. Bowels are regular now he did report prior to going to ER had bloody stools.    PO continues: Fruit, veggies, eggs and grits, canned beans and 2 bottle ONS daily.      Labs: 11/10/21 Hgb 9.1   Anthropometrics: Weight stable 11/10/21  160.9# 09/09/21  158.7# 08/09/21   159.7# 07/01/21  159.6# BMI: 24.46   Estimated Energy Needs    Kcals: 1800-2200 Protein: 73-88 grams Fluid: >/= 2200   NUTRITION DIAGNOSIS: Inadequate enteral intake overall related to dysphagia AEB continued weight loss and usual recall of enteral feeds. Resolved taking PO feeds with protein needs being met with PO and ONS   INTERVENTION:  Encouraged shelf stable soft protein sources like beans, canned chicken, canned tuna.  Encouraged taking iron  2 hours before or after calcium rich foods and ONS.   Told patient to ask for Boost HP samples when he goes for his infusion today.      MONITORING, EVALUATION, GOAL: PO intake, weight, labs     Next Visit: Telephone consult after MD follow up and labs in December, encouraged to call if he has any concerns with his nutritional intake.    April Manson, RDN, LDN Registered Dietitian, [place] Part Time Remote (Usual office hours: Tuesday-Thursday)

## 2021-11-17 NOTE — Patient Instructions (Signed)
Iron Deficiency Anemia, Adult  Iron deficiency anemia is a condition in which the concentration of red blood cells or hemoglobin in the blood is below normal because of too little iron. Hemoglobin is a substance in red blood cells that carries oxygen to the body's tissues. When the concentration of red blood cells or hemoglobin is too low, not enough oxygen reaches these tissues. Iron deficiency anemia is usually long-lasting, and it develops over time. It may or may not cause symptoms. It is a common type of anemia. What are the causes? This condition may be caused by: Not enough iron in the diet. Abnormal absorption in the gut. Blood loss. What increases the risk? You are more likely to develop this condition if you get menstrual periods (menstruate) or are pregnant. What are the signs or symptoms? Symptoms of this condition may include: Pale skin, lips, and nail beds. Weakness, dizziness, and getting tired easily. Shortness of breath when moving or exercising. Cold hands or feet. Mild anemia may not cause any symptoms. How is this diagnosed? This condition is diagnosed based on: Your medical history. A physical exam. Blood tests. How is this treated? This condition is treated by correcting the cause of your iron deficiency. Treatment may involve: Adding iron-rich foods to your diet. Taking iron supplements. If you are pregnant or breastfeeding, you may need to take extra iron because your normal diet usually does not provide the amount of iron that you need. Increasing vitamin C intake. Vitamin C helps your body absorb iron. Your health care provider may recommend that you take iron supplements along with a glass of orange juice or a vitamin C supplement. Medicines to make heavy menstrual flow lighter. Surgery or additional testing procedures to determine the cause of your anemia. You may need repeat blood tests to determine whether treatment is working. If the treatment does not  seem to be working, you may need more tests. Follow these instructions at home: Medicines Take over-the-counter and prescription medicines only as told by your health care provider. This includes iron supplements and vitamins. This is important because too much iron can be harmful. For the best iron absorption, you should take iron supplements when your stomach is empty. If you cannot tolerate them on an empty stomach, you may need to take them with food. Do not drink milk or take antacids at the same time as your iron supplements. Milk and antacids may interfere with how your body absorbs iron. Iron supplements may turn stool (feces) a darker color and it may appear black. If you cannot tolerate taking iron supplements by mouth, talk with your health care provider about taking them through an IV or through an injection into a muscle. Eating and drinking Talk with your health care provider before changing your diet. Your provider may recommend that you eat foods that contain a lot of iron, such as: Liver. Low-fat (lean) beef. Breads and cereals that have iron added to them (are fortified). Eggs. Dried fruit. Dark green, leafy vegetables. To help your body use the iron from iron-rich foods, eat those foods at the same time as fresh fruits and vegetables that are high in vitamin C. Foods that are high in vitamin C include: Oranges. Peppers. Tomatoes. Mangoes. Managing constipation If you are taking an iron supplement, it may cause constipation. To prevent or treat constipation, you may need to: Drink enough fluid to keep your urine pale yellow. Take over-the-counter or prescription medicines. Eat foods that are high in fiber, such   as beans, whole grains, and fresh fruits and vegetables. Limit foods that are high in fat and processed sugars, such as fried or sweet foods. General instructions Return to your normal activities as told by your health care provider. Ask your health care provider  what activities are safe for you. Keep all follow-up visits. Contact a health care provider if: You feel nauseous or you vomit. You feel weak. You become light-headed when getting up from a sitting or lying down position. You have unexplained sweating. You develop symptoms of constipation. You have a heaviness in your chest. You have trouble breathing with physical activity. Get help right away if: You faint. If this happens, do not drive yourself to the hospital. You have an irregular or rapid heartbeat. Summary Iron deficiency anemia is a condition in which the concentration of red blood cells or hemoglobin in the blood is below normal because of too little iron. This condition is treated by correcting the cause of your iron deficiency. Take over-the-counter and prescription medicines only as told by your health care provider. This includes iron supplements and vitamins. To help your body use the iron from iron-rich foods, eat those foods at the same time as fresh fruits and vegetables that are high in vitamin C. Seek medical help if you have signs or symptoms of worsening anemia. This information is not intended to replace advice given to you by your health care provider. Make sure you discuss any questions you have with your health care provider. Document Revised: 03/10/2021 Document Reviewed: 03/10/2021 Elsevier Patient Education  2023 [employer] Iron-Rich Diet  Iron is a mineral that helps your body produce hemoglobin. Hemoglobin is a protein in red blood cells that carries oxygen to your body's tissues. Eating too little iron may cause you to feel weak and tired, and it can increase your risk of infection. Iron is naturally found in many foods, and many foods have iron added to them (are iron-fortified). You may need to follow an iron-rich diet if you do not have enough iron in your body due to certain medical conditions. The amount of iron that you need each day depends on your  age, your sex, and any medical conditions you have. Follow instructions from your health care provider or a dietitian about how much iron you should eat each day. What are tips for following this plan? Reading food labels Check food labels to see how many milligrams (mg) of iron are in each serving. Cooking Cook foods in pots and pans that are made from iron. Take these steps to make it easier for your body to absorb iron from certain foods: Soak beans overnight before cooking. Soak whole grains overnight and drain them before using. Ferment flours before baking, such as by using yeast in bread dough. Meal planning When you eat foods that contain iron, you should eat them with foods that are high in vitamin C. These include oranges, peppers, tomatoes, potatoes, and mangoes. Vitamin C helps your body absorb iron. Certain foods and drinks prevent your body from absorbing iron properly. Avoid eating these foods in the same meal as iron-rich foods or with iron supplements. These foods include: Coffee, black tea, and red wine. Milk, dairy products, and foods that are high in calcium. Beans and soybeans. Whole grains. General information Take iron supplements only as told by your health care provider. An overdose of iron can be life-threatening. If you were prescribed iron supplements, take them with orange juice or a vitamin C   supplement. When you eat iron-fortified foods or take an iron supplement, you should also eat foods that naturally contain iron, such as meat, poultry, and fish. Eating naturally iron-rich foods helps your body absorb the iron that is added to other foods or contained in a supplement. Iron from animal sources is better absorbed than iron from plant sources. What foods should I eat? Fruits Prunes. Raisins. Eat fruits high in vitamin C, such as oranges, grapefruits, and strawberries, with iron-rich foods. Vegetables Spinach (cooked). Green peas. Broccoli. Fermented  vegetables. Eat vegetables high in vitamin C, such as leafy greens, potatoes, bell peppers, and tomatoes, with iron-rich foods. Grains Iron-fortified breakfast cereal. Iron-fortified whole-wheat bread. Enriched rice. Sprouted grains. Meats and other proteins Beef liver. Beef. [place]. Chicken. Oysters. Shrimp. [place]. Sardines. Chickpeas. Nuts. Tofu. Pumpkin seeds. Beverages Tomato juice. Fresh orange juice. Prune juice. Hibiscus tea. Iron-fortified instant breakfast shakes. Sweets and desserts Blackstrap molasses. Seasonings and condiments Tahini. Fermented soy sauce. Other foods Wheat germ. The items listed above may not be a complete list of recommended foods and beverages. Contact a dietitian for more information. What foods should I limit? These are foods that should be limited while eating iron-rich foods as they can reduce the absorption of iron in your body. Grains Whole grains. Bran cereal. Bran flour. Meats and other proteins Soybeans. Products made from soy protein. Black beans. Lentils. Mung beans. Split peas. Dairy Milk. Cream. Cheese. Yogurt. Cottage cheese. Beverages Coffee. Black tea. Red wine. Sweets and desserts Cocoa. Chocolate. Ice cream. Seasonings and condiments Basil. Oregano. Large amounts of parsley. The items listed above may not be a complete list of foods and beverages you should limit. Contact a dietitian for more information. Summary Iron is a mineral that helps your body produce hemoglobin. Hemoglobin is a protein in red blood cells that carries oxygen to your body's tissues. Iron is naturally found in many foods, and many foods have iron added to them (are iron-fortified). When you eat foods that contain iron, you should eat them with foods that are high in vitamin C. Vitamin C helps your body absorb iron. Certain foods and drinks prevent your body from absorbing iron properly, such as whole grains and dairy products. You should avoid eating these foods  in the same meal as iron-rich foods or with iron supplements. This information is not intended to replace advice given to you by your health care provider. Make sure you discuss any questions you have with your health care provider. Document Revised: 01/13/2020 Document Reviewed: 01/13/2020 Elsevier Patient Education  [place]. Influenza Virus Vaccine injection What is this medication? INFLUENZA VIRUS VACCINE (in floo EN zuh VAHY ruhs vak SEEN) helps to reduce the risk of getting influenza also known as the flu. The vaccine only helps protect you against some strains of the flu. This medicine may be used for other purposes; ask your health care provider or pharmacist if you have questions. COMMON BRAND NAME(S): Afluria, Afluria Quadrivalent, Agriflu, Alfuria, FLUAD, FLUAD Quadrivalent, Fluarix, Fluarix Quadrivalent, Flublok, Flublok Quadrivalent, FLUCELVAX, FLUCELVAX Quadrivalent, Flulaval, Flulaval Quadrivalent, Fluvirin, Fluzone, Fluzone High-Dose, Fluzone Intradermal, Fluzone Quadrivalent What should I tell my care team before I take this medication? They need to know if you have any of these conditions: bleeding disorder like hemophilia fever or infection Guillain-Barre syndrome or other neurological problems immune system problems infection with the human immunodeficiency virus (HIV) or AIDS low blood platelet counts multiple sclerosis an unusual or allergic reaction to influenza virus vaccine, latex, other medicines, foods,  dyes, or preservatives. Different brands of vaccines contain different allergens. Some may contain latex or eggs. Talk to your doctor about your allergies to make sure that you get the right vaccine. pregnant or trying to get pregnant breast-feeding How should I use this medication? This vaccine is for injection into a muscle or under the skin. It is given by a health care professional. A copy of Vaccine Information Statements will be given before each  vaccination. Read this sheet carefully each time. The sheet may change frequently. Talk to your healthcare provider to see which vaccines are right for you. Some vaccines should not be used in all age groups. Overdosage: If you think you have taken too much of this medicine contact a poison control center or emergency room at once. NOTE: This medicine is only for you. Do not share this medicine with others. What if I miss a dose? This does not apply. What may interact with this medication? chemotherapy or radiation therapy medicines that lower your immune system like etanercept, anakinra, infliximab, and adalimumab medicines that treat or prevent blood clots like warfarin phenytoin steroid medicines like prednisone or cortisone theophylline vaccines This list may not describe all possible interactions. Give your health care provider a list of all the medicines, herbs, non-prescription drugs, or dietary supplements you use. Also tell them if you smoke, drink alcohol, or use illegal drugs. Some items may interact with your medicine. What should I watch for while using this medication? Report any side effects that do not go away within 3 days to your doctor or health care professional. Call your health care provider if any unusual symptoms occur within 6 weeks of receiving this vaccine. You may still catch the flu, but the illness is not usually as bad. You cannot get the flu from the vaccine. The vaccine will not protect against colds or other illnesses that may cause fever. The vaccine is needed every year. What side effects may I notice from receiving this medication? Side effects that you should report to your doctor or health care professional as soon as possible: allergic reactions like skin rash, itching or hives, swelling of the face, lips, or tongue Side effects that usually do not require medical attention (report to your doctor or health care professional if they continue or are  bothersome): fever headache muscle aches and pains pain, tenderness, redness, or swelling at the injection site tiredness This list may not describe all possible side effects. Call your doctor for medical advice about side effects. You may report side effects to FDA at 1-800-FDA-1088. Where should I keep my medication? The vaccine will be given by a health care professional in a clinic, pharmacy, doctor's office, or other health care setting. You will not be given vaccine doses to store at home. NOTE: This sheet is a summary. It may not cover all possible information. If you have questions about this medicine, talk to your doctor, pharmacist, or health care provider.  2023 Elsevier/[employer] Standard (2020-09-04 00:00:00)

## 2021-11-23 MED FILL — Ferumoxytol Inj 510 MG/17ML (30 MG/ML) (Elemental Fe): INTRAVENOUS | Qty: 17 | Status: AC

## 2021-11-24 ENCOUNTER — Encounter: Payer: Self-pay | Admitting: Oncology

## 2021-11-24 ENCOUNTER — Inpatient Hospital Stay: Payer: Medicare Other

## 2021-11-24 VITALS — BP 102/66 | HR 70 | Resp 16

## 2021-11-24 DIAGNOSIS — Z23 Encounter for immunization: Secondary | ICD-10-CM | POA: Diagnosis not present

## 2021-11-24 DIAGNOSIS — C109 Malignant neoplasm of oropharynx, unspecified: Secondary | ICD-10-CM

## 2021-11-24 DIAGNOSIS — D509 Iron deficiency anemia, unspecified: Secondary | ICD-10-CM | POA: Diagnosis not present

## 2021-11-24 DIAGNOSIS — C140 Malignant neoplasm of pharynx, unspecified: Secondary | ICD-10-CM | POA: Diagnosis not present

## 2021-11-24 MED ORDER — HEPARIN SOD (PORK) LOCK FLUSH 100 UNIT/ML IV SOLN
500.0000 [IU] | Freq: Once | INTRAVENOUS | Status: AC | PRN
  Administered 2021-11-24: 500 [IU]

## 2021-11-24 MED ORDER — SODIUM CHLORIDE 0.9 % IV SOLN: Freq: Once | INTRAVENOUS | Status: AC

## 2021-11-24 MED ORDER — SODIUM CHLORIDE 0.9% FLUSH
10.0000 mL | Freq: Once | INTRAVENOUS | Status: AC | PRN
  Administered 2021-11-24: 10 mL

## 2021-11-24 MED ORDER — SODIUM CHLORIDE 0.9 % IV SOLN
510.0000 mg | Freq: Once | INTRAVENOUS | Status: AC
  Administered 2021-11-24: 510 mg via INTRAVENOUS
  Filled 2021-11-24: qty 510

## 2021-11-24 NOTE — Patient Instructions (Signed)

## 2021-11-29 DIAGNOSIS — D649 Anemia, unspecified: Secondary | ICD-10-CM | POA: Diagnosis not present

## 2021-12-02 ENCOUNTER — Encounter: Payer: Self-pay | Admitting: Oncology

## 2021-12-02 DIAGNOSIS — Z20822 Contact with and (suspected) exposure to covid-19: Secondary | ICD-10-CM | POA: Diagnosis not present

## 2021-12-02 DIAGNOSIS — R0989 Other specified symptoms and signs involving the circulatory and respiratory systems: Secondary | ICD-10-CM | POA: Diagnosis not present

## 2021-12-02 DIAGNOSIS — R059 Cough, unspecified: Secondary | ICD-10-CM | POA: Diagnosis not present

## 2021-12-07 DIAGNOSIS — E1149 Type 2 diabetes mellitus with other diabetic neurological complication: Secondary | ICD-10-CM | POA: Diagnosis not present

## 2021-12-07 DIAGNOSIS — E785 Hyperlipidemia, unspecified: Secondary | ICD-10-CM | POA: Diagnosis not present

## 2021-12-10 DIAGNOSIS — G8929 Other chronic pain: Secondary | ICD-10-CM | POA: Diagnosis not present

## 2021-12-10 DIAGNOSIS — M545 Low back pain, unspecified: Secondary | ICD-10-CM | POA: Diagnosis not present

## 2021-12-10 DIAGNOSIS — Z79899 Other long term (current) drug therapy: Secondary | ICD-10-CM | POA: Diagnosis not present

## 2021-12-10 DIAGNOSIS — Z79891 Long term (current) use of opiate analgesic: Secondary | ICD-10-CM | POA: Diagnosis not present

## 2021-12-14 DIAGNOSIS — R053 Chronic cough: Secondary | ICD-10-CM | POA: Diagnosis not present

## 2021-12-14 DIAGNOSIS — Z8673 Personal history of transient ischemic attack (TIA), and cerebral infarction without residual deficits: Secondary | ICD-10-CM | POA: Diagnosis not present

## 2021-12-14 DIAGNOSIS — E785 Hyperlipidemia, unspecified: Secondary | ICD-10-CM | POA: Diagnosis not present

## 2021-12-14 DIAGNOSIS — E1149 Type 2 diabetes mellitus with other diabetic neurological complication: Secondary | ICD-10-CM | POA: Diagnosis not present

## 2021-12-14 DIAGNOSIS — Z6823 Body mass index (BMI) 23.0-23.9, adult: Secondary | ICD-10-CM | POA: Diagnosis not present

## 2021-12-16 ENCOUNTER — Encounter: Payer: Self-pay | Admitting: Oncology

## 2021-12-21 ENCOUNTER — Encounter: Payer: Self-pay | Admitting: Oncology

## 2021-12-21 NOTE — Telephone Encounter (Signed)
err

## 2021-12-29 ENCOUNTER — Encounter: Payer: Self-pay | Admitting: Oncology

## 2022-01-05 DIAGNOSIS — K2100 Gastro-esophageal reflux disease with esophagitis, without bleeding: Secondary | ICD-10-CM | POA: Diagnosis not present

## 2022-01-05 DIAGNOSIS — K5731 Diverticulosis of large intestine without perforation or abscess with bleeding: Secondary | ICD-10-CM | POA: Diagnosis not present

## 2022-01-05 DIAGNOSIS — C140 Malignant neoplasm of pharynx, unspecified: Secondary | ICD-10-CM | POA: Diagnosis not present

## 2022-01-05 DIAGNOSIS — D649 Anemia, unspecified: Secondary | ICD-10-CM | POA: Diagnosis not present

## 2022-01-10 DIAGNOSIS — Z79891 Long term (current) use of opiate analgesic: Secondary | ICD-10-CM | POA: Diagnosis not present

## 2022-01-10 DIAGNOSIS — M545 Low back pain, unspecified: Secondary | ICD-10-CM | POA: Diagnosis not present

## 2022-01-10 DIAGNOSIS — Z79899 Other long term (current) drug therapy: Secondary | ICD-10-CM | POA: Diagnosis not present

## 2022-01-10 DIAGNOSIS — M25561 Pain in right knee: Secondary | ICD-10-CM | POA: Diagnosis not present

## 2022-01-10 DIAGNOSIS — G8929 Other chronic pain: Secondary | ICD-10-CM | POA: Diagnosis not present

## 2022-01-13 ENCOUNTER — Ambulatory Visit (INDEPENDENT_AMBULATORY_CARE_PROVIDER_SITE_OTHER): Payer: Medicare Other | Admitting: Podiatry

## 2022-01-13 ENCOUNTER — Encounter: Payer: Self-pay | Admitting: Podiatry

## 2022-01-13 ENCOUNTER — Encounter: Payer: Self-pay | Admitting: Oncology

## 2022-01-13 DIAGNOSIS — M79676 Pain in unspecified toe(s): Secondary | ICD-10-CM

## 2022-01-13 DIAGNOSIS — B351 Tinea unguium: Secondary | ICD-10-CM | POA: Diagnosis not present

## 2022-01-13 DIAGNOSIS — M2011 Hallux valgus (acquired), right foot: Secondary | ICD-10-CM | POA: Diagnosis not present

## 2022-01-13 DIAGNOSIS — M2042 Other hammer toe(s) (acquired), left foot: Secondary | ICD-10-CM

## 2022-01-13 DIAGNOSIS — M2012 Hallux valgus (acquired), left foot: Secondary | ICD-10-CM | POA: Diagnosis not present

## 2022-01-13 DIAGNOSIS — M2041 Other hammer toe(s) (acquired), right foot: Secondary | ICD-10-CM | POA: Diagnosis not present

## 2022-01-13 DIAGNOSIS — E119 Type 2 diabetes mellitus without complications: Secondary | ICD-10-CM

## 2022-01-13 NOTE — Progress Notes (Signed)
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  He has a mass involving the right lateral pharyngeal wall inferior to the tonsillar region and extending to the midline of the vallecular. He completed concurrent chemoradiation with weekly cisplatin in December. He tolerated treatment fairly well, but he has issues with malnutrition and weight loss.  CT scan from February 2023 showed improvement but still accentuated enhancement in the right vallecula and he did follow-up with Dr. Redmond Baseman with ENT with naso-pharyngoscopy, which was negative. Repeat scan in August of 2023 remained clear and we will repeat it in 6 months.   Acute bronchitis This is associated with severe bilateral wheezing and increased cough. I will prescribe a Z-Pak and Guaifenesin cough syrup with Codeine for him.  Worsening anemia His hemoglobin is down to 9.1 and I suspect this is recurrent iron deficiency.  I will evaluate his iron studies today and arrange for IV iron if indicated.  Recent CVA September 2023 He has evidence of both an acute and old infarctions of the brain.  He is now on Plavix and aspirin 81 mg.  Osteoarthritis We already know he has severe changes of his knee and needs a knee replacement.  I think the problems with his right shoulder are also arthritic in nature.  Homeless His social situation is poor  and he has no place to live.  He does have some relatives that occasionally allow him to sleep on their couch but he often sleeps in the car.  He is aware of the [employer] and has been encouraged to place his application.  He tells me he has done so and is waiting.  Plan His feeding tube has now been removed and he is eating fairly well. He will come back in 3 months on 04/19/22 for a CT scan, CMP, and CBC labs and his next follow up appointment on 04/21/22. I will ask Dr. Coralie Keens to remove his port  He has requested assistance with the housing authority. The patient understands the plans discussed today and is in agreement with them.  He knows to contact our office if he develops concerns prior to his next appointment.     [name], MD  [facility] [address] [place] [address] Dept: [phone] Dept Fax: [phone]   No orders of the defined types were placed in this encounter.     CHIEF COMPLAINT:  CC: [name] with history of oropharyngeal cancer here for 4 week evaluation  Current Treatment:  Surveillance  INTERVAL HISTORY:  Bettie with a history of oropharyngeal cancer is here today for repeat clinical assessment. In September he was admitted for a stroke and was placed on Plavix, he received MRI from that admission reveals an acute/subacute nonhemorrhagic infarction of the left caudate and superior lentiform nucleus with a remote anterior right frontal lobe cortical infarct and moderate diffuse periventricular white matter disease consistent  with chronic microvascular ischemia. He had an echocardiogram which looks good with normal left ventricular function and an ejection fraction of 55 to 60%. He states that he is doing well but he has been coughing up yellowish/green phlegm for the past couple of months and it is worsening, especially when he eats. I will prescribe a Guaifenesin  cough syrup with Codeine and a antibiotic Z-Pak for treatment. His knee is still experiencing pain, and his skin a little pale patient may still be a little anemic. Patient feeding tube has been removed however, his port is still in. I will ask Dr. Coralie Keens to remove his port. He will come back in 3 months on 04/19/22 for a CT scan, CMP, and CBC labs and his next follow up appointment on 04/21/22. He has a concern of where he will ,live especially with weather getting colder. will also need a letter to housing authority as he is still waiting on housing for 1+yr now. He denies fevers or chills. He denies pain. His appetite is good. His weight has been stable since his last visit.    I have reviewed the past medical history, past surgical history, social history and family history with the patient and they are unchanged from previous note.  ALLERGIES:  has No Known Allergies.  MEDICATIONS:  Current Outpatient Medications  Medication Sig Dispense Refill   azithromycin (ZITHROMAX Z-PAK) 250 MG tablet 2 pills today, then 1 pill daily 6 each 0   ASPIRIN LOW DOSE 81 MG tablet Take 81 mg by mouth daily.     atorvastatin (LIPITOR) 40 MG tablet Take 1 tablet by mouth daily.     brimonidine-timolol (COMBIGAN) 0.2-0.5 % ophthalmic solution Place 1 drop into both eyes every 12 (twelve) hours. 15 mL 1   clopidogrel (PLAVIX) 75 MG tablet Take 75 mg by mouth daily.     cyanocobalamin 1000 MCG tablet Take by mouth.     ferrous sulfate 325 (65 FE) MG tablet Take 325 mg by mouth daily with breakfast. Informed patient to start taking iron supplement every day. 01/11/21     fluticasone (FLONASE) 50 MCG/ACT nasal spray Place into the nose.     glipiZIDE (GLUCOTROL XL) 5 MG 24 hr tablet Take 1 tablet by mouth daily.     glucose blood (ACCU-CHEK GUIDE) test strip Use as instructed twice per day E11.9 200 each 12   guaiFENesin-codeine 100-10 MG/5ML syrup Take 5 mLs by mouth every 6 (six) hours as needed for cough. 240  mL 0   HYDROcodone-acetaminophen (NORCO) 10-325 MG tablet Take 1 tablet by mouth 3 (three) times daily as needed.     ibuprofen (ADVIL) 800 MG tablet Take 1 tablet (800 mg total) by mouth every 8 (eight) hours as needed. 30 tablet 5   metFORMIN (GLUCOPHAGE) 1000 MG tablet Take 1 tablet (1,000 mg total) by mouth 2 (two) times daily with a meal. 180 tablet 3   montelukast (SINGULAIR) 10 MG tablet Take 1 tablet (10 mg total) by mouth at bedtime. 90 tablet 3   ofloxacin (OCUFLOX) 0.3 % ophthalmic solution      prednisoLONE acetate (PRED FORTE) 1 % ophthalmic suspension SMARTSIG:In Eye(s)     psyllium (METAMUCIL) 58.6 % packet Take 1 packet by mouth daily.     triamcinolone ointment (KENALOG) 0.5 % APPLY TO AFFECTED AREA(S) TWICE DAILY 30 g 2   No current facility-administered medications for this visit.    HISTORY OF PRESENT ILLNESS:   Oncology History Overview Note  Immunohistochemistry  for p16 is strongly positive.    Oropharyngeal cancer ([place])  09/10/2020 Initial Diagnosis   He presented with a palpable knot in his upper right neck to Dr. Redmond Baseman on 09/10/20. Patient denied any pain at that time, though did report some difficulty swallowing.     10/20/2020 Pathology Results   FINAL MICROSCOPIC DIAGNOSIS:   A. LYMPH NODE, RIGHT NECK, NEEDLE CORE BIOPSY:  - Metastatic squamous cell carcinoma associated with scanty lymphoid tissue.   10/20/2020 Procedure   Technically successful ultrasound guided biopsy of indeterminate right neck mass/nodal conglomeration.   10/22/2020 Procedure   The fiberoptic laryngoscope was then placed through the nasal passage to view the pharynx and larynx. After completion, the telescope was removed. Findings included normal nasal passages and no mass or abnormality in the nasopharynx. There is a granular mass involving the right lateral pharyngeal wall inferior to tonsillar area and extending to the midline of the vallecula. The larynx appears normal. Pyriform sinuses  are open. Secretions are minimal. Vocal folds are without mass, scarring, or ulceration. The vocal folds adduct and abduct symmetrically. There is good glottal closure. Muscle tension patterns are not present. Laryngeal edema is minimal.    11/11/2020 PET scan   1. Hypermetabolic mass in the RIGHT hypopharynx localizing to the RIGHT tonsillar pillar and vallecula. 2. Solitary hypermetabolic metastatic lymph node to the RIGHT level II nodal station. 3. No distant metastatic disease.   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[place])   11/16/2020 Cancer Staging   Staging form: Pharynx - HPV-Mediated Oropharynx, AJCC 8th Edition - Clinical stage from 11/16/2020: Stage I (cT2, cN1, cM0, p16+) - Signed by Eppie Gibson, MD on 11/16/2020 Stage prefix: Initial diagnosis   12/22/2020 - 02/01/2021 Chemotherapy   Patient is on Treatment Plan : HEAD/NECK Cisplatin q7d       Physical Exam Constitutional:      Appearance: Normal appearance.  HENT:     Nose: Nose normal.     Mouth/Throat:     Mouth: Mucous membranes are moist.     Pharynx: Oropharynx is clear.  Eyes:     General: No scleral icterus.    Extraocular Movements: Extraocular movements intact.     Conjunctiva/sclera: Conjunctivae normal.     Pupils: Pupils are equal, round, and reactive to light.  Cardiovascular:     Rate and Rhythm: Normal rate and regular rhythm.     Pulses: Normal pulses.     Heart sounds: Normal heart sounds. No murmur heard.    No gallop.  Pulmonary:     Effort: Pulmonary effort is normal. No respiratory distress.     Breath sounds: Normal breath sounds. No wheezing or rales.     Comments: Sounds like he may have bronchitis (has been previously diagnosed with it)  Musculoskeletal:        General: Normal range of motion.     Cervical back: Normal range of motion and neck supple.  Skin:    General: Skin is warm and dry.  Neurological:     Mental Status: He is alert and oriented to  person, place, and time.  Psychiatric:        Mood and Affect: Mood normal.        Behavior: Behavior normal.        Thought Content: Thought content normal.        Judgment: Judgment normal.      REVIEW OF SYSTEMS:   .Review of Systems  Constitutional: Negative.  Eyes: Negative.   Respiratory:  Positive for cough.   Cardiovascular: Negative.   Gastrointestinal: Negative.   Genitourinary: Negative.   Musculoskeletal:  Joint pain: Still experiencing knee pain.  Skin: Negative.   Neurological: Negative.   Endo/Heme/Allergies: Negative.   Psychiatric/Behavioral: Negative.      Constitutional: Denies fevers, chills or abnormal weight loss Eyes: Denies blurriness of vision Ears, nose, mouth, throat, and face: Denies mucositis or sore throat Respiratory: Denies cough, dyspnea or wheezes Cardiovascular: Denies palpitation, chest discomfort or lower extremity swelling Gastrointestinal:  Denies nausea, heartburn or change in bowel habits Skin: Denies abnormal skin rashes Lymphatics: Denies new lymphadenopathy or easy bruising Neurological:Denies numbness, tingling or new weaknesses Behavioral/Psych: Mood is stable, no new changes  All other systems were reviewed with the patient and are negative.   VITALS:  Blood pressure 134/78, pulse 79, temperature 97.7 F (36.5 C), temperature source Oral, resp. rate 19, height '5\' 8"'$  (1.727 m), weight 160 lb 14.4 oz (73 kg), SpO2 100 %.  Wt Readings from Last 3 Encounters:  01/18/22 160 lb 14.4 oz (73 kg)  11/17/21 161 lb 12 oz (73.4 kg)  11/10/21 160 lb 14.4 oz (73 kg)    Body mass index is 24.46 kg/m.  Performance status (ECOG): 1 - Symptomatic but completely ambulatory  PHYSICAL EXAM:   GENERAL:alert, no distress and comfortable SKIN: skin color, texture, turgor are normal, no rashes or significant lesions EYES: normal, Conjunctiva are pink and non-injected, sclera clear OROPHARYNX:no exudate, no erythema and lips, buccal  mucosa, and tongue normal  NECK: supple, thyroid normal size, non-tender, without nodularity LYMPH:  no palpable lymphadenopathy in the cervical, axillary or inguinal LUNGS: clear to auscultation and percussion with normal breathing effort HEART: regular rate & rhythm and no murmurs and no lower extremity edema ABDOMEN:abdomen soft, non-tender and normal bowel sounds Musculoskeletal:no cyanosis of digits and no clubbing  NEURO: alert & oriented x 3 with fluent speech, no focal motor/sensory deficits  LABORATORY DATA:  I have reviewed the data as listed    Component Value Date/Time   NA 140 01/18/2022 0858   NA 136 (A) 11/10/2021 0000   K 3.5 01/18/2022 0858   CL 103 01/18/2022 0858   CO2 27 01/18/2022 0858   GLUCOSE 92 01/18/2022 0858   BUN 13 01/18/2022 0858   BUN 16 11/10/2021 0000   CREATININE 0.68 01/18/2022 0858   CALCIUM 9.6 01/18/2022 0858   PROT 8.1 01/18/2022 0858   ALBUMIN 4.7 01/18/2022 0858   AST 16 01/18/2022 0858   ALT 14 01/18/2022 0858   ALKPHOS 56 01/18/2022 0858   BILITOT 0.5 01/18/2022 0858   GFRNONAA >60 01/18/2022 0858   GFRAA >60 05/17/2016 2106    No results found for: "SPEP", "UPEP"  Lab Results  Component Value Date   WBC 6.8 01/18/2022   NEUTROABS 5.3 01/18/2022   HGB 12.1 (L) 01/18/2022   HCT 39.0 01/18/2022   MCV 90.9 01/18/2022   PLT 239 01/18/2022      Chemistry      Component Value Date/Time   NA 140 01/18/2022 0858   NA 136 (A) 11/10/2021 0000   K 3.5 01/18/2022 0858   CL 103 01/18/2022 0858   CO2 27 01/18/2022 0858   BUN 13 01/18/2022 0858   BUN 16 11/10/2021 0000   CREATININE 0.68 01/18/2022 0858   GLU 160 11/10/2021 0000      Component Value Date/Time   CALCIUM 9.6 01/18/2022 0858   ALKPHOS 56 01/18/2022 0858  AST 16 01/18/2022 0858   ALT 14 01/18/2022 0858   BILITOT 0.5 01/18/2022 0858       RADIOGRAPHIC STUDIES: No results found. EXAM; Oct 07, 2021 CT NECK WITH CONTRAST IMPRESSIONS Postoperative changes without  nodular or mass like enhancement to suggest residual/recurrent malignancy. No adenopathy. Emphysema  I,Gabriella Ballesteros,acting as a scribe for [name], MD.,have documented all relevant documentation on the behalf of [name], MD,as directed by  [name], MD while in the presence of [name], MD.

## 2022-01-16 NOTE — Progress Notes (Signed)
ANNUAL DIABETIC FOOT EXAM  Subjective: David Kelly presents today for annual diabetic foot examination.  Chief Complaint  Patient presents with   Nail Problem    Diabetic foot care BS- Did not check today A1C- Do not remember PCP-Can not remember  PCP name PCP VST-11/2021   Patient confirms h/o diabetes.  Patient relates 8 year h/o diabetes.  Patient denies any h/o foot wounds.  Patient denies any numbness, tingling, burning, or pins/needle sensation in feet.  Risk factors: diabetes, h/o MI, hyperlipidemia, h/o tobacco use in remission.  Biagio Borg, MD is patient's PCP.  Past Medical History:  Diagnosis Date   Allergy    Arthritis    Blood transfusion without reported diagnosis    with bleeding colon per pt in the past    BPH (benign prostatic hypertrophy)    Cancer ([facility])    head/neck   Diabetes mellitus    DX  5 YR AGO  Type II   Glaucoma    H/O: GI bleed    History of kidney stones    HLD (hyperlipidemia) 09/28/2017   Myocardial infarction ([place])    in the 1990's   Patient Active Problem List   Diagnosis Date Noted   History of radiation to head and neck region 04/02/2021   Loss of weight 04/02/2021   Xerostomia due to radiotherapy 04/02/2021   Dysgeusia 04/02/2021   Dysphagia 04/02/2021   Odynophagia 04/02/2021   Accretions on teeth 04/02/2021   Dehydration 02/10/2021   Weight loss 02/10/2021   Postoperative examination 12/09/2020   Deficiency anemia 12/03/2020    Class: Chronic   Loss of teeth due to extraction 12/01/2020   Retained tooth root 12/01/2020   Preventive measure    Oropharyngeal cancer ([place]) 10/22/2020   Metastasis to head and neck lymph node ([place]) 10/22/2020   Neck mass 09/10/2020   Vitamin D deficiency 04/30/2020   B12 deficiency 04/29/2020   Abscess of finger 09/06/2018   MI (myocardial infarction) ([place]) 12/11/2017   History of left knee replacement 12/11/2017   HLD (hyperlipidemia) 09/28/2017   Chronic cough 03/31/2017    Primary osteoarthritis of right knee 03/15/2017   Unspecified injury of lower back, sequela 03/15/2017   Bilateral rotator cuff syndrome 03/15/2017   Recurrent falls while walking 12/13/2016   Chronic pain 12/13/2016   Kidney stones    Glaucoma    Gastrointestinal bleed 06/28/2016   Dysuria 10/28/2015   Diabetes mellitus ([place]) 07/15/2015   Osteoarthritis 07/15/2015   Past Surgical History:  Procedure Laterality Date   COLONOSCOPY     CYSTOSCOPY WITH RETROGRADE PYELOGRAM, URETEROSCOPY AND STENT PLACEMENT Right 05/20/2016   Procedure: CYSTOSCOPY, URETEROSCOPY, TRANSURETHRAL INCISION OF URETHROCELE, BASKET STONE REMOVAL;  Surgeon: Irine Seal, MD;  Location: [facility];  Service: Urology;  Laterality: Right;   Gall Bladder STones removed     left total knee replacement     MULTIPLE EXTRACTIONS WITH ALVEOLOPLASTY N/A 11/20/2020   Procedure: MULTIPLE EXTRACTION WITH ALVEOLOPLASTY;  Surgeon: [name], DMD;  Location: [facility];  Service: Dentistry;  Laterality: N/A;   TOTAL KNEE ARTHROPLASTY  04/04/2011   Procedure: TOTAL KNEE ARTHROPLASTY;  Surgeon: Rudean Haskell, MD;  Location: [facility];  Service: Orthopedics;  Laterality: Left;   Current Outpatient Medications on File Prior to Visit  Medication Sig Dispense Refill   ASPIRIN LOW DOSE 81 MG tablet Take 81 mg by mouth daily.     atorvastatin (LIPITOR) 40 MG tablet Take 1 tablet by mouth daily.  azithromycin (ZITHROMAX Z-PAK) 250 MG tablet 2 pills today, then 1 pill daily 6 each 0   brimonidine-timolol (COMBIGAN) 0.2-0.5 % ophthalmic solution Place 1 drop into both eyes every 12 (twelve) hours. 15 mL 1   clopidogrel (PLAVIX) 75 MG tablet Take 75 mg by mouth daily.     cyanocobalamin 1000 MCG tablet Take by mouth.     ferrous sulfate 325 (65 FE) MG tablet Take 325 mg by mouth daily with breakfast. Informed patient to start taking iron supplement every day. 01/11/21     fluticasone (FLONASE) 50 MCG/ACT nasal spray Place into the nose.      glipiZIDE (GLUCOTROL XL) 5 MG 24 hr tablet Take 1 tablet by mouth daily.     glucose blood (ACCU-CHEK GUIDE) test strip Use as instructed twice per day E11.9 200 each 12   guaiFENesin-codeine 100-10 MG/5ML syrup Take 5 mLs by mouth every 6 (six) hours as needed for cough. 240 mL 0   HYDROcodone-acetaminophen (NORCO) 10-325 MG tablet Take 1 tablet by mouth 3 (three) times daily as needed.     ibuprofen (ADVIL) 800 MG tablet Take 1 tablet (800 mg total) by mouth every 8 (eight) hours as needed. 30 tablet 5   metFORMIN (GLUCOPHAGE) 1000 MG tablet Take 1 tablet (1,000 mg total) by mouth 2 (two) times daily with a meal. 180 tablet 3   montelukast (SINGULAIR) 10 MG tablet Take 1 tablet (10 mg total) by mouth at bedtime. 90 tablet 3   ofloxacin (OCUFLOX) 0.3 % ophthalmic solution      prednisoLONE acetate (PRED FORTE) 1 % ophthalmic suspension SMARTSIG:In Eye(s)     psyllium (METAMUCIL) 58.6 % packet Take 1 packet by mouth daily.     triamcinolone ointment (KENALOG) 0.5 % APPLY TO AFFECTED AREA(S) TWICE DAILY 30 g 2   No current facility-administered medications on file prior to visit.    No Known Allergies Social History   Occupational History   Occupation: retired  Tobacco Use   Smoking status: Former    Packs/day: 1.50    Years: 45.00    Total pack years: 67.50    Types: Cigarettes    Quit date: 03/21/2006    Years since quitting: 15.8   Smokeless tobacco: Former    Types: Chew    Quit date: 09/14/2018  Vaping Use   Vaping Use: Never used  Substance and Sexual Activity   Alcohol use: No   Drug use: No   Sexual activity: Not Currently   Family History  Problem Relation Age of Onset   Arthritis Mother    Asthma Paternal Grandfather    Diabetes Sister    Colon polyps Brother    Colon cancer Neg Hx    Esophageal cancer Neg Hx    Rectal cancer Neg Hx    Stomach cancer Neg Hx    Immunization History  Administered Date(s) Administered   Fluad Quad(high Dose 65+) 11/17/2020    Influenza, High Dose Seasonal PF 10/28/2015, 11/17/2017   Influenza,inj,Quad PF,6+ Mos 11/17/2021   Moderna Sars-Covid-2 Vaccination 04/15/2019, 05/20/2019, 11/18/2019     Review of Systems: Negative except as noted in the HPI.   Objective: There were no vitals filed for this visit.  RYDGE TEXIDOR is a pleasant 76 y.o. male in NAD. AAO X 3.  Vascular Examination: CFT <3 seconds b/l LE. Palpable DP pulse(s) b/l LE. Faintly palpable PT pulse(s) b/l LE. Pedal hair sparse. No pain with calf compression b/l. Lower extremity skin temperature gradient within normal  limits. No edema noted b/l LE. No cyanosis or clubbing noted b/l LE.  Dermatological Examination: Pedal integument with normal turgor, texture and tone BLE. No open wounds b/l LE. No interdigital macerations noted b/l LE. Toenails 1-5 b/l elongated, discolored, dystrophic, thickened, crumbly with subungual debris and tenderness to dorsal palpation. No hyperkeratotic nor porokeratotic lesions present on today's visit.  Neurological Examination: Protective sensation intact 5/5 intact bilaterally with 10g monofilament b/l. Vibratory sensation intact b/l.  Musculoskeletal Examination: Normal muscle strength 5/5 to all lower extremity muscle groups bilaterally. HAV with bunion bilaterally and hammertoes 2-5 b/l.Marland Kitchen No pain, crepitus or joint limitation noted with ROM b/l LE.  Patient ambulates independently without assistive aids.  Footwear Assessment: Does the patient wear appropriate shoes? Yes. Does the patient need inserts/orthotics? No.  ADA Risk Categorization: Low Risk :  Patient has all of the following: Intact protective sensation No prior foot ulcer  No severe deformity Pedal pulses present  Assessment: 1. Pain due to onychomycosis of toenail   2. Hallux valgus, acquired, bilateral   3. Acquired hammertoes of both feet   4. Diabetes mellitus without complication ([place])   5. Encounter for diabetic foot exam ([place])      Plan: No orders of the defined types were placed in this encounter.  -Consent given for treatment as described below: -Examined patient. -Diabetic foot examination performed today. -Continue diabetic foot care principles: inspect feet daily, monitor glucose as recommended by PCP and/or Endocrinologist, and follow prescribed diet per PCP, Endocrinologist and/or dietician. -Continue supportive shoe gear daily. -Toenails 1-5 b/l were debrided in length and girth with sterile nail nippers and dremel without iatrogenic bleeding.  -Patient/POA to call should there be question/concern in the interim.  Return in about 3 months (around 04/14/2022).  Marzetta Board, DPM

## 2022-01-18 ENCOUNTER — Inpatient Hospital Stay: Payer: Medicare Other

## 2022-01-18 ENCOUNTER — Encounter: Payer: Self-pay | Admitting: Oncology

## 2022-01-18 ENCOUNTER — Other Ambulatory Visit: Payer: Self-pay | Admitting: Oncology

## 2022-01-18 ENCOUNTER — Inpatient Hospital Stay: Payer: Medicare Other | Attending: Hematology and Oncology | Admitting: Oncology

## 2022-01-18 VITALS — BP 134/78 | HR 79 | Temp 97.7°F | Resp 19 | Ht 68.0 in | Wt 160.9 lb

## 2022-01-18 DIAGNOSIS — Z8673 Personal history of transient ischemic attack (TIA), and cerebral infarction without residual deficits: Secondary | ICD-10-CM | POA: Diagnosis not present

## 2022-01-18 DIAGNOSIS — Z7982 Long term (current) use of aspirin: Secondary | ICD-10-CM | POA: Diagnosis not present

## 2022-01-18 DIAGNOSIS — C109 Malignant neoplasm of oropharynx, unspecified: Secondary | ICD-10-CM

## 2022-01-18 DIAGNOSIS — Z9221 Personal history of antineoplastic chemotherapy: Secondary | ICD-10-CM | POA: Diagnosis not present

## 2022-01-18 DIAGNOSIS — Z7984 Long term (current) use of oral hypoglycemic drugs: Secondary | ICD-10-CM | POA: Diagnosis not present

## 2022-01-18 DIAGNOSIS — Z79899 Other long term (current) drug therapy: Secondary | ICD-10-CM | POA: Insufficient documentation

## 2022-01-18 DIAGNOSIS — Z96652 Presence of left artificial knee joint: Secondary | ICD-10-CM

## 2022-01-18 DIAGNOSIS — Z923 Personal history of irradiation: Secondary | ICD-10-CM | POA: Insufficient documentation

## 2022-01-18 DIAGNOSIS — R053 Chronic cough: Secondary | ICD-10-CM

## 2022-01-18 DIAGNOSIS — Z85818 Personal history of malignant neoplasm of other sites of lip, oral cavity, and pharynx: Secondary | ICD-10-CM | POA: Insufficient documentation

## 2022-01-18 DIAGNOSIS — C770 Secondary and unspecified malignant neoplasm of lymph nodes of head, face and neck: Secondary | ICD-10-CM

## 2022-01-18 DIAGNOSIS — J209 Acute bronchitis, unspecified: Secondary | ICD-10-CM

## 2022-01-18 DIAGNOSIS — Z7902 Long term (current) use of antithrombotics/antiplatelets: Secondary | ICD-10-CM | POA: Insufficient documentation

## 2022-01-18 LAB — CBC WITH DIFFERENTIAL (CANCER CENTER ONLY)
Abs Immature Granulocytes: 0.02 10*3/uL (ref 0.00–0.07)
Basophils Absolute: 0 10*3/uL (ref 0.0–0.1)
Basophils Relative: 0 %
Eosinophils Absolute: 0.1 10*3/uL (ref 0.0–0.5)
Eosinophils Relative: 2 %
HCT: 39 % (ref 39.0–52.0)
Hemoglobin: 12.1 g/dL — ABNORMAL LOW (ref 13.0–17.0)
Immature Granulocytes: 0 %
Lymphocytes Relative: 12 %
Lymphs Abs: 0.8 10*3/uL (ref 0.7–4.0)
MCH: 28.2 pg (ref 26.0–34.0)
MCHC: 31 g/dL (ref 30.0–36.0)
MCV: 90.9 fL (ref 80.0–100.0)
Monocytes Absolute: 0.5 10*3/uL (ref 0.1–1.0)
Monocytes Relative: 7 %
Neutro Abs: 5.3 10*3/uL (ref 1.7–7.7)
Neutrophils Relative %: 79 %
Platelet Count: 239 10*3/uL (ref 150–400)
RBC: 4.29 MIL/uL (ref 4.22–5.81)
RDW: 14.4 % (ref 11.5–15.5)
WBC Count: 6.8 10*3/uL (ref 4.0–10.5)
nRBC: 0 % (ref 0.0–0.2)

## 2022-01-18 LAB — CMP (CANCER CENTER ONLY)
ALT: 14 U/L (ref 0–44)
AST: 16 U/L (ref 15–41)
Albumin: 4.7 g/dL (ref 3.5–5.0)
Alkaline Phosphatase: 56 U/L (ref 38–126)
Anion gap: 10 (ref 5–15)
BUN: 13 mg/dL (ref 8–23)
CO2: 27 mmol/L (ref 22–32)
Calcium: 9.6 mg/dL (ref 8.9–10.3)
Chloride: 103 mmol/L (ref 98–111)
Creatinine: 0.68 mg/dL (ref 0.61–1.24)
GFR, Estimated: >60 mL/min (ref >60)
Glucose, Bld: 92 mg/dL (ref 70–99)
Potassium: 3.5 mmol/L (ref 3.5–5.1)
Sodium: 140 mmol/L (ref 135–145)
Total Bilirubin: 0.5 mg/dL (ref 0.3–1.2)
Total Protein: 8.1 g/dL (ref 6.5–8.1)

## 2022-01-18 MED ORDER — AZITHROMYCIN 250 MG PO TABS: ORAL_TABLET | ORAL | 0 refills | Status: DC

## 2022-01-18 MED ORDER — GUAIFENESIN-CODEINE 100-10 MG/5ML PO SOLN: 5.0000 mL | Freq: Four times a day (QID) | ORAL | 0 refills | Status: DC | PRN

## 2022-01-20 ENCOUNTER — Encounter: Payer: Self-pay | Admitting: Oncology

## 2022-01-24 ENCOUNTER — Other Ambulatory Visit: Payer: Self-pay

## 2022-01-24 DIAGNOSIS — C770 Secondary and unspecified malignant neoplasm of lymph nodes of head, face and neck: Secondary | ICD-10-CM

## 2022-01-26 ENCOUNTER — Ambulatory Visit: Payer: Medicare Other | Admitting: Dietician

## 2022-01-26 NOTE — Progress Notes (Signed)
Nutrition Follow-up:   Called patient on mobile number. Patient reports only nutrition concern now involves his homelessness and inability to cook meals.  He also expressed concern with having port removed and when that will be scheduled.  Little change in PO now out of Ensure but still continues eating canned fruits and vegetables. He reports no trouble swallowing or pain with eating. Bowels are regular now.    Labs: 01/18/22 Hgb 12.1   Anthropometrics: Weight stable 01/18/22  160.8# 11/10/21  160.9# 09/09/21  158.7# 08/09/21   159.7# 07/01/21  159.6# BMI: 24.46   Estimated Energy Needs     Kcals: 1800-2200 Protein: 73-88 grams Fluid: >/= 2200   NUTRITION DIAGNOSIS: Inadequate enteral intake overall related to dysphagia AEB continued weight loss and usual recall of enteral feeds. Resolved taking PO feeds with protein needs being met with ONS   INTERVENTION:  Relayed resource of [employer] for hot meal. Encouraged continued use of shelf stable soft protein sources like beans, canned chicken, canned tuna.  Unfortunately, limited supplies of donated ONS to provide to patient at this time.  Will ask LSW to put together a bag from our food pantry to aid patient as well as asking what resources or aid she could provide with shelter.  Reviewed shelf stable foods and liquids to maintain in diet.      MONITORING, EVALUATION, GOAL: PO intake, weight, labs     Next Visit: PRN at patient or provider request    April Manson, RDN, LDN Registered Dietitian, [facility] Part Time Remote (Usual office hours: Tuesday-Thursday) Cell: [phone]

## 2022-02-09 DIAGNOSIS — G8929 Other chronic pain: Secondary | ICD-10-CM | POA: Diagnosis not present

## 2022-02-09 DIAGNOSIS — Z6824 Body mass index (BMI) 24.0-24.9, adult: Secondary | ICD-10-CM | POA: Diagnosis not present

## 2022-02-09 DIAGNOSIS — M545 Low back pain, unspecified: Secondary | ICD-10-CM | POA: Diagnosis not present

## 2022-02-09 DIAGNOSIS — M25561 Pain in right knee: Secondary | ICD-10-CM | POA: Diagnosis not present

## 2022-02-09 DIAGNOSIS — Z79899 Other long term (current) drug therapy: Secondary | ICD-10-CM | POA: Diagnosis not present

## 2022-02-09 DIAGNOSIS — Z79891 Long term (current) use of opiate analgesic: Secondary | ICD-10-CM | POA: Diagnosis not present

## 2022-02-23 ENCOUNTER — Encounter: Payer: Self-pay | Admitting: Oncology

## 2022-03-04 DIAGNOSIS — Z85819 Personal history of malignant neoplasm of unspecified site of lip, oral cavity, and pharynx: Secondary | ICD-10-CM | POA: Diagnosis not present

## 2022-03-04 DIAGNOSIS — E1149 Type 2 diabetes mellitus with other diabetic neurological complication: Secondary | ICD-10-CM | POA: Diagnosis not present

## 2022-03-04 DIAGNOSIS — M159 Polyosteoarthritis, unspecified: Secondary | ICD-10-CM | POA: Diagnosis not present

## 2022-03-04 DIAGNOSIS — Z8673 Personal history of transient ischemic attack (TIA), and cerebral infarction without residual deficits: Secondary | ICD-10-CM | POA: Diagnosis not present

## 2022-03-04 DIAGNOSIS — Z6823 Body mass index (BMI) 23.0-23.9, adult: Secondary | ICD-10-CM | POA: Diagnosis not present

## 2022-03-10 DIAGNOSIS — E1149 Type 2 diabetes mellitus with other diabetic neurological complication: Secondary | ICD-10-CM | POA: Diagnosis not present

## 2022-03-10 DIAGNOSIS — E785 Hyperlipidemia, unspecified: Secondary | ICD-10-CM | POA: Diagnosis not present

## 2022-03-11 DIAGNOSIS — M25561 Pain in right knee: Secondary | ICD-10-CM | POA: Diagnosis not present

## 2022-03-11 DIAGNOSIS — Z79891 Long term (current) use of opiate analgesic: Secondary | ICD-10-CM | POA: Diagnosis not present

## 2022-03-11 DIAGNOSIS — M545 Low back pain, unspecified: Secondary | ICD-10-CM | POA: Diagnosis not present

## 2022-03-11 DIAGNOSIS — G8929 Other chronic pain: Secondary | ICD-10-CM | POA: Diagnosis not present

## 2022-03-11 DIAGNOSIS — Z79899 Other long term (current) drug therapy: Secondary | ICD-10-CM | POA: Diagnosis not present

## 2022-03-11 DIAGNOSIS — Z760 Encounter for issue of repeat prescription: Secondary | ICD-10-CM | POA: Diagnosis not present

## 2022-03-16 DIAGNOSIS — Z1389 Encounter for screening for other disorder: Secondary | ICD-10-CM | POA: Diagnosis not present

## 2022-03-16 DIAGNOSIS — Z0000 Encounter for general adult medical examination without abnormal findings: Secondary | ICD-10-CM | POA: Diagnosis not present

## 2022-03-16 DIAGNOSIS — E1149 Type 2 diabetes mellitus with other diabetic neurological complication: Secondary | ICD-10-CM | POA: Diagnosis not present

## 2022-03-16 DIAGNOSIS — Z452 Encounter for adjustment and management of vascular access device: Secondary | ICD-10-CM | POA: Diagnosis not present

## 2022-03-16 DIAGNOSIS — Z139 Encounter for screening, unspecified: Secondary | ICD-10-CM | POA: Diagnosis not present

## 2022-03-16 DIAGNOSIS — Z6824 Body mass index (BMI) 24.0-24.9, adult: Secondary | ICD-10-CM | POA: Diagnosis not present

## 2022-03-16 DIAGNOSIS — E785 Hyperlipidemia, unspecified: Secondary | ICD-10-CM | POA: Diagnosis not present

## 2022-03-16 DIAGNOSIS — Z136 Encounter for screening for cardiovascular disorders: Secondary | ICD-10-CM | POA: Diagnosis not present

## 2022-03-28 DIAGNOSIS — E785 Hyperlipidemia, unspecified: Secondary | ICD-10-CM | POA: Diagnosis not present

## 2022-03-28 DIAGNOSIS — Z7984 Long term (current) use of oral hypoglycemic drugs: Secondary | ICD-10-CM | POA: Diagnosis not present

## 2022-03-28 DIAGNOSIS — Z7982 Long term (current) use of aspirin: Secondary | ICD-10-CM | POA: Diagnosis not present

## 2022-03-28 DIAGNOSIS — Z7902 Long term (current) use of antithrombotics/antiplatelets: Secondary | ICD-10-CM | POA: Diagnosis not present

## 2022-03-28 DIAGNOSIS — Z85819 Personal history of malignant neoplasm of unspecified site of lip, oral cavity, and pharynx: Secondary | ICD-10-CM | POA: Diagnosis not present

## 2022-03-28 DIAGNOSIS — I252 Old myocardial infarction: Secondary | ICD-10-CM | POA: Diagnosis not present

## 2022-03-28 DIAGNOSIS — Z79899 Other long term (current) drug therapy: Secondary | ICD-10-CM | POA: Diagnosis not present

## 2022-03-28 DIAGNOSIS — Z452 Encounter for adjustment and management of vascular access device: Secondary | ICD-10-CM | POA: Diagnosis not present

## 2022-03-28 DIAGNOSIS — C109 Malignant neoplasm of oropharynx, unspecified: Secondary | ICD-10-CM | POA: Diagnosis not present

## 2022-03-28 DIAGNOSIS — E119 Type 2 diabetes mellitus without complications: Secondary | ICD-10-CM | POA: Diagnosis not present

## 2022-04-05 ENCOUNTER — Telehealth: Payer: Self-pay | Admitting: Oncology

## 2022-04-05 DIAGNOSIS — J020 Streptococcal pharyngitis: Secondary | ICD-10-CM | POA: Diagnosis not present

## 2022-04-05 DIAGNOSIS — Z20822 Contact with and (suspected) exposure to covid-19: Secondary | ICD-10-CM | POA: Diagnosis not present

## 2022-04-05 DIAGNOSIS — Z6824 Body mass index (BMI) 24.0-24.9, adult: Secondary | ICD-10-CM | POA: Diagnosis not present

## 2022-04-05 NOTE — Telephone Encounter (Signed)
CT Neck has been scheduled for 04/19/22 @ 11:30 ; Checking in @ 10:30   Notified pt of date,time and instructions.

## 2022-04-11 DIAGNOSIS — Z79899 Other long term (current) drug therapy: Secondary | ICD-10-CM | POA: Diagnosis not present

## 2022-04-11 DIAGNOSIS — M545 Low back pain, unspecified: Secondary | ICD-10-CM | POA: Diagnosis not present

## 2022-04-11 DIAGNOSIS — G8929 Other chronic pain: Secondary | ICD-10-CM | POA: Diagnosis not present

## 2022-04-11 DIAGNOSIS — M25561 Pain in right knee: Secondary | ICD-10-CM | POA: Diagnosis not present

## 2022-04-19 DIAGNOSIS — C109 Malignant neoplasm of oropharynx, unspecified: Secondary | ICD-10-CM | POA: Diagnosis not present

## 2022-04-19 DIAGNOSIS — J439 Emphysema, unspecified: Secondary | ICD-10-CM | POA: Diagnosis not present

## 2022-04-19 LAB — COMPREHENSIVE METABOLIC PANEL
Albumin: 4.3 (ref 3.5–5.0)
Calcium: 9.2 (ref 8.7–10.7)

## 2022-04-19 LAB — BASIC METABOLIC PANEL
BUN: 23 — AB (ref 4–21)
CO2: 25 — AB (ref 13–22)
Chloride: 107 (ref 99–108)
Creatinine: 0.8 (ref 0.6–1.3)
Glucose: 115
Potassium: 4 mEq/L (ref 3.5–5.1)
Sodium: 142 (ref 137–147)

## 2022-04-19 LAB — CBC AND DIFFERENTIAL
HCT: 33 — AB (ref 41–53)
Hemoglobin: 10.6 — AB (ref 13.5–17.5)
Neutrophils Absolute: 5.06
Platelets: 239 10*3/uL (ref 150–400)
WBC: 6.4

## 2022-04-19 LAB — HEPATIC FUNCTION PANEL
ALT: 16 U/L (ref 10–40)
AST: 23 (ref 14–40)
Alkaline Phosphatase: 48 (ref 25–125)
Bilirubin, Total: 0.2

## 2022-04-19 LAB — CBC: RBC: 3.78 — AB (ref 3.87–5.11)

## 2022-04-21 ENCOUNTER — Other Ambulatory Visit: Payer: Self-pay | Admitting: Oncology

## 2022-04-21 ENCOUNTER — Encounter: Payer: Self-pay | Admitting: Oncology

## 2022-04-21 ENCOUNTER — Inpatient Hospital Stay: Payer: 59 | Attending: Hematology and Oncology | Admitting: Oncology

## 2022-04-21 VITALS — BP 124/78 | HR 85 | Temp 98.0°F | Resp 17 | Ht 68.0 in | Wt 161.4 lb

## 2022-04-21 DIAGNOSIS — C109 Malignant neoplasm of oropharynx, unspecified: Secondary | ICD-10-CM | POA: Diagnosis not present

## 2022-04-21 DIAGNOSIS — D539 Nutritional anemia, unspecified: Secondary | ICD-10-CM | POA: Diagnosis not present

## 2022-04-21 DIAGNOSIS — R053 Chronic cough: Secondary | ICD-10-CM

## 2022-04-21 DIAGNOSIS — C770 Secondary and unspecified malignant neoplasm of lymph nodes of head, face and neck: Secondary | ICD-10-CM

## 2022-04-21 MED ORDER — GUAIFENESIN-CODEINE 100-10 MG/5ML PO SOLN: 5.0000 mL | Freq: Four times a day (QID) | ORAL | 0 refills | Status: DC | PRN

## 2022-04-21 NOTE — Progress Notes (Signed)
Patient Care Team: Biagio Borg, MD as PCP - General (Internal Medicine) Landis Martins, DPM as Consulting Physician (Podiatry) Irine Seal, MD as Attending Physician (Urology) Malmfelt, Stephani Police, RN as Oncology Nurse Navigator Melida Quitter, MD as Consulting Physician (Otolaryngology) Eppie Gibson, MD as Consulting Physician (Radiation Oncology) [name], MD as Consulting Physician (Oncology)  Clinic Day:  04/21/22   Referring physician: Biagio Borg, MD  ASSESSMENT & PLAN:   Assessment & Plan: Oropharyngeal cancer (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  He has a mass involving the right lateral pharyngeal wall inferior to the tonsillar region and extending to the midline of the vallecular. He completed concurrent chemoradiation with weekly cisplatin in December. He tolerated treatment fairly well, but he has issues with malnutrition and weight loss.  CT scan from February 2023 showed improvement but still accentuated enhancement in the right vallecula and he did follow-up with Dr. Redmond Baseman with ENT with naso-pharyngoscopy, which was negative. Repeat scan in August of 2023 was clear and now a repeat at 6 months, on April 19, 2022 shows no evidence of disease with just resolving posttreatment changes..   Anemia His hemoglobin has improved from 9.1 to 10.6 with an MCV of 88.  We did find recurrent iron deficiency and he was treated with IV iron in October 2023.  I will recheck anemia studies when he returns.  Recent CVA September 2023 He has evidence of both an acute and old infarctions of the brain.  He is now on Plavix and aspirin 81 mg.  Osteoarthritis We already know he has severe changes of his knee and needs a knee replacement.  I think the problems with his right shoulder are also arthritic in nature.  Homeless His social situation is poor and he has no place to live.  He does have some  relatives that occasionally allow him to sleep on their couch and he bathes at his brother's house, but he often sleeps in the car.  He is aware of the [employer] and has been encouraged to place his application.  He tells me he has done so and is waiting.   Plan His feeding tube has now been removed but he is not eating well.  His appetite is poor and he has trouble chewing.  We did give him more samples and coupons for Ensure.  I reviewed his test results and they look good with no evidence of disease.  He will come back in 3 months with CMP, CBC, B12, folate, iron, TIBC and ferritin level, and we will plan to repeat a scan in 6 months. I have asked Dr. Coralie Keens to remove his port  He has requested assistance with the housing authority and remains homeless. The patient understands the plans discussed today and is in agreement with them.  He knows to contact our office if he develops concerns prior to his next appointment.     [name], MD  [facility] [address] [place] [address] Dept: [phone] Dept Fax: [phone]   No orders of the defined types were placed in this encounter.     CHIEF COMPLAINT:  CC: A 77 year old male with history of oropharyngeal cancer  Current Treatment:  Surveillance  INTERVAL HISTORY:  David Kelly with a history of oropharyngeal cancer is here today for repeat clinical assessment.  He states his appetite is poor  and he has trouble chewing his food.  He is still homeless and lives in his car but bathes at his brother's house.  We did give him some Ensure supplements.  He does complain of constant pain in the right arm at a 9 out of 10 and is seeing a pain specialist.  The patient's feeding tube has been removed, however, his port was still in, and I asked Dr. Coralie Keens to remove his port.  He denies fevers or chills. He denies pain. His appetite is good. His weight has been  stable since his last visit.  He did have IV iron in October 2023 after he was found to be iron deficient.  His hemoglobin has come up from 9.1-10.6 with an MCV of 88 and normal white count, normal platelet count.  CMP is unremarkable other than a BUN of 23.  His nonfasting blood sugar is 115.  He did have CT scans of the neck and this shows resolving posttreatment changes with no evidence of local recurrence or metastatic disease in the neck.  He does have emphysema.   I have reviewed the past medical history, past surgical history, social history and family history with the patient and they are unchanged from previous note.  ALLERGIES:  has No Known Allergies.  MEDICATIONS:  Current Outpatient Medications  Medication Sig Dispense Refill   atorvastatin (LIPITOR) 80 MG tablet Take by mouth.     esomeprazole (NEXIUM) 40 MG capsule Take 40 mg by mouth daily at 12 noon.     ferrous sulfate 325 (65 FE) MG tablet Take 325 mg by mouth daily with breakfast. Informed patient to start taking iron supplement every day. 01/11/21     metFORMIN (GLUCOPHAGE) 1000 MG tablet Take 1 tablet (1,000 mg total) by mouth 2 (two) times daily with a meal. 180 tablet 3   guaiFENesin-codeine 100-10 MG/5ML syrup Take 5 mLs by mouth every 6 (six) hours as needed for cough. 240 mL 0   No current facility-administered medications for this visit.    HISTORY OF PRESENT ILLNESS:   Oncology History Overview Note  Immunohistochemistry for p16 is strongly positive.    Oropharyngeal cancer  09/10/2020 Initial Diagnosis   He presented with a palpable knot in his upper right neck to Dr. Redmond Baseman on 09/10/20. Patient denied any pain at that time, though did report some difficulty swallowing.     10/20/2020 Pathology Results   FINAL MICROSCOPIC DIAGNOSIS:   A. LYMPH NODE, RIGHT NECK, NEEDLE CORE BIOPSY:  - Metastatic squamous cell carcinoma associated with scanty lymphoid tissue.   10/20/2020 Procedure   Technically successful  ultrasound guided biopsy of indeterminate right neck mass/nodal conglomeration.   10/22/2020 Procedure   The fiberoptic laryngoscope was then placed through the nasal passage to view the pharynx and larynx. After completion, the telescope was removed. Findings included normal nasal passages and no mass or abnormality in the nasopharynx. There is a granular mass involving the right lateral pharyngeal wall inferior to tonsillar area and extending to the midline of the vallecula. The larynx appears normal. Pyriform sinuses are open. Secretions are minimal. Vocal folds are without mass, scarring, or ulceration. The vocal folds adduct and abduct symmetrically. There is good glottal closure. Muscle tension patterns are not present. Laryngeal edema is minimal.    11/11/2020 PET scan   1. Hypermetabolic mass in the RIGHT hypopharynx localizing to the RIGHT tonsillar pillar and vallecula. 2. Solitary hypermetabolic metastatic lymph node to the RIGHT level II nodal station. 3. No  distant metastatic disease.   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[place])   11/16/2020 Cancer Staging   Staging form: Pharynx - HPV-Mediated Oropharynx, AJCC 8th Edition - Clinical stage from 11/16/2020: Stage I (cT2, cN1, cM0, p16+) - Signed by Eppie Gibson, MD on 11/16/2020 Stage prefix: Initial diagnosis   12/22/2020 - 02/01/2021 Chemotherapy   Patient is on Treatment Plan : HEAD/NECK Cisplatin q7d          REVIEW OF SYSTEMS:   .Review of Systems  Constitutional: Negative.   Eyes: Negative.   Respiratory:  Positive for cough.   Cardiovascular: Negative.   Gastrointestinal: Negative.   Genitourinary: Negative.   Musculoskeletal:  Joint pain: Still experiencing knee pain.  Skin: Negative.   Neurological: Negative.   Endo/Heme/Allergies: Negative.   Psychiatric/Behavioral: Negative.     VITALS:  Blood pressure 124/78, pulse 85, temperature 98 F (36.7 C), resp. rate 17, height 5\' 8"   (1.727 m), weight 161 lb 6.4 oz (73.2 kg), SpO2 99 %.  Wt Readings from Last 3 Encounters:  04/21/22 161 lb 6.4 oz (73.2 kg)  01/18/22 160 lb 14.4 oz (73 kg)  11/17/21 161 lb 12 oz (73.4 kg)    Body mass index is 24.54 kg/m.  Performance status (ECOG): 1 - Symptomatic but completely ambulatory  PHYSICAL EXAM:   Physical Exam Constitutional:      Appearance: Normal appearance.  HENT:     Nose: Nose normal.     Mouth/Throat:     Mouth: Mucous membranes are moist.     Pharynx: Oropharynx is clear.    Neck: He does have firmness and induration of the neck. Eyes:     General: No scleral icterus.    Extraocular Movements: Extraocular movements intact.     Conjunctiva/sclera: Conjunctivae normal.     Pupils: Pupils are equal, round, and reactive to light.  Cardiovascular:     Rate and Rhythm: Normal rate and regular rhythm.     Pulses: Normal pulses.     Heart sounds: Normal heart sounds. No murmur heard.    No gallop.  Pulmonary:     Effort: Pulmonary effort is normal. No respiratory distress.     Breath sounds: Normal breath sounds. No wheezing or rales.     Comments:  Musculoskeletal:        General: Normal range of motion.     Cervical back: Normal range of motion and neck supple.  Skin:    General: Skin is warm and dry.  Neurological:     Mental Status: He is alert and oriented to person, place, and time.  Psychiatric:        Mood and Affect: Mood normal.        Behavior: Behavior normal.        Thought Content: Thought content normal.        Judgment: Judgment normal.     LABORATORY DATA:  I have reviewed the data as listed    Component Value Date/Time   NA 142 04/19/2022 0000   K 4.0 04/19/2022 0000   CL 107 04/19/2022 0000   CO2 25 (A) 04/19/2022 0000   GLUCOSE 92 01/18/2022 0858   BUN 23 (A) 04/19/2022 0000   CREATININE 0.8 04/19/2022 0000   CREATININE 0.68 01/18/2022 0858   CALCIUM 9.2 04/19/2022 0000   PROT 8.1 01/18/2022 0858   ALBUMIN 4.3  04/19/2022 0000   AST 23 04/19/2022 0000   AST 16 01/18/2022 0858   ALT 16 04/19/2022 0000  ALT 14 01/18/2022 0858   ALKPHOS 48 04/19/2022 0000   BILITOT 0.5 01/18/2022 0858   GFRNONAA >60 01/18/2022 0858   GFRAA >60 05/17/2016 2106    No results found for: "SPEP", "UPEP"  Lab Results  Component Value Date   WBC 6.4 04/19/2022   NEUTROABS 5.06 04/19/2022   HGB 10.6 (A) 04/19/2022   HCT 33 (A) 04/19/2022   MCV 90.9 01/18/2022   PLT 239 04/19/2022      Chemistry      Component Value Date/Time   NA 142 04/19/2022 0000   K 4.0 04/19/2022 0000   CL 107 04/19/2022 0000   CO2 25 (A) 04/19/2022 0000   BUN 23 (A) 04/19/2022 0000   CREATININE 0.8 04/19/2022 0000   CREATININE 0.68 01/18/2022 0858   GLU 115 04/19/2022 0000      Component Value Date/Time   CALCIUM 9.2 04/19/2022 0000   ALKPHOS 48 04/19/2022 0000   AST 23 04/19/2022 0000   AST 16 01/18/2022 0858   ALT 16 04/19/2022 0000   ALT 14 01/18/2022 0858   BILITOT 0.5 01/18/2022 0858       RADIOGRAPHIC STUDIES:  EXAM  04/19/22 CT NECK WITH CONTRAST  Impression: Resolving posttreatment changes without evidence of local recurrence or metastatic disease in the neck. Emphysema   I,Gabriella Ballesteros,acting as a scribe for [name], MD.,have documented all relevant documentation on the behalf of [name], MD,as directed by  [name], MD while in the presence of [name], MD.

## 2022-05-02 ENCOUNTER — Telehealth: Payer: Self-pay

## 2022-05-02 NOTE — Telephone Encounter (Signed)
Attempted to contact patient. No answer and no VM.  

## 2022-05-02 NOTE — Telephone Encounter (Signed)
-----   Message from [name], MD sent at 04/28/2022  7:01 PM EDT ----- Regarding: call Blood looks good but anemia is worse (red cells lower) Is he taking the iron? He needs to

## 2022-05-03 ENCOUNTER — Encounter: Payer: Self-pay | Admitting: Oncology

## 2022-05-05 ENCOUNTER — Ambulatory Visit: Payer: 59 | Admitting: Podiatry

## 2022-05-05 ENCOUNTER — Telehealth: Payer: Self-pay

## 2022-05-05 NOTE — Telephone Encounter (Signed)
Detailed message left for patient to call us back.

## 2022-05-05 NOTE — Telephone Encounter (Signed)
-----   Message from Belva Chimes, LPN sent at [id number]  2:11 PM EDT ----- Regarding: FW: call  ----- Message ----- From: [name], MD Sent: 04/28/2022   7:02 PM EDT To: Belva Chimes, LPN Subject: call                                           Blood looks good but anemia is worse (red cells lower) Is he taking the iron? He needs to

## 2022-05-09 DIAGNOSIS — H402233 Chronic angle-closure glaucoma, bilateral, severe stage: Secondary | ICD-10-CM | POA: Diagnosis not present

## 2022-05-09 DIAGNOSIS — Z79899 Other long term (current) drug therapy: Secondary | ICD-10-CM | POA: Diagnosis not present

## 2022-05-09 DIAGNOSIS — Z760 Encounter for issue of repeat prescription: Secondary | ICD-10-CM | POA: Diagnosis not present

## 2022-05-09 DIAGNOSIS — C109 Malignant neoplasm of oropharynx, unspecified: Secondary | ICD-10-CM | POA: Diagnosis not present

## 2022-05-09 DIAGNOSIS — M545 Low back pain, unspecified: Secondary | ICD-10-CM | POA: Diagnosis not present

## 2022-05-09 DIAGNOSIS — M5136 Other intervertebral disc degeneration, lumbar region: Secondary | ICD-10-CM | POA: Diagnosis not present

## 2022-05-09 DIAGNOSIS — G8929 Other chronic pain: Secondary | ICD-10-CM | POA: Diagnosis not present

## 2022-05-11 ENCOUNTER — Encounter: Payer: Self-pay | Admitting: Podiatry

## 2022-05-11 ENCOUNTER — Ambulatory Visit (INDEPENDENT_AMBULATORY_CARE_PROVIDER_SITE_OTHER): Payer: 59 | Admitting: Podiatry

## 2022-05-11 DIAGNOSIS — M79676 Pain in unspecified toe(s): Secondary | ICD-10-CM

## 2022-05-11 DIAGNOSIS — B351 Tinea unguium: Secondary | ICD-10-CM

## 2022-05-11 DIAGNOSIS — E119 Type 2 diabetes mellitus without complications: Secondary | ICD-10-CM | POA: Diagnosis not present

## 2022-05-11 NOTE — Progress Notes (Signed)
  Subjective:  Patient ID: David Kelly, male    DOB: 03-28-1945,   MRN: [id number]  No chief complaint on file.   77 y.o. male presents for concern of thickened elongated and painful nails that are difficult to trim. Requesting to have them trimmed today. Relates burning and tingling in their feet. Patient is diabetic and last A1c was  Lab Results  Component Value Date   HGBA1C 8.6 (H) 04/29/2020   .   PCP:  Biagio Borg, MD    . Denies any other pedal complaints. Denies n/v/f/c.   Past Medical History:  Diagnosis Date   Allergy    Arthritis    Blood transfusion without reported diagnosis    with bleeding colon per pt in the past    BPH (benign prostatic hypertrophy)    Cancer ([facility])    head/neck   Diabetes mellitus    DX  5 YR AGO  Type II   Glaucoma    H/O: GI bleed    History of kidney stones    HLD (hyperlipidemia) 09/28/2017   Myocardial infarction ([place])    in the 1990's    Objective:  Physical Exam: Vascular: DP/PT pulses 2/4 bilateral. CFT <3 seconds. Absent hair growth on digits. Edema noted to bilateral lower extremities. Xerosis noted bilaterally.  Skin. No lacerations or abrasions bilateral feet. Nails 1-5 bilateral  are thickened discolored and elongated with subungual debris.  Musculoskeletal: MMT 5/5 bilateral lower extremities in DF, PF, Inversion and Eversion. Deceased ROM in DF of ankle joint.  Neurological: Sensation intact to light touch. Protective sensation  intact bilateral.     Assessment:   1. Pain due to onychomycosis of toenail   2. Diabetes mellitus without complication ([place])      Plan:  Patient was evaluated and treated and all questions answered. -Discussed and educated patient on diabetic foot care, especially with  regards to the vascular, neurological and musculoskeletal systems.  -Stressed the importance of good glycemic control and the detriment of not  controlling glucose levels in relation to the foot. -Discussed supportive  shoes at all times and checking feet regularly.  -Mechanically debrided all nails 1-5 bilateral using sterile nail nipper and filed with dremel without incident  -Answered all patient questions -Patient to return  in 3 months for at risk foot care -Patient advised to call the office if any problems or questions arise in the meantime.   Lorenda Peck, DPM

## 2022-05-18 ENCOUNTER — Encounter: Payer: Self-pay | Admitting: Oncology

## 2022-05-18 ENCOUNTER — Other Ambulatory Visit: Payer: Self-pay | Admitting: Oncology

## 2022-05-18 DIAGNOSIS — C109 Malignant neoplasm of oropharynx, unspecified: Secondary | ICD-10-CM

## 2022-05-18 DIAGNOSIS — D539 Nutritional anemia, unspecified: Secondary | ICD-10-CM

## 2022-05-26 DIAGNOSIS — H524 Presbyopia: Secondary | ICD-10-CM | POA: Diagnosis not present

## 2022-05-26 DIAGNOSIS — E119 Type 2 diabetes mellitus without complications: Secondary | ICD-10-CM | POA: Diagnosis not present

## 2022-05-26 DIAGNOSIS — M25511 Pain in right shoulder: Secondary | ICD-10-CM | POA: Diagnosis not present

## 2022-06-02 DIAGNOSIS — Z6824 Body mass index (BMI) 24.0-24.9, adult: Secondary | ICD-10-CM | POA: Diagnosis not present

## 2022-06-02 DIAGNOSIS — K0889 Other specified disorders of teeth and supporting structures: Secondary | ICD-10-CM | POA: Diagnosis not present

## 2022-06-02 DIAGNOSIS — M25511 Pain in right shoulder: Secondary | ICD-10-CM | POA: Diagnosis not present

## 2022-06-07 DIAGNOSIS — Z01818 Encounter for other preprocedural examination: Secondary | ICD-10-CM | POA: Diagnosis not present

## 2022-06-07 DIAGNOSIS — H25812 Combined forms of age-related cataract, left eye: Secondary | ICD-10-CM | POA: Diagnosis not present

## 2022-06-07 DIAGNOSIS — H25811 Combined forms of age-related cataract, right eye: Secondary | ICD-10-CM | POA: Diagnosis not present

## 2022-06-07 DIAGNOSIS — H401122 Primary open-angle glaucoma, left eye, moderate stage: Secondary | ICD-10-CM | POA: Diagnosis not present

## 2022-06-08 DIAGNOSIS — M545 Low back pain, unspecified: Secondary | ICD-10-CM | POA: Diagnosis not present

## 2022-06-08 DIAGNOSIS — M25561 Pain in right knee: Secondary | ICD-10-CM | POA: Diagnosis not present

## 2022-06-08 DIAGNOSIS — Z79899 Other long term (current) drug therapy: Secondary | ICD-10-CM | POA: Diagnosis not present

## 2022-06-08 DIAGNOSIS — G8929 Other chronic pain: Secondary | ICD-10-CM | POA: Diagnosis not present

## 2022-06-23 DIAGNOSIS — H25812 Combined forms of age-related cataract, left eye: Secondary | ICD-10-CM | POA: Diagnosis not present

## 2022-06-23 DIAGNOSIS — H401122 Primary open-angle glaucoma, left eye, moderate stage: Secondary | ICD-10-CM | POA: Diagnosis not present

## 2022-07-07 ENCOUNTER — Other Ambulatory Visit: Payer: Self-pay

## 2022-07-07 DIAGNOSIS — R053 Chronic cough: Secondary | ICD-10-CM

## 2022-07-07 MED ORDER — GUAIFENESIN-CODEINE 100-10 MG/5ML PO SOLN: 5.0000 mL | Freq: Four times a day (QID) | ORAL | 0 refills | Status: DC | PRN

## 2022-07-08 DIAGNOSIS — N200 Calculus of kidney: Secondary | ICD-10-CM | POA: Diagnosis not present

## 2022-07-08 DIAGNOSIS — M545 Low back pain, unspecified: Secondary | ICD-10-CM | POA: Diagnosis not present

## 2022-07-08 DIAGNOSIS — G8929 Other chronic pain: Secondary | ICD-10-CM | POA: Diagnosis not present

## 2022-07-08 DIAGNOSIS — Z79899 Other long term (current) drug therapy: Secondary | ICD-10-CM | POA: Diagnosis not present

## 2022-07-08 DIAGNOSIS — M25561 Pain in right knee: Secondary | ICD-10-CM | POA: Diagnosis not present

## 2022-07-12 ENCOUNTER — Other Ambulatory Visit: Payer: Self-pay | Admitting: Oncology

## 2022-07-12 ENCOUNTER — Telehealth: Payer: Self-pay

## 2022-07-12 DIAGNOSIS — R053 Chronic cough: Secondary | ICD-10-CM

## 2022-07-12 MED ORDER — GUAIFENESIN-CODEINE 100-10 MG/5ML PO SOLN: 5.0000 mL | Freq: Four times a day (QID) | ORAL | 0 refills | Status: DC | PRN

## 2022-07-12 NOTE — Telephone Encounter (Signed)
-----   Message from Dellia Beckwith, MD sent at 07/12/2022  1:01 PM EDT ----- Regarding: RE: cough medicine done ----- Message ----- From: Dyane Dustman, RN Sent: 07/12/2022  10:30 AM EDT To: Dellia Beckwith, MD Subject: FW: cough medicine                             Will you please send back to [facility], they have told [facility] that they do not have a prescription for this even though you sent it on May 23.  Thanks   ----- Message ----- From: Mady Haagensen Sent: 07/12/2022   9:28 AM EDT To: Dyane Dustman, RN Subject: RE: cough medicine                             Yes, I can call [facility] and let them know we will cover the cost of medication. Will also let Mr. Yadav know, thanks. ----- Message ----- From: Dyane Dustman, RN Sent: 07/12/2022   8:38 AM EDT To: Mady Haagensen Subject: cough medicine                                 [facility] Mr. Felipe is having a hard time getting this prescription of quaifenesin / codeine 100/10mg  per 5ml.  [facility] is the only pharmacy that seems to carry it and they told him it would be $44.00 which he doesn't have.  Do we have any funds to cover this for him?

## 2022-07-14 DIAGNOSIS — H401111 Primary open-angle glaucoma, right eye, mild stage: Secondary | ICD-10-CM | POA: Diagnosis not present

## 2022-07-14 DIAGNOSIS — H25811 Combined forms of age-related cataract, right eye: Secondary | ICD-10-CM | POA: Diagnosis not present

## 2022-07-14 DIAGNOSIS — H409 Unspecified glaucoma: Secondary | ICD-10-CM | POA: Diagnosis not present

## 2022-07-14 DIAGNOSIS — H401110 Primary open-angle glaucoma, right eye, stage unspecified: Secondary | ICD-10-CM | POA: Diagnosis not present

## 2022-07-21 NOTE — Progress Notes (Unsigned)
[name]  [address] [place],  [place]  [address] [phone]  Clinic Day:  07/22/2022  Referring physician: Corwin Levins, MD  ASSESSMENT & PLAN:   Assessment & Plan: No problem-specific Assessment & Plan notes found for this encounter.    The patient understands the plans discussed today and is in agreement with them.  He knows to contact our office if he develops concerns prior to his next appointment.   I provided *** minutes of face-to-face time during this encounter and > 50% was spent counseling as documented under my assessment and plan.    Adah Perl, PA-C  [facility] AT [facility] [address] [place] [address] Dept: [phone] Dept Fax: [phone]   No orders of the defined types were placed in this encounter.     CHIEF COMPLAINT:  CC: Stage I HPV mediated oropharyngeal cancer  Current Treatment: Surveillance  HISTORY OF PRESENT ILLNESS:  David Kelly is a 77 y.o. 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 The patient was able to palpate swelling of the right side of his neck. Dr. Christia Reading, otolaryngology, performed an FNA. Pathology from this procedure revealed rare atypical cells of undermined significance. Repeat FNA was performed in August revealed acute and chronic inflammation, with no malignant cell identified.  Excisional right neck lymph node biopsy was done in September.  Final pathology confirmed metastatic squamous cell carcinoma associated with scanty lymphoid tissue.  Laryngoscopy that revealed a mass involving the right lateral pharyngeal wall inferior to the tonsillar region and extending to the midline of the vallecular.  PET revealed increased radiotracer uptake in the patient's right hypopharynx, with a 3.2 cm solitary hypermetabolic metastatic lymph node in the right level 2 nodal  region. No distant metastatic disease was seen. He was seen in consultation by Dr. Basilio Cairo and Dr. Bertis Ruddy and concurrent chemoradiation with weekly cisplatin was recommended. As the patient lives locally, he transferred his care to our office.  He received concurrent chemoradiation with weekly cisplatin completed in December 2022.  CT neck in February 2023 revealed enhancement previous tumor, but otherwise no evidence of disease.  He saw Dr. Jenne Pane in April 2023 and no evidence of persistent or recurrent disease was seen on direct examination.  CT neck in August and March did not reveal any evidence of recurrence.   Oncology History Overview Note  Immunohistochemistry for p16 is strongly positive.    Oropharyngeal cancer (HCC)  09/10/2020 Initial Diagnosis   He presented with a palpable knot in his upper right neck to Dr. Jenne Pane on 09/10/20. Patient denied any pain at that time, though did report some difficulty swallowing.     10/20/2020 Pathology Results   FINAL MICROSCOPIC DIAGNOSIS:   A. LYMPH NODE, RIGHT NECK, NEEDLE CORE BIOPSY:  - Metastatic squamous cell carcinoma associated with scanty lymphoid tissue.   10/20/2020 Procedure   Technically successful ultrasound guided biopsy of indeterminate right neck mass/nodal conglomeration.   10/22/2020 Procedure   The fiberoptic laryngoscope was then placed through the nasal passage to view the pharynx and larynx. After completion, the telescope was removed. Findings included normal nasal passages and no mass or abnormality in the nasopharynx. There is a granular mass involving the right lateral pharyngeal wall inferior to tonsillar area and extending to the midline of the vallecula. The larynx appears normal. Pyriform sinuses are open. Secretions are  minimal. Vocal folds are without mass, scarring, or ulceration. The vocal folds adduct and abduct symmetrically. There is good glottal closure. Muscle tension patterns are not present. Laryngeal edema is  minimal.    11/11/2020 PET scan   1. Hypermetabolic mass in the RIGHT hypopharynx localizing to the RIGHT tonsillar pillar and vallecula. 2. Solitary hypermetabolic metastatic lymph node to the RIGHT level II nodal station. 3. No distant metastatic disease.   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HCC)   11/16/2020 Cancer Staging   Staging form: Pharynx - HPV-Mediated Oropharynx, AJCC 8th Edition - Clinical stage from 11/16/2020: Stage I (cT2, cN1, cM0, p16+) - Signed by Lonie Peak, MD on 11/16/2020 Stage prefix: Initial diagnosis   12/22/2020 - 02/01/2021 Chemotherapy   Patient is on Treatment Plan : HEAD/NECK Cisplatin q7d         INTERVAL HISTORY:  David Kelly is here today for repeat clinical assessment and states Dr. Gilman Buttner recommended he see Dr. Jenne Pane again, but appointment did not get scheduled.  He denies palpable adenopathy.  He denies difficulty swallowing.  He denies acid reflux.  He reports a persistent cough especially in the mornings.  He requests a refill of his cough medicine.  He denies fevers or chills. He has chronic right arm and leg pain.  Continues to ambulate with a cane.  His appetite is remains decreased, but he is using Ensure twice daily. His weight has increased 2 pounds over last 3 months .  REVIEW OF SYSTEMS:  Review of Systems  Constitutional:  Positive for appetite change. Negative for chills, fatigue, fever and unexpected weight change.  HENT:   Negative for lump/mass, mouth sores and sore throat.   Respiratory:  Positive for cough. Negative for hemoptysis and shortness of breath.   Cardiovascular:  Negative for chest pain and leg swelling.  Gastrointestinal:  Negative for abdominal pain, constipation, diarrhea, nausea and vomiting.  Genitourinary:  Negative for difficulty urinating, dysuria, frequency and hematuria.   Musculoskeletal:  Positive for arthralgias. Negative for back pain and myalgias.  Skin:  Negative for itching,  rash and wound.  Neurological:  Negative for dizziness, extremity weakness, headaches, light-headedness and numbness.  Hematological:  Negative for adenopathy.  Psychiatric/Behavioral:  Negative for depression and sleep disturbance. The patient is not nervous/anxious.      VITALS:  Blood pressure 116/70, pulse 84, temperature 98.2 F (36.8 C), temperature source Oral, resp. rate 18, height 5\' 8"  (1.727 m), weight 163 lb 9.6 oz (74.2 kg), SpO2 99 %.  Wt Readings from Last 3 Encounters:  07/22/22 163 lb 9.6 oz (74.2 kg)  04/21/22 161 lb 6.4 oz (73.2 kg)  01/18/22 160 lb 14.4 oz (73 kg)    Body mass index is 24.88 kg/m.  Performance status (ECOG): 1 - Symptomatic but completely ambulatory  PHYSICAL EXAM:  Physical Exam Vitals and nursing note reviewed.  Constitutional:      General: He is not in acute distress.    Appearance: Normal appearance. He is normal weight.  HENT:     Head: Normocephalic and atraumatic.     Mouth/Throat:     Mouth: Mucous membranes are moist. No oral lesions.     Dentition: Abnormal dentition.     Tongue: No lesions.     Palate: No mass.     Pharynx: Oropharynx is clear. No pharyngeal swelling, oropharyngeal exudate or posterior oropharyngeal erythema.  Eyes:     General: No scleral icterus.    Extraocular Movements: Extraocular movements intact.  Conjunctiva/sclera: Conjunctivae normal.     Pupils: Pupils are equal, round, and reactive to light.  Cardiovascular:     Rate and Rhythm: Normal rate and regular rhythm.     Heart sounds: Normal heart sounds. No murmur heard.    No friction rub. No gallop.  Pulmonary:     Effort: Pulmonary effort is normal.     Breath sounds: Normal breath sounds. No wheezing, rhonchi or rales.  Abdominal:     General: Bowel sounds are normal. There is no distension.     Palpations: Abdomen is soft. There is no hepatomegaly, splenomegaly or mass.     Tenderness: There is no abdominal tenderness.  Musculoskeletal:         General: Normal range of motion.     Cervical back: Normal range of motion and neck supple. No tenderness.     Right lower leg: No edema.     Left lower leg: No edema.  Lymphadenopathy:     Cervical: No cervical adenopathy.     Upper Body:     Right upper body: No supraclavicular or axillary adenopathy.     Left upper body: No supraclavicular or axillary adenopathy.     Lower Body: No right inguinal adenopathy. No left inguinal adenopathy.  Skin:    General: Skin is warm and dry.     Coloration: Skin is not jaundiced.     Findings: No rash.  Neurological:     Mental Status: He is alert and oriented to person, place, and time.     Cranial Nerves: No cranial nerve deficit.  Psychiatric:        Mood and Affect: Mood normal.        Behavior: Behavior normal.        Thought Content: Thought content normal.     LABS:      Latest Ref Rng & Units 07/22/2022   10:03 AM 04/19/2022   12:00 AM 01/18/2022    8:58 AM  CBC  WBC 4.0 - 10.5 K/uL 5.8  6.4     6.8   Hemoglobin 13.0 - 17.0 g/dL 16.1  09.6     04.5   Hematocrit 39.0 - 52.0 % 36.2  33     39.0   Platelets 150 - 400 K/uL 249  239     239      This result is from an external source.      Latest Ref Rng & Units 07/22/2022   10:03 AM 04/19/2022   12:00 AM 01/18/2022    8:58 AM  CMP  Glucose 70 - 99 mg/dL 409   92   BUN 8 - 23 mg/dL 19  23     13    Creatinine 0.61 - 1.24 mg/dL 8.11  0.8     9.14   Sodium 135 - 145 mmol/L 142  142     140   Potassium 3.5 - 5.1 mmol/L 3.8  4.0     3.5   Chloride 98 - 111 mmol/L 108  107     103   CO2 22 - 32 mmol/L 24  25     27    Calcium 8.9 - 10.3 mg/dL 9.3  9.2     9.6   Total Protein 6.5 - 8.1 g/dL 7.4   8.1   Total Bilirubin 0.3 - 1.2 mg/dL 0.5   0.5   Alkaline Phos 38 - 126 U/L 63  48     56   AST 15 -  41 U/L 17  23     16    ALT 0 - 44 U/L 14  16     14       This result is from an external source.     No results found for: "CEA1", "CEA" / No results found for: "CEA1", "CEA" No  results found for: "PSA1" No results found for: "MVH846" No results found for: "CAN125"  No results found for: "TOTALPROTELP", "ALBUMINELP", "A1GS", "A2GS", "BETS", "BETA2SER", "GAMS", "MSPIKE", "SPEI" Lab Results  Component Value Date   TIBC 361 07/22/2022   TIBC 376 11/10/2021   TIBC 353 08/12/2021   FERRITIN 29 07/22/2022   FERRITIN 40 11/10/2021   FERRITIN 90 08/12/2021   IRONPCTSAT 12 (L) 07/22/2022   IRONPCTSAT 14 (L) 11/10/2021   IRONPCTSAT 17 (L) 08/12/2021   No results found for: "LDH"  STUDIES:  No results found.    HISTORY:   Past Medical History:  Diagnosis Date   Allergy    Arthritis    Blood transfusion without reported diagnosis    with bleeding colon per pt in the past    BPH (benign prostatic hypertrophy)    Cancer (HCC)    head/neck   Diabetes mellitus    DX  5 YR AGO  Type II   Glaucoma    H/O: GI bleed    History of kidney stones    HLD (hyperlipidemia) 09/28/2017   Myocardial infarction (HCC)    in the 1990's    Past Surgical History:  Procedure Laterality Date   COLONOSCOPY     CYSTOSCOPY WITH RETROGRADE PYELOGRAM, URETEROSCOPY AND STENT PLACEMENT Right 05/20/2016   Procedure: CYSTOSCOPY, URETEROSCOPY, TRANSURETHRAL INCISION OF URETHROCELE, BASKET STONE REMOVAL;  Surgeon: Bjorn Pippin, MD;  Location: [facility];  Service: Urology;  Laterality: Right;   Gall Bladder STones removed     left total knee replacement     MULTIPLE EXTRACTIONS WITH ALVEOLOPLASTY N/A 11/20/2020   Procedure: MULTIPLE EXTRACTION WITH ALVEOLOPLASTY;  Surgeon: Sharman Cheek, DMD;  Location: [facility] OR;  Service: Dentistry;  Laterality: N/A;   TOTAL KNEE ARTHROPLASTY  04/04/2011   Procedure: TOTAL KNEE ARTHROPLASTY;  Surgeon: Raymon Mutton, MD;  Location: [facility] OR;  Service: Orthopedics;  Laterality: Left;    Family History  Problem Relation Age of Onset   Arthritis Mother    Asthma Paternal Grandfather    Diabetes Sister    Colon polyps Brother    Colon cancer Neg Hx     Esophageal cancer Neg Hx    Rectal cancer Neg Hx    Stomach cancer Neg Hx     Social History:  reports that he quit smoking about 16 years ago. His smoking use included cigarettes. He has a 67.50 pack-year smoking history. He quit smokeless tobacco use about 3 years ago.  His smokeless tobacco use included chew. He reports that he does not drink alcohol and does not use drugs.The patient is alone  today.  Allergies: No Known Allergies  Current Medications: Current Outpatient Medications  Medication Sig Dispense Refill   Accu-Chek Softclix Lancets lancets See admin instructions.     Blood Glucose Monitoring Suppl (ACCU-CHEK GUIDE ME) w/Device KIT See admin instructions.     brimonidine-timolol (COMBIGAN) 0.2-0.5 % ophthalmic solution SMARTSIG:1 Drop(s) In Eye(s) Every 12 Hours     chlorhexidine (PERIDEX) 0.12 % solution 15 mLs 2 (two) times daily.     HYDROcodone-acetaminophen (NORCO) 10-325 MG tablet Take 1 tablet by mouth every 6 (six) hours as needed.  ketorolac (ACULAR) 0.5 % ophthalmic solution Place into the right eye.     ofloxacin (OCUFLOX) 0.3 % ophthalmic solution Place into the right eye.     prednisoLONE acetate (PRED FORTE) 1 % ophthalmic suspension Place into the right eye.     promethazine-codeine (PHENERGAN WITH CODEINE) 6.25-10 MG/5ML syrup TAKE BY MOUTH EVERY 4 HOURS AS NEEDED FOR COUGH     ACCU-CHEK GUIDE test strip See admin instructions.     atorvastatin (LIPITOR) 80 MG tablet Take by mouth.     diclofenac (VOLTAREN) 75 MG EC tablet Take 75 mg by mouth 2 (two) times daily as needed. (Patient not taking: Reported on 07/22/2022)     esomeprazole (NEXIUM) 40 MG capsule Take 40 mg by mouth daily at 12 noon.     ferrous sulfate 325 (65 FE) MG tablet Take 325 mg by mouth daily with breakfast. Informed patient to start taking iron supplement every day. 01/11/21     guaiFENesin-codeine 100-10 MG/5ML syrup Take 5 mLs by mouth every 6 (six) hours as needed for cough. 240 mL 0    metFORMIN (GLUCOPHAGE) 1000 MG tablet Take 1 tablet (1,000 mg total) by mouth 2 (two) times daily with a meal. 180 tablet 3   No current facility-administered medications for this visit.

## 2022-07-22 ENCOUNTER — Inpatient Hospital Stay: Payer: 59 | Attending: Hematology and Oncology | Admitting: Hematology and Oncology

## 2022-07-22 ENCOUNTER — Encounter: Payer: Self-pay | Admitting: Hematology and Oncology

## 2022-07-22 ENCOUNTER — Inpatient Hospital Stay: Payer: 59

## 2022-07-22 VITALS — BP 116/70 | HR 84 | Temp 98.2°F | Resp 18 | Ht 68.0 in | Wt 163.6 lb

## 2022-07-22 DIAGNOSIS — Z923 Personal history of irradiation: Secondary | ICD-10-CM | POA: Diagnosis not present

## 2022-07-22 DIAGNOSIS — D539 Nutritional anemia, unspecified: Secondary | ICD-10-CM

## 2022-07-22 DIAGNOSIS — C109 Malignant neoplasm of oropharynx, unspecified: Secondary | ICD-10-CM

## 2022-07-22 DIAGNOSIS — C770 Secondary and unspecified malignant neoplasm of lymph nodes of head, face and neck: Secondary | ICD-10-CM

## 2022-07-22 DIAGNOSIS — Z79899 Other long term (current) drug therapy: Secondary | ICD-10-CM | POA: Diagnosis not present

## 2022-07-22 DIAGNOSIS — Z85818 Personal history of malignant neoplasm of other sites of lip, oral cavity, and pharynx: Secondary | ICD-10-CM | POA: Insufficient documentation

## 2022-07-22 LAB — CMP (CANCER CENTER ONLY)
ALT: 14 U/L (ref 0–44)
AST: 17 U/L (ref 15–41)
Albumin: 4.1 g/dL (ref 3.5–5.0)
Alkaline Phosphatase: 63 U/L (ref 38–126)
Anion gap: 10 (ref 5–15)
BUN: 19 mg/dL (ref 8–23)
CO2: 24 mmol/L (ref 22–32)
Calcium: 9.3 mg/dL (ref 8.9–10.3)
Chloride: 108 mmol/L (ref 98–111)
Creatinine: 0.92 mg/dL (ref 0.61–1.24)
GFR, Estimated: >60 mL/min (ref >60)
Glucose, Bld: 134 mg/dL — ABNORMAL HIGH (ref 70–99)
Potassium: 3.8 mmol/L (ref 3.5–5.1)
Sodium: 142 mmol/L (ref 135–145)
Total Bilirubin: 0.5 mg/dL (ref 0.3–1.2)
Total Protein: 7.4 g/dL (ref 6.5–8.1)

## 2022-07-22 LAB — CBC WITH DIFFERENTIAL (CANCER CENTER ONLY)
Abs Immature Granulocytes: 0.03 10*3/uL (ref 0.00–0.07)
Basophils Absolute: 0 10*3/uL (ref 0.0–0.1)
Basophils Relative: 0 %
Eosinophils Absolute: 0.1 10*3/uL (ref 0.0–0.5)
Eosinophils Relative: 2 %
HCT: 36.2 % — ABNORMAL LOW (ref 39.0–52.0)
Hemoglobin: 11.2 g/dL — ABNORMAL LOW (ref 13.0–17.0)
Immature Granulocytes: 1 %
Lymphocytes Relative: 10 %
Lymphs Abs: 0.6 10*3/uL — ABNORMAL LOW (ref 0.7–4.0)
MCH: 27.5 pg (ref 26.0–34.0)
MCHC: 30.9 g/dL (ref 30.0–36.0)
MCV: 88.7 fL (ref 80.0–100.0)
Monocytes Absolute: 0.3 10*3/uL (ref 0.1–1.0)
Monocytes Relative: 6 %
Neutro Abs: 4.8 10*3/uL (ref 1.7–7.7)
Neutrophils Relative %: 81 %
Platelet Count: 249 10*3/uL (ref 150–400)
RBC: 4.08 MIL/uL — ABNORMAL LOW (ref 4.22–5.81)
RDW: 14.8 % (ref 11.5–15.5)
WBC Count: 5.8 10*3/uL (ref 4.0–10.5)
nRBC: 0 % (ref 0.0–0.2)

## 2022-07-22 LAB — IRON AND TIBC
Iron: 43 ug/dL — ABNORMAL LOW (ref 45–182)
Saturation Ratios: 12 % — ABNORMAL LOW (ref 17.9–39.5)
TIBC: 361 ug/dL (ref 250–450)
UIBC: 318 ug/dL

## 2022-07-22 LAB — FERRITIN: Ferritin: 29 ng/mL (ref 24–336)

## 2022-07-22 LAB — VITAMIN B12: Vitamin B-12: 197 pg/mL (ref 180–914)

## 2022-07-22 LAB — FOLATE: Folate: 6.4 ng/mL (ref >5.9)

## 2022-07-22 NOTE — Assessment & Plan Note (Signed)
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  He completed concurrent chemoradiation with weekly cisplatin in December 2022.  His weight has stabilized.  He remains without evidence of recurrence.  I will have him see Dr. Jenne Pane for follow-up again at this time.  We will plan to see him back in 3 months with a CBC, comprehensive metabolic panel and CT neck.

## 2022-07-25 ENCOUNTER — Encounter: Payer: Self-pay | Admitting: Hematology and Oncology

## 2022-07-25 ENCOUNTER — Encounter: Payer: Self-pay | Admitting: Oncology

## 2022-07-25 ENCOUNTER — Telehealth: Payer: Self-pay

## 2022-07-25 DIAGNOSIS — C109 Malignant neoplasm of oropharynx, unspecified: Secondary | ICD-10-CM

## 2022-07-25 NOTE — Assessment & Plan Note (Signed)
Chronic anemia. His hemoglobin has improved. Iron studies are equivocal, so I will add a soluble transferrin today to further clarify.

## 2022-07-25 NOTE — Telephone Encounter (Signed)
Referral faxed

## 2022-07-25 NOTE — Telephone Encounter (Signed)
-----   Message from Adah Perl, PA-C sent at 07/25/2022  3:24 PM EDT ----- Please refer to Dr. Jenne Pane for f/u of oropharyngeal cancer. Thanks

## 2022-07-26 DIAGNOSIS — N200 Calculus of kidney: Secondary | ICD-10-CM | POA: Diagnosis not present

## 2022-08-05 DIAGNOSIS — C109 Malignant neoplasm of oropharynx, unspecified: Secondary | ICD-10-CM | POA: Diagnosis not present

## 2022-08-08 DIAGNOSIS — Z79899 Other long term (current) drug therapy: Secondary | ICD-10-CM | POA: Diagnosis not present

## 2022-08-08 DIAGNOSIS — M25561 Pain in right knee: Secondary | ICD-10-CM | POA: Diagnosis not present

## 2022-08-08 DIAGNOSIS — M47816 Spondylosis without myelopathy or radiculopathy, lumbar region: Secondary | ICD-10-CM | POA: Diagnosis not present

## 2022-08-08 DIAGNOSIS — G8929 Other chronic pain: Secondary | ICD-10-CM | POA: Diagnosis not present

## 2022-08-08 DIAGNOSIS — M170 Bilateral primary osteoarthritis of knee: Secondary | ICD-10-CM | POA: Diagnosis not present

## 2022-08-11 ENCOUNTER — Ambulatory Visit: Payer: 59 | Admitting: Podiatry

## 2022-08-16 ENCOUNTER — Ambulatory Visit (INDEPENDENT_AMBULATORY_CARE_PROVIDER_SITE_OTHER): Payer: 59 | Admitting: Podiatry

## 2022-08-16 DIAGNOSIS — B351 Tinea unguium: Secondary | ICD-10-CM | POA: Diagnosis not present

## 2022-08-16 DIAGNOSIS — M79676 Pain in unspecified toe(s): Secondary | ICD-10-CM

## 2022-08-16 DIAGNOSIS — E119 Type 2 diabetes mellitus without complications: Secondary | ICD-10-CM

## 2022-08-16 NOTE — Progress Notes (Signed)
  Subjective:  Patient ID: David Kelly, male    DOB: Jul 02, 1945,  MRN: [id number]  Chief Complaint  Patient presents with   Nail Problem    Diabetic Foot Care- Nail trim. Patient administers oral medication for diabetes control.     77 y.o. male presents with the above complaint. History confirmed with patient. Patient presenting with pain related to dystrophic thickened elongated nails. Patient is unable to trim own nails related to nail dystrophy and/or mobility issues. Patient does does have a history of T2DM.   Objective:  Physical Exam: warm, good capillary refill nail exam onychomycosis of the toenails, onycholysis, and dystrophic nails DP pulses palpable, PT pulses palpable, and protective sensation absent Left Foot:  Pain with palpation of nails due to elongation and dystrophic growth.  Right Foot: Pain with palpation of nails due to elongation and dystrophic growth.   Assessment:   1. Pain due to onychomycosis of toenail   2. Diabetes mellitus without complication (HCC)      Plan:  Patient was evaluated and treated and all questions answered.  #Onychomycosis with pain  -Nails palliatively debrided as below. -Educated on self-care  Procedure: Nail Debridement Rationale: Pain Type of Debridement: manual, sharp debridement. Instrumentation: Nail nipper, rotary burr. Number of Nails: 10  Return in about 3 months (around 11/16/2022) for [facility].         Corinna Gab, DPM [facility] / [facility]

## 2022-09-05 DIAGNOSIS — M170 Bilateral primary osteoarthritis of knee: Secondary | ICD-10-CM | POA: Diagnosis not present

## 2022-09-05 DIAGNOSIS — Z79899 Other long term (current) drug therapy: Secondary | ICD-10-CM | POA: Diagnosis not present

## 2022-09-05 DIAGNOSIS — G8929 Other chronic pain: Secondary | ICD-10-CM | POA: Diagnosis not present

## 2022-09-05 DIAGNOSIS — M47816 Spondylosis without myelopathy or radiculopathy, lumbar region: Secondary | ICD-10-CM | POA: Diagnosis not present

## 2022-09-05 DIAGNOSIS — M25561 Pain in right knee: Secondary | ICD-10-CM | POA: Diagnosis not present

## 2022-09-08 DIAGNOSIS — E785 Hyperlipidemia, unspecified: Secondary | ICD-10-CM | POA: Diagnosis not present

## 2022-09-08 DIAGNOSIS — E1149 Type 2 diabetes mellitus with other diabetic neurological complication: Secondary | ICD-10-CM | POA: Diagnosis not present

## 2022-09-11 DIAGNOSIS — S91302A Unspecified open wound, left foot, initial encounter: Secondary | ICD-10-CM | POA: Diagnosis not present

## 2022-09-11 DIAGNOSIS — M79672 Pain in left foot: Secondary | ICD-10-CM | POA: Diagnosis not present

## 2022-09-11 DIAGNOSIS — Z5321 Procedure and treatment not carried out due to patient leaving prior to being seen by health care provider: Secondary | ICD-10-CM | POA: Diagnosis not present

## 2022-09-12 DIAGNOSIS — S91332A Puncture wound without foreign body, left foot, initial encounter: Secondary | ICD-10-CM | POA: Diagnosis not present

## 2022-09-13 DIAGNOSIS — Z8673 Personal history of transient ischemic attack (TIA), and cerebral infarction without residual deficits: Secondary | ICD-10-CM | POA: Diagnosis not present

## 2022-09-13 DIAGNOSIS — E785 Hyperlipidemia, unspecified: Secondary | ICD-10-CM | POA: Diagnosis not present

## 2022-09-13 DIAGNOSIS — E1129 Type 2 diabetes mellitus with other diabetic kidney complication: Secondary | ICD-10-CM | POA: Diagnosis not present

## 2022-09-13 DIAGNOSIS — R809 Proteinuria, unspecified: Secondary | ICD-10-CM | POA: Diagnosis not present

## 2022-09-15 ENCOUNTER — Telehealth: Payer: Self-pay | Admitting: *Deleted

## 2022-09-15 NOTE — Telephone Encounter (Signed)
Transition Care Management Unsuccessful Follow-up Telephone Call  Date of discharge and from where:  [facility] 09/11/2022  Attempts:  2nd Attempt  Reason for unsuccessful TCM follow-up call:  Left voice message

## 2022-10-06 DIAGNOSIS — G8929 Other chronic pain: Secondary | ICD-10-CM | POA: Diagnosis not present

## 2022-10-06 DIAGNOSIS — M25561 Pain in right knee: Secondary | ICD-10-CM | POA: Diagnosis not present

## 2022-10-06 DIAGNOSIS — Z79899 Other long term (current) drug therapy: Secondary | ICD-10-CM | POA: Diagnosis not present

## 2022-10-06 DIAGNOSIS — M170 Bilateral primary osteoarthritis of knee: Secondary | ICD-10-CM | POA: Diagnosis not present

## 2022-10-06 DIAGNOSIS — M47816 Spondylosis without myelopathy or radiculopathy, lumbar region: Secondary | ICD-10-CM | POA: Diagnosis not present

## 2022-10-21 DIAGNOSIS — I700 Atherosclerosis of aorta: Secondary | ICD-10-CM | POA: Diagnosis not present

## 2022-10-21 DIAGNOSIS — J432 Centrilobular emphysema: Secondary | ICD-10-CM | POA: Diagnosis not present

## 2022-10-21 DIAGNOSIS — J439 Emphysema, unspecified: Secondary | ICD-10-CM | POA: Diagnosis not present

## 2022-10-21 DIAGNOSIS — C760 Malignant neoplasm of head, face and neck: Secondary | ICD-10-CM | POA: Diagnosis not present

## 2022-10-21 DIAGNOSIS — C109 Malignant neoplasm of oropharynx, unspecified: Secondary | ICD-10-CM | POA: Diagnosis not present

## 2022-10-21 DIAGNOSIS — M799 Soft tissue disorder, unspecified: Secondary | ICD-10-CM | POA: Diagnosis not present

## 2022-10-21 LAB — CBC AND DIFFERENTIAL
HCT: 35 — AB (ref 41–53)
Hemoglobin: 11.5 — AB (ref 13.5–17.5)
Neutrophils Absolute: 5.01
Platelets: 240 10*3/uL (ref 150–400)
WBC: 6.5

## 2022-10-21 LAB — BASIC METABOLIC PANEL
BUN: 13 (ref 4–21)
CO2: 26 — AB (ref 13–22)
Chloride: 104 (ref 99–108)
Creatinine: 0.7 (ref 0.6–1.3)
EGFR: 60
Glucose: 111
Potassium: 3.6 meq/L (ref 3.5–5.1)
Sodium: 140 (ref 137–147)

## 2022-10-21 LAB — HEPATIC FUNCTION PANEL
ALT: 17 U/L (ref 10–40)
AST: 25 (ref 14–40)
Alkaline Phosphatase: 64 (ref 25–125)
Bilirubin, Total: 0.7

## 2022-10-21 LAB — CBC: RBC: 4.19 (ref 3.87–5.11)

## 2022-10-21 LAB — COMPREHENSIVE METABOLIC PANEL
Albumin: 4.5 (ref 3.5–5.0)
Calcium: 9.3 (ref 8.7–10.7)

## 2022-10-31 ENCOUNTER — Inpatient Hospital Stay: Payer: 59 | Attending: Hematology and Oncology | Admitting: Hematology and Oncology

## 2022-10-31 ENCOUNTER — Encounter: Payer: Self-pay | Admitting: Hematology and Oncology

## 2022-10-31 ENCOUNTER — Encounter: Payer: Self-pay | Admitting: Oncology

## 2022-10-31 VITALS — BP 118/73 | HR 80 | Temp 97.8°F | Resp 18 | Ht 68.0 in | Wt 174.0 lb

## 2022-10-31 DIAGNOSIS — C109 Malignant neoplasm of oropharynx, unspecified: Secondary | ICD-10-CM | POA: Diagnosis not present

## 2022-10-31 DIAGNOSIS — D539 Nutritional anemia, unspecified: Secondary | ICD-10-CM

## 2022-10-31 DIAGNOSIS — R058 Other specified cough: Secondary | ICD-10-CM | POA: Diagnosis not present

## 2022-10-31 DIAGNOSIS — R052 Subacute cough: Secondary | ICD-10-CM | POA: Diagnosis not present

## 2022-10-31 DIAGNOSIS — C140 Malignant neoplasm of pharynx, unspecified: Secondary | ICD-10-CM | POA: Diagnosis not present

## 2022-10-31 DIAGNOSIS — Z87891 Personal history of nicotine dependence: Secondary | ICD-10-CM | POA: Diagnosis not present

## 2022-10-31 NOTE — Progress Notes (Signed)
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  He completed concurrent chemoradiation with weekly cisplatin in December 2022.  His weight has improved.  He remains without clinical evidence of recurrence.  CT neck from September 6 results pending-will contact him when available.  We will plan to see him back in 3 months with a CBC, comprehensive metabolic panel, ferritin, B12 and folate.  Deficiency anemia Chronic anemia. S/P IV Feraheme in October 2023. Iron studies were equivocal in June, but soluble transferrin did not get added. B12 was low at 197, but did not get B12 started. Hemoglobin from September 6 stable.  Iron studies and B12 not repeated at the time of his CT scan.  Patient does not wish to have phlebotomy for further laboratory studies today.  He does not wish to receive B12 injections.  I will, therefore, start him on oral B12 tabs 1000 mcg daily.  Will plan to repeat nutritional studies at his next visit.  Cough Patient reports dry cough for about 2 months.  Stat chest x-ray did not reveal any active cardiopulmonary disease.  History of tobacco abuse The patient reports smoking 1/2 ppd cigarettes for 15 years. He states he quit smoking about 5 years ago. Per his chart he has over a 20 pack year history of smoking.  His last CT chest in February 2016 as a 31-month follow up of a right middle lobe nodule revealed stable probable perifissural lymph node abutting the minor fissure of the right middle lobe measuring only 5 mm. No additional pulmonary nodule seen. Mild changes of centrilobular emphysema. Calcifications in the distribution of the left anterior descending artery. Single gallstone within the  gallbladder. Probable small nonobstructing left renal calculus.  The patient is on atorvastatin for dyslipidemia.  Further CT chest had not been done.  PET/CT in September 2022 did not reveal any concerning nodules or hypermetabolic activity in the lung.  Randomized trial evidence supports screening up to age 86, but there is uncertainty about the upper age limit to continue screening for lung cancer.  Chest x-ray today is negative.  I do not recommend low-dose CT lung at this time.     The patient understands the plans discussed today and is in agreement with them.  He knows to contact our office if he develops concerns prior to his next appointment.   I provided 30 minutes of face-to-face time during this encounter and > 50% was spent counseling as documented under my assessment and plan.    Adah Perl, PA-C  [facility] AT [facility] [address] [place] [address] Dept: [phone] Dept Fax: [phone]   Orders Placed This Encounter  Procedures   DG Chest 2 View    Standing Status:   Future    Standing Expiration Date:   10/31/2023    Scheduling Instructions:     RH    Order Specific Question:   Reason for Exam (SYMPTOM  OR DIAGNOSIS REQUIRED)    Answer:   cough    Order Specific Question:   Preferred imaging location?    Answer:   External   CBC with Differential (Cancer Center Only)    Standing Status:   Future  Standing Expiration Date:   11/01/2023   CMP (Cancer Center only)    Standing Status:   Future    Standing Expiration Date:   11/01/2023   Ferritin    Standing Status:   Future    Standing Expiration Date:   11/01/2023   Folate    Standing Status:   Future    Standing Expiration Date:   11/01/2023   Iron and TIBC    Standing Status:   Future    Standing Expiration Date:   11/01/2023   Vitamin B12    Standing Status:   Future    Standing Expiration Date:   11/01/2023      CHIEF COMPLAINT:  CC:  Stage I HPV mediated oropharyngeal cancer  Current Treatment: Surveillance  HISTORY OF PRESENT ILLNESS:  MANUS BERGTHOLD is a 77 y.o. 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 The patient was able to palpate swelling of the right side of his neck. Dr. Christia Reading, otolaryngology, performed an FNA. Pathology from this procedure revealed rare atypical cells of undermined significance. Repeat FNA was performed in August revealed acute and chronic inflammation, with no malignant cell identified.  Excisional right neck lymph node biopsy was done in September.  Final pathology confirmed metastatic squamous cell carcinoma associated with scanty lymphoid tissue.  Laryngoscopy that revealed a mass involving the right lateral pharyngeal wall inferior to the tonsillar region and extending to the midline of the vallecular.  PET revealed increased radiotracer uptake in the patient's right hypopharynx, with a 3.2 cm solitary hypermetabolic metastatic lymph node in the right level 2 nodal region. No distant metastatic disease was seen. He was seen in consultation by Dr. Basilio Cairo and Dr. Bertis Ruddy and concurrent chemoradiation with weekly cisplatin was recommended. As the patient lives locally, he transferred his care to our office.  He received concurrent chemoradiation with weekly cisplatin completed in December 2022.    CT neck in February 2023 revealed enhancement previous tumor, but otherwise no evidence of disease.  He saw Dr. Jenne Pane in April 2023 and no evidence of persistent or recurrent disease was seen on direct examination.  CT neck in August and March did not reveal any evidence of recurrence. 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   CT neck in March revealed resolving posttreatment changes without evidence of local recurrence or metastatic disease in the  neck with mild central lobar emphysema the upper chest.  I do not him to see Dr. Jenne Pane again in June at his last visit.   Oncology History Overview Note  Immunohistochemistry for p16 is strongly positive.    Oropharyngeal cancer (HCC)  09/10/2020 Initial Diagnosis   He presented with a palpable knot in his upper right neck to Dr. Jenne Pane on 09/10/20. Patient denied any pain at that time, though did report some difficulty swallowing.     10/20/2020 Pathology Results   FINAL MICROSCOPIC DIAGNOSIS:   A. LYMPH NODE, RIGHT NECK, NEEDLE CORE BIOPSY:  - Metastatic squamous cell carcinoma associated with scanty lymphoid tissue.   10/20/2020 Procedure   Technically successful ultrasound guided biopsy of indeterminate right neck mass/nodal conglomeration.   10/22/2020 Procedure   The fiberoptic laryngoscope was then placed through the nasal passage to view the pharynx and larynx. After completion, the telescope was removed. Findings included normal nasal passages and  no mass or abnormality in the nasopharynx. There is a granular mass involving the right lateral pharyngeal wall inferior to tonsillar area and extending to the midline of the vallecula. The larynx appears normal. Pyriform sinuses are open. Secretions are minimal. Vocal folds are without mass, scarring, or ulceration. The vocal folds adduct and abduct symmetrically. There is good glottal closure. Muscle tension patterns are not present. Laryngeal edema is minimal.    11/11/2020 PET scan   1. Hypermetabolic mass in the RIGHT hypopharynx localizing to the RIGHT tonsillar pillar and vallecula. 2. Solitary hypermetabolic metastatic lymph node to the RIGHT level II nodal station. 3. No distant metastatic disease.   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HCC)   11/16/2020 Cancer Staging   Staging form: Pharynx - HPV-Mediated Oropharynx, AJCC 8th Edition - Clinical stage from 11/16/2020: Stage I (cT2, cN1, cM0, p16+) -  Signed by Lonie Peak, MD on 11/16/2020 Stage prefix: Initial diagnosis   12/22/2020 - 02/01/2021 Chemotherapy   Patient is on Treatment Plan : HEAD/NECK Cisplatin q7d         INTERVAL HISTORY:  Connar is here today for repeat clinical assessment.  He reports persistent fatigue.  He denies sore mouth or throat.  He has new dentures that need adjusting, so he plans to contact the prosthodontist.  He requests a refill of Peridex today.  He denies palpable adenopathy.  He denies difficulty swallowing.  He reports increased dry cough over the past 2 months.  He denies fevers or chills. He has chronic right arm and leg pain, which is stable.  He continues to ambulate with a cane.  His appetite has improved and he is regaining weight. His weight has increased 11 pounds over last 3 months .  He saw Dr. Jenne Pane in June and 6 month follow up with him is scheduled.  REVIEW OF SYSTEMS:  Review of Systems  Constitutional:  Positive for fatigue. Negative for appetite change, chills, fever and unexpected weight change.  HENT:   Negative for lump/mass, mouth sores, sore throat and trouble swallowing.   Respiratory:  Negative for cough and shortness of breath.   Cardiovascular:  Negative for chest pain and leg swelling.  Gastrointestinal:  Negative for abdominal pain, constipation, diarrhea, nausea and vomiting.  Genitourinary:  Negative for difficulty urinating, dysuria, frequency and hematuria.   Musculoskeletal:  Positive for arthralgias (Right arm and leg, chronic). Negative for back pain and myalgias.  Skin:  Negative for itching, rash and wound.  Neurological:  Negative for dizziness, extremity weakness, headaches, light-headedness and numbness.  Hematological:  Negative for adenopathy.  Psychiatric/Behavioral:  Negative for depression and sleep disturbance. The patient is not nervous/anxious.      VITALS:  Blood pressure 118/73, pulse 80, temperature 97.8 F (36.6 C), temperature source Oral,  resp. rate 18, height 5\' 8"  (1.727 m), weight 174 lb (78.9 kg), SpO2 100%.  Wt Readings from Last 3 Encounters:  10/31/22 174 lb (78.9 kg)  07/22/22 163 lb 9.6 oz (74.2 kg)  04/21/22 161 lb 6.4 oz (73.2 kg)    Body mass index is 26.46 kg/m.  Performance status (ECOG): 1 - Symptomatic but completely ambulatory  PHYSICAL EXAM:  Physical Exam Vitals and nursing note reviewed.  Constitutional:      General: He is not in acute distress.    Appearance: Normal appearance. He is normal weight.  HENT:     Head: Normocephalic and atraumatic.     Mouth/Throat:     Lips: No  lesions.     Mouth: Mucous membranes are moist. No oral lesions.     Dentition: Has dentures (Upper and lower).     Tongue: No lesions. Tongue does not deviate from midline.     Palate: No mass and lesions.     Pharynx: Oropharynx is clear. No oropharyngeal exudate or posterior oropharyngeal erythema.  Eyes:     General: No scleral icterus.    Extraocular Movements: Extraocular movements intact.     Conjunctiva/sclera: Conjunctivae normal.     Pupils: Pupils are equal, round, and reactive to light.  Cardiovascular:     Rate and Rhythm: Normal rate and regular rhythm.     Heart sounds: Normal heart sounds. No murmur heard.    No friction rub. No gallop.  Pulmonary:     Effort: Pulmonary effort is normal.     Breath sounds: Normal breath sounds. No wheezing, rhonchi or rales.  Abdominal:     General: Bowel sounds are normal. There is no distension.     Palpations: Abdomen is soft. There is no hepatomegaly, splenomegaly or mass.     Tenderness: There is no abdominal tenderness.  Musculoskeletal:        General: Normal range of motion.     Cervical back: Normal range of motion and neck supple. No tenderness.     Right lower leg: No edema.     Left lower leg: No edema.  Lymphadenopathy:     Cervical: No cervical adenopathy.     Upper Body:     Right upper body: No supraclavicular or axillary adenopathy.     Left  upper body: No supraclavicular or axillary adenopathy.  Skin:    General: Skin is warm and dry.     Coloration: Skin is not jaundiced.     Findings: No rash.  Neurological:     Mental Status: He is alert and oriented to person, place, and time.     Cranial Nerves: No cranial nerve deficit.  Psychiatric:        Mood and Affect: Mood normal.        Behavior: Behavior normal.        Thought Content: Thought content normal.     LABS:      Latest Ref Rng & Units 10/21/2022   12:00 AM 07/22/2022   10:03 AM 04/19/2022   12:00 AM  CBC  WBC  6.5     5.8  6.4      Hemoglobin 13.5 - 17.5 11.5     11.2  10.6      Hematocrit 41 - 53 35     36.2  33      Platelets 150 - 400 K/uL 240     249  239         This result is from an external source.      Latest Ref Rng & Units 10/21/2022   12:00 AM 07/22/2022   10:03 AM 04/19/2022   12:00 AM  CMP  Glucose 70 - 99 mg/dL  657    BUN 4 - 21 13     19  23       Creatinine 0.6 - 1.3 0.7     0.92  0.8      Sodium 137 - 147 140     142  142      Potassium 3.5 - 5.1 mEq/L 3.6     3.8  4.0      Chloride 99 - 108 104  108  107      CO2 13 - 22 26     24  25       Calcium 8.7 - 10.7 9.3     9.3  9.2      Total Protein 6.5 - 8.1 g/dL  7.4    Total Bilirubin 0.3 - 1.2 mg/dL  0.5    Alkaline Phos 25 - 125 64     63  48      AST 14 - 40 25     17  23       ALT 10 - 40 U/L 17     14  16          This result is from an external source.     No results found for: "CEA1", "CEA" / No results found for: "CEA1", "CEA" No results found for: "PSA1" No results found for: "OZH086" No results found for: "CAN125"  No results found for: "TOTALPROTELP", "ALBUMINELP", "A1GS", "A2GS", "BETS", "BETA2SER", "GAMS", "MSPIKE", "SPEI" Lab Results  Component Value Date   TIBC 361 07/22/2022   TIBC 376 11/10/2021   TIBC 353 08/12/2021   FERRITIN 29 07/22/2022   FERRITIN 40 11/10/2021   FERRITIN 90 08/12/2021   IRONPCTSAT 12 (L) 07/22/2022   IRONPCTSAT 14 (L) 11/10/2021    IRONPCTSAT 17 (L) 08/12/2021   No results found for: "LDH"  STUDIES:  No results found.    HISTORY:   Past Medical History:  Diagnosis Date   Allergy    Arthritis    Blood transfusion without reported diagnosis    with bleeding colon per pt in the past    BPH (benign prostatic hypertrophy)    Cancer (HCC)    head/neck   Diabetes mellitus    DX  5 YR AGO  Type II   Glaucoma    H/O: GI bleed    History of kidney stones    History of tobacco abuse 11/01/2022   HLD (hyperlipidemia) 09/28/2017   Myocardial infarction (HCC)    in the 1990's    Past Surgical History:  Procedure Laterality Date   COLONOSCOPY     CYSTOSCOPY WITH RETROGRADE PYELOGRAM, URETEROSCOPY AND STENT PLACEMENT Right 05/20/2016   Procedure: CYSTOSCOPY, URETEROSCOPY, TRANSURETHRAL INCISION OF URETHROCELE, BASKET STONE REMOVAL;  Surgeon: Bjorn Pippin, MD;  Location: [facility];  Service: Urology;  Laterality: Right;   Gall Bladder STones removed     left total knee replacement     MULTIPLE EXTRACTIONS WITH ALVEOLOPLASTY N/A 11/20/2020   Procedure: MULTIPLE EXTRACTION WITH ALVEOLOPLASTY;  Surgeon: Sharman Cheek, DMD;  Location: [facility] OR;  Service: Dentistry;  Laterality: N/A;   TOTAL KNEE ARTHROPLASTY  04/04/2011   Procedure: TOTAL KNEE ARTHROPLASTY;  Surgeon: Raymon Mutton, MD;  Location: [facility] OR;  Service: Orthopedics;  Laterality: Left;    Family History  Problem Relation Age of Onset   Arthritis Mother    Asthma Paternal Grandfather    Diabetes Sister    Colon polyps Brother    Colon cancer Neg Hx    Esophageal cancer Neg Hx    Rectal cancer Neg Hx    Stomach cancer Neg Hx     Social History:  reports that he quit smoking about 16 years ago. His smoking use included cigarettes. He started smoking about 61 years ago. He has a 67.5 pack-year smoking history. He quit smokeless tobacco use about 4 years ago.  His smokeless tobacco use included chew. He reports that he does not drink alcohol and  does not use  drugs.The patient is alone  today.  Allergies: No Known Allergies  Current Medications: Current Outpatient Medications  Medication Sig Dispense Refill   diclofenac (VOLTAREN) 75 MG EC tablet Take 75 mg by mouth 2 (two) times daily as needed.     JARDIANCE 10 MG TABS tablet Take 10 mg by mouth daily.     ACCU-CHEK GUIDE test strip See admin instructions.     Accu-Chek Softclix Lancets lancets See admin instructions.     atorvastatin (LIPITOR) 80 MG tablet Take by mouth.     Blood Glucose Monitoring Suppl (ACCU-CHEK GUIDE ME) w/Device KIT See admin instructions.     brimonidine-timolol (COMBIGAN) 0.2-0.5 % ophthalmic solution SMARTSIG:1 Drop(s) In Eye(s) Every 12 Hours     chlorhexidine (PERIDEX) 0.12 % solution 15 mLs 2 (two) times daily. (Patient not taking: Reported on 10/31/2022)     esomeprazole (NEXIUM) 40 MG capsule Take 40 mg by mouth daily at 12 noon.     ferrous sulfate 325 (65 FE) MG tablet Take 325 mg by mouth daily with breakfast. Informed patient to start taking iron supplement every day. 01/11/21     guaiFENesin-codeine 100-10 MG/5ML syrup Take 5 mLs by mouth every 6 (six) hours as needed for cough. 240 mL 0   HYDROcodone-acetaminophen (NORCO) 10-325 MG tablet Take 1 tablet by mouth every 6 (six) hours as needed.     ketorolac (ACULAR) 0.5 % ophthalmic solution Place into the right eye.     metFORMIN (GLUCOPHAGE) 1000 MG tablet Take 1 tablet (1,000 mg total) by mouth 2 (two) times daily with a meal. 180 tablet 3   ofloxacin (OCUFLOX) 0.3 % ophthalmic solution Place into the right eye.     prednisoLONE acetate (PRED FORTE) 1 % ophthalmic suspension Place into the right eye.     promethazine-codeine (PHENERGAN WITH CODEINE) 6.25-10 MG/5ML syrup TAKE BY MOUTH EVERY 4 HOURS AS NEEDED FOR COUGH     No current facility-administered medications for this visit.

## 2022-11-01 ENCOUNTER — Other Ambulatory Visit: Payer: Self-pay | Admitting: Oncology

## 2022-11-01 ENCOUNTER — Encounter: Payer: Self-pay | Admitting: Hematology and Oncology

## 2022-11-01 ENCOUNTER — Telehealth: Payer: Self-pay | Admitting: Hematology and Oncology

## 2022-11-01 ENCOUNTER — Encounter: Payer: Self-pay | Admitting: Oncology

## 2022-11-01 DIAGNOSIS — R053 Chronic cough: Secondary | ICD-10-CM

## 2022-11-01 DIAGNOSIS — Z87891 Personal history of nicotine dependence: Secondary | ICD-10-CM

## 2022-11-01 DIAGNOSIS — R059 Cough, unspecified: Secondary | ICD-10-CM | POA: Insufficient documentation

## 2022-11-01 HISTORY — DX: Personal history of nicotine dependence: Z87.891

## 2022-11-01 NOTE — Telephone Encounter (Signed)
11/01/22 Spoke with patient and confirmed next appt

## 2022-11-01 NOTE — Assessment & Plan Note (Addendum)
Chronic anemia. S/P IV Feraheme in October 2023. Iron studies were equivocal in June, but soluble transferrin did not get added. B12 was low at 197, but did not get B12 started. Hemoglobin from September 6 stable.  Iron studies and B12 not repeated at the time of his CT scan.  Patient does not wish to have phlebotomy for further laboratory studies today.  He does not wish to receive B12 injections.  I will, therefore, start him on oral B12 tabs 1000 mcg daily.  Will plan to repeat nutritional studies at his next visit.

## 2022-11-01 NOTE — Assessment & Plan Note (Signed)
Patient reports dry cough for about 2 months.  Stat chest x-ray did not reveal any active cardiopulmonary disease.

## 2022-11-01 NOTE — Assessment & Plan Note (Signed)
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  He completed concurrent chemoradiation with weekly cisplatin in December 2022.  His weight has improved.  He remains without clinical evidence of recurrence.  CT neck from September 6 results pending-will contact him when available.  We will plan to see him back in 3 months with a CBC, comprehensive metabolic panel, ferritin, B12 and folate.

## 2022-11-01 NOTE — Assessment & Plan Note (Signed)
The patient reports smoking 1/2 ppd cigarettes for 15 years. He states he quit smoking about 5 years ago. Per his chart he has over a 20 pack year history of smoking.  His last CT chest in February 2016 as a 26-month follow up of a right middle lobe nodule revealed stable probable perifissural lymph node abutting the minor fissure of the right middle lobe measuring only 5 mm. No additional pulmonary nodule seen. Mild changes of centrilobular emphysema. Calcifications in the distribution of the left anterior descending artery. Single gallstone within the gallbladder. Probable small nonobstructing left renal calculus.  The patient is on atorvastatin for dyslipidemia.  Further CT chest had not been done.  PET/CT in September 2022 did not reveal any concerning nodules or hypermetabolic activity in the lung.  Randomized trial evidence supports screening up to age 74, but there is uncertainty about the upper age limit to continue screening for lung cancer.  Chest x-ray today is negative.  I do not recommend low-dose CT lung at this time.

## 2022-11-02 DIAGNOSIS — Z79899 Other long term (current) drug therapy: Secondary | ICD-10-CM | POA: Diagnosis not present

## 2022-11-02 DIAGNOSIS — M47816 Spondylosis without myelopathy or radiculopathy, lumbar region: Secondary | ICD-10-CM | POA: Diagnosis not present

## 2022-11-02 DIAGNOSIS — M25561 Pain in right knee: Secondary | ICD-10-CM | POA: Diagnosis not present

## 2022-11-02 DIAGNOSIS — M170 Bilateral primary osteoarthritis of knee: Secondary | ICD-10-CM | POA: Diagnosis not present

## 2022-11-02 DIAGNOSIS — G8929 Other chronic pain: Secondary | ICD-10-CM | POA: Diagnosis not present

## 2022-11-14 ENCOUNTER — Other Ambulatory Visit: Payer: Self-pay | Admitting: Oncology

## 2022-11-14 DIAGNOSIS — R053 Chronic cough: Secondary | ICD-10-CM

## 2022-11-18 ENCOUNTER — Other Ambulatory Visit: Payer: Self-pay | Admitting: Oncology

## 2022-11-18 ENCOUNTER — Other Ambulatory Visit: Payer: Self-pay | Admitting: Hematology and Oncology

## 2022-11-18 DIAGNOSIS — R053 Chronic cough: Secondary | ICD-10-CM

## 2022-11-18 NOTE — Telephone Encounter (Signed)
Requesting refill too soon.

## 2022-11-22 ENCOUNTER — Other Ambulatory Visit: Payer: Self-pay | Admitting: Oncology

## 2022-11-22 ENCOUNTER — Ambulatory Visit: Payer: 59 | Admitting: Podiatry

## 2022-11-22 DIAGNOSIS — R053 Chronic cough: Secondary | ICD-10-CM

## 2022-11-23 ENCOUNTER — Other Ambulatory Visit: Payer: Self-pay | Admitting: Oncology

## 2022-11-23 DIAGNOSIS — N200 Calculus of kidney: Secondary | ICD-10-CM | POA: Diagnosis not present

## 2022-11-23 DIAGNOSIS — R053 Chronic cough: Secondary | ICD-10-CM

## 2022-11-23 DIAGNOSIS — N138 Other obstructive and reflux uropathy: Secondary | ICD-10-CM | POA: Diagnosis not present

## 2022-11-25 ENCOUNTER — Encounter: Payer: Self-pay | Admitting: Oncology

## 2022-11-28 ENCOUNTER — Other Ambulatory Visit: Payer: Self-pay | Admitting: Oncology

## 2022-11-28 DIAGNOSIS — R053 Chronic cough: Secondary | ICD-10-CM

## 2022-11-29 ENCOUNTER — Encounter: Payer: Self-pay | Admitting: Hematology and Oncology

## 2022-11-30 ENCOUNTER — Encounter: Payer: Self-pay | Admitting: Hematology and Oncology

## 2022-12-02 ENCOUNTER — Other Ambulatory Visit: Payer: Self-pay | Admitting: Oncology

## 2022-12-02 DIAGNOSIS — M47816 Spondylosis without myelopathy or radiculopathy, lumbar region: Secondary | ICD-10-CM | POA: Diagnosis not present

## 2022-12-02 DIAGNOSIS — E119 Type 2 diabetes mellitus without complications: Secondary | ICD-10-CM | POA: Diagnosis not present

## 2022-12-02 DIAGNOSIS — J309 Allergic rhinitis, unspecified: Secondary | ICD-10-CM | POA: Diagnosis not present

## 2022-12-02 DIAGNOSIS — R053 Chronic cough: Secondary | ICD-10-CM

## 2022-12-02 DIAGNOSIS — Z79899 Other long term (current) drug therapy: Secondary | ICD-10-CM | POA: Diagnosis not present

## 2022-12-02 DIAGNOSIS — C109 Malignant neoplasm of oropharynx, unspecified: Secondary | ICD-10-CM | POA: Diagnosis not present

## 2022-12-02 DIAGNOSIS — M170 Bilateral primary osteoarthritis of knee: Secondary | ICD-10-CM | POA: Diagnosis not present

## 2022-12-05 ENCOUNTER — Encounter: Payer: Self-pay | Admitting: Podiatry

## 2022-12-05 ENCOUNTER — Ambulatory Visit (INDEPENDENT_AMBULATORY_CARE_PROVIDER_SITE_OTHER): Payer: 59 | Admitting: Podiatry

## 2022-12-05 DIAGNOSIS — M79676 Pain in unspecified toe(s): Secondary | ICD-10-CM | POA: Diagnosis not present

## 2022-12-05 DIAGNOSIS — E119 Type 2 diabetes mellitus without complications: Secondary | ICD-10-CM

## 2022-12-05 DIAGNOSIS — B351 Tinea unguium: Secondary | ICD-10-CM

## 2022-12-05 NOTE — Progress Notes (Signed)
Subjective:  Patient ID: David Kelly, male    DOB: 1945-10-05,  MRN: [id number]  No chief complaint on file.   77 y.o. male presents with the above complaint. History confirmed with patient. Patient presenting with pain related to dystrophic thickened elongated nails. Patient is unable to trim own nails related to nail dystrophy and/or mobility issues. Patient does does have a history of T2DM.   Objective:  Physical Exam: warm, good capillary refill nail exam onychomycosis of the toenails, onycholysis, and dystrophic nails DP pulses palpable, PT pulses palpable, and protective sensation absent Left Foot:  Pain with palpation of nails due to elongation and dystrophic growth.  Right Foot: Pain with palpation of nails due to elongation and dystrophic growth.   Assessment:   1. Pain due to onychomycosis of toenail   2. Diabetes mellitus without complication (HCC)       Plan:  Patient was evaluated and treated and all questions answered.  #Onychomycosis with pain  -Nails palliatively debrided as below. -Educated on self-care  Procedure: Nail Debridement Rationale: Pain Type of Debridement: manual, sharp debridement. Instrumentation: Nail nipper, rotary burr. Number of Nails: 10  Return in about 3 months (around 03/07/2023) for [facility].         Corinna Gab, DPM [facility]

## 2022-12-21 ENCOUNTER — Other Ambulatory Visit: Payer: Self-pay | Admitting: Oncology

## 2022-12-21 DIAGNOSIS — R053 Chronic cough: Secondary | ICD-10-CM

## 2022-12-23 ENCOUNTER — Other Ambulatory Visit: Payer: Self-pay | Admitting: Oncology

## 2022-12-23 DIAGNOSIS — R053 Chronic cough: Secondary | ICD-10-CM

## 2022-12-27 ENCOUNTER — Other Ambulatory Visit: Payer: Self-pay | Admitting: Oncology

## 2022-12-27 DIAGNOSIS — R053 Chronic cough: Secondary | ICD-10-CM

## 2022-12-28 ENCOUNTER — Encounter: Payer: Self-pay | Admitting: Oncology

## 2022-12-29 ENCOUNTER — Telehealth: Payer: Self-pay

## 2022-12-29 NOTE — Telephone Encounter (Addendum)
Message left stated, This is David Kelly, in home nurse for David Kelly.  [name] needs his cough syrup refilled immediately. His cough is bad  01/03/2023  Dr Gilman Buttner sent in cough syrup refill.

## 2023-01-02 ENCOUNTER — Other Ambulatory Visit: Payer: Self-pay | Admitting: Oncology

## 2023-01-02 DIAGNOSIS — Z79899 Other long term (current) drug therapy: Secondary | ICD-10-CM | POA: Diagnosis not present

## 2023-01-02 DIAGNOSIS — R053 Chronic cough: Secondary | ICD-10-CM

## 2023-01-02 DIAGNOSIS — C109 Malignant neoplasm of oropharynx, unspecified: Secondary | ICD-10-CM | POA: Diagnosis not present

## 2023-01-02 DIAGNOSIS — M47816 Spondylosis without myelopathy or radiculopathy, lumbar region: Secondary | ICD-10-CM | POA: Diagnosis not present

## 2023-01-02 DIAGNOSIS — M170 Bilateral primary osteoarthritis of knee: Secondary | ICD-10-CM | POA: Diagnosis not present

## 2023-01-04 ENCOUNTER — Encounter: Payer: Self-pay | Admitting: Oncology

## 2023-01-19 DIAGNOSIS — Z7982 Long term (current) use of aspirin: Secondary | ICD-10-CM | POA: Diagnosis not present

## 2023-01-19 DIAGNOSIS — N200 Calculus of kidney: Secondary | ICD-10-CM | POA: Diagnosis not present

## 2023-01-19 DIAGNOSIS — Z79899 Other long term (current) drug therapy: Secondary | ICD-10-CM | POA: Diagnosis not present

## 2023-01-25 ENCOUNTER — Other Ambulatory Visit: Payer: Self-pay | Admitting: Oncology

## 2023-01-25 DIAGNOSIS — R053 Chronic cough: Secondary | ICD-10-CM

## 2023-01-27 DIAGNOSIS — N200 Calculus of kidney: Secondary | ICD-10-CM | POA: Diagnosis not present

## 2023-01-27 DIAGNOSIS — N138 Other obstructive and reflux uropathy: Secondary | ICD-10-CM | POA: Diagnosis not present

## 2023-02-01 ENCOUNTER — Inpatient Hospital Stay: Payer: 59

## 2023-02-01 ENCOUNTER — Inpatient Hospital Stay: Payer: 59 | Attending: Hematology and Oncology | Admitting: Oncology

## 2023-02-01 ENCOUNTER — Encounter: Payer: Self-pay | Admitting: Dietician

## 2023-02-01 ENCOUNTER — Encounter: Payer: Self-pay | Admitting: Oncology

## 2023-02-01 ENCOUNTER — Telehealth: Payer: Self-pay | Admitting: Oncology

## 2023-02-01 VITALS — BP 112/63 | HR 81 | Temp 98.0°F | Resp 18 | Ht 68.0 in | Wt 187.5 lb

## 2023-02-01 DIAGNOSIS — Z7902 Long term (current) use of antithrombotics/antiplatelets: Secondary | ICD-10-CM | POA: Diagnosis not present

## 2023-02-01 DIAGNOSIS — D539 Nutritional anemia, unspecified: Secondary | ICD-10-CM

## 2023-02-01 DIAGNOSIS — Z85818 Personal history of malignant neoplasm of other sites of lip, oral cavity, and pharynx: Secondary | ICD-10-CM | POA: Diagnosis not present

## 2023-02-01 DIAGNOSIS — I252 Old myocardial infarction: Secondary | ICD-10-CM | POA: Diagnosis not present

## 2023-02-01 DIAGNOSIS — Z87891 Personal history of nicotine dependence: Secondary | ICD-10-CM | POA: Diagnosis not present

## 2023-02-01 DIAGNOSIS — M47816 Spondylosis without myelopathy or radiculopathy, lumbar region: Secondary | ICD-10-CM | POA: Diagnosis not present

## 2023-02-01 DIAGNOSIS — Z79899 Other long term (current) drug therapy: Secondary | ICD-10-CM | POA: Diagnosis not present

## 2023-02-01 DIAGNOSIS — D519 Vitamin B12 deficiency anemia, unspecified: Secondary | ICD-10-CM | POA: Insufficient documentation

## 2023-02-01 DIAGNOSIS — C109 Malignant neoplasm of oropharynx, unspecified: Secondary | ICD-10-CM | POA: Diagnosis not present

## 2023-02-01 DIAGNOSIS — M170 Bilateral primary osteoarthritis of knee: Secondary | ICD-10-CM | POA: Diagnosis not present

## 2023-02-01 DIAGNOSIS — Z08 Encounter for follow-up examination after completed treatment for malignant neoplasm: Secondary | ICD-10-CM | POA: Insufficient documentation

## 2023-02-01 LAB — CBC WITH DIFFERENTIAL (CANCER CENTER ONLY)
Abs Immature Granulocytes: 0.02 10*3/uL (ref 0.00–0.07)
Basophils Absolute: 0 10*3/uL (ref 0.0–0.1)
Basophils Relative: 0 %
Eosinophils Absolute: 0.1 10*3/uL (ref 0.0–0.5)
Eosinophils Relative: 2 %
HCT: 37.8 % — ABNORMAL LOW (ref 39.0–52.0)
Hemoglobin: 12.1 g/dL — ABNORMAL LOW (ref 13.0–17.0)
Immature Granulocytes: 0 %
Lymphocytes Relative: 13 %
Lymphs Abs: 0.9 10*3/uL (ref 0.7–4.0)
MCH: 27.3 pg (ref 26.0–34.0)
MCHC: 32 g/dL (ref 30.0–36.0)
MCV: 85.1 fL (ref 80.0–100.0)
Monocytes Absolute: 0.4 10*3/uL (ref 0.1–1.0)
Monocytes Relative: 6 %
Neutro Abs: 5.2 10*3/uL (ref 1.7–7.7)
Neutrophils Relative %: 79 %
Platelet Count: 246 10*3/uL (ref 150–400)
RBC: 4.44 MIL/uL (ref 4.22–5.81)
RDW: 14.6 % (ref 11.5–15.5)
WBC Count: 6.5 10*3/uL (ref 4.0–10.5)
nRBC: 0 % (ref 0.0–0.2)
nRBC: 0 /100{WBCs}

## 2023-02-01 LAB — FERRITIN: Ferritin: 37 ng/mL (ref 24–336)

## 2023-02-01 LAB — IRON AND TIBC
Iron: 91 ug/dL (ref 45–182)
Saturation Ratios: 23 % (ref 17.9–39.5)
TIBC: 392 ug/dL (ref 250–450)
UIBC: 301 ug/dL

## 2023-02-01 LAB — CMP (CANCER CENTER ONLY)
ALT: 8 U/L (ref 0–44)
AST: 16 U/L (ref 15–41)
Albumin: 4.3 g/dL (ref 3.5–5.0)
Alkaline Phosphatase: 84 U/L (ref 38–126)
Anion gap: 13 (ref 5–15)
BUN: 12 mg/dL (ref 8–23)
CO2: 24 mmol/L (ref 22–32)
Calcium: 9.8 mg/dL (ref 8.9–10.3)
Chloride: 105 mmol/L (ref 98–111)
Creatinine: 0.85 mg/dL (ref 0.61–1.24)
GFR, Estimated: >60 mL/min (ref >60)
Glucose, Bld: 123 mg/dL — ABNORMAL HIGH (ref 70–99)
Potassium: 4.2 mmol/L (ref 3.5–5.1)
Sodium: 142 mmol/L (ref 135–145)
Total Bilirubin: 0.4 mg/dL (ref <1.2)
Total Protein: 7.3 g/dL (ref 6.5–8.1)

## 2023-02-01 LAB — VITAMIN B12: Vitamin B-12: 225 pg/mL (ref 180–914)

## 2023-02-01 LAB — FOLATE: Folate: 8.4 ng/mL (ref >5.9)

## 2023-02-01 NOTE — Progress Notes (Signed)
[facility]  [address] [place],  [place]  [address] [phone]  Clinic Day:  10/31/2022  Referring physician: Abner Greenspan, MD  ASSESSMENT & PLAN:   Assessment & Plan: No problem-specific Assessment & Plan notes found for this encounter. 1. History of tobacco abuse (Primary) -Former smoker. -Patient smoked half a pack of cigarettes for 15 to 20 years. -He had a CT scan in February 2016 has a follow-up for a right middle lobe nodule that revealed stable probable perifissural lymph node abutting the minor fissure of the right middle lobe measuring only 5 mm. No additional pulmonary nodule seen. Mild changes of centrilobular emphysema. Calcifications in the distribution of the left anterior descending artery. Single gallstone within the gallbladder. Probable small nonobstructing left renal calculus.  -PET/CT in September 2022 did not reveal any concerning nodules or hypermetabolic activity in the lung.  Randomized trial evidence supports screening up to age 53, but there is uncertainty about the upper age limit to continue screening for lung cancer.   -She had a chest x-ray on 10/31/22 which was negative. -No additional imaging at this time.  2. Deficiency anemia -Status post IV Feraheme back in October 2023. -Labs from June 2024 were more or less stable. -B12 level was low at 197 but declined B12 injections.  He was instructed to start B12 supplements. -He has not started B12 supplements but does take iron supplements daily. -Repeat nutritional studies today.  Will call with results.  3. 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   -He completed concurrent chemoradiation with weekly cisplatin in December 2022.  -His weight has improved.   -He remains without clinical evidence of recurrence.   -CT neck from 10/21/2022 shows satisfactory posttreatment appearance of the neck  Ni-RADS category 1.  Aortic arthrosclerosis and emphysema.   Disposition- RTC in 3 months with a CBC, comprehensive metabolic panel, ferritin, B12 and folate.   The patient understands the plans discussed today and is in agreement with them.  He knows to contact our office if he develops concerns prior to his next appointment.   I provided 30 minutes of face-to-face time during this encounter and > 50% was spent counseling as documented under my assessment and plan.    Mauro Kaufmann, NP  [facility] [address] [place] [address] Dept: [phone] Dept Fax: [phone]   No orders of the defined types were placed in this encounter.     CHIEF COMPLAINT:  CC: Stage I HPV mediated oropharyngeal cancer  Current Treatment: Surveillance  HISTORY OF PRESENT ILLNESS:  David Kelly is a 77 y.o. 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  He was last seen in clinic by Ike Bene, PA on 10/31/2022.  The patient was able to palpate swelling of the right side of his neck. Dr. Christia Reading, otolaryngology, performed an FNA. Pathology from this procedure revealed rare atypical cells of undermined significance. Repeat FNA was performed in August revealed acute and chronic inflammation, with no malignant cell identified.  Excisional right neck lymph node biopsy was done in September.  Final pathology confirmed metastatic squamous cell carcinoma associated with scanty lymphoid tissue.  Laryngoscopy that revealed a mass involving the right lateral pharyngeal wall inferior to  the tonsillar region and extending to the midline of the vallecular.  PET revealed increased radiotracer uptake in the patient's right hypopharynx, with a 3.2 cm solitary hypermetabolic metastatic lymph node in the right level  2 nodal region. No distant metastatic disease was seen. He was seen in consultation by Dr. Basilio Cairo and Dr. Bertis Ruddy and concurrent chemoradiation with weekly cisplatin was recommended. As the patient lives locally, he transferred his care to our office.  He received concurrent chemoradiation with weekly cisplatin completed in December 2022.    CT neck in February 2023 revealed enhancement previous tumor, but otherwise no evidence of disease.  He saw Dr. Jenne Pane in April 2023 and no evidence of persistent or recurrent disease was seen on direct examination.  CT neck in August and March did not reveal any evidence of recurrence. 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   CT neck in March revealed resolving posttreatment changes without evidence of local recurrence or metastatic disease in the neck with mild central lobar emphysema the upper chest.  I do not him to see Dr. Jenne Pane again in June at his last visit.  CT scan from 10/21/2022 revealed satisfactory posttreatment appearance of the neck. NI-Rads category 1.  Mild arthrosclerosis and emphysema.   Oncology History Overview Note  Immunohistochemistry for p16 is strongly positive.    Oropharyngeal cancer (HCC)  09/10/2020 Initial Diagnosis   He presented with a palpable knot in his upper right neck to Dr. Jenne Pane on 09/10/20. Patient denied any pain at that time, though did report some difficulty swallowing.     10/20/2020 Pathology Results   FINAL MICROSCOPIC DIAGNOSIS:   A. LYMPH NODE, RIGHT NECK, NEEDLE CORE BIOPSY:  - Metastatic squamous cell carcinoma associated with scanty lymphoid tissue.   10/20/2020 Procedure   Technically successful ultrasound guided biopsy of indeterminate right neck mass/nodal conglomeration.   10/22/2020 Procedure   The fiberoptic laryngoscope was then placed through the nasal passage to view the pharynx and larynx. After  completion, the telescope was removed. Findings included normal nasal passages and no mass or abnormality in the nasopharynx. There is a granular mass involving the right lateral pharyngeal wall inferior to tonsillar area and extending to the midline of the vallecula. The larynx appears normal. Pyriform sinuses are open. Secretions are minimal. Vocal folds are without mass, scarring, or ulceration. The vocal folds adduct and abduct symmetrically. There is good glottal closure. Muscle tension patterns are not present. Laryngeal edema is minimal.    11/11/2020 PET scan   1. Hypermetabolic mass in the RIGHT hypopharynx localizing to the RIGHT tonsillar pillar and vallecula. 2. Solitary hypermetabolic metastatic lymph node to the RIGHT level II nodal station. 3. No distant metastatic disease.   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HCC)   11/16/2020 Cancer Staging   Staging form: Pharynx - HPV-Mediated Oropharynx, AJCC 8th Edition - Clinical stage from 11/16/2020: Stage I (cT2, cN1, cM0, p16+) - Signed by Lonie Peak, MD on 11/16/2020 Stage prefix: Initial diagnosis   12/22/2020 - 02/01/2021 Chemotherapy   Patient is on Treatment Plan : HEAD/NECK Cisplatin q7d         INTERVAL HISTORY:  David Kelly is here today for repeat clinical assessment.  He denies any interval hospitalizations or surgeries since he was seen last.  Reports he continues to take iron tablets daily.  He never started on B12 supplements.  Reports he still has a cough each day that has not worsened but is not going away.  He is using a cough medicine when it starts to bother him.   Reports he had a lithotripsy completed last week by Dr. Sherryl Barters for 8 mm RUP stone.  He has a stable 8 mm LLP stone and the plan is for an additional lithotripsy in January.  He continues to have mild nocturia, weak stream and increased urinary frequency.  Appetite is fair.  His weight is up 13 pounds since his last visit.   Reports he eats most of his meals out.  No new concerns today.   REVIEW OF SYSTEMS:  Review of Systems  Constitutional:  Positive for fatigue.  Respiratory:  Positive for cough. Negative for shortness of breath.   Genitourinary:  Positive for difficulty urinating and nocturia. Negative for bladder incontinence, hematuria, pelvic pain and penile discharge.      VITALS:  There were no vitals taken for this visit.  Wt Readings from Last 3 Encounters:  10/31/22 174 lb (78.9 kg)  07/22/22 163 lb 9.6 oz (74.2 kg)  04/21/22 161 lb 6.4 oz (73.2 kg)    There is no height or weight on file to calculate BMI.  Performance status (ECOG): 1 - Symptomatic but completely ambulatory  PHYSICAL EXAM:  Physical Exam Constitutional:      Appearance: He is well-developed.  HENT:     Head: Normocephalic and atraumatic.  Eyes:     Pupils: Pupils are equal, round, and reactive to light.  Cardiovascular:     Rate and Rhythm: Normal rate and regular rhythm.     Heart sounds: No murmur heard. Pulmonary:     Effort: Pulmonary effort is normal.     Breath sounds: Normal breath sounds. No wheezing.  Abdominal:     General: Bowel sounds are normal. There is no distension.     Palpations: Abdomen is soft. There is no mass.     Tenderness: There is no abdominal tenderness.  Musculoskeletal:        General: Normal range of motion.     Cervical back: Normal range of motion.  Skin:    General: Skin is warm and dry.  Neurological:     Mental Status: He is alert and oriented to person, place, and time.  Psychiatric:        Behavior: Behavior normal.     LABS:      Latest Ref Rng & Units 10/21/2022   12:00 AM 07/22/2022   10:03 AM 04/19/2022   12:00 AM  CBC  WBC  6.5     5.8  6.4      Hemoglobin 13.5 - 17.5 11.5     11.2  10.6      Hematocrit 41 - 53 35     36.2  33      Platelets 150 - 400 K/uL 240     249  239         This result is from an external source.      Latest Ref Rng & Units 10/21/2022    12:00 AM 07/22/2022   10:03 AM 04/19/2022   12:00 AM  CMP  Glucose 70 - 99 mg/dL  034    BUN 4 - 21 13     19  23       Creatinine 0.6 - 1.3 0.7     0.92  0.8      Sodium 137 - 147 140  142  142      Potassium 3.5 - 5.1 mEq/L 3.6     3.8  4.0      Chloride 99 - 108 104     108  107      CO2 13 - 22 26     24  25       Calcium 8.7 - 10.7 9.3     9.3  9.2      Total Protein 6.5 - 8.1 g/dL  7.4    Total Bilirubin 0.3 - 1.2 mg/dL  0.5    Alkaline Phos 25 - 125 64     63  48      AST 14 - 40 25     17  23       ALT 10 - 40 U/L 17     14  16          This result is from an external source.     No results found for: "CEA1", "CEA" / No results found for: "CEA1", "CEA" No results found for: "PSA1" No results found for: "NGE952" No results found for: "CAN125"  No results found for: "TOTALPROTELP", "ALBUMINELP", "A1GS", "A2GS", "BETS", "BETA2SER", "GAMS", "MSPIKE", "SPEI" Lab Results  Component Value Date   TIBC 361 07/22/2022   TIBC 376 11/10/2021   TIBC 353 08/12/2021   FERRITIN 29 07/22/2022   FERRITIN 40 11/10/2021   FERRITIN 90 08/12/2021   IRONPCTSAT 12 (L) 07/22/2022   IRONPCTSAT 14 (L) 11/10/2021   IRONPCTSAT 17 (L) 08/12/2021   No results found for: "LDH"  STUDIES:  No results found.    HISTORY:   Past Medical History:  Diagnosis Date   Allergy    Arthritis    Blood transfusion without reported diagnosis    with bleeding colon per pt in the past    BPH (benign prostatic hypertrophy)    Cancer (HCC)    head/neck   Diabetes mellitus    DX  5 YR AGO  Type II   Glaucoma    H/O: GI bleed    History of kidney stones    History of tobacco abuse 11/01/2022   HLD (hyperlipidemia) 09/28/2017   Myocardial infarction (HCC)    in the 1990's    Past Surgical History:  Procedure Laterality Date   COLONOSCOPY     CYSTOSCOPY WITH RETROGRADE PYELOGRAM, URETEROSCOPY AND STENT PLACEMENT Right 05/20/2016   Procedure: CYSTOSCOPY, URETEROSCOPY, TRANSURETHRAL INCISION OF  URETHROCELE, BASKET STONE REMOVAL;  Surgeon: Bjorn Pippin, MD;  Location: [facility];  Service: Urology;  Laterality: Right;   Gall Bladder STones removed     left total knee replacement     MULTIPLE EXTRACTIONS WITH ALVEOLOPLASTY N/A 11/20/2020   Procedure: MULTIPLE EXTRACTION WITH ALVEOLOPLASTY;  Surgeon: Sharman Cheek, DMD;  Location: [facility] OR;  Service: Dentistry;  Laterality: N/A;   TOTAL KNEE ARTHROPLASTY  04/04/2011   Procedure: TOTAL KNEE ARTHROPLASTY;  Surgeon: Raymon Mutton, MD;  Location: [facility] OR;  Service: Orthopedics;  Laterality: Left;    Family History  Problem Relation Age of Onset   Arthritis Mother    Asthma Paternal Grandfather    Diabetes Sister    Colon polyps Brother    Colon cancer Neg Hx    Esophageal cancer Neg Hx    Rectal cancer Neg Hx    Stomach cancer Neg Hx     Social History:  reports that he quit smoking about 16 years ago. His smoking use included cigarettes. He started smoking about  61 years ago. He has a 67.5 pack-year smoking history. He quit smokeless tobacco use about 4 years ago.  His smokeless tobacco use included chew. He reports that he does not drink alcohol and does not use drugs.The patient is alone  today.  Allergies: No Known Allergies  Current Medications: Current Outpatient Medications  Medication Sig Dispense Refill   ACCU-CHEK GUIDE test strip See admin instructions.     Accu-Chek Softclix Lancets lancets See admin instructions.     atorvastatin (LIPITOR) 80 MG tablet Take by mouth.     Blood Glucose Monitoring Suppl (ACCU-CHEK GUIDE ME) w/Device KIT See admin instructions.     brimonidine-timolol (COMBIGAN) 0.2-0.5 % ophthalmic solution SMARTSIG:1 Drop(s) In Eye(s) Every 12 Hours     chlorhexidine (PERIDEX) 0.12 % solution 15 mLs 2 (two) times daily. (Patient not taking: Reported on 10/31/2022)     diclofenac (VOLTAREN) 75 MG EC tablet Take 75 mg by mouth 2 (two) times daily as needed.     esomeprazole (NEXIUM) 40 MG capsule Take 40 mg by  mouth daily at 12 noon.     ferrous sulfate 325 (65 FE) MG tablet Take 325 mg by mouth daily with breakfast. Informed patient to start taking iron supplement every day. 01/11/21     guaiFENesin-codeine 100-10 MG/5ML syrup TAKE 5 MILLILITERS BY MOUTH EVERY 6 HOURS AS NEEDED FOR COUGH 240 mL 0   JARDIANCE 10 MG TABS tablet Take 10 mg by mouth daily.     ketorolac (ACULAR) 0.5 % ophthalmic solution Place into the right eye.     metFORMIN (GLUCOPHAGE) 1000 MG tablet Take 1 tablet (1,000 mg total) by mouth 2 (two) times daily with a meal. 180 tablet 3   ofloxacin (OCUFLOX) 0.3 % ophthalmic solution Place into the right eye.     prednisoLONE acetate (PRED FORTE) 1 % ophthalmic suspension Place into the right eye.     No current facility-administered medications for this visit.

## 2023-02-01 NOTE — Telephone Encounter (Signed)
02/01/23 next appt scheduled and confirmed with patient

## 2023-02-10 ENCOUNTER — Other Ambulatory Visit: Payer: Self-pay | Admitting: Oncology

## 2023-02-10 DIAGNOSIS — R053 Chronic cough: Secondary | ICD-10-CM

## 2023-02-14 ENCOUNTER — Other Ambulatory Visit: Payer: Self-pay | Admitting: Oncology

## 2023-02-14 DIAGNOSIS — R053 Chronic cough: Secondary | ICD-10-CM

## 2023-02-16 ENCOUNTER — Other Ambulatory Visit: Payer: Self-pay | Admitting: Oncology

## 2023-02-16 DIAGNOSIS — R053 Chronic cough: Secondary | ICD-10-CM

## 2023-02-20 ENCOUNTER — Other Ambulatory Visit: Payer: Self-pay | Admitting: Oncology

## 2023-02-20 DIAGNOSIS — R053 Chronic cough: Secondary | ICD-10-CM

## 2023-02-23 DIAGNOSIS — Z7982 Long term (current) use of aspirin: Secondary | ICD-10-CM | POA: Diagnosis not present

## 2023-02-23 DIAGNOSIS — Z79899 Other long term (current) drug therapy: Secondary | ICD-10-CM | POA: Diagnosis not present

## 2023-02-23 DIAGNOSIS — I252 Old myocardial infarction: Secondary | ICD-10-CM | POA: Diagnosis not present

## 2023-02-23 DIAGNOSIS — N200 Calculus of kidney: Secondary | ICD-10-CM | POA: Diagnosis not present

## 2023-02-23 DIAGNOSIS — Z87891 Personal history of nicotine dependence: Secondary | ICD-10-CM | POA: Diagnosis not present

## 2023-02-23 DIAGNOSIS — Z7984 Long term (current) use of oral hypoglycemic drugs: Secondary | ICD-10-CM | POA: Diagnosis not present

## 2023-02-23 DIAGNOSIS — Z7902 Long term (current) use of antithrombotics/antiplatelets: Secondary | ICD-10-CM | POA: Diagnosis not present

## 2023-02-23 DIAGNOSIS — E119 Type 2 diabetes mellitus without complications: Secondary | ICD-10-CM | POA: Diagnosis not present

## 2023-02-26 DIAGNOSIS — Z743 Need for continuous supervision: Secondary | ICD-10-CM | POA: Diagnosis not present

## 2023-02-26 DIAGNOSIS — R319 Hematuria, unspecified: Secondary | ICD-10-CM | POA: Diagnosis not present

## 2023-02-26 DIAGNOSIS — N201 Calculus of ureter: Secondary | ICD-10-CM | POA: Diagnosis not present

## 2023-02-26 DIAGNOSIS — R109 Unspecified abdominal pain: Secondary | ICD-10-CM | POA: Diagnosis not present

## 2023-02-26 DIAGNOSIS — N200 Calculus of kidney: Secondary | ICD-10-CM | POA: Diagnosis not present

## 2023-02-28 ENCOUNTER — Other Ambulatory Visit: Payer: Self-pay | Admitting: Oncology

## 2023-02-28 DIAGNOSIS — R053 Chronic cough: Secondary | ICD-10-CM

## 2023-03-03 DIAGNOSIS — M47816 Spondylosis without myelopathy or radiculopathy, lumbar region: Secondary | ICD-10-CM | POA: Diagnosis not present

## 2023-03-03 DIAGNOSIS — M170 Bilateral primary osteoarthritis of knee: Secondary | ICD-10-CM | POA: Diagnosis not present

## 2023-03-03 DIAGNOSIS — I10 Essential (primary) hypertension: Secondary | ICD-10-CM | POA: Diagnosis not present

## 2023-03-03 DIAGNOSIS — C109 Malignant neoplasm of oropharynx, unspecified: Secondary | ICD-10-CM | POA: Diagnosis not present

## 2023-03-03 DIAGNOSIS — Z79899 Other long term (current) drug therapy: Secondary | ICD-10-CM | POA: Diagnosis not present

## 2023-03-06 ENCOUNTER — Other Ambulatory Visit: Payer: Self-pay | Admitting: Oncology

## 2023-03-06 DIAGNOSIS — R053 Chronic cough: Secondary | ICD-10-CM

## 2023-03-06 NOTE — Telephone Encounter (Signed)
Too soon

## 2023-03-07 ENCOUNTER — Ambulatory Visit (INDEPENDENT_AMBULATORY_CARE_PROVIDER_SITE_OTHER): Payer: 59 | Admitting: Podiatry

## 2023-03-07 DIAGNOSIS — M79676 Pain in unspecified toe(s): Secondary | ICD-10-CM | POA: Diagnosis not present

## 2023-03-07 DIAGNOSIS — B351 Tinea unguium: Secondary | ICD-10-CM

## 2023-03-07 DIAGNOSIS — E119 Type 2 diabetes mellitus without complications: Secondary | ICD-10-CM

## 2023-03-07 NOTE — Progress Notes (Unsigned)
  Subjective:  Patient ID: David Kelly, male    DOB: [birth year],  MRN: [id number]  Chief Complaint  Patient presents with   [facility]    [facility] with no A1c listed in Labs, no anti coags.     78 y.o. male presents with the above complaint. History confirmed with patient. Patient presenting with pain related to dystrophic thickened elongated nails. Patient is unable to trim own nails related to nail dystrophy and/or mobility issues. Patient does does have a history of T2DM.   Objective:  Physical Exam: warm, good capillary refill nail exam onychomycosis of the toenails, onycholysis, and dystrophic nails DP pulses palpable, PT pulses palpable, and protective sensation absent Left Foot:  Pain with palpation of nails due to elongation and dystrophic growth. Some incurvation to left hallux nail medial border. Right Foot: Pain with palpation of nails due to elongation and dystrophic growth.   Assessment:   1. Pain due to onychomycosis of toenail   2. Diabetes mellitus without complication (HCC)       Plan:  Patient was evaluated and treated and all questions answered.  #Onychomycosis with pain  -Nails palliatively debrided as below. -Educated on self-care  Procedure: Nail Debridement Rationale: Pain Type of Debridement: manual, sharp debridement. Instrumentation: Nail nipper, rotary burr. Number of Nails: 10  Return in about 3 months (around 06/05/2023) for Diabetic Foot Care.         Bronwen Betters, DPM [facility]

## 2023-03-09 ENCOUNTER — Encounter: Payer: Self-pay | Admitting: Podiatry

## 2023-03-09 ENCOUNTER — Other Ambulatory Visit: Payer: Self-pay | Admitting: Oncology

## 2023-03-09 DIAGNOSIS — R053 Chronic cough: Secondary | ICD-10-CM

## 2023-03-09 NOTE — Telephone Encounter (Signed)
Too soon

## 2023-03-13 DIAGNOSIS — E785 Hyperlipidemia, unspecified: Secondary | ICD-10-CM | POA: Diagnosis not present

## 2023-03-13 DIAGNOSIS — E1129 Type 2 diabetes mellitus with other diabetic kidney complication: Secondary | ICD-10-CM | POA: Diagnosis not present

## 2023-03-20 ENCOUNTER — Other Ambulatory Visit: Payer: Self-pay | Admitting: Oncology

## 2023-03-20 DIAGNOSIS — R053 Chronic cough: Secondary | ICD-10-CM

## 2023-03-21 DIAGNOSIS — K921 Melena: Secondary | ICD-10-CM | POA: Diagnosis not present

## 2023-03-21 DIAGNOSIS — Z139 Encounter for screening, unspecified: Secondary | ICD-10-CM | POA: Diagnosis not present

## 2023-03-21 DIAGNOSIS — E1129 Type 2 diabetes mellitus with other diabetic kidney complication: Secondary | ICD-10-CM | POA: Diagnosis not present

## 2023-03-21 DIAGNOSIS — Z0000 Encounter for general adult medical examination without abnormal findings: Secondary | ICD-10-CM | POA: Diagnosis not present

## 2023-03-21 DIAGNOSIS — R809 Proteinuria, unspecified: Secondary | ICD-10-CM | POA: Diagnosis not present

## 2023-03-21 DIAGNOSIS — Z136 Encounter for screening for cardiovascular disorders: Secondary | ICD-10-CM | POA: Diagnosis not present

## 2023-03-21 DIAGNOSIS — E785 Hyperlipidemia, unspecified: Secondary | ICD-10-CM | POA: Diagnosis not present

## 2023-03-22 DIAGNOSIS — K921 Melena: Secondary | ICD-10-CM | POA: Diagnosis not present

## 2023-03-23 DIAGNOSIS — N200 Calculus of kidney: Secondary | ICD-10-CM | POA: Diagnosis not present

## 2023-03-23 DIAGNOSIS — N138 Other obstructive and reflux uropathy: Secondary | ICD-10-CM | POA: Diagnosis not present

## 2023-04-03 DIAGNOSIS — M170 Bilateral primary osteoarthritis of knee: Secondary | ICD-10-CM | POA: Diagnosis not present

## 2023-04-03 DIAGNOSIS — C109 Malignant neoplasm of oropharynx, unspecified: Secondary | ICD-10-CM | POA: Diagnosis not present

## 2023-04-03 DIAGNOSIS — Z79899 Other long term (current) drug therapy: Secondary | ICD-10-CM | POA: Diagnosis not present

## 2023-04-03 DIAGNOSIS — M47816 Spondylosis without myelopathy or radiculopathy, lumbar region: Secondary | ICD-10-CM | POA: Diagnosis not present

## 2023-04-04 ENCOUNTER — Encounter: Payer: Self-pay | Admitting: Oncology

## 2023-04-17 ENCOUNTER — Other Ambulatory Visit: Payer: Self-pay | Admitting: Oncology

## 2023-04-17 DIAGNOSIS — R053 Chronic cough: Secondary | ICD-10-CM

## 2023-04-28 NOTE — Progress Notes (Signed)
 [facility]  [address] 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  He completed concurrent chemoradiation with weekly cisplatin in December 2022.  His weight has improved.  He remains without clinical evidence of recurrence.  CT neck from October 21, 2022 shows post-treatment changes.  Deficiency anemia Chronic anemia. S/P IV Feraheme in October 2023. Iron studies were equivocal in June, but soluble transferrin did not get added. B12 was low at 197, but did not get B12 started. Hemoglobin from September 6 stable.  He does not wish to receive B12 injections and so was started on oral B-12 1,000 mcg daily. He also continues iron supplement and his anemia is stable.   Cough Patient reports dry cough. Chest x-ray did not reveal any active cardiopulmonary disease. I have stopped his Robitussin AC   History of tobacco abuse The patient reports smoking 1/2 ppd cigarettes for 15 years. He states he quit smoking about 5 years ago. Per his chart he has over a 20 pack year history of smoking.  His last CT chest in February 2016 as a 29-month follow up of a right middle lobe nodule revealed stable probable perifissural lymph node abutting the minor fissure of the right middle lobe measuring only 5 mm. No additional pulmonary nodule seen. Mild changes of centrilobular emphysema. Single gallstone within the gallbladder. PET/CT in September 2022 did not reveal any concerning nodules or hypermetabolic activity in the lung. Chest x-ray in December was negative.  Plan: He is getting hydrocodone through the pain clinic. I informed him that we will no longer be prescribing the cough medicine with codeine as I really don't think he needs it and he can use over the counter cough medicine. He  has an upcoming appointment with Dr. Jenne Pane, ENT, this month. He has a WBC of 6.2, low but stable hemoglobin of 11.9, and platelet count pf 217,000. His CMP was normal and B-12, folate, ferritin, and iron studies are pending today. I will call him with the results. He continues oral iron and B-12 supplement. I will see him back in 6 months with CBC, CMP, and CT neck. The patient understands the plans discussed today and is in agreement with them.  He knows to contact our office if he develops concerns prior to his next appointment.  I provided 13 minutes of face-to-face time during this encounter and > 50% was spent counseling as documented under my assessment and plan.   [name] MD [name] - A DEPT OF [facility] [address] [place] [address] Dept: [phone] Dept Fax: [phone]   No orders of the defined types were placed in this encounter.   CHIEF COMPLAINT:  CC: Stage I HPV mediated oropharyngeal cancer  Current Treatment: Surveillance  HISTORY OF PRESENT ILLNESS:  David Kelly is a 78 y.o. 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 The patient was able to palpate swelling of the right side of his neck. Dr. Christia Reading, otolaryngology, performed an FNA. Pathology from this procedure revealed rare atypical cells of undermined significance. Repeat FNA was performed in August revealed acute and chronic inflammation, with no malignant cell identified.  Excisional right neck lymph node  biopsy was done in September.  Final pathology confirmed metastatic squamous cell carcinoma associated with scanty lymphoid tissue.  Laryngoscopy that revealed a mass involving the right lateral pharyngeal wall inferior to the tonsillar region and extending to the midline of the vallecular.  PET revealed increased radiotracer uptake in the patient's right hypopharynx, with a  3.2 cm solitary hypermetabolic metastatic lymph node in the right level 2 nodal region. No distant metastatic disease was seen. He was seen in consultation by Dr. Basilio Cairo and Dr. Bertis Ruddy and concurrent chemoradiation with weekly cisplatin was recommended. As the patient lives locally, he transferred his care to our office.  He received concurrent chemoradiation with weekly cisplatin completed in December 2022.    CT neck in February 2023 revealed enhancement previous tumor, but otherwise no evidence of disease.  He saw Dr. Jenne Pane in April 2023 and no evidence of persistent or recurrent disease was seen on direct examination.  CT neck in August and March did not reveal any evidence of recurrence. 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 CT neck in March of 2024 revealed resolving posttreatment changes without evidence of local recurrence or metastatic disease in the neck with mild central lobar emphysema the upper chest.  He also follows up with Dr. Jenne Pane, ENT..   Oncology History Overview Note  Immunohistochemistry for p16 is strongly positive.    Oropharyngeal cancer (HCC)  09/10/2020 Initial Diagnosis   He presented with a palpable knot in his upper right neck to Dr. Jenne Pane on 09/10/20. Patient denied any pain at that time, though did report some difficulty swallowing.     10/20/2020 Pathology Results   FINAL MICROSCOPIC DIAGNOSIS:   A. LYMPH NODE, RIGHT NECK, NEEDLE CORE BIOPSY:  - Metastatic squamous cell carcinoma associated with scanty lymphoid tissue.   10/20/2020 Procedure   Technically successful ultrasound guided biopsy of indeterminate right neck mass/nodal conglomeration.   10/22/2020 Procedure   The fiberoptic laryngoscope was then placed through the nasal passage to view the pharynx and larynx. After completion, the telescope was removed. Findings included normal nasal passages and no mass or  abnormality in the nasopharynx. There is a granular mass involving the right lateral pharyngeal wall inferior to tonsillar area and extending to the midline of the vallecula. The larynx appears normal. Pyriform sinuses are open. Secretions are minimal. Vocal folds are without mass, scarring, or ulceration. The vocal folds adduct and abduct symmetrically. There is good glottal closure. Muscle tension patterns are not present. Laryngeal edema is minimal.    11/11/2020 PET scan   1. Hypermetabolic mass in the RIGHT hypopharynx localizing to the RIGHT tonsillar pillar and vallecula. 2. Solitary hypermetabolic metastatic lymph node to the RIGHT level II nodal station. 3. No distant metastatic disease.   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HCC)   11/16/2020 Cancer Staging   Staging form: Pharynx - HPV-Mediated Oropharynx, AJCC 8th Edition - Clinical stage from 11/16/2020: Stage I (cT2, cN1, cM0, p16+) - Signed by Lonie Peak, MD on 11/16/2020 Stage prefix: Initial diagnosis   12/22/2020 - 02/01/2021 Chemotherapy   Patient is on Treatment Plan : HEAD/NECK Cisplatin q7d      INTERVAL HISTORY:  David Kelly is here today for repeat clinical assessment stage I HPV mediated oropharyngeal cancer. Patient states that he feels ok but complains of right knee pain. He is getting hydrocodone through the pain clinic. I  informed him that we will no longer be prescribing the cough medicine with codeine as I really don't think he needs it and he can use over the counter cough medicine. He has an upcoming appointment with Dr. Jenne Pane, ENT, this month. He has a WBC of 6.2, low but stable hemoglobin of 11.9, and platelet count pf 217,000. His CMP was normal and B-12, folate, ferritin, and iron studies are pending today. I will call him with the results. He continues oral iron and B-12 supplement. I will see him back in 6 months with CBC, CMP, and CT neck. He denies signs of infection such as sore  throat, sinus drainage, or urinary symptoms.  He denies fevers or recurrent chills. He denies nausea, vomiting, chest pain, or dyspnea. His appetite is ok and his weight has increased 4 pounds over last 3 months .   REVIEW OF SYSTEMS:  Review of Systems  Constitutional:  Positive for fatigue. Negative for appetite change, chills, diaphoresis, fever and unexpected weight change.  HENT:  Negative.  Negative for hearing loss, lump/mass, mouth sores, nosebleeds, sore throat, tinnitus, trouble swallowing and voice change.   Eyes: Negative.  Negative for eye problems and icterus.  Respiratory: Negative.  Negative for chest tightness, cough, hemoptysis, shortness of breath and wheezing.   Cardiovascular: Negative.  Negative for chest pain, leg swelling and palpitations.  Gastrointestinal: Negative.  Negative for abdominal distention, abdominal pain, blood in stool, constipation, diarrhea, nausea, rectal pain and vomiting.  Endocrine: Negative.  Negative for hot flashes.  Genitourinary: Negative.  Negative for bladder incontinence, difficulty urinating, dyspareunia, dysuria, frequency, hematuria, nocturia, pelvic pain and penile discharge.   Musculoskeletal:  Positive for arthralgias (Right arm and knee, chronic). Negative for back pain, flank pain, gait problem, myalgias, neck pain and neck stiffness.  Skin: Negative.  Negative for itching, rash and wound.  Neurological: Negative.  Negative for dizziness, extremity weakness, gait problem, headaches, light-headedness, numbness, seizures and speech difficulty.  Hematological: Negative.  Negative for adenopathy. Does not bruise/bleed easily.  Psychiatric/Behavioral: Negative.  Negative for confusion, decreased concentration, depression, sleep disturbance and suicidal ideas. The patient is not nervous/anxious.    VITALS:  Blood pressure (!) 141/74, pulse 73, temperature 97.6 F (36.4 C), temperature source Oral, resp. rate 18, height 5\' 8"  (1.727 m), weight  191 lb 6.4 oz (86.8 kg), SpO2 100%.  Wt Readings from Last 3 Encounters:  05/02/23 191 lb 6.4 oz (86.8 kg)  02/01/23 187 lb 8 oz (85 kg)  10/31/22 174 lb (78.9 kg)    Body mass index is 29.1 kg/m.  Performance status (ECOG): 1 - Symptomatic but completely ambulatory  PHYSICAL EXAM:  Physical Exam Vitals and nursing note reviewed.  Constitutional:      General: He is not in acute distress.    Appearance: Normal appearance. He is normal weight. He is not ill-appearing, toxic-appearing or diaphoretic.  HENT:     Head: Normocephalic and atraumatic.     Right Ear: Tympanic membrane, ear canal and external ear normal. There is no impacted cerumen.     Left Ear: Tympanic membrane, ear canal and external ear normal. There is no impacted cerumen.     Nose: Nose normal. No congestion or rhinorrhea.     Mouth/Throat:     Lips: No lesions.     Mouth: Mucous membranes are moist. No oral lesions.     Dentition: Has dentures (Upper and lower).     Tongue: No lesions. Tongue does not deviate from midline.  Palate: No mass and lesions.     Pharynx: Oropharynx is clear. No oropharyngeal exudate or posterior oropharyngeal erythema.  Eyes:     General: No scleral icterus.       Right eye: No discharge.        Left eye: No discharge.     Extraocular Movements: Extraocular movements intact.     Conjunctiva/sclera: Conjunctivae normal.     Pupils: Pupils are equal, round, and reactive to light.  Neck:     Vascular: No carotid bruit.  Cardiovascular:     Rate and Rhythm: Normal rate and regular rhythm.     Pulses: Normal pulses.     Heart sounds: Normal heart sounds. No murmur heard.    No friction rub. No gallop.  Pulmonary:     Effort: Pulmonary effort is normal. No respiratory distress.     Breath sounds: No stridor. Examination of the left-upper field reveals wheezing. Wheezing present. No rhonchi or rales.     Comments: inspiratory wheezing in the left upper lobe  Chest:     Chest  wall: No tenderness.  Abdominal:     General: Bowel sounds are normal. There is no distension.     Palpations: Abdomen is soft. There is no hepatomegaly, splenomegaly or mass.     Tenderness: There is no abdominal tenderness. There is no right CVA tenderness, left CVA tenderness, guarding or rebound.     Hernia: No hernia is present.  Musculoskeletal:        General: No swelling, tenderness, deformity or signs of injury. Normal range of motion.     Cervical back: Normal range of motion and neck supple. No rigidity or tenderness.     Right lower leg: No edema.     Left lower leg: No edema.  Lymphadenopathy:     Cervical: No cervical adenopathy.     Upper Body:     Right upper body: No supraclavicular or axillary adenopathy.     Left upper body: No supraclavicular or axillary adenopathy.  Skin:    General: Skin is warm and dry.     Coloration: Skin is not jaundiced or pale.     Findings: No bruising, erythema, lesion or rash.  Neurological:     General: No focal deficit present.     Mental Status: He is alert and oriented to person, place, and time. Mental status is at baseline.     Cranial Nerves: No cranial nerve deficit.     Sensory: No sensory deficit.     Motor: No weakness.     Coordination: Coordination normal.     Gait: Gait normal.     Deep Tendon Reflexes: Reflexes normal.  Psychiatric:        Mood and Affect: Mood normal.        Behavior: Behavior normal.        Thought Content: Thought content normal.        Judgment: Judgment normal.    LABS:      Latest Ref Rng & Units 05/02/2023   12:59 PM 02/01/2023   11:47 AM 10/21/2022   12:00 AM  CBC  WBC 4.0 - 10.5 K/uL 6.2  6.5  6.5      Hemoglobin 13.0 - 17.0 g/dL 86.5  78.4  69.6      Hematocrit 39.0 - 52.0 % 38.1  37.8  35      Platelets 150 - 400 K/uL 217  246  240  This result is from an external source.      Latest Ref Rng & Units 05/02/2023   12:59 PM 02/01/2023   11:47 AM 10/21/2022   12:00 AM  CMP   Glucose 70 - 99 mg/dL 829  562    BUN 8 - 23 mg/dL 13  12  13       Creatinine 0.61 - 1.24 mg/dL 1.30  8.65  0.7      Sodium 135 - 145 mmol/L 142  142  140      Potassium 3.5 - 5.1 mmol/L 3.8  4.2  3.6      Chloride 98 - 111 mmol/L 104  105  104      CO2 22 - 32 mmol/L 24  24  26       Calcium 8.9 - 10.3 mg/dL 9.2  9.8  9.3      Total Protein 6.5 - 8.1 g/dL 7.1  7.3    Total Bilirubin 0.0 - 1.2 mg/dL 0.3  0.4    Alkaline Phos 38 - 126 U/L 86  84  64      AST 15 - 41 U/L 22  16  25       ALT 0 - 44 U/L 21  8  17          This result is from an external source.   No results found for: "CEA1", "CEA" / No results found for: "CEA1", "CEA" No results found for: "PSA1" No results found for: "HQI696" No results found for: "CAN125"  No results found for: "TOTALPROTELP", "ALBUMINELP", "A1GS", "A2GS", "BETS", "BETA2SER", "GAMS", "MSPIKE", "SPEI" Lab Results  Component Value Date   TIBC 368 05/02/2023   TIBC 392 02/01/2023   TIBC 361 07/22/2022   FERRITIN 38 05/02/2023   FERRITIN 37 02/01/2023   FERRITIN 29 07/22/2022   IRONPCTSAT 23 05/02/2023   IRONPCTSAT 23 02/01/2023   IRONPCTSAT 12 (L) 07/22/2022   Lab Results  Component Value Date   FOLATE 7.5 05/02/2023   Lab Results  Component Value Date   VITAMINB12 189 05/02/2023   No results found for: "LDH"  STUDIES:  No results found.    HISTORY:   Past Medical History:  Diagnosis Date   Allergy    Arthritis    Blood transfusion without reported diagnosis    with bleeding colon per pt in the past    BPH (benign prostatic hypertrophy)    Cancer (HCC)    head/neck   Diabetes mellitus    DX  5 YR AGO  Type II   Glaucoma    H/O: GI bleed    History of kidney stones    History of tobacco abuse 11/01/2022   HLD (hyperlipidemia) 09/28/2017   Myocardial infarction (HCC)    in the 1990's    Past Surgical History:  Procedure Laterality Date   COLONOSCOPY     CYSTOSCOPY WITH RETROGRADE PYELOGRAM, URETEROSCOPY AND STENT  PLACEMENT Right 05/20/2016   Procedure: CYSTOSCOPY, URETEROSCOPY, TRANSURETHRAL INCISION OF URETHROCELE, BASKET STONE REMOVAL;  Surgeon: Bjorn Pippin, MD;  Location: [facility] ORS;  Service: Urology;  Laterality: Right;   Gall Bladder STones removed     left total knee replacement     MULTIPLE EXTRACTIONS WITH ALVEOLOPLASTY N/A 11/20/2020   Procedure: MULTIPLE EXTRACTION WITH ALVEOLOPLASTY;  Surgeon: Sharman Cheek, DMD;  Location: [facility] OR;  Service: Dentistry;  Laterality: N/A;   TOTAL KNEE ARTHROPLASTY  04/04/2011   Procedure: TOTAL KNEE ARTHROPLASTY;  Surgeon: Raymon Mutton, MD;  Location: [facility]  OR;  Service: Orthopedics;  Laterality: Left;    Family History  Problem Relation Age of Onset   Arthritis Mother    Asthma Paternal Grandfather    Diabetes Sister    Colon polyps Brother    Colon cancer Neg Hx    Esophageal cancer Neg Hx    Rectal cancer Neg Hx    Stomach cancer Neg Hx     Social History:  reports that he quit smoking about 17 years ago. His smoking use included cigarettes. He started smoking about 62 years ago. He has a 67.5 pack-year smoking history. He quit smokeless tobacco use about 4 years ago.  His smokeless tobacco use included chew. He reports that he does not drink alcohol and does not use drugs.The patient is alone  today.  Allergies: No Known Allergies  Current Medications: Current Outpatient Medications  Medication Sig Dispense Refill   ezetimibe (ZETIA) 10 MG tablet Take 10 mg by mouth daily.     HYDROcodone-acetaminophen (NORCO) 10-325 MG tablet Take 1 tablet by mouth every 6 (six) hours as needed.     tamsulosin (FLOMAX) 0.4 MG CAPS capsule      ACCU-CHEK GUIDE test strip See admin instructions.     Accu-Chek Softclix Lancets lancets See admin instructions.     atorvastatin (LIPITOR) 80 MG tablet Take by mouth.     Blood Glucose Monitoring Suppl (ACCU-CHEK GUIDE ME) w/Device KIT See admin instructions.     chlorhexidine (PERIDEX) 0.12 % solution 15 mLs 2 (two) times  daily.     ferrous sulfate 325 (65 FE) MG tablet Take 325 mg by mouth daily with breakfast. Informed patient to start taking iron supplement every day. 01/11/21     guaiFENesin-codeine 100-10 MG/5ML syrup TAKE 5 MILLILITERS BY MOUTH EVERY 6 HOURS AS NEEDED FOR COUGH 240 mL 0   JARDIANCE 10 MG TABS tablet Take 10 mg by mouth daily.     metFORMIN (GLUCOPHAGE) 1000 MG tablet Take 1 tablet (1,000 mg total) by mouth 2 (two) times daily with a meal. 180 tablet 3   No current facility-administered medications for this visit.    I,Jasmine M Lassiter,acting as a scribe for Dellia Beckwith, MD.,have documented all relevant documentation on the behalf of Dellia Beckwith, MD,as directed by  Dellia Beckwith, MD while in the presence of Dellia Beckwith, MD.

## 2023-05-02 ENCOUNTER — Encounter: Payer: Self-pay | Admitting: Oncology

## 2023-05-02 ENCOUNTER — Inpatient Hospital Stay: Payer: 59 | Attending: Hematology and Oncology | Admitting: Oncology

## 2023-05-02 ENCOUNTER — Encounter: Payer: Self-pay | Admitting: Dietician

## 2023-05-02 ENCOUNTER — Other Ambulatory Visit: Payer: Self-pay | Admitting: Oncology

## 2023-05-02 ENCOUNTER — Inpatient Hospital Stay: Payer: 59

## 2023-05-02 VITALS — BP 141/74 | HR 73 | Temp 97.6°F | Resp 18 | Ht 68.0 in | Wt 191.4 lb

## 2023-05-02 DIAGNOSIS — D539 Nutritional anemia, unspecified: Secondary | ICD-10-CM | POA: Insufficient documentation

## 2023-05-02 DIAGNOSIS — Z79899 Other long term (current) drug therapy: Secondary | ICD-10-CM | POA: Insufficient documentation

## 2023-05-02 DIAGNOSIS — M47816 Spondylosis without myelopathy or radiculopathy, lumbar region: Secondary | ICD-10-CM | POA: Diagnosis not present

## 2023-05-02 DIAGNOSIS — Z923 Personal history of irradiation: Secondary | ICD-10-CM | POA: Diagnosis not present

## 2023-05-02 DIAGNOSIS — R059 Cough, unspecified: Secondary | ICD-10-CM | POA: Diagnosis not present

## 2023-05-02 DIAGNOSIS — Z9221 Personal history of antineoplastic chemotherapy: Secondary | ICD-10-CM | POA: Insufficient documentation

## 2023-05-02 DIAGNOSIS — E1165 Type 2 diabetes mellitus with hyperglycemia: Secondary | ICD-10-CM | POA: Diagnosis not present

## 2023-05-02 DIAGNOSIS — C770 Secondary and unspecified malignant neoplasm of lymph nodes of head, face and neck: Secondary | ICD-10-CM

## 2023-05-02 DIAGNOSIS — C109 Malignant neoplasm of oropharynx, unspecified: Secondary | ICD-10-CM | POA: Insufficient documentation

## 2023-05-02 DIAGNOSIS — Z87891 Personal history of nicotine dependence: Secondary | ICD-10-CM | POA: Diagnosis not present

## 2023-05-02 DIAGNOSIS — M170 Bilateral primary osteoarthritis of knee: Secondary | ICD-10-CM | POA: Diagnosis not present

## 2023-05-02 DIAGNOSIS — J309 Allergic rhinitis, unspecified: Secondary | ICD-10-CM | POA: Diagnosis not present

## 2023-05-02 DIAGNOSIS — E538 Deficiency of other specified B group vitamins: Secondary | ICD-10-CM

## 2023-05-02 LAB — CMP (CANCER CENTER ONLY)
ALT: 21 U/L (ref 0–44)
AST: 22 U/L (ref 15–41)
Albumin: 4.4 g/dL (ref 3.5–5.0)
Alkaline Phosphatase: 86 U/L (ref 38–126)
Anion gap: 14 (ref 5–15)
BUN: 13 mg/dL (ref 8–23)
CO2: 24 mmol/L (ref 22–32)
Calcium: 9.2 mg/dL (ref 8.9–10.3)
Chloride: 104 mmol/L (ref 98–111)
Creatinine: 1.08 mg/dL (ref 0.61–1.24)
GFR, Estimated: >60 mL/min (ref >60)
Glucose, Bld: 196 mg/dL — ABNORMAL HIGH (ref 70–99)
Potassium: 3.8 mmol/L (ref 3.5–5.1)
Sodium: 142 mmol/L (ref 135–145)
Total Bilirubin: 0.3 mg/dL (ref 0.0–1.2)
Total Protein: 7.1 g/dL (ref 6.5–8.1)

## 2023-05-02 LAB — FOLATE: Folate: 7.5 ng/mL (ref >5.9)

## 2023-05-02 LAB — CBC WITH DIFFERENTIAL (CANCER CENTER ONLY)
Abs Immature Granulocytes: 0.02 10*3/uL (ref 0.00–0.07)
Basophils Absolute: 0 10*3/uL (ref 0.0–0.1)
Basophils Relative: 1 %
Eosinophils Absolute: 0.1 10*3/uL (ref 0.0–0.5)
Eosinophils Relative: 2 %
HCT: 38.1 % — ABNORMAL LOW (ref 39.0–52.0)
Hemoglobin: 11.9 g/dL — ABNORMAL LOW (ref 13.0–17.0)
Immature Granulocytes: 0 %
Lymphocytes Relative: 16 %
Lymphs Abs: 1 10*3/uL (ref 0.7–4.0)
MCH: 26.9 pg (ref 26.0–34.0)
MCHC: 31.2 g/dL (ref 30.0–36.0)
MCV: 86.2 fL (ref 80.0–100.0)
Monocytes Absolute: 0.4 10*3/uL (ref 0.1–1.0)
Monocytes Relative: 6 %
Neutro Abs: 4.7 10*3/uL (ref 1.7–7.7)
Neutrophils Relative %: 75 %
Platelet Count: 217 10*3/uL (ref 150–400)
RBC: 4.42 MIL/uL (ref 4.22–5.81)
RDW: 14.9 % (ref 11.5–15.5)
WBC Count: 6.2 10*3/uL (ref 4.0–10.5)
nRBC: 0 % (ref 0.0–0.2)
nRBC: 0 /100{WBCs}

## 2023-05-02 LAB — FERRITIN: Ferritin: 38 ng/mL (ref 24–336)

## 2023-05-02 LAB — IRON AND TIBC
Iron: 86 ug/dL (ref 45–182)
Saturation Ratios: 23 % (ref 17.9–39.5)
TIBC: 368 ug/dL (ref 250–450)
UIBC: 282 ug/dL

## 2023-05-02 LAB — VITAMIN B12: Vitamin B-12: 189 pg/mL (ref 180–914)

## 2023-05-09 ENCOUNTER — Encounter: Payer: Self-pay | Admitting: Oncology

## 2023-05-11 DIAGNOSIS — Z85819 Personal history of malignant neoplasm of unspecified site of lip, oral cavity, and pharynx: Secondary | ICD-10-CM | POA: Diagnosis not present

## 2023-05-11 DIAGNOSIS — H903 Sensorineural hearing loss, bilateral: Secondary | ICD-10-CM | POA: Diagnosis not present

## 2023-05-15 ENCOUNTER — Telehealth: Payer: Self-pay

## 2023-05-15 NOTE — Telephone Encounter (Signed)
 Attempted to contact patient. No answer.

## 2023-05-15 NOTE — Telephone Encounter (Signed)
-----   Message from Dellia Beckwith sent at 05/09/2023 12:08 PM EDT ----- Regarding: call Tell him iron and folate levels are okay but B12 is still low. I rec B12 injections, he can discuss with his PCP, pls send coies of his labs to Dr. Abner Greenspan

## 2023-05-16 ENCOUNTER — Encounter: Payer: Self-pay | Admitting: Oncology

## 2023-05-17 ENCOUNTER — Other Ambulatory Visit: Payer: Self-pay | Admitting: Oncology

## 2023-05-17 DIAGNOSIS — R053 Chronic cough: Secondary | ICD-10-CM

## 2023-05-18 ENCOUNTER — Telehealth: Payer: Self-pay

## 2023-05-18 NOTE — Telephone Encounter (Signed)
 Dr Gilman Buttner states we will no longer fill this prescription, pt will need to see PCP.

## 2023-06-01 DIAGNOSIS — M47816 Spondylosis without myelopathy or radiculopathy, lumbar region: Secondary | ICD-10-CM | POA: Diagnosis not present

## 2023-06-01 DIAGNOSIS — Z79899 Other long term (current) drug therapy: Secondary | ICD-10-CM | POA: Diagnosis not present

## 2023-06-01 DIAGNOSIS — C109 Malignant neoplasm of oropharynx, unspecified: Secondary | ICD-10-CM | POA: Diagnosis not present

## 2023-06-01 DIAGNOSIS — M170 Bilateral primary osteoarthritis of knee: Secondary | ICD-10-CM | POA: Diagnosis not present

## 2023-06-06 ENCOUNTER — Ambulatory Visit (INDEPENDENT_AMBULATORY_CARE_PROVIDER_SITE_OTHER)

## 2023-06-06 ENCOUNTER — Encounter: Payer: Self-pay | Admitting: Podiatry

## 2023-06-06 ENCOUNTER — Ambulatory Visit (INDEPENDENT_AMBULATORY_CARE_PROVIDER_SITE_OTHER): Payer: 59 | Admitting: Podiatry

## 2023-06-06 DIAGNOSIS — B351 Tinea unguium: Secondary | ICD-10-CM | POA: Diagnosis not present

## 2023-06-06 DIAGNOSIS — E119 Type 2 diabetes mellitus without complications: Secondary | ICD-10-CM

## 2023-06-06 DIAGNOSIS — M79676 Pain in unspecified toe(s): Secondary | ICD-10-CM

## 2023-06-06 DIAGNOSIS — M7752 Other enthesopathy of left foot: Secondary | ICD-10-CM

## 2023-06-06 MED ORDER — DICLOFENAC SODIUM 1 % EX GEL: 2.0000 g | Freq: Four times a day (QID) | CUTANEOUS | 1 refills | Status: AC

## 2023-06-06 NOTE — Progress Notes (Signed)
  Subjective:  Patient ID: David Kelly, male    DOB: 1945/07/07,  MRN: [id number]  Chief Complaint  Patient presents with   [facility]    [facility] with callous. Last A1c was is unknown. Dr. Arlena Lacrosse, PCP is not on epic. His FBS today was 131, no anti coag.     78 y.o. male presents with the above complaint. History confirmed with patient. Patient presenting with pain related to dystrophic thickened elongated nails. Patient is unable to trim own nails related to nail dystrophy and/or mobility issues. Patient does does have a history of T2DM.   Objective:  Physical Exam: warm, good capillary refill nail exam onychomycosis of the toenails, onycholysis, and dystrophic nails DP pulses palpable, PT pulses palpable, and protective sensation absent Left Foot:  Pain with palpation of nails due to elongation and dystrophic growth. Some incurvation to left hallux nail medial border. Right Foot: Pain with palpation of nails due to elongation and dystrophic growth.   Assessment:   1. Pain due to onychomycosis of toenail   2. Diabetes mellitus without complication (HCC)       Plan:  Patient was evaluated and treated and all questions answered.  #Onychomycosis with pain  -Nails palliatively debrided as below. -Educated on self-care  Procedure: Nail Debridement Rationale: Pain Type of Debridement: manual, sharp debridement. Instrumentation: Nail nipper, rotary burr. Number of Nails: 10  Return in about 3 months (around 09/05/2023) for Diabetic Foot Care.         Eve Hinders, DPM [facility]                 ***Complaining of pain to left first toe dorsal IPJ level, localized puffiness, swelling.  This has been going on for couple weeks at this point gradual onset.  Aching, throbbing, pain with direct pressure.  Tendinitis versus IPJ dorsal aspect.  Capsulitis Voltaren  gel today.  If recalcitrant, consider immobilization in surgical shoe and injection which he is wanting to defer  today.  Discussed RICE therapy

## 2023-06-07 ENCOUNTER — Telehealth: Payer: Self-pay | Admitting: Urology

## 2023-06-07 NOTE — Telephone Encounter (Signed)
 Pts home health nurse called and the rx that Dr. Marvis Sluder sent yesterday to [facility] is now OTC and not a RX, pt stated the medication is too expensive that he can't afford it, he needs something that is an RX. Home health nurse wants to know if there is something else that can be sent in to [facility]. Please Advise. Thanks.

## 2023-06-08 DIAGNOSIS — K625 Hemorrhage of anus and rectum: Secondary | ICD-10-CM | POA: Diagnosis not present

## 2023-06-08 DIAGNOSIS — K2100 Gastro-esophageal reflux disease with esophagitis, without bleeding: Secondary | ICD-10-CM | POA: Diagnosis not present

## 2023-06-08 DIAGNOSIS — K5790 Diverticulosis of intestine, part unspecified, without perforation or abscess without bleeding: Secondary | ICD-10-CM | POA: Diagnosis not present

## 2023-06-08 DIAGNOSIS — K649 Unspecified hemorrhoids: Secondary | ICD-10-CM | POA: Diagnosis not present

## 2023-06-21 DIAGNOSIS — E1129 Type 2 diabetes mellitus with other diabetic kidney complication: Secondary | ICD-10-CM | POA: Diagnosis not present

## 2023-06-21 DIAGNOSIS — E785 Hyperlipidemia, unspecified: Secondary | ICD-10-CM | POA: Diagnosis not present

## 2023-06-25 IMAGING — US US BIOPSY LYMPH NODE
1 series · 13 of 25 positions shown · non-contrast
Comparison: Neck CT-07/13/2020

INDICATION: Concern for head neck cancer, now with large right-sided nodal mass
worrisome for metastatic disease. Please perform ultrasound-guided
biopsy for tissue diagnostic purposes.

EXAM:
ULTRASOUND-GUIDED RIGHT NECK MASS BIOPSY
TECHNIQUE: Informed written consent was obtained from the patient after a
discussion of the risks, benefits and alternatives to treatment.
Questions regarding the procedure were encouraged and answered.
Initial ultrasound scanning of the right-side of the neck
demonstrated an enlarged at least 3.8 x 2.6 cm hypoechoic mass
(image 3) correlating with the nodal conglomeration seen on
preceding neck CT image 38, series 102. An ultrasound image was
saved for documentation purposes. The procedure was planned. A
timeout was performed prior to the initiation of the procedure.

[Series 1: us core biopsy (lymph nodes) · 13 of 37 slices shown]
[im 1/37]
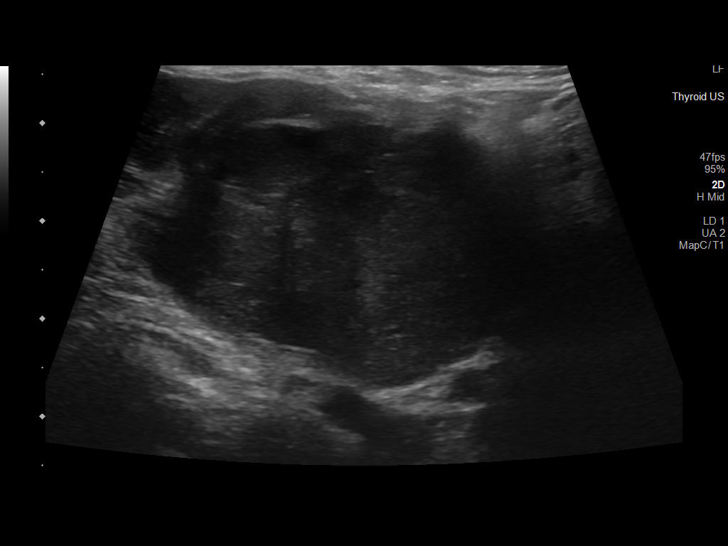
[im 4/37]
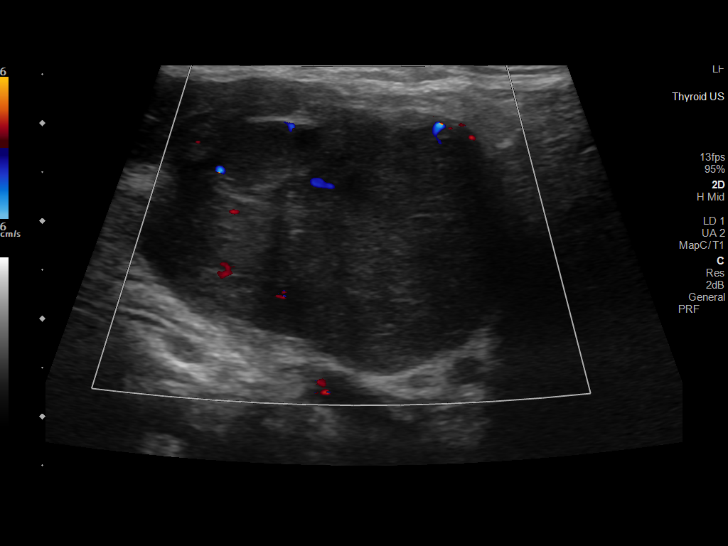
[im 7/37]
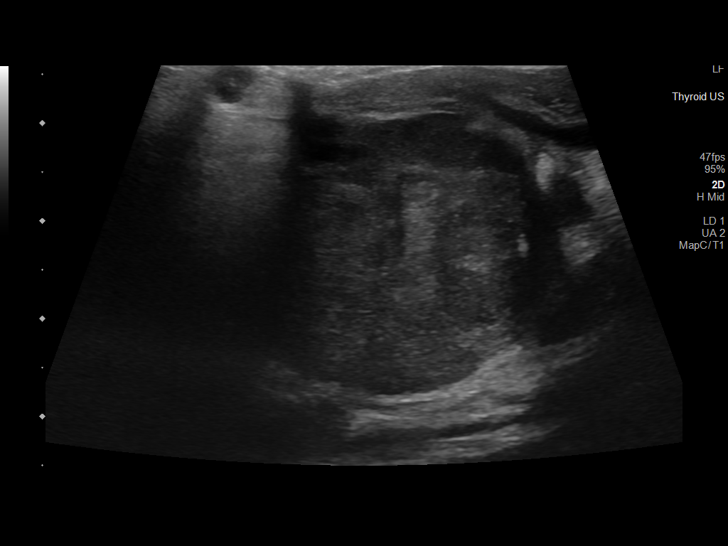
[im 10/37]
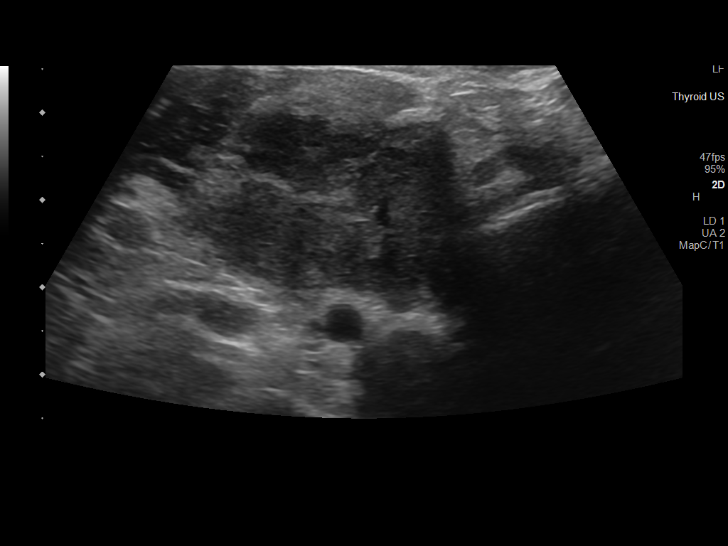
[im 13/37]
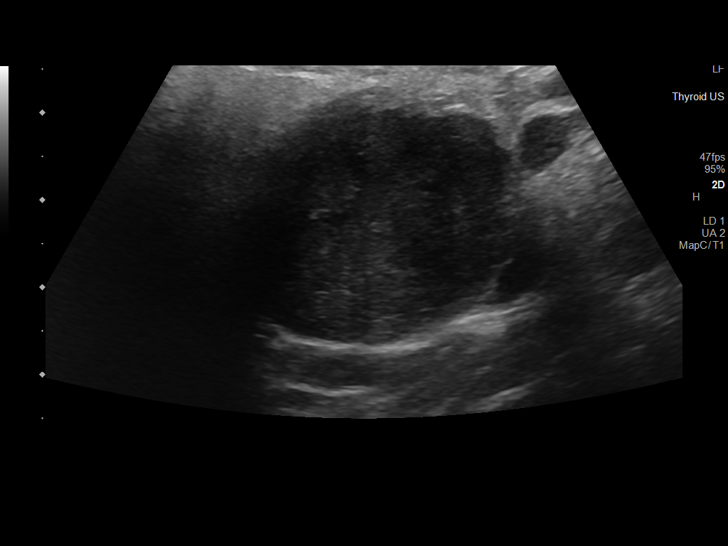
[im 16/37]
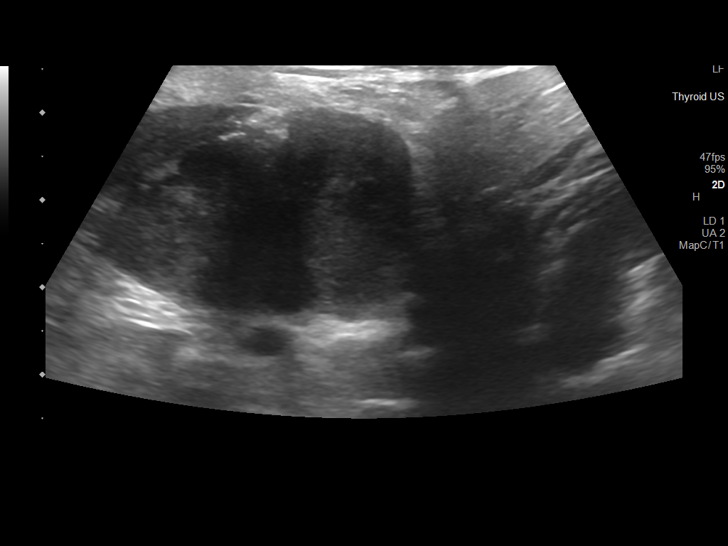
[im 19/37]
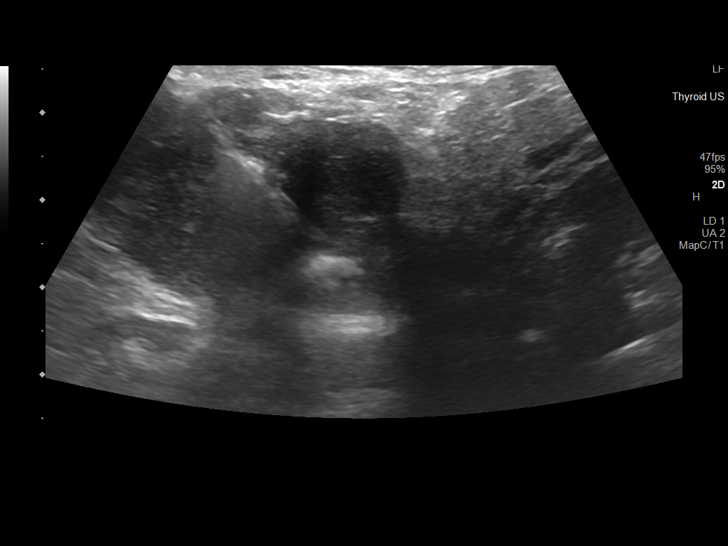
[im 22/37]
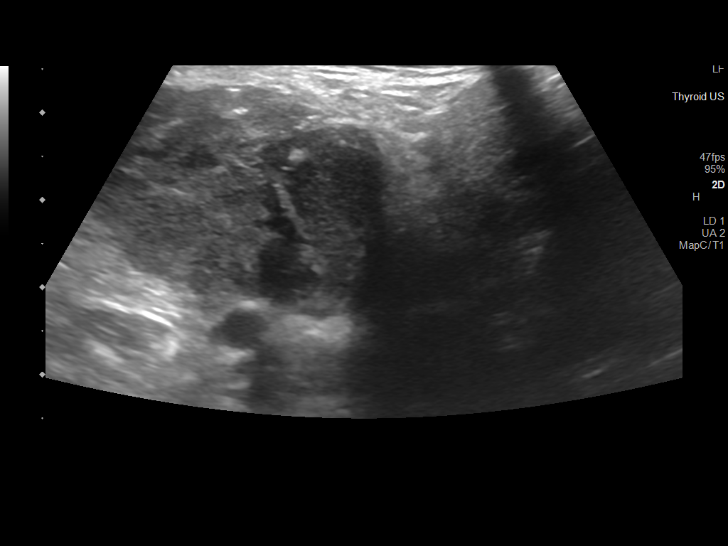
[im 25/37]
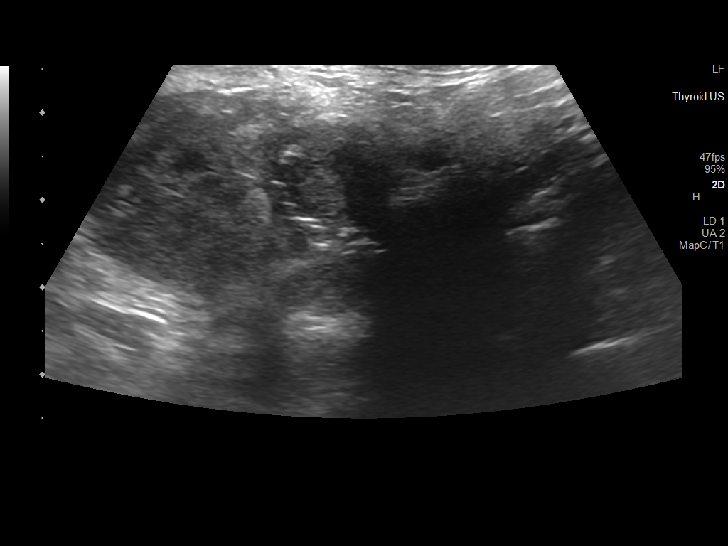
[im 28/37]
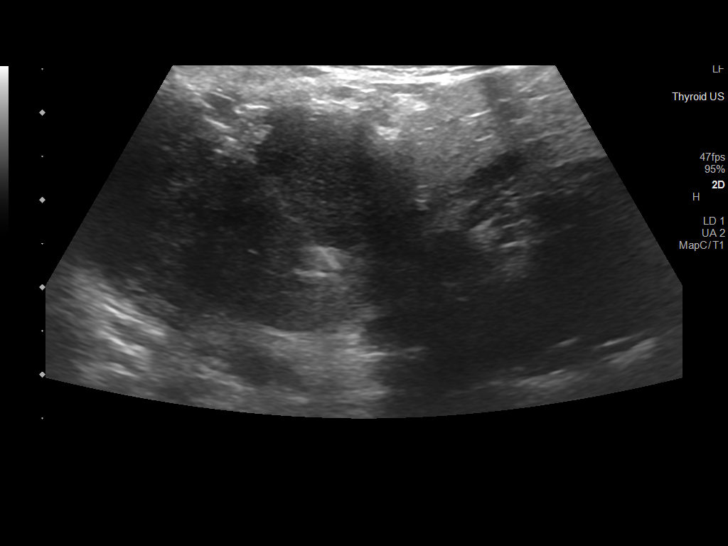
[im 31/37]
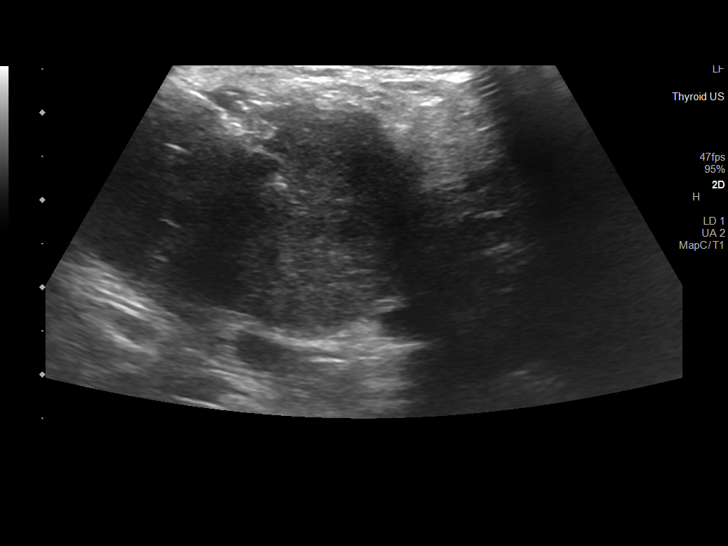
[im 34/37]
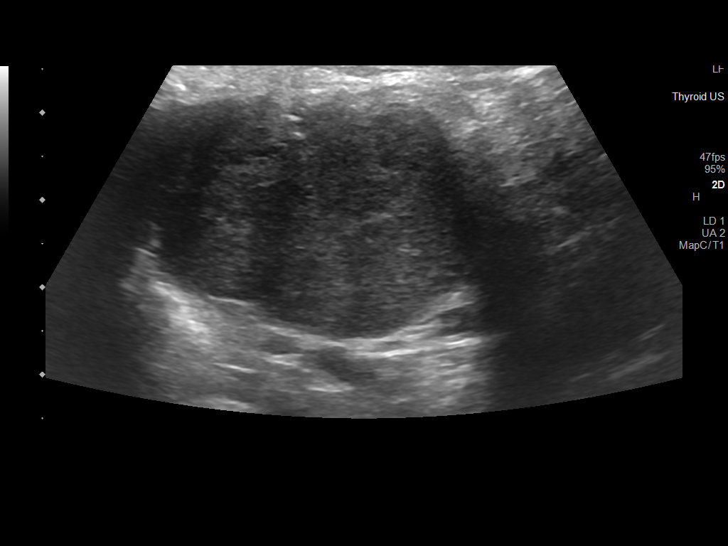
[im 37/37]
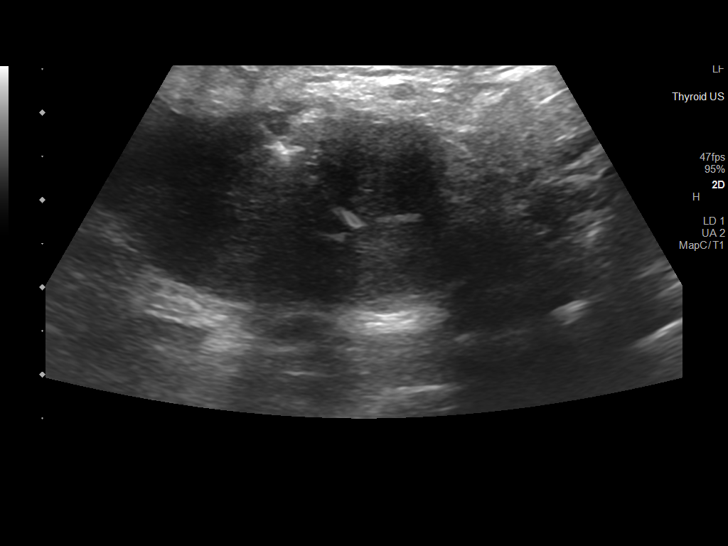

[13 of 25 positions shown; findings below may reference images not displayed]

MEDICATIONS:
None

ANESTHESIA/SEDATION:
Moderate (conscious) sedation was employed during this procedure. A
total of Versed 1 mg and Fentanyl 50 mcg was administered
intravenously.

Moderate Sedation Time: 14 minutes. The patient's level of
consciousness and vital signs were monitored continuously by
radiology nursing throughout the procedure under my direct
supervision.

COMPLICATIONS:
None immediate.
The operative was prepped and draped in the usual sterile fashion,
and a sterile drape was applied covering the operative field. A
timeout was performed prior to the initiation of the procedure.
Local anesthesia was provided with 1% lidocaine with epinephrine.

Under direct ultrasound guidance, an 18 gauge core needle device was
utilized to obtain to obtain 8 core needle biopsies of the right
neck mass/nodal conglomeration.

The samples were placed in saline and submitted to pathology. The
needle was removed and hemostasis was achieved with manual
compression. Post procedure scan was negative for significant
hematoma. A dressing was placed. The patient tolerated the procedure
well without immediate postprocedural complication.
IMPRESSION: Technically successful ultrasound guided biopsy of indeterminate
right neck mass/nodal conglomeration.

## 2023-06-27 ENCOUNTER — Encounter: Payer: Self-pay | Admitting: Podiatry

## 2023-06-27 ENCOUNTER — Ambulatory Visit (INDEPENDENT_AMBULATORY_CARE_PROVIDER_SITE_OTHER): Admitting: Podiatry

## 2023-06-27 DIAGNOSIS — M7752 Other enthesopathy of left foot: Secondary | ICD-10-CM

## 2023-06-27 DIAGNOSIS — R809 Proteinuria, unspecified: Secondary | ICD-10-CM | POA: Diagnosis not present

## 2023-06-27 DIAGNOSIS — M19079 Primary osteoarthritis, unspecified ankle and foot: Secondary | ICD-10-CM

## 2023-06-27 DIAGNOSIS — E785 Hyperlipidemia, unspecified: Secondary | ICD-10-CM | POA: Diagnosis not present

## 2023-06-27 DIAGNOSIS — E1129 Type 2 diabetes mellitus with other diabetic kidney complication: Secondary | ICD-10-CM | POA: Diagnosis not present

## 2023-06-27 DIAGNOSIS — M159 Polyosteoarthritis, unspecified: Secondary | ICD-10-CM | POA: Diagnosis not present

## 2023-06-27 NOTE — Progress Notes (Unsigned)
 Chief Complaint  Patient presents with   Tendinitis    Left first toe tendinitis. There is a knot on the toe and it is very sensitive to touch. Last A1c is unknown and not in chart review. No anti coag,     HPI: 78 y.o. male presents today for evaluation of left first toe pain.  It is unchanged from last visit.  States that he has not really been doing any thing to treat it.  Has not been using Voltaren  gel.  Patient is diabetic, unsure of last A1c. History of oropharyngeal cancer.  Past Medical History:  Diagnosis Date   Allergy    Arthritis    Blood transfusion without reported diagnosis    with bleeding colon per pt in the past    BPH (benign prostatic hypertrophy)    Cancer (HCC)    head/neck   Diabetes mellitus    DX  5 YR AGO  Type II   Glaucoma    H/O: GI bleed    History of kidney stones    History of tobacco abuse 11/01/2022   HLD (hyperlipidemia) 09/28/2017   Myocardial infarction (HCC)    in the 1990's    Past Surgical History:  Procedure Laterality Date   COLONOSCOPY     CYSTOSCOPY WITH RETROGRADE PYELOGRAM, URETEROSCOPY AND STENT PLACEMENT Right 05/20/2016   Procedure: CYSTOSCOPY, URETEROSCOPY, TRANSURETHRAL INCISION OF URETHROCELE, BASKET STONE REMOVAL;  Surgeon: Homero Luster, MD;  Location: [facility];  Service: Urology;  Laterality: Right;   Gall Bladder STones removed     left total knee replacement     MULTIPLE EXTRACTIONS WITH ALVEOLOPLASTY N/A 11/20/2020   Procedure: MULTIPLE EXTRACTION WITH ALVEOLOPLASTY;  Surgeon: Rene Carrier, DMD;  Location: [facility];  Service: Dentistry;  Laterality: N/A;   TOTAL KNEE ARTHROPLASTY  04/04/2011   Procedure: TOTAL KNEE ARTHROPLASTY;  Surgeon: Florencia Hunter, MD;  Location: [facility];  Service: Orthopedics;  Laterality: Left;    No Known Allergies  ROS patient denies any nausea, vomiting, fever, chills, chest pain, shortness of breath   Physical Exam: There were no vitals filed for this visit.  General: The patient  is alert and oriented x3 in no acute distress.  Dermatology: Skin is warm, dry and supple bilateral lower extremities. Interspaces are clear of maceration and debris.    Vascular: Palpable pedal pulses bilaterally. Capillary refill within normal limits.  No appreciable edema.  No erythema or calor.  Neurological: Light touch sensation grossly intact bilateral feet.  Protective sensation decreased  Musculoskeletal Exam: Localized edema left first toe IPJ level.  Pain with dorsal palpation and with range of motion of the first toe at the interphalangeal joint.  Radiographs: Left foot 3 views weightbearing 06/06/2023 Normal osseous mineralization.  No acute osseous fractures or acute osseous abnormalities.  Joint spaces preserved overall.  Bipartite tibial sesamoid noted.  Assessment/Plan of Care: 1. Capsulitis of toe of left foot      No orders of the defined types were placed in this encounter.  None  Discussed clinical findings with patient today.  Again reviewed radiographs reviewed with patient.  No significant arthritis of first IPJ or significant spurring noted.    Does have localized inflammation over the first toe on exam.  Offered corticosteroid injection today which the patient deferred.  I encouraged regular use of the Voltaren  gel and stiff soled shoe.  Explained that we need good effort with conservative care before considering surgery.  He does  also have diabetes and would need some lab workup to ascertain A1c, vitamin D levels and more surgery which is seriously be considered. Does report prior MI several years ago. Patient will follow-up in approximately 1 month.  Will encourage corticosteroid injection receiving injection under control if this is still problematic for the patient at next visit.   Kennith Morss L. Lunda Salines, AACFAS [facility]     [address] [place], [place] [address]                Office [phone]   Fax [phone]

## 2023-06-29 DIAGNOSIS — C109 Malignant neoplasm of oropharynx, unspecified: Secondary | ICD-10-CM | POA: Diagnosis not present

## 2023-06-29 DIAGNOSIS — M47816 Spondylosis without myelopathy or radiculopathy, lumbar region: Secondary | ICD-10-CM | POA: Diagnosis not present

## 2023-06-29 DIAGNOSIS — Z79899 Other long term (current) drug therapy: Secondary | ICD-10-CM | POA: Diagnosis not present

## 2023-06-29 DIAGNOSIS — M170 Bilateral primary osteoarthritis of knee: Secondary | ICD-10-CM | POA: Diagnosis not present

## 2023-07-18 DIAGNOSIS — M1711 Unilateral primary osteoarthritis, right knee: Secondary | ICD-10-CM | POA: Diagnosis not present

## 2023-07-18 DIAGNOSIS — M25561 Pain in right knee: Secondary | ICD-10-CM | POA: Diagnosis not present

## 2023-07-18 DIAGNOSIS — R262 Difficulty in walking, not elsewhere classified: Secondary | ICD-10-CM | POA: Diagnosis not present

## 2023-07-18 DIAGNOSIS — M25661 Stiffness of right knee, not elsewhere classified: Secondary | ICD-10-CM | POA: Diagnosis not present

## 2023-07-25 ENCOUNTER — Ambulatory Visit (INDEPENDENT_AMBULATORY_CARE_PROVIDER_SITE_OTHER): Admitting: Podiatry

## 2023-07-25 ENCOUNTER — Encounter: Payer: Self-pay | Admitting: Podiatry

## 2023-07-25 DIAGNOSIS — M19079 Primary osteoarthritis, unspecified ankle and foot: Secondary | ICD-10-CM

## 2023-07-25 MED ORDER — MELOXICAM 15 MG PO TABS: 15.0000 mg | ORAL_TABLET | Freq: Every day | ORAL | 0 refills | Status: AC

## 2023-07-25 MED ORDER — IBUPROFEN 600 MG PO TABS: 600.0000 mg | ORAL_TABLET | Freq: Four times a day (QID) | ORAL | 1 refills | Status: DC | PRN

## 2023-07-25 NOTE — Progress Notes (Signed)
 Chief Complaint  Patient presents with   Arthritis    Left first toe arthritis. Sometime it just bothers him. The knot on the toe is about the same in size. A1c is unknown, no anti coag.     HPI: 78 y.o. male presents today for evaluation of left first toe pain.  Patient reports that is improved since last visit.  It is less swollen and less irritated.  Past Medical History:  Diagnosis Date   Allergy    Arthritis    Blood transfusion without reported diagnosis    with bleeding colon per pt in the past    BPH (benign prostatic hypertrophy)    Cancer (HCC)    head/neck   Diabetes mellitus    DX  5 YR AGO  Type II   Glaucoma    H/O: GI bleed    History of kidney stones    History of tobacco abuse 11/01/2022   HLD (hyperlipidemia) 09/28/2017   Myocardial infarction (HCC)    in the 1990's    Past Surgical History:  Procedure Laterality Date   COLONOSCOPY     CYSTOSCOPY WITH RETROGRADE PYELOGRAM, URETEROSCOPY AND STENT PLACEMENT Right 05/20/2016   Procedure: CYSTOSCOPY, URETEROSCOPY, TRANSURETHRAL INCISION OF URETHROCELE, BASKET STONE REMOVAL;  Surgeon: Homero Luster, MD;  Location: [facility];  Service: Urology;  Laterality: Right;   Gall Bladder STones removed     left total knee replacement     MULTIPLE EXTRACTIONS WITH ALVEOLOPLASTY N/A 11/20/2020   Procedure: MULTIPLE EXTRACTION WITH ALVEOLOPLASTY;  Surgeon: Rene Carrier, DMD;  Location: [facility];  Service: Dentistry;  Laterality: N/A;   TOTAL KNEE ARTHROPLASTY  04/04/2011   Procedure: TOTAL KNEE ARTHROPLASTY;  Surgeon: Florencia Hunter, MD;  Location: [facility];  Service: Orthopedics;  Laterality: Left;    No Known Allergies  ROS patient denies any nausea, vomiting, fever, chills, chest pain, shortness of breath   Physical Exam: There were no vitals filed for this visit.  General: The patient is alert and oriented x3 in no acute distress.  Dermatology: Skin is warm, dry and supple bilateral lower extremities. Interspaces  are clear of maceration and debris.    Vascular: Palpable pedal pulses bilaterally. Capillary refill within normal limits.  No appreciable edema.  No erythema or calor.  Neurological: Light touch sensation grossly intact bilateral feet.  Protective sensation decreased  Musculoskeletal Exam: Decreased edema about the left first toe interphalangeal joint.  Decreased tenderness on palpation.  There are some tenderness on palpation of extensor tendon  Radiographs: Left foot 3 views weightbearing 06/06/2023 Normal osseous mineralization.  No acute osseous fractures or acute osseous abnormalities.  Joint spaces preserved overall.  Bipartite tibial sesamoid noted.  Assessment/Plan of Care: 1. Arthritis of big toe      Meds ordered this encounter  Medications   DISCONTD: ibuprofen  (ADVIL ) 600 MG tablet    Sig: Take 1 tablet (600 mg total) by mouth every 6 (six) hours as needed for up to 21 days for mild pain (pain score 1-3) or moderate pain (pain score 4-6).    Dispense:  30 tablet    Refill:  1   meloxicam  (MOBIC ) 15 MG tablet    Sig: Take 1 tablet (15 mg total) by mouth daily.    Dispense:  30 tablet    Refill:  0   None  Discussed clinical findings with patient today.  Localized tenderness over the interphalangeal joint has decreased.  There is still some  tenderness over the extensor tendon.  Overall pain is improved.  Patient would like to defer surgery.  Dispensed gel toe cap.  Oral meloxicam  sent to patient's pharmacy.   Richy Spradley L. Lunda Salines, AACFAS [facility]     [address] [place], [place] [address]                Office [phone]  Fax [phone]

## 2023-07-26 DIAGNOSIS — M25561 Pain in right knee: Secondary | ICD-10-CM | POA: Diagnosis not present

## 2023-07-26 DIAGNOSIS — M25661 Stiffness of right knee, not elsewhere classified: Secondary | ICD-10-CM | POA: Diagnosis not present

## 2023-07-26 DIAGNOSIS — R262 Difficulty in walking, not elsewhere classified: Secondary | ICD-10-CM | POA: Diagnosis not present

## 2023-07-26 DIAGNOSIS — M1711 Unilateral primary osteoarthritis, right knee: Secondary | ICD-10-CM | POA: Diagnosis not present

## 2023-07-31 DIAGNOSIS — Z79899 Other long term (current) drug therapy: Secondary | ICD-10-CM | POA: Diagnosis not present

## 2023-07-31 DIAGNOSIS — M129 Arthropathy, unspecified: Secondary | ICD-10-CM | POA: Diagnosis not present

## 2023-07-31 DIAGNOSIS — M170 Bilateral primary osteoarthritis of knee: Secondary | ICD-10-CM | POA: Diagnosis not present

## 2023-07-31 DIAGNOSIS — M47816 Spondylosis without myelopathy or radiculopathy, lumbar region: Secondary | ICD-10-CM | POA: Diagnosis not present

## 2023-07-31 DIAGNOSIS — C109 Malignant neoplasm of oropharynx, unspecified: Secondary | ICD-10-CM | POA: Diagnosis not present

## 2023-07-31 DIAGNOSIS — R030 Elevated blood-pressure reading, without diagnosis of hypertension: Secondary | ICD-10-CM | POA: Diagnosis not present

## 2023-08-02 DIAGNOSIS — M25561 Pain in right knee: Secondary | ICD-10-CM | POA: Diagnosis not present

## 2023-08-02 DIAGNOSIS — M25661 Stiffness of right knee, not elsewhere classified: Secondary | ICD-10-CM | POA: Diagnosis not present

## 2023-08-02 DIAGNOSIS — M1711 Unilateral primary osteoarthritis, right knee: Secondary | ICD-10-CM | POA: Diagnosis not present

## 2023-08-02 DIAGNOSIS — R262 Difficulty in walking, not elsewhere classified: Secondary | ICD-10-CM | POA: Diagnosis not present

## 2023-08-11 DIAGNOSIS — M25561 Pain in right knee: Secondary | ICD-10-CM | POA: Diagnosis not present

## 2023-08-11 DIAGNOSIS — M25661 Stiffness of right knee, not elsewhere classified: Secondary | ICD-10-CM | POA: Diagnosis not present

## 2023-08-11 DIAGNOSIS — M1711 Unilateral primary osteoarthritis, right knee: Secondary | ICD-10-CM | POA: Diagnosis not present

## 2023-08-11 DIAGNOSIS — R262 Difficulty in walking, not elsewhere classified: Secondary | ICD-10-CM | POA: Diagnosis not present

## 2023-08-29 ENCOUNTER — Ambulatory Visit: Admitting: Podiatry

## 2023-08-29 ENCOUNTER — Encounter: Payer: Self-pay | Admitting: Podiatry

## 2023-08-29 DIAGNOSIS — M79676 Pain in unspecified toe(s): Secondary | ICD-10-CM | POA: Diagnosis not present

## 2023-08-29 DIAGNOSIS — E119 Type 2 diabetes mellitus without complications: Secondary | ICD-10-CM | POA: Diagnosis not present

## 2023-08-29 DIAGNOSIS — B351 Tinea unguium: Secondary | ICD-10-CM | POA: Diagnosis not present

## 2023-08-29 NOTE — Progress Notes (Unsigned)
  Subjective:  Patient ID: David Kelly, male    DOB: [birth year],  MRN: [id number]  Chief Complaint  Patient presents with   Diabetes    At risk foot care -5 week follow up   Ingrown Toenail    Right great toenail feels ingrown (lateral) - nails need cutting    78 y.o. male presents with the above complaint. History confirmed with patient. Patient presenting with pain related to dystrophic thickened elongated nails. Patient is unable to trim own nails related to nail dystrophy and/or mobility issues. Patient does have a history of T2DM.  Denies any calluses today.  Does have some concern for the right first toenail.  Objective:  Physical Exam: warm, good capillary refill nail exam onychomycosis of the toenails, onycholysis, and dystrophic nails DP pulses palpable, PT pulses palpable, and protective sensation absent Left Foot:  Pain with palpation of nails due to elongation and dystrophic growth.  Left first toe dorsal IPJ level there is some tenderness on palpation Right Foot: Pain with palpation of nails due to elongation and dystrophic growth.  No tenderness on palpation of right hallux nail borders, does have an area of the right first toe lateral border where the patient previously trimmed laterally, does not appear to be ingrowing at this time.  Assessment:   1. Diabetes mellitus without complication (HCC)   2. Pain due to onychomycosis of toenail      Plan:  Patient was evaluated and treated and all questions answered.  #Onychomycosis with pain  -Nails palliatively debrided as below. -Educated on self-care - Monitor for signs of ingrowing toenail, can follow-up as needed for this if this develops  Procedure: Nail Debridement Rationale: Pain Type of Debridement: manual, sharp debridement. Instrumentation: Nail nipper, rotary burr. Number of Nails: 10  Patient educated on diabetes. Discussed proper diabetic foot care and discussed risks and complications of disease.  Educated patient in depth on reasons to return to the office immediately should he/she discover anything concerning or new on the feet. All questions answered. Discussed proper shoes as well.    Return in about 3 months (around 11/29/2023) for Diabetic Foot Care.         Ethan Saddler, DPM [facility] / [facility]

## 2023-10-26 ENCOUNTER — Telehealth: Payer: Self-pay

## 2023-10-26 ENCOUNTER — Inpatient Hospital Stay: Attending: Oncology

## 2023-10-26 ENCOUNTER — Ambulatory Visit (INDEPENDENT_AMBULATORY_CARE_PROVIDER_SITE_OTHER)
Admission: RE | Admit: 2023-10-26 | Discharge: 2023-10-26 | Disposition: A | Discharge status: Home / Self Care | Source: Ambulatory Visit | Attending: Oncology | Admitting: Oncology

## 2023-10-26 DIAGNOSIS — C109 Malignant neoplasm of oropharynx, unspecified: Secondary | ICD-10-CM

## 2023-10-26 DIAGNOSIS — Z923 Personal history of irradiation: Secondary | ICD-10-CM | POA: Insufficient documentation

## 2023-10-26 DIAGNOSIS — Z85818 Personal history of malignant neoplasm of other sites of lip, oral cavity, and pharynx: Secondary | ICD-10-CM | POA: Insufficient documentation

## 2023-10-26 DIAGNOSIS — D539 Nutritional anemia, unspecified: Secondary | ICD-10-CM | POA: Insufficient documentation

## 2023-10-26 DIAGNOSIS — Z9221 Personal history of antineoplastic chemotherapy: Secondary | ICD-10-CM | POA: Diagnosis not present

## 2023-10-26 DIAGNOSIS — Z79899 Other long term (current) drug therapy: Secondary | ICD-10-CM | POA: Diagnosis not present

## 2023-10-26 LAB — CMP (CANCER CENTER ONLY)
ALT: 14 U/L (ref 0–44)
AST: 16 U/L (ref 15–41)
Albumin: 4.1 g/dL (ref 3.5–5.0)
Alkaline Phosphatase: 72 U/L (ref 38–126)
Anion gap: 15 (ref 5–15)
BUN: 17 mg/dL (ref 8–23)
CO2: 22 mmol/L (ref 22–32)
Calcium: 9.5 mg/dL (ref 8.9–10.3)
Chloride: 102 mmol/L (ref 98–111)
Creatinine: 0.93 mg/dL (ref 0.61–1.24)
GFR, Estimated: >60 mL/min (ref >60)
Glucose, Bld: 175 mg/dL — ABNORMAL HIGH (ref 70–99)
Potassium: 3.9 mmol/L (ref 3.5–5.1)
Sodium: 139 mmol/L (ref 135–145)
Total Bilirubin: 0.4 mg/dL (ref 0.0–1.2)
Total Protein: 7.2 g/dL (ref 6.5–8.1)

## 2023-10-26 LAB — CBC WITH DIFFERENTIAL (CANCER CENTER ONLY)
Abs Immature Granulocytes: 0.03 K/uL (ref 0.00–0.07)
Basophils Absolute: 0 K/uL (ref 0.0–0.1)
Basophils Relative: 0 %
Eosinophils Absolute: 0.2 K/uL (ref 0.0–0.5)
Eosinophils Relative: 2 %
HCT: 34.3 % — ABNORMAL LOW (ref 39.0–52.0)
Hemoglobin: 10.9 g/dL — ABNORMAL LOW (ref 13.0–17.0)
Immature Granulocytes: 0 %
Lymphocytes Relative: 12 %
Lymphs Abs: 0.9 K/uL (ref 0.7–4.0)
MCH: 27 pg (ref 26.0–34.0)
MCHC: 31.8 g/dL (ref 30.0–36.0)
MCV: 84.9 fL (ref 80.0–100.0)
Monocytes Absolute: 0.5 K/uL (ref 0.1–1.0)
Monocytes Relative: 7 %
Neutro Abs: 6.3 K/uL (ref 1.7–7.7)
Neutrophils Relative %: 79 %
Platelet Count: 268 K/uL (ref 150–400)
RBC: 4.04 MIL/uL — ABNORMAL LOW (ref 4.22–5.81)
RDW: 14.5 % (ref 11.5–15.5)
WBC Count: 7.9 K/uL (ref 4.0–10.5)
nRBC: 0 % (ref 0.0–0.2)

## 2023-10-26 MED ORDER — IOHEXOL 300 MG/ML  SOLN
100.0000 mL | Freq: Once | INTRAMUSCULAR | Status: AC | PRN
  Administered 2023-10-26: 75 mL via INTRAVENOUS

## 2023-10-26 NOTE — Telephone Encounter (Signed)
 He did not get gas card today, he had already left.  He will be back on 11/02/2023 to see Dr. Cornelius.

## 2023-10-26 NOTE — Telephone Encounter (Signed)
-----   Message from Bellwood K sent at 10/26/2023  3:15 PM EDT ----- Regarding: RE: gass card Yes, you sure can. ----- Message ----- From: David Falling, RN Sent: 10/26/2023   2:36 PM EDT To: David Kelly Subject: RE: gass card                                  Can I give him one today? ----- Message ----- From: Kelly David JINNY Sent: 10/26/2023   2:21 PM EDT To: Kelly Sheron, RN Subject: RE: gass card                                  He sure can.  Is he getting the card today or on the 18th? ----- Message ----- From: David Falling, RN Sent: 10/26/2023   2:03 PM EDT To: David Kelly Subject: gass card                                      [name] was in today for labs prior to ordered CT scan.  He will be returning on 9/18 for a follow up with Dr. Cornelius.  He asked me about the possibility of getting a gas card, would that be possible?

## 2023-11-02 ENCOUNTER — Encounter: Payer: Self-pay | Admitting: Dietician

## 2023-11-02 ENCOUNTER — Inpatient Hospital Stay (HOSPITAL_BASED_OUTPATIENT_CLINIC_OR_DEPARTMENT_OTHER): Admitting: Oncology

## 2023-11-02 ENCOUNTER — Other Ambulatory Visit: Payer: Self-pay | Admitting: Oncology

## 2023-11-02 VITALS — BP 121/73 | HR 100 | Temp 98.0°F | Resp 18 | Ht 68.0 in | Wt 183.8 lb

## 2023-11-02 DIAGNOSIS — C109 Malignant neoplasm of oropharynx, unspecified: Secondary | ICD-10-CM | POA: Diagnosis not present

## 2023-11-02 DIAGNOSIS — C770 Secondary and unspecified malignant neoplasm of lymph nodes of head, face and neck: Secondary | ICD-10-CM

## 2023-11-02 DIAGNOSIS — Z85818 Personal history of malignant neoplasm of other sites of lip, oral cavity, and pharynx: Secondary | ICD-10-CM | POA: Diagnosis not present

## 2023-11-02 NOTE — Progress Notes (Signed)
 [facility]  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  He completed concurrent chemoradiation with weekly cisplatin  in December 2022.  His weight has improved.  He remains without clinical evidence of recurrence.  CT neck from September 2025 shows post-treatment changes.  Deficiency anemia Chronic anemia. S/P IV Feraheme  in October 2023. B-12 was low at 197, but did not get B12 started. Hemoglobin from September 2024 was stable.  He does not wish to receive B12 injections and so was instructed to take oral B-12.  He also continues iron supplement. His hemoglobin has dropped from 11.9 to 10.9, and so I once again instructed him to get B-12 and wrote the instructions down.   Cough Patient reports dry cough. Chest x-ray did not reveal any active cardiopulmonary disease. I have stopped his Robitussin AC   History of tobacco abuse The patient reports smoking 1/2 ppd cigarettes for 15 years. He states he quit smoking about 5 years ago. Per his chart he has over a 20 pack year history of smoking.  His last CT chest in February 2016 as a 28-month follow up of a right middle lobe nodule revealed stable probable perifissural lymph node abutting the minor fissure of the right middle lobe measuring only 5 mm. No additional pulmonary nodule seen. Mild changes of centrilobular emphysema. Single gallstone within the gallbladder. PET/CT in September 2022 did not reveal any concerning nodules or hypermetabolic activity in the lung. Chest x-ray in December was negative.  Plan: Patient states that he feels fair and complains of right leg pain rated 8/10. He states that his cough and congestion persists and inquired about guaifenesin -codeine  syrup. I declined to do so. I  recommended for him to use an OTC cough syrup as needed. He continues oral iron supplements daily without difficulty. He is not taking B-12 despite prior instructions to do so. He had a CT soft tissue neck performed on 10/26/2023 which revealed no abnormal masses or adenopathy. His 10/26/2023 CBC showed a WBC of 7.9, a low hemoglobin of 10.9 down from 11.9, and platelet count of 268,000. His CMP on 10/26/2023 was normal. I instructed him to begin taking 500 mcg B12 daily and we will recheck his levels the next time I see him. I will see him back in 6 months with CBC, CMP, iron, TIBC, ferritin, and B-12. We will include a CT chest next year when he next has his CT neck. The patient understands the plans discussed today and is in agreement with them.  He knows to contact our office if he develops concerns prior to his next appointment.  I provided 15 minutes of face-to-face time during this encounter and > 50% was spent counseling as documented under my assessment and plan.   Wanda Cornish MD [facility] [address] [place] [address] Dept: [phone] Dept Fax: [phone]   No orders of the defined types were placed in this encounter.   CHIEF COMPLAINT:  CC: Stage I HPV mediated oropharyngeal cancer  Current Treatment: Surveillance  HISTORY OF PRESENT ILLNESS:  David Kelly is a 78 y.o. 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 The  patient was able to palpate swelling of the right side of his neck. Dr. Vaughan Ricker, otolaryngology, performed an FNA. Pathology from this procedure revealed rare atypical cells of undermined significance. Repeat FNA was performed in August revealed acute and chronic inflammation, with no malignant cell identified.  Excisional right neck lymph node biopsy was done in September.  Final pathology confirmed metastatic  squamous cell carcinoma associated with scanty lymphoid tissue.  Laryngoscopy that revealed a mass involving the right lateral pharyngeal wall inferior to the tonsillar region and extending to the midline of the vallecular.  PET revealed increased radiotracer uptake in the patient's right hypopharynx, with a 3.2 cm solitary hypermetabolic metastatic lymph node in the right level 2 nodal region. No distant metastatic disease was seen. He was seen in consultation by Dr. Izell and Dr. Lonn and concurrent chemoradiation with weekly cisplatin  was recommended. As the patient lives locally, he transferred his care to our office.  He received concurrent chemoradiation with weekly cisplatin  completed in December 2022.    CT neck in February 2023 revealed enhancement previous tumor, but otherwise no evidence of disease.  He saw Dr. Ricker in April 2023 and no evidence of persistent or recurrent disease was seen on direct examination.  CT neck in August and March did not reveal any evidence of recurrence. 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 CT neck in March of 2024 revealed resolving posttreatment changes without evidence of local recurrence or metastatic disease in the neck with mild central lobar emphysema the upper chest.  He also follows up with Dr. Ricker, ENT..   Oncology History Overview Note  Immunohistochemistry for p16 is strongly positive.    Oropharyngeal cancer (HCC)  09/10/2020 Initial Diagnosis   He presented with a palpable knot in his upper right neck to Dr. Ricker on 09/10/20. Patient denied any pain at that time, though did report some difficulty swallowing.     10/20/2020 Pathology Results   FINAL MICROSCOPIC DIAGNOSIS:   A. LYMPH NODE, RIGHT NECK, NEEDLE CORE BIOPSY:  - Metastatic squamous cell carcinoma associated with scanty lymphoid tissue.   10/20/2020 Procedure   Technically  successful ultrasound guided biopsy of indeterminate right neck mass/nodal conglomeration.   10/22/2020 Procedure   The fiberoptic laryngoscope was then placed through the nasal passage to view the pharynx and larynx. After completion, the telescope was removed. Findings included normal nasal passages and no mass or abnormality in the nasopharynx. There is a granular mass involving the right lateral pharyngeal wall inferior to tonsillar area and extending to the midline of the vallecula. The larynx appears normal. Pyriform sinuses are open. Secretions are minimal. Vocal folds are without mass, scarring, or ulceration. The vocal folds adduct and abduct symmetrically. There is good glottal closure. Muscle tension patterns are not present. Laryngeal edema is minimal.    11/11/2020 PET scan   1. Hypermetabolic mass in the RIGHT hypopharynx localizing to the RIGHT tonsillar pillar and vallecula. 2. Solitary hypermetabolic metastatic lymph node to the RIGHT level II nodal station. 3. No distant metastatic disease.   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HCC)   11/16/2020 Cancer Staging   Staging form: Pharynx - HPV-Mediated Oropharynx, AJCC 8th Edition - Clinical stage from 11/16/2020: Stage I (cT2, cN1, cM0, p16+) - Signed by Izell Domino, MD on 11/16/2020 Stage prefix: Initial diagnosis   12/22/2020 - 02/01/2021 Chemotherapy  Patient is on Treatment Plan : HEAD/NECK Cisplatin  q7d      INTERVAL HISTORY:  David Kelly is here today for repeat clinical assessment stage I HPV mediated oropharyngeal cancer. Patient states that he feels fair and complains of right leg pain rated 8/10. He states that his cough and congestion persists and inquired about guaifenesin -codeine  syrup. I declined to do so. I recommended for him to use an OTC cough syrup as needed. He continues oral iron supplements daily without difficulty. He is not taking B-12 despite prior instructions to do so. He had a  CT soft tissue neck performed on 10/26/2023 which revealed no abnormal masses or adenopathy. His 10/26/2023 CBC showed a WBC of 7.9, a low hemoglobin of 10.9 down from 11.9, and platelet count of 268,000. His CMP on 10/26/2023 was normal. I instructed him to begin taking 500 mcg B12 daily and we will recheck his levels the next time I see him. I will see him back in 6 months with CBC, CMP, iron, TIBC, ferritin, and B-12. When w e repeat his CT neck next year, we will also include CT chest to follow up on nodules.  He denies fever, chills, night sweats, or other signs of infection. He denies cardiorespiratory and gastrointestinal issues. His appetite is okay and His weight has decreased 8 pounds over last 6 months.  REVIEW OF SYSTEMS:  Review of Systems  Constitutional:  Positive for fatigue. Negative for appetite change, chills, diaphoresis, fever and unexpected weight change.  HENT:  Negative.  Negative for hearing loss, lump/mass, mouth sores, nosebleeds, sore throat, tinnitus, trouble swallowing and voice change.   Eyes: Negative.  Negative for eye problems and icterus.  Respiratory:  Positive for cough (chronic congestion). Negative for chest tightness, hemoptysis, shortness of breath and wheezing.   Cardiovascular: Negative.  Negative for chest pain, leg swelling and palpitations.  Gastrointestinal: Negative.  Negative for abdominal distention, abdominal pain, blood in stool, constipation, diarrhea, nausea, rectal pain and vomiting.  Endocrine: Negative.  Negative for hot flashes.  Genitourinary: Negative.  Negative for bladder incontinence, difficulty urinating, dyspareunia, dysuria, frequency, hematuria, nocturia, pelvic pain and penile discharge.   Musculoskeletal:  Positive for arthralgias (Right arm and knee (8/10), chronic) and gait problem (ambulates with cane). Negative for back pain, flank pain, myalgias, neck pain and neck stiffness.  Skin: Negative.  Negative for itching, rash and wound.   Neurological:  Positive for gait problem (ambulates with cane). Negative for dizziness, extremity weakness, headaches, light-headedness, numbness, seizures and speech difficulty.  Hematological: Negative.  Negative for adenopathy. Does not bruise/bleed easily.  Psychiatric/Behavioral: Negative.  Negative for confusion, decreased concentration, depression, sleep disturbance and suicidal ideas. The patient is not nervous/anxious.    VITALS:  Blood pressure 121/73, pulse 100, temperature 98 F (36.7 C), temperature source Oral, resp. rate 18, height 5' 8 (1.727 m), weight 183 lb 12.8 oz (83.4 kg), SpO2 100%.  Wt Readings from Last 3 Encounters:  11/02/23 183 lb 12.8 oz (83.4 kg)  05/02/23 191 lb 6.4 oz (86.8 kg)  02/01/23 187 lb 8 oz (85 kg)    Body mass index is 27.95 kg/m.  Performance status (ECOG): 1 - Symptomatic but completely ambulatory  PHYSICAL EXAM:  Physical Exam Vitals and nursing note reviewed.  Constitutional:      General: He is not in acute distress.    Appearance: Normal appearance. He is normal weight. He is not ill-appearing, toxic-appearing or diaphoretic.  HENT:     Head: Normocephalic and atraumatic.  Right Ear: Tympanic membrane, ear canal and external ear normal. There is no impacted cerumen.     Left Ear: Tympanic membrane, ear canal and external ear normal. There is no impacted cerumen.     Nose: Nose normal. No congestion or rhinorrhea.     Mouth/Throat:     Lips: No lesions.     Mouth: Mucous membranes are moist. No oral lesions.     Dentition: Has dentures (Upper and lower).     Tongue: No lesions. Tongue does not deviate from midline.     Palate: No mass and lesions.     Pharynx: Oropharynx is clear. No oropharyngeal exudate or posterior oropharyngeal erythema.  Eyes:     General: No scleral icterus.       Right eye: No discharge.        Left eye: No discharge.     Extraocular Movements: Extraocular movements intact.     Conjunctiva/sclera:  Conjunctivae normal.     Pupils: Pupils are equal, round, and reactive to light.  Neck:     Vascular: No carotid bruit.  Cardiovascular:     Rate and Rhythm: Regular rhythm. Tachycardia present.     Pulses: Normal pulses.     Heart sounds: Normal heart sounds. No murmur heard.    No friction rub. No gallop.  Pulmonary:     Effort: Pulmonary effort is normal. No respiratory distress.     Breath sounds: No stridor. Examination of the right-upper field reveals wheezing. Examination of the right-lower field reveals wheezing. Examination of the left-lower field reveals decreased breath sounds. Decreased breath sounds and wheezing present. No rhonchi or rales.     Comments: Expiratory wheezing on the right upper and lower lung Chest:     Chest wall: No tenderness.  Abdominal:     General: Bowel sounds are normal. There is no distension.     Palpations: Abdomen is soft. There is no hepatomegaly, splenomegaly or mass.     Tenderness: There is no abdominal tenderness. There is no right CVA tenderness, left CVA tenderness, guarding or rebound.     Hernia: No hernia is present.  Musculoskeletal:        General: No swelling, tenderness, deformity or signs of injury. Normal range of motion.     Cervical back: Normal range of motion and neck supple. No rigidity or tenderness.     Right lower leg: No edema.     Left lower leg: No edema.  Lymphadenopathy:     Cervical: No cervical adenopathy.     Upper Body:     Right upper body: No supraclavicular or axillary adenopathy.     Left upper body: No supraclavicular or axillary adenopathy.  Skin:    General: Skin is warm and dry.     Coloration: Skin is not jaundiced or pale.     Findings: No bruising, erythema, lesion or rash.  Neurological:     General: No focal deficit present.     Mental Status: He is alert and oriented to person, place, and time. Mental status is at baseline.     Cranial Nerves: No cranial nerve deficit.     Sensory: No sensory  deficit.     Motor: No weakness.     Coordination: Coordination normal.     Gait: Gait normal.     Deep Tendon Reflexes: Reflexes normal.  Psychiatric:        Mood and Affect: Mood normal.        Behavior: Behavior  normal.        Thought Content: Thought content normal.        Judgment: Judgment normal.    LABS:      Latest Ref Rng & Units 10/26/2023    1:17 PM 05/02/2023   12:59 PM 02/01/2023   11:47 AM  CBC  WBC 4.0 - 10.5 K/uL 7.9  6.2  6.5   Hemoglobin 13.0 - 17.0 g/dL 89.0  88.0  87.8   Hematocrit 39.0 - 52.0 % 34.3  38.1  37.8   Platelets 150 - 400 K/uL 268  217  246       Latest Ref Rng & Units 10/26/2023    1:17 PM 05/02/2023   12:59 PM 02/01/2023   11:47 AM  CMP  Glucose 70 - 99 mg/dL 824  803  876   BUN 8 - 23 mg/dL 17  13  12    Creatinine 0.61 - 1.24 mg/dL 9.06  8.91  9.14   Sodium 135 - 145 mmol/L 139  142  142   Potassium 3.5 - 5.1 mmol/L 3.9  3.8  4.2   Chloride 98 - 111 mmol/L 102  104  105   CO2 22 - 32 mmol/L 22  24  24    Calcium  8.9 - 10.3 mg/dL 9.5  9.2  9.8   Total Protein 6.5 - 8.1 g/dL 7.2  7.1  7.3   Total Bilirubin 0.0 - 1.2 mg/dL 0.4  0.3  0.4   Alkaline Phos 38 - 126 U/L 72  86  84   AST 15 - 41 U/L 16  22  16    ALT 0 - 44 U/L 14  21  8     No results found for: CEA1, CEA / No results found for: CEA1, CEA No results found for: PSA1 No results found for: CAN199 No results found for: CAN125  No results found for: STEPHANY RINGS, A1GS, A2GS, EARLA JOANNIE KNIGHTS, MSPIKE, SPEI Lab Results  Component Value Date   TIBC 368 05/02/2023   TIBC 392 02/01/2023   TIBC 361 07/22/2022   FERRITIN 38 05/02/2023   FERRITIN 37 02/01/2023   FERRITIN 29 07/22/2022   IRONPCTSAT 23 05/02/2023   IRONPCTSAT 23 02/01/2023   IRONPCTSAT 12 (L) 07/22/2022   Lab Results  Component Value Date   FOLATE 7.5 05/02/2023   Lab Results  Component Value Date   VITAMINB12 189 05/02/2023   No results found for:  LDH  STUDIES:  EXAM: 10/26/2023 CT NECK WITH CONTRAST IMPRESSION: No abnormal mass or adenopathy  HISTORY:   Past Medical History:  Diagnosis Date   Allergy    Arthritis    Blood transfusion without reported diagnosis    with bleeding colon per pt in the past    BPH (benign prostatic hypertrophy)    Cancer (HCC)    head/neck   Diabetes mellitus    DX  5 YR AGO  Type II   Glaucoma    H/O: GI bleed    History of kidney stones    History of tobacco abuse 11/01/2022   HLD (hyperlipidemia) 09/28/2017   Myocardial infarction (HCC)    in the 1990's    Past Surgical History:  Procedure Laterality Date   COLONOSCOPY     CYSTOSCOPY WITH RETROGRADE PYELOGRAM, URETEROSCOPY AND STENT PLACEMENT Right 05/20/2016   Procedure: CYSTOSCOPY, URETEROSCOPY, TRANSURETHRAL INCISION OF URETHROCELE, BASKET STONE REMOVAL;  Surgeon: Norleen Seltzer, MD;  Location: [facility];  Service: Urology;  Laterality: Right;   Gall Bladder  STones removed     left total knee replacement     MULTIPLE EXTRACTIONS WITH ALVEOLOPLASTY N/A 11/20/2020   Procedure: MULTIPLE EXTRACTION WITH ALVEOLOPLASTY;  Surgeon: Celena Lum NOVAK, DMD;  Location: [facility] OR;  Service: Dentistry;  Laterality: N/A;   TOTAL KNEE ARTHROPLASTY  04/04/2011   Procedure: TOTAL KNEE ARTHROPLASTY;  Surgeon: Garnette JONETTA Raman, MD;  Location: [facility] OR;  Service: Orthopedics;  Laterality: Left;    Family History  Problem Relation Age of Onset   Arthritis Mother    Asthma Paternal Grandfather    Diabetes Sister    Colon polyps Brother    Colon cancer Neg Hx    Esophageal cancer Neg Hx    Rectal cancer Neg Hx    Stomach cancer Neg Hx     Social History:  reports that he quit smoking about 17 years ago. His smoking use included cigarettes. He started smoking about 62 years ago. He has a 67.5 pack-year smoking history. He quit smokeless tobacco use about 5 years ago.  His smokeless tobacco use included chew. He reports that he does not drink alcohol and does  not use drugs.The patient is alone  today.  Allergies: No Known Allergies  Current Medications: Current Outpatient Medications  Medication Sig Dispense Refill   ACCU-CHEK GUIDE test strip See admin instructions.     Accu-Chek Softclix Lancets lancets See admin instructions.     atorvastatin  (LIPITOR) 80 MG tablet Take by mouth.     Blood Glucose Monitoring Suppl (ACCU-CHEK GUIDE ME) w/Device KIT See admin instructions.     chlorhexidine  (PERIDEX ) 0.12 % solution 15 mLs 2 (two) times daily.     ezetimibe (ZETIA) 10 MG tablet Take 10 mg by mouth daily.     ferrous sulfate 325 (65 FE) MG tablet Take 325 mg by mouth daily with breakfast. Informed patient to start taking iron supplement every day. 01/11/21     guaiFENesin -codeine  100-10 MG/5ML syrup TAKE 5 MILLILITERS BY MOUTH EVERY 6 HOURS AS NEEDED FOR COUGH 240 mL 0   HYDROcodone -acetaminophen  (NORCO) 10-325 MG tablet Take 1 tablet by mouth every 6 (six) hours as needed.     JARDIANCE 10 MG TABS tablet Take 10 mg by mouth daily.     meloxicam  (MOBIC ) 15 MG tablet Take 1 tablet (15 mg total) by mouth daily. 30 tablet 0   metFORMIN  (GLUCOPHAGE ) 1000 MG tablet Take 1 tablet (1,000 mg total) by mouth 2 (two) times daily with a meal. 180 tablet 3   tamsulosin  (FLOMAX ) 0.4 MG CAPS capsule      No current facility-administered medications for this visit.   I,Colbie Sliker H Alan Drummer,acting as a scribe for Wanda VEAR Cornish, MD.,have documented all relevant documentation on the behalf of Wanda VEAR Cornish, MD,as directed by  Wanda VEAR Cornish, MD while in the presence of Wanda VEAR Cornish, MD.

## 2023-11-13 ENCOUNTER — Encounter: Payer: Self-pay | Admitting: Oncology

## 2023-11-28 ENCOUNTER — Encounter: Payer: Self-pay | Admitting: Oncology

## 2023-11-28 ENCOUNTER — Encounter: Payer: Self-pay | Admitting: Podiatry

## 2023-11-28 ENCOUNTER — Ambulatory Visit: Admitting: Podiatry

## 2023-11-28 DIAGNOSIS — M79675 Pain in left toe(s): Secondary | ICD-10-CM | POA: Diagnosis not present

## 2023-11-28 DIAGNOSIS — M79674 Pain in right toe(s): Secondary | ICD-10-CM | POA: Diagnosis not present

## 2023-11-28 DIAGNOSIS — B351 Tinea unguium: Secondary | ICD-10-CM

## 2023-11-28 DIAGNOSIS — E119 Type 2 diabetes mellitus without complications: Secondary | ICD-10-CM

## 2023-11-28 NOTE — Progress Notes (Unsigned)
  Subjective:  Patient ID: David Kelly, male    DOB: 1945/04/28,  MRN: [id number]  Chief Complaint  Patient presents with   [facility]    [facility] with out callous A1c is unknow, not on lab report that I seen No anti coag.     78 y.o. male presents with the above complaint. History confirmed with patient. Patient presenting with pain related to dystrophic thickened elongated nails. Patient is unable to trim own nails related to nail dystrophy and/or mobility issues. Patient does have a history of T2DM.    Objective:  Physical Exam: warm, good capillary refill nail exam onychomycosis of the toenails, onycholysis, and dystrophic nails DP pulses palpable, PT pulses palpable, and protective sensation absent Left Foot:  Pain with palpation of nails due to elongation and dystrophic growth.  Left first toe dorsal IPJ level there is some tenderness on palpation with prominent spur Right Foot: Pain with palpation of nails due to elongation and dystrophic growth.  No tenderness on palpation of right hallux nail borders, does have an area of the right first toe lateral border where the patient previously trimmed laterally, does not appear to be ingrowing at this time.  Assessment:   1. Diabetes mellitus without complication (HCC)   2. Pain due to onychomycosis of toenails of both feet      Plan:  Patient was evaluated and treated and all questions answered.  #Onychomycosis with pain  -Nails palliatively debrided as below. -Educated on self-care  Procedure: Nail Debridement Rationale: Pain Type of Debridement: manual, sharp debridement. Instrumentation: Nail nipper, rotary burr. Number of Nails: 10   Patient educated on diabetes. Discussed proper diabetic foot care and discussed risks and complications of disease. Educated patient in depth on reasons to return to the office immediately should he/she discover anything concerning or new on the feet. All questions answered. Discussed proper shoes as  well.    Return in about 3 months (around 02/28/2024) for Diabetic Foot Care.         Ethan Saddler, DPM [facility]

## 2023-11-28 NOTE — Patient Instructions (Signed)
 Look for urea 40% cream or ointment and apply to the thickened dry skin / calluses. This can be bought over the counter, at a pharmacy or online such as [employer]. You can use this to massage into the skin fold where the right first toenail is growing out.

## 2024-02-27 ENCOUNTER — Encounter: Payer: Self-pay | Admitting: Podiatry

## 2024-02-27 ENCOUNTER — Ambulatory Visit: Admitting: Podiatry

## 2024-02-27 DIAGNOSIS — M79674 Pain in right toe(s): Secondary | ICD-10-CM

## 2024-02-27 DIAGNOSIS — E119 Type 2 diabetes mellitus without complications: Secondary | ICD-10-CM

## 2024-02-27 DIAGNOSIS — L600 Ingrowing nail: Secondary | ICD-10-CM

## 2024-02-27 DIAGNOSIS — B351 Tinea unguium: Secondary | ICD-10-CM

## 2024-02-27 DIAGNOSIS — M79675 Pain in left toe(s): Secondary | ICD-10-CM

## 2024-02-27 NOTE — Patient Instructions (Signed)

## 2024-02-27 NOTE — Progress Notes (Signed)
"  °  Subjective:  Patient ID: David Kelly, male    DOB: 11-15-1945,  MRN: [id number]  Chief Complaint  Patient presents with   [facility]    DFC, no callus.  A1c is unknown, BS have been running pretty good.  No anti ccoag.    79 y.o. male presents with the above complaint. History confirmed with patient. Patient presenting with pain related to dystrophic thickened elongated nails. Patient is unable to trim own nails related to nail dystrophy and/or mobility issues. Patient does have a history of T2DM.  Reports pain to left first toe lateral nail border in particular today.  Objective:  Physical Exam: warm, good capillary refill nail exam onychomycosis of the toenails, onycholysis, and dystrophic nails greater than 3 mm thickening. DP pulses palpable, PT pulses palpable, and protective sensation absent Left Foot:  Pain with palpation of nails due to elongation and dystrophic growth.  Incurvation and tenderness of left first toe lateral nail border. Right Foot: Pain with palpation of nails due to elongation and dystrophic growth.   Assessment:   1. Pain due to onychomycosis of toenails of both feet   2. Diabetes mellitus without complication (HCC)   3. Ingrown toenail of left foot      Plan:  Patient was evaluated and treated and all questions answered.  #Onychomycosis with pain  -Nails palliatively debrided as below. -Educated on self-care  Procedure: Nail Debridement Rationale: Pain Type of Debridement: manual, sharp debridement. Instrumentation: Nail nipper, rotary burr. Number of Nails: 10   # Ingrown toenail left first toe - Concern for developing ingrown toenail left first toe lateral border.  No signs of acute bacterial infection - Slant back nail trim was performed today. - Did offer partial nail avulsion with chemical matrixectomy today.  He is interested in proceeding with this at a later date.  He will follow-up as needed going forward as he is unsure of his  schedule. - Risk, benefits alternative therapies were discussed.  He would like to proceed going forward - In the meantime apply antibacterial ointment to affected area for 1 to 2 weeks and soak with warm Epsom salts. - Potential for increased wound healing due to diabetes, does appear to be well-controlled per patient. -I certify that this diagnosis represents a distinct and separate diagnosis that requires evaluation and treatment separate from other procedures or diagnosis   Patient educated on diabetes. Discussed proper diabetic foot care and discussed risks and complications of disease. Educated patient in depth on reasons to return to the office immediately should he/she discover anything concerning or new on the feet. All questions answered. Discussed proper shoes as well.    Return in about 3 months (around 05/27/2024) for Diabetic Foot Care.         Ethan Saddler, DPM [facility]                      "

## 2024-05-01 ENCOUNTER — Ambulatory Visit: Admitting: Oncology

## 2024-05-01 ENCOUNTER — Other Ambulatory Visit

## 2024-05-28 ENCOUNTER — Ambulatory Visit: Admitting: Podiatry
